# Patient Record
Sex: Female | Born: 1950 | Race: White | Hispanic: No
Health system: Midwestern US, Community
[De-identification: ages and names within clinical notes are randomized; demographics above are authoritative.]

## PROBLEM LIST (undated history)

## (undated) DIAGNOSIS — E785 Hyperlipidemia, unspecified: Secondary | ICD-10-CM

## (undated) DIAGNOSIS — B019 Varicella without complication: Secondary | ICD-10-CM

## (undated) DIAGNOSIS — T4145XA Adverse effect of unspecified anesthetic, initial encounter: Secondary | ICD-10-CM

## (undated) DIAGNOSIS — M1990 Unspecified osteoarthritis, unspecified site: Secondary | ICD-10-CM

## (undated) DIAGNOSIS — Z8489 Family history of other specified conditions: Secondary | ICD-10-CM

## (undated) DIAGNOSIS — E079 Disorder of thyroid, unspecified: Secondary | ICD-10-CM

## (undated) DIAGNOSIS — R011 Cardiac murmur, unspecified: Secondary | ICD-10-CM

## (undated) DIAGNOSIS — C801 Malignant (primary) neoplasm, unspecified: Secondary | ICD-10-CM

## (undated) DIAGNOSIS — I639 Cerebral infarction, unspecified: Secondary | ICD-10-CM

## (undated) DIAGNOSIS — E039 Hypothyroidism, unspecified: Secondary | ICD-10-CM

## (undated) DIAGNOSIS — Z91018 Allergy to other foods: Secondary | ICD-10-CM

## (undated) DIAGNOSIS — A630 Anogenital (venereal) warts: Secondary | ICD-10-CM

## (undated) DIAGNOSIS — E119 Type 2 diabetes mellitus without complications: Secondary | ICD-10-CM

## (undated) DIAGNOSIS — T8859XA Other complications of anesthesia, initial encounter: Secondary | ICD-10-CM

## (undated) DIAGNOSIS — I10 Essential (primary) hypertension: Secondary | ICD-10-CM

## (undated) DIAGNOSIS — Z95818 Presence of other cardiac implants and grafts: Secondary | ICD-10-CM

## (undated) DIAGNOSIS — E041 Nontoxic single thyroid nodule: Secondary | ICD-10-CM

## (undated) DIAGNOSIS — Z9109 Other allergy status, other than to drugs and biological substances: Secondary | ICD-10-CM

## (undated) HISTORY — DX: Unspecified osteoarthritis, unspecified site: M19.90

## (undated) HISTORY — DX: Cardiac murmur, unspecified: R01.1

## (undated) HISTORY — DX: Allergy to other foods: Z91.018

## (undated) HISTORY — PX: CARPAL TUNNEL RELEASE: SHX101

## (undated) HISTORY — DX: Essential (primary) hypertension: I10

## (undated) HISTORY — DX: Nontoxic single thyroid nodule: E04.1

## (undated) HISTORY — DX: Disorder of thyroid, unspecified: E07.9

## (undated) HISTORY — DX: Hyperlipidemia, unspecified: E78.5

## (undated) HISTORY — DX: Anogenital (venereal) warts: A63.0

## (undated) HISTORY — DX: Varicella without complication: B01.9

## (undated) HISTORY — DX: Other allergy status, other than to drugs and biological substances: Z91.09

## (undated) HISTORY — PX: FRACTURE SURGERY: SHX138

## (undated) HISTORY — PX: CYSTECTOMY: SUR359

---

## 2011-12-27 LAB — HM MAMMOGRAPHY

## 2013-07-16 ENCOUNTER — Encounter: Payer: Self-pay | Admitting: Family Medicine

## 2013-07-16 ENCOUNTER — Ambulatory Visit (INDEPENDENT_AMBULATORY_CARE_PROVIDER_SITE_OTHER): Payer: 59 | Admitting: Family Medicine

## 2013-07-16 VITALS — BP 118/74 | HR 71 | Temp 98.7°F | Ht 61.25 in | Wt 181.0 lb

## 2013-07-16 DIAGNOSIS — M899 Disorder of bone, unspecified: Secondary | ICD-10-CM

## 2013-07-16 DIAGNOSIS — Z1239 Encounter for other screening for malignant neoplasm of breast: Secondary | ICD-10-CM

## 2013-07-16 DIAGNOSIS — E039 Hypothyroidism, unspecified: Secondary | ICD-10-CM

## 2013-07-16 DIAGNOSIS — Z0000 Encounter for general adult medical examination without abnormal findings: Secondary | ICD-10-CM

## 2013-07-16 DIAGNOSIS — J309 Allergic rhinitis, unspecified: Secondary | ICD-10-CM

## 2013-07-16 DIAGNOSIS — M8580 Other specified disorders of bone density and structure, unspecified site: Secondary | ICD-10-CM | POA: Insufficient documentation

## 2013-07-16 DIAGNOSIS — J302 Other seasonal allergic rhinitis: Secondary | ICD-10-CM

## 2013-07-16 DIAGNOSIS — N390 Urinary tract infection, site not specified: Secondary | ICD-10-CM

## 2013-07-16 DIAGNOSIS — Z1231 Encounter for screening mammogram for malignant neoplasm of breast: Secondary | ICD-10-CM

## 2013-07-16 DIAGNOSIS — E669 Obesity, unspecified: Secondary | ICD-10-CM

## 2013-07-16 DIAGNOSIS — Z9109 Other allergy status, other than to drugs and biological substances: Secondary | ICD-10-CM

## 2013-07-16 DIAGNOSIS — I10 Essential (primary) hypertension: Secondary | ICD-10-CM | POA: Insufficient documentation

## 2013-07-16 DIAGNOSIS — Z2911 Encounter for prophylactic immunotherapy for respiratory syncytial virus (RSV): Secondary | ICD-10-CM

## 2013-07-16 LAB — CBC WITH DIFFERENTIAL/PLATELET
Basophils Absolute: 0.1 10*3/uL (ref 0.0–0.1)
HCT: 40.5 % (ref 36.0–46.0)
Lymphs Abs: 2.5 10*3/uL (ref 0.7–4.0)
MCV: 86.3 fl (ref 78.0–100.0)
Monocytes Absolute: 0.5 10*3/uL (ref 0.1–1.0)
Neutro Abs: 4.9 10*3/uL (ref 1.4–7.7)
Platelets: 307 10*3/uL (ref 150.0–400.0)
RDW: 13.4 % (ref 11.5–14.6)

## 2013-07-16 LAB — MICROALBUMIN / CREATININE URINE RATIO
Creatinine,U: 72.2 mg/dL
Microalb Creat Ratio: 1.2 mg/g (ref 0.0–30.0)

## 2013-07-16 LAB — BASIC METABOLIC PANEL
BUN: 15 mg/dL (ref 6–23)
CO2: 29 mEq/L (ref 19–32)
Chloride: 104 mEq/L (ref 96–112)
Glucose, Bld: 86 mg/dL (ref 70–99)
Potassium: 3.9 mEq/L (ref 3.5–5.1)

## 2013-07-16 LAB — HEPATIC FUNCTION PANEL
ALT: 17 U/L (ref 0–35)
Albumin: 4.3 g/dL (ref 3.5–5.2)
Total Bilirubin: 0.5 mg/dL (ref 0.3–1.2)

## 2013-07-16 LAB — POCT URINALYSIS DIPSTICK
Bilirubin, UA: NEGATIVE
Nitrite, UA: POSITIVE
Spec Grav, UA: 1.015
pH, UA: 6

## 2013-07-16 LAB — LIPID PANEL
Cholesterol: 302 mg/dL — ABNORMAL HIGH (ref 0–200)
Total CHOL/HDL Ratio: 5
Triglycerides: 130 mg/dL (ref 0.0–149.0)

## 2013-07-16 LAB — LDL CHOLESTEROL, DIRECT: Direct LDL: 206.5 mg/dL

## 2013-07-16 LAB — TSH: TSH: 1.2 u[IU]/mL (ref 0.35–5.50)

## 2013-07-16 MED ORDER — AZELASTINE-FLUTICASONE 137-50 MCG/ACT NA SUSP: 1.0000 | Freq: Two times a day (BID) | NASAL | Status: DC

## 2013-07-16 MED ORDER — AZELASTINE HCL 0.1 % NA SOLN: 1.0000 | Freq: Two times a day (BID) | NASAL | Status: DC

## 2013-07-16 MED ORDER — LEVOTHYROXINE SODIUM 88 MCG PO TABS: 88.0000 ug | ORAL_TABLET | Freq: Every day | ORAL | Status: DC

## 2013-07-16 MED ORDER — FLUTICASONE PROPIONATE 50 MCG/ACT NA SUSP: 2.0000 | Freq: Every day | NASAL | Status: DC

## 2013-07-16 MED ORDER — VALSARTAN 320 MG PO TABS: 320.0000 mg | ORAL_TABLET | Freq: Every day | ORAL | Status: DC

## 2013-07-16 NOTE — Assessment & Plan Note (Signed)
Check labs con't synthroid 

## 2013-07-16 NOTE — Assessment & Plan Note (Signed)
Discussed with diet and exercise

## 2013-07-16 NOTE — Progress Notes (Signed)
Subjective:     [name] is a 62 y.o. female and is here for a comprehensive physical exam. The patient reports no problems.  History   Social History  . Marital Status: Married    Spouse Name: N/A    Number of Children: N/A  . Years of Education: N/A   Occupational History  . Not on file.   Social History Main Topics  . Smoking status: Never Smoker   . Smokeless tobacco: Never Used  . Alcohol Use: Yes     Comment: Occ  . Drug Use: No  . Sexually Active: Yes -- Female partner(s)   Other Topics Concern  . Not on file   Social History Narrative   Exercise-- walk-- 4-7 x a week   No health maintenance topics applied.  The following portions of the patient's history were reviewed and updated as appropriate:  She  has a past medical history of Arthritis; Chickenpox; Genital warts; Environmental allergies; Heart murmur; HTN (hypertension); Hyperlipidemia; Thyroid disease; and Thyroid nodule. She  does not have any pertinent problems on file. She  has past surgical history that includes Carpal tunnel release and Cystectomy. Her family history includes Arthritis in her mother; Diabetes in her mother; Heart disease in her father; High blood pressure in her father and mother; Hyperlipidemia in her mother; Myasthenia gravis in her mother; and Stroke in her father. She  reports that she has never smoked. She has never used smokeless tobacco. She reports that  drinks alcohol. She reports that she does not use illicit drugs. She has a current medication list which includes the following prescription(s): alendronate, azelastine, azelastine-fluticasone, clocortolone pivalate, fluticasone, levothyroxine, and valsartan. No current outpatient prescriptions on file prior to visit.   No current facility-administered medications on file prior to visit.   She is allergic to statins..  Review of Systems Review of Systems  Constitutional: Negative for activity change, appetite change and  fatigue.  HENT: Negative for hearing loss, congestion, tinnitus and ear discharge.  dentist q29m Eyes: Negative for visual disturbance (see optho q1y -- vision corrected to 20/20 with glasses).  Respiratory: Negative for cough, chest tightness and shortness of breath.   Cardiovascular: Negative for chest pain, palpitations and leg swelling.  Gastrointestinal: Negative for abdominal pain, diarrhea, constipation and abdominal distention.  Genitourinary: Negative for urgency, frequency, decreased urine volume and difficulty urinating.  Musculoskeletal: Negative for back pain, arthralgias and gait problem.  Skin: Negative for color change, pallor and rash.  Neurological: Negative for dizziness, light-headedness, numbness and headaches.  Hematological: Negative for adenopathy. Does not bruise/bleed easily.  Psychiatric/Behavioral: Negative for suicidal ideas, confusion, sleep disturbance, self-injury, dysphoric mood, decreased concentration and agitation.       Objective:    BP 118/74  Pulse 71  Temp(Src) 98.7 F (37.1 C) (Oral)  Ht 5' 1.25" (1.556 m)  Wt 181 lb (82.101 kg)  BMI 33.91 kg/m2  SpO2 98% General appearance: alert, cooperative, appears stated age and no distress Head: Normocephalic, without obvious abnormality, atraumatic Eyes: conjunctivae/corneas clear. PERRL, EOM's intact. Fundi benign. Ears: normal TM's and external ear canals both ears Nose: Nares normal. Septum midline. Mucosa normal. No drainage or sinus tenderness. Throat: lips, mucosa, and tongue normal; teeth and gums normal Neck: no adenopathy, no carotid bruit, no JVD, supple, symmetrical, trachea midline and thyroid not enlarged, symmetric, no tenderness/mass/nodules Back: symmetric, no curvature. ROM normal. No CVA tenderness. Lungs: clear to auscultation bilaterally Breasts: normal appearance, no masses or tenderness Heart: regular rate and rhythm,  S1, S2 normal, no murmur, click, rub or gallop Abdomen:  soft, non-tender; bowel sounds normal; no masses,  no organomegaly Pelvic: deferred Extremities: extremities normal, atraumatic, no cyanosis or edema Pulses: 2+ and symmetric Skin: Skin color, texture, turgor normal. No rashes or lesions Lymph nodes: Cervical, supraclavicular, and axillary nodes normal. Neurologic: Alert and oriented X 3, normal strength and tone. Normal symmetric reflexes. Normal coordination and gait Psych-- no depression, no anxiety      Assessment:    Healthy female exam.      Plan:    check labs ghm utd See After Visit Summary for Counseling Recommendations

## 2013-07-16 NOTE — Patient Instructions (Signed)
Preventive Care for Adults, Female A healthy lifestyle and preventive care can promote health and wellness. Preventive health guidelines for women include the following key practices.  A routine yearly physical is a good way to check with your caregiver about your health and preventive screening. It is a chance to share any concerns and updates on your health, and to receive a thorough exam.  Visit your dentist for a routine exam and preventive care every 6 months. Brush your teeth twice a day and floss once a day. Good oral hygiene prevents tooth decay and gum disease.  The frequency of eye exams is based on your age, health, family medical history, use of contact lenses, and other factors. Follow your caregiver's recommendations for frequency of eye exams.  Eat a healthy diet. Foods like vegetables, fruits, whole grains, low-fat dairy products, and lean protein foods contain the nutrients you need without too many calories. Decrease your intake of foods high in solid fats, added sugars, and salt. Eat the right amount of calories for you.Get information about a proper diet from your caregiver, if necessary.  Regular physical exercise is one of the most important things you can do for your health. Most adults should get at least 150 minutes of moderate-intensity exercise (any activity that increases your heart rate and causes you to sweat) each week. In addition, most adults need muscle-strengthening exercises on 2 or more days a week.  Maintain a healthy weight. The body mass index (BMI) is a screening tool to identify possible weight problems. It provides an estimate of body fat based on height and weight. Your caregiver can help determine your BMI, and can help you achieve or maintain a healthy weight.For adults 20 years and older:  A BMI below 18.5 is considered underweight.  A BMI of 18.5 to 24.9 is normal.  A BMI of 25 to 29.9 is considered overweight.  A BMI of 30 and above is  considered obese.  Maintain normal blood lipids and cholesterol levels by exercising and minimizing your intake of saturated fat. Eat a balanced diet with plenty of fruit and vegetables. Blood tests for lipids and cholesterol should begin at age 20 and be repeated every 5 years. If your lipid or cholesterol levels are high, you are over 50, or you are at high risk for heart disease, you may need your cholesterol levels checked more frequently.Ongoing high lipid and cholesterol levels should be treated with medicines if diet and exercise are not effective.  If you smoke, find out from your caregiver how to quit. If you do not use tobacco, do not start.  If you are pregnant, do not drink alcohol. If you are breastfeeding, be very cautious about drinking alcohol. If you are not pregnant and choose to drink alcohol, do not exceed 1 drink per day. One drink is considered to be 12 ounces (355 mL) of beer, 5 ounces (148 mL) of wine, or 1.5 ounces (44 mL) of liquor.  Avoid use of street drugs. Do not share needles with anyone. Ask for help if you need support or instructions about stopping the use of drugs.  High blood pressure causes heart disease and increases the risk of stroke. Your blood pressure should be checked at least every 1 to 2 years. Ongoing high blood pressure should be treated with medicines if weight loss and exercise are not effective.  If you are 55 to 61 years old, ask your caregiver if you should take aspirin to prevent strokes.  Diabetes   screening involves taking a blood sample to check your fasting blood sugar level. This should be done once every 3 years, after age 45, if you are within normal weight and without risk factors for diabetes. Testing should be considered at a younger age or be carried out more frequently if you are overweight and have at least 1 risk factor for diabetes.  Breast cancer screening is essential preventive care for women. You should practice "breast  self-awareness." This means understanding the normal appearance and feel of your breasts and may include breast self-examination. Any changes detected, no matter how small, should be reported to a caregiver. Women in their 20s and 30s should have a clinical breast exam (CBE) by a caregiver as part of a regular health exam every 1 to 3 years. After age 40, women should have a CBE every year. Starting at age 40, women should consider having a mammography (breast X-ray test) every year. Women who have a family history of breast cancer should talk to their caregiver about genetic screening. Women at a high risk of breast cancer should talk to their caregivers about having magnetic resonance imaging (MRI) and a mammography every year.  The Pap test is a screening test for cervical cancer. A Pap test can show cell changes on the cervix that might become cervical cancer if left untreated. A Pap test is a procedure in which cells are obtained and examined from the lower end of the uterus (cervix).  Women should have a Pap test starting at age 21.  Between ages 21 and 29, Pap tests should be repeated every 2 years.  Beginning at age 30, you should have a Pap test every 3 years as long as the past 3 Pap tests have been normal.  Some women have medical problems that increase the chance of getting cervical cancer. Talk to your caregiver about these problems. It is especially important to talk to your caregiver if a new problem develops soon after your last Pap test. In these cases, your caregiver may recommend more frequent screening and Pap tests.  The above recommendations are the same for women who have or have not gotten the vaccine for human papillomavirus (HPV).  If you had a hysterectomy for a problem that was not cancer or a condition that could lead to cancer, then you no longer need Pap tests. Even if you no longer need a Pap test, a regular exam is a good idea to make sure no other problems are  starting.  If you are between ages 65 and 70, and you have had normal Pap tests going back 10 years, you no longer need Pap tests. Even if you no longer need a Pap test, a regular exam is a good idea to make sure no other problems are starting.  If you have had past treatment for cervical cancer or a condition that could lead to cancer, you need Pap tests and screening for cancer for at least 20 years after your treatment.  If Pap tests have been discontinued, risk factors (such as a new sexual partner) need to be reassessed to determine if screening should be resumed.  The HPV test is an additional test that may be used for cervical cancer screening. The HPV test looks for the virus that can cause the cell changes on the cervix. The cells collected during the Pap test can be tested for HPV. The HPV test could be used to screen women aged 30 years and older, and should   be used in women of any age who have unclear Pap test results. After the age of 30, women should have HPV testing at the same frequency as a Pap test.  Colorectal cancer can be detected and often prevented. Most routine colorectal cancer screening begins at the age of 50 and continues through age 75. However, your caregiver may recommend screening at an earlier age if you have risk factors for colon cancer. On a yearly basis, your caregiver may provide home test kits to check for hidden blood in the stool. Use of a small camera at the end of a tube, to directly examine the colon (sigmoidoscopy or colonoscopy), can detect the earliest forms of colorectal cancer. Talk to your caregiver about this at age 50, when routine screening begins. Direct examination of the colon should be repeated every 5 to 10 years through age 75, unless early forms of pre-cancerous polyps or small growths are found.  Hepatitis C blood testing is recommended for all people born from 1945 through 1965 and any individual with known risks for hepatitis C.  Practice  safe sex. Use condoms and avoid high-risk sexual practices to reduce the spread of sexually transmitted infections (STIs). STIs include gonorrhea, chlamydia, syphilis, trichomonas, herpes, HPV, and human immunodeficiency virus (HIV). Herpes, HIV, and HPV are viral illnesses that have no cure. They can result in disability, cancer, and death. Sexually active women aged 25 and younger should be checked for chlamydia. Older women with new or multiple partners should also be tested for chlamydia. Testing for other STIs is recommended if you are sexually active and at increased risk.  Osteoporosis is a disease in which the bones lose minerals and strength with aging. This can result in serious bone fractures. The risk of osteoporosis can be identified using a bone density scan. Women ages 65 and over and women at risk for fractures or osteoporosis should discuss screening with their caregivers. Ask your caregiver whether you should take a calcium supplement or vitamin D to reduce the rate of osteoporosis.  Menopause can be associated with physical symptoms and risks. Hormone replacement therapy is available to decrease symptoms and risks. You should talk to your caregiver about whether hormone replacement therapy is right for you.  Use sunscreen with sun protection factor (SPF) of 30 or more. Apply sunscreen liberally and repeatedly throughout the day. You should seek shade when your shadow is shorter than you. Protect yourself by wearing long sleeves, pants, a wide-brimmed hat, and sunglasses year round, whenever you are outdoors.  Once a month, do a whole body skin exam, using a mirror to look at the skin on your back. Notify your caregiver of new moles, moles that have irregular borders, moles that are larger than a pencil eraser, or moles that have changed in shape or color.  Stay current with required immunizations.  Influenza. You need a dose every fall (or winter). The composition of the flu vaccine  changes each year, so being vaccinated once is not enough.  Pneumococcal polysaccharide. You need 1 to 2 doses if you smoke cigarettes or if you have certain chronic medical conditions. You need 1 dose at age 65 (or older) if you have never been vaccinated.  Tetanus, diphtheria, pertussis (Tdap, Td). Get 1 dose of Tdap vaccine if you are younger than age 65, are over 65 and have contact with an infant, are a healthcare worker, are pregnant, or simply want to be protected from whooping cough. After that, you need a Td   booster dose every 10 years. Consult your caregiver if you have not had at least 3 tetanus and diphtheria-containing shots sometime in your life or have a deep or dirty wound.  HPV. You need this vaccine if you are a woman age 26 or younger. The vaccine is given in 3 doses over 6 months.  Measles, mumps, rubella (MMR). You need at least 1 dose of MMR if you were born in 1957 or later. You may also need a second dose.  Meningococcal. If you are age 19 to 21 and a first-year college student living in a residence hall, or have one of several medical conditions, you need to get vaccinated against meningococcal disease. You may also need additional booster doses.  Zoster (shingles). If you are age 60 or older, you should get this vaccine.  Varicella (chickenpox). If you have never had chickenpox or you were vaccinated but received only 1 dose, talk to your caregiver to find out if you need this vaccine.  Hepatitis A. You need this vaccine if you have a specific risk factor for hepatitis A virus infection or you simply wish to be protected from this disease. The vaccine is usually given as 2 doses, 6 to 18 months apart.  Hepatitis B. You need this vaccine if you have a specific risk factor for hepatitis B virus infection or you simply wish to be protected from this disease. The vaccine is given in 3 doses, usually over 6 months. Preventive Services / Frequency Ages 19 to 39  Blood  pressure check.** / Every 1 to 2 years.  Lipid and cholesterol check.** / Every 5 years beginning at age 20.  Clinical breast exam.** / Every 3 years for women in their 20s and 30s.  Pap test.** / Every 2 years from ages 21 through 29. Every 3 years starting at age 30 through age 65 or 70 with a history of 3 consecutive normal Pap tests.  HPV screening.** / Every 3 years from ages 30 through ages 65 to 70 with a history of 3 consecutive normal Pap tests.  Hepatitis C blood test.** / For any individual with known risks for hepatitis C.  Skin self-exam. / Monthly.  Influenza immunization.** / Every year.  Pneumococcal polysaccharide immunization.** / 1 to 2 doses if you smoke cigarettes or if you have certain chronic medical conditions.  Tetanus, diphtheria, pertussis (Tdap, Td) immunization. / A one-time dose of Tdap vaccine. After that, you need a Td booster dose every 10 years.  HPV immunization. / 3 doses over 6 months, if you are 26 and younger.  Measles, mumps, rubella (MMR) immunization. / You need at least 1 dose of MMR if you were born in 1957 or later. You may also need a second dose.  Meningococcal immunization. / 1 dose if you are age 19 to 21 and a first-year college student living in a residence hall, or have one of several medical conditions, you need to get vaccinated against meningococcal disease. You may also need additional booster doses.  Varicella immunization.** / Consult your caregiver.  Hepatitis A immunization.** / Consult your caregiver. 2 doses, 6 to 18 months apart.  Hepatitis B immunization.** / Consult your caregiver. 3 doses usually over 6 months. Ages 40 to 64  Blood pressure check.** / Every 1 to 2 years.  Lipid and cholesterol check.** / Every 5 years beginning at age 20.  Clinical breast exam.** / Every year after age 40.  Mammogram.** / Every year beginning at age 40   and continuing for as long as you are in good health. Consult with your  caregiver.  Pap test.** / Every 3 years starting at age 30 through age 65 or 70 with a history of 3 consecutive normal Pap tests.  HPV screening.** / Every 3 years from ages 30 through ages 65 to 70 with a history of 3 consecutive normal Pap tests.  Fecal occult blood test (FOBT) of stool. / Every year beginning at age 50 and continuing until age 75. You may not need to do this test if you get a colonoscopy every 10 years.  Flexible sigmoidoscopy or colonoscopy.** / Every 5 years for a flexible sigmoidoscopy or every 10 years for a colonoscopy beginning at age 50 and continuing until age 75.  Hepatitis C blood test.** / For all people born from 1945 through 1965 and any individual with known risks for hepatitis C.  Skin self-exam. / Monthly.  Influenza immunization.** / Every year.  Pneumococcal polysaccharide immunization.** / 1 to 2 doses if you smoke cigarettes or if you have certain chronic medical conditions.  Tetanus, diphtheria, pertussis (Tdap, Td) immunization.** / A one-time dose of Tdap vaccine. After that, you need a Td booster dose every 10 years.  Measles, mumps, rubella (MMR) immunization. / You need at least 1 dose of MMR if you were born in 1957 or later. You may also need a second dose.  Varicella immunization.** / Consult your caregiver.  Meningococcal immunization.** / Consult your caregiver.  Hepatitis A immunization.** / Consult your caregiver. 2 doses, 6 to 18 months apart.  Hepatitis B immunization.** / Consult your caregiver. 3 doses, usually over 6 months. Ages 65 and over  Blood pressure check.** / Every 1 to 2 years.  Lipid and cholesterol check.** / Every 5 years beginning at age 20.  Clinical breast exam.** / Every year after age 40.  Mammogram.** / Every year beginning at age 40 and continuing for as long as you are in good health. Consult with your caregiver.  Pap test.** / Every 3 years starting at age 30 through age 65 or 70 with a 3  consecutive normal Pap tests. Testing can be stopped between 65 and 70 with 3 consecutive normal Pap tests and no abnormal Pap or HPV tests in the past 10 years.  HPV screening.** / Every 3 years from ages 30 through ages 65 or 70 with a history of 3 consecutive normal Pap tests. Testing can be stopped between 65 and 70 with 3 consecutive normal Pap tests and no abnormal Pap or HPV tests in the past 10 years.  Fecal occult blood test (FOBT) of stool. / Every year beginning at age 50 and continuing until age 75. You may not need to do this test if you get a colonoscopy every 10 years.  Flexible sigmoidoscopy or colonoscopy.** / Every 5 years for a flexible sigmoidoscopy or every 10 years for a colonoscopy beginning at age 50 and continuing until age 75.  Hepatitis C blood test.** / For all people born from 1945 through 1965 and any individual with known risks for hepatitis C.  Osteoporosis screening.** / A one-time screening for women ages 65 and over and women at risk for fractures or osteoporosis.  Skin self-exam. / Monthly.  Influenza immunization.** / Every year.  Pneumococcal polysaccharide immunization.** / 1 dose at age 65 (or older) if you have never been vaccinated.  Tetanus, diphtheria, pertussis (Tdap, Td) immunization. / A one-time dose of Tdap vaccine if you are over   65 and have contact with an infant, are a healthcare worker, or simply want to be protected from whooping cough. After that, you need a Td booster dose every 10 years.  Varicella immunization.** / Consult your caregiver.  Meningococcal immunization.** / Consult your caregiver.  Hepatitis A immunization.** / Consult your caregiver. 2 doses, 6 to 18 months apart.  Hepatitis B immunization.** / Check with your caregiver. 3 doses, usually over 6 months. ** Family history and personal history of risk and conditions may change your caregiver's recommendations. Document Released: 02/07/2002 Document Revised: 03/05/2012  Document Reviewed: 05/09/2011 [employer] Patient Information 2014 [employer].  

## 2013-07-16 NOTE — Assessment & Plan Note (Signed)
dymista otc antihistamine

## 2013-07-16 NOTE — Assessment & Plan Note (Signed)
Check bmd 

## 2013-07-16 NOTE — Assessment & Plan Note (Signed)
Stable con't meds 

## 2013-07-19 ENCOUNTER — Encounter: Payer: Self-pay | Admitting: Family Medicine

## 2013-07-19 LAB — URINE CULTURE

## 2013-07-22 ENCOUNTER — Telehealth: Payer: Self-pay

## 2013-07-22 MED ORDER — SULFAMETHOXAZOLE-TMP DS 800-160 MG PO TABS: ORAL_TABLET | ORAL | Status: DC

## 2013-07-22 NOTE — Telephone Encounter (Signed)
Rx sent 

## 2013-07-22 NOTE — Progress Notes (Signed)
Notified patient of Urine Culture results. Sent Rx to [facility]

## 2013-07-23 ENCOUNTER — Telehealth: Payer: Self-pay | Admitting: *Deleted

## 2013-07-23 NOTE — Telephone Encounter (Signed)
Spoke with pt and advised to take abx with probiotic or activia yogurt should GI upset occur.

## 2013-08-27 ENCOUNTER — Encounter: Payer: Self-pay | Admitting: Family Medicine

## 2013-09-06 ENCOUNTER — Ambulatory Visit
Admission: RE | Admit: 2013-09-06 | Discharge: 2013-09-06 | Disposition: A | Discharge status: Home / Self Care | Payer: 59 | Source: Ambulatory Visit | Attending: Family Medicine | Admitting: Family Medicine

## 2013-09-06 DIAGNOSIS — M8580 Other specified disorders of bone density and structure, unspecified site: Secondary | ICD-10-CM

## 2013-09-06 DIAGNOSIS — Z1231 Encounter for screening mammogram for malignant neoplasm of breast: Secondary | ICD-10-CM

## 2013-10-05 ENCOUNTER — Encounter: Payer: Self-pay | Admitting: Family Medicine

## 2013-10-14 ENCOUNTER — Telehealth: Payer: Self-pay | Admitting: *Deleted

## 2013-10-14 NOTE — Telephone Encounter (Signed)
Orders faxed      KP 

## 2013-10-14 NOTE — Telephone Encounter (Signed)
Patient called and stated that she was just newly diagnosis with osteoporosis. Pt stated that she was given two options for treatment and she decided to go with prolia. Patient would like to know what is the next step in getting that started. Please advise. SW

## 2013-10-15 DIAGNOSIS — M810 Age-related osteoporosis without current pathological fracture: Secondary | ICD-10-CM | POA: Insufficient documentation

## 2013-10-31 ENCOUNTER — Other Ambulatory Visit: Payer: Self-pay

## 2013-11-01 ENCOUNTER — Ambulatory Visit (INDEPENDENT_AMBULATORY_CARE_PROVIDER_SITE_OTHER): Payer: 59 | Admitting: Family Medicine

## 2013-11-01 ENCOUNTER — Ambulatory Visit (INDEPENDENT_AMBULATORY_CARE_PROVIDER_SITE_OTHER)
Admission: RE | Admit: 2013-11-01 | Discharge: 2013-11-01 | Disposition: A | Discharge status: Home / Self Care | Payer: 59 | Source: Ambulatory Visit | Attending: Family Medicine | Admitting: Family Medicine

## 2013-11-01 ENCOUNTER — Encounter: Payer: Self-pay | Admitting: Family Medicine

## 2013-11-01 VITALS — BP 118/80 | HR 79 | Temp 99.0°F | Wt 190.0 lb

## 2013-11-01 DIAGNOSIS — M25569 Pain in unspecified knee: Secondary | ICD-10-CM

## 2013-11-01 DIAGNOSIS — M25562 Pain in left knee: Secondary | ICD-10-CM

## 2013-11-01 MED ORDER — MELOXICAM 15 MG PO TABS: 15.0000 mg | ORAL_TABLET | Freq: Every day | ORAL | Status: DC

## 2013-11-01 MED ORDER — NONFORMULARY OR COMPOUNDED ITEM: Status: DC

## 2013-11-01 NOTE — Progress Notes (Signed)
  Subjective:    [name] is a 62 y.o. female who presents knee pain and swelling involving the left knee. Onset was sudden, not related to any specific activity. Inciting event: none known. Current symptoms include: swelling. Pain is aggravated by any weight bearing and going up and down stairs. Patient has had no prior knee problems. Evaluation to date: none. Treatment to date: none.  The following portions of the patient's history were reviewed and updated as appropriate: allergies, current medications, past family history, past medical history, past social history, past surgical history and problem list.   Review of Systems Pertinent items are noted in HPI.   Objective:    BP 118/80  Pulse 79  Temp(Src) 99 F (37.2 C) (Oral)  Wt 190 lb (86.183 kg)  SpO2 96% Right knee: normal and no effusion, full active range of motion, no joint line tenderness, ligamentous structures intact.  Left knee:  positive exam findings: effusion and no crepitus and negative exam findings: no erythema + swelling around patella and tenderness with palpation    X-ray left knee: not available    Assessment:    Left knee pain with effusion    Plan:    Natural history and expected course discussed. Questions answered. Rest, ice, compression, and elevation (RICE) therapy. Patellar compression sleeve. NSAIDs per medication orders. Plain film x-rays. Follow up in prn -.

## 2013-11-01 NOTE — Patient Instructions (Addendum)
F/u prn   Knee Effusion The medical term for having fluid in your knee is effusion. This is often due to an internal derangement of the knee. This means something is wrong inside the knee. Some of the causes of fluid in the knee may be torn cartilage, a torn ligament, or bleeding into the joint from an injury. Your knee is likely more difficult to bend and move. This is often because there is increased pain and pressure in the joint. The time it takes for recovery from a knee effusion depends on different factors, including:   Type of injury.  Your age.  Physical and medical conditions.  Rehabilitation Strategies. How long you will be away from your normal activities will depend on what kind of knee problem you have and how much damage is present. Your knee has two types of cartilage. Articular cartilage covers the bone ends and lets your knee bend and move smoothly. Two menisci, thick pads of cartilage that form a rim inside the joint, help absorb shock and stabilize your knee. Ligaments bind the bones together and support your knee joint. Muscles move the joint, help support your knee, and take stress off the joint itself. CAUSES  Often an effusion in the knee is caused by an injury to one of the menisci. This is often a tear in the cartilage. Recovery after a meniscus injury depends on how much meniscus is damaged and whether you have damaged other knee tissue. Small tears may heal on their own with conservative treatment. Conservative means rest, limited weight bearing activity and muscle strengthening exercises. Your recovery may take up to 6 weeks.  TREATMENT  Larger tears may require surgery. Meniscus injuries may be treated during arthroscopy. Arthroscopy is a procedure in which your surgeon uses a small telescope like instrument to look in your knee. Your caregiver can make a more accurate diagnosis (learning what is wrong) by performing an arthroscopic procedure. If your injury is on the  inner margin of the meniscus, your surgeon may trim the meniscus back to a smooth rim. In other cases your surgeon will try to repair a damaged meniscus with stitches (sutures). This may make rehabilitation take longer, but may provide better long term result by helping your knee keep its shock absorption capabilities. Ligaments which are completely torn usually require surgery for repair. HOME CARE INSTRUCTIONS  Use crutches as instructed.  If a brace is applied, use as directed.  Once you are home, an ice pack applied to your swollen knee may help with discomfort and help decrease swelling.  Keep your knee raised (elevated) when you are not up and around or on crutches.  Only take over-the-counter or prescription medicines for pain, discomfort, or fever as directed by your caregiver.  Your caregivers will help with instructions for rehabilitation of your knee. This often includes strengthening exercises.  You may resume a normal diet and activities as directed. SEEK MEDICAL CARE IF:   There is increased swelling in your knee.  You notice redness, swelling, or increasing pain in your knee.  An unexplained oral temperature above 102 F (38.9 C) develops. SEEK IMMEDIATE MEDICAL CARE IF:   You develop a rash.  You have difficulty breathing.  You have any allergic reactions from medications you may have been given.  There is severe pain with any motion of the knee. MAKE SURE YOU:   Understand these instructions.  Will watch your condition.  Will get help right away if you are not doing  well or get worse. Document Released: 03/03/2004 Document Revised: 03/05/2012 Document Reviewed: 05/07/2008 [facility] Patient Information 2014 [place], [place].

## 2013-11-04 ENCOUNTER — Other Ambulatory Visit: Payer: Self-pay | Admitting: Family Medicine

## 2013-11-04 DIAGNOSIS — M1710 Unilateral primary osteoarthritis, unspecified knee: Secondary | ICD-10-CM

## 2013-11-14 ENCOUNTER — Telehealth: Payer: Self-pay

## 2013-11-14 NOTE — Telephone Encounter (Signed)
[employer] benefits verified and he patient has been approved. Insurance plan requires a 18 dollar co-pay for the service. patient is aware and agreed to injection, apt scheduled for 11/19/13 at 4:30 pm.      KP

## 2013-11-19 ENCOUNTER — Ambulatory Visit (INDEPENDENT_AMBULATORY_CARE_PROVIDER_SITE_OTHER): Payer: 59 | Admitting: *Deleted

## 2013-11-19 DIAGNOSIS — M810 Age-related osteoporosis without current pathological fracture: Secondary | ICD-10-CM

## 2013-11-19 MED ORDER — DENOSUMAB 60 MG/ML ~~LOC~~ SOLN
60.0000 mg | Freq: Once | SUBCUTANEOUS | Status: AC
  Administered 2013-11-19: 60 mg via SUBCUTANEOUS

## 2014-01-01 ENCOUNTER — Encounter: Payer: Self-pay | Admitting: Internal Medicine

## 2014-01-01 ENCOUNTER — Ambulatory Visit (INDEPENDENT_AMBULATORY_CARE_PROVIDER_SITE_OTHER): Payer: 59 | Admitting: Internal Medicine

## 2014-01-01 VITALS — BP 143/82 | HR 74 | Temp 98.1°F | Wt 193.0 lb

## 2014-01-01 DIAGNOSIS — J069 Acute upper respiratory infection, unspecified: Secondary | ICD-10-CM

## 2014-01-01 DIAGNOSIS — R3915 Urgency of urination: Secondary | ICD-10-CM

## 2014-01-01 DIAGNOSIS — N390 Urinary tract infection, site not specified: Secondary | ICD-10-CM

## 2014-01-01 LAB — POCT URINALYSIS DIPSTICK
Bilirubin, UA: NEGATIVE
Glucose, UA: NEGATIVE
Ketones, UA: NEGATIVE
Nitrite, UA: NEGATIVE
Protein, UA: NEGATIVE
RBC UA: NEGATIVE
SPEC GRAV UA: 1.015
UROBILINOGEN UA: NEGATIVE
pH, UA: 5

## 2014-01-01 MED ORDER — SULFAMETHOXAZOLE-TMP DS 800-160 MG PO TABS: 1.0000 | ORAL_TABLET | Freq: Two times a day (BID) | ORAL | Status: DC

## 2014-01-01 NOTE — Progress Notes (Signed)
Pre visit review using our clinic review tool, if applicable. No additional management support is needed unless otherwise documented below in the visit note. 

## 2014-01-01 NOTE — Progress Notes (Signed)
   Subjective:    Patient ID: [name], female    DOB: 1951-04-21, 63 y.o.   MRN: [id number]  HPI Acute visit Symptoms started last week: Urinary urgency and a strong urinary odor. She denies dysuria, change in the color of the urine. No vaginal discharge or vaginal bleeding. No flank pain.   Also developed a URI 4 days ago:  sinus congestion, frontal headache, sore throat. Denies any cough or myalgias. + Postnasal dripping and hoarseness. No fever or chills.  Past Medical History  Diagnosis Date  . 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 Chickenpox   . Genital warts   . Environmental allergies   . Heart murmur   . HTN (hypertension)   . Hyperlipidemia   . Thyroid disease     Hypothyroid  . Thyroid nodule    Past Surgical History  Procedure Laterality Date  . Carpal tunnel release      Both wrist  . Cystectomy      From the cervix   History  Substance Use Topics  . Smoking status: Never Smoker   . Smokeless tobacco: Never Used  . Alcohol Use: Yes     Comment: Occ    Review of Systems See HPI    Objective:   Physical Exam BP 143/82  Pulse 74  Temp(Src) 98.1 F (36.7 C)  Wt 193 lb (87.544 kg)  SpO2 97% General -- alert, well-developed, NAD.  HEENT-- Not pale. TMs normal, throat symmetric, no redness or discharge. Face symmetric, sinuses not tender to palpation. Nose slt congested.  Lungs -- normal respiratory effort, no intercostal retractions, no accessory muscle use, and normal breath sounds.  Heart-- normal rate, regular rhythm, no murmur.  Abdomen-- Not distended, good bowel sounds,soft, non-tender. No CVA tenderness  Extremities-- no pretibial edema bilaterally  Neurologic--  alert & oriented X3. Speech normal, gait normal, strength normal in all extremities.  Psych-- Cognition and judgment appear intact. Cooperative with normal attention span and concentration. No anxious or depressed appearing.      Assessment & Plan:   UTI, Symptoms consistent with a UTI, Udip supportive. Will get a urine culture and treat with Bactrim.  URI, See instructions

## 2014-01-01 NOTE — Patient Instructions (Signed)
Drink plenty of fluids, take the antibiotic for 3 days, call if urinary symptoms continue  Rest, fluids , tylenol For cough, take Mucinex DM twice a day as needed  For congestion use OTC Nasocort: 2 nasal sprays on each side of the nose daily until you feel better  Call if no better in few days Call anytime if the symptoms are severe

## 2014-01-05 LAB — URINE CULTURE: Colony Count: >=100000

## 2014-05-28 ENCOUNTER — Telehealth: Payer: Self-pay

## 2014-05-28 NOTE — Telephone Encounter (Signed)
Benefits verified and as of 05/22/14 patient is subject to a $275 deductible ($19 met), 10% co-insurance up to a $5000 out of pocket max ($399 met-deductible included). Once coverage is met coverage increases to 100%. If an office visit is billed an $18 co-pay will also apply. No PA is required.  The patient will have to pay $18 when she comes in for her injection. No secondary Insurance. Verification forms sent to be scanned     KP

## 2014-05-30 ENCOUNTER — Ambulatory Visit (INDEPENDENT_AMBULATORY_CARE_PROVIDER_SITE_OTHER): Payer: 59 | Admitting: *Deleted

## 2014-05-30 DIAGNOSIS — M810 Age-related osteoporosis without current pathological fracture: Secondary | ICD-10-CM

## 2014-05-30 MED ORDER — DENOSUMAB 60 MG/ML ~~LOC~~ SOLN
60.0000 mg | Freq: Once | SUBCUTANEOUS | Status: AC
  Administered 2014-05-30: 60 mg via SUBCUTANEOUS

## 2014-07-08 ENCOUNTER — Ambulatory Visit: Payer: 59 | Admitting: Family Medicine

## 2014-07-10 ENCOUNTER — Ambulatory Visit: Payer: 59 | Admitting: Family Medicine

## 2014-07-31 ENCOUNTER — Other Ambulatory Visit: Payer: Self-pay | Admitting: Family Medicine

## 2014-08-04 ENCOUNTER — Ambulatory Visit (INDEPENDENT_AMBULATORY_CARE_PROVIDER_SITE_OTHER): Payer: 59 | Admitting: Family Medicine

## 2014-08-04 ENCOUNTER — Encounter: Payer: Self-pay | Admitting: Family Medicine

## 2014-08-04 ENCOUNTER — Other Ambulatory Visit (HOSPITAL_COMMUNITY)
Admission: RE | Admit: 2014-08-04 | Discharge: 2014-08-04 | Disposition: A | Discharge status: Home / Self Care | Payer: 59 | Source: Ambulatory Visit | Attending: Family Medicine | Admitting: Family Medicine

## 2014-08-04 VITALS — BP 132/76 | Temp 98.5°F | Resp 16 | Ht 61.5 in | Wt 191.8 lb

## 2014-08-04 DIAGNOSIS — E039 Hypothyroidism, unspecified: Secondary | ICD-10-CM

## 2014-08-04 DIAGNOSIS — R82998 Other abnormal findings in urine: Secondary | ICD-10-CM

## 2014-08-04 DIAGNOSIS — J302 Other seasonal allergic rhinitis: Secondary | ICD-10-CM

## 2014-08-04 DIAGNOSIS — M898X9 Other specified disorders of bone, unspecified site: Secondary | ICD-10-CM

## 2014-08-04 DIAGNOSIS — J309 Allergic rhinitis, unspecified: Secondary | ICD-10-CM

## 2014-08-04 DIAGNOSIS — M779 Enthesopathy, unspecified: Secondary | ICD-10-CM

## 2014-08-04 DIAGNOSIS — Z124 Encounter for screening for malignant neoplasm of cervix: Secondary | ICD-10-CM

## 2014-08-04 DIAGNOSIS — D229 Melanocytic nevi, unspecified: Secondary | ICD-10-CM

## 2014-08-04 DIAGNOSIS — Z8639 Personal history of other endocrine, nutritional and metabolic disease: Secondary | ICD-10-CM

## 2014-08-04 DIAGNOSIS — D485 Neoplasm of uncertain behavior of skin: Secondary | ICD-10-CM

## 2014-08-04 DIAGNOSIS — Z1151 Encounter for screening for human papillomavirus (HPV): Secondary | ICD-10-CM | POA: Insufficient documentation

## 2014-08-04 DIAGNOSIS — Z0000 Encounter for general adult medical examination without abnormal findings: Secondary | ICD-10-CM

## 2014-08-04 DIAGNOSIS — I10 Essential (primary) hypertension: Secondary | ICD-10-CM

## 2014-08-04 DIAGNOSIS — Z862 Personal history of diseases of the blood and blood-forming organs and certain disorders involving the immune mechanism: Secondary | ICD-10-CM

## 2014-08-04 LAB — POCT URINALYSIS DIPSTICK
Bilirubin, UA: NEGATIVE
Glucose, UA: NEGATIVE
KETONES UA: NEGATIVE
Nitrite, UA: NEGATIVE
PH UA: 5
PROTEIN UA: NEGATIVE
Urobilinogen, UA: 0.2

## 2014-08-04 MED ORDER — FLUTICASONE PROPIONATE 50 MCG/ACT NA SUSP: 2.0000 | Freq: Every day | NASAL | Status: DC

## 2014-08-04 MED ORDER — LEVOTHYROXINE SODIUM 88 MCG PO TABS: 88.0000 ug | ORAL_TABLET | Freq: Every day | ORAL | Status: DC

## 2014-08-04 MED ORDER — AZELASTINE HCL 0.1 % NA SOLN: 1.0000 | Freq: Two times a day (BID) | NASAL | Status: AC

## 2014-08-04 MED ORDER — VALSARTAN 320 MG PO TABS: ORAL_TABLET | ORAL | Status: DC

## 2014-08-04 NOTE — Progress Notes (Signed)
Pre visit review using our clinic review tool, if applicable. No additional management support is needed unless otherwise documented below in the visit note. 

## 2014-08-04 NOTE — Progress Notes (Signed)
Subjective:     [name] is a 63 y.o. female and is here for a comprehensive physical exam. The patient reports no problems.  History   Social History  . Marital Status: Married    Spouse Name: N/A    Number of Children: N/A  . Years of Education: N/A   Occupational History  . Not on file.   Social History Main Topics  . Smoking status: Never Smoker   . Smokeless tobacco: Never Used  . Alcohol Use: Yes     Comment: Occ  . Drug Use: No  . Sexual Activity: Yes    Partners: Male   Other Topics Concern  . Not on file   Social History Narrative   Exercise-- walk-- 4-7 x a week   Health Maintenance  Topic Date Due  . Influenza Vaccine  10/04/2014 (Originally 07/26/2014)  . Pap Smear  07/27/2015  . Mammogram  09/07/2015  . Colonoscopy  12/26/2018  . Tetanus/tdap  07/16/2020  . Zostavax  Completed    The following portions of the patient's history were reviewed and updated as appropriate:  She  has a past medical history of Arthritis; Chickenpox; Genital warts; Environmental allergies; Heart murmur; HTN (hypertension); Hyperlipidemia; Thyroid disease; and Thyroid nodule. She  does not have any pertinent problems on file. She  has past surgical history that includes Carpal tunnel release and Cystectomy. Her family history includes Arthritis in her mother; Diabetes in her mother; Heart disease in her father; High blood pressure in her father and mother; Hyperlipidemia in her mother; Myasthenia gravis in her mother; Stroke in her father. She  reports that she has never smoked. She has never used smokeless tobacco. She reports that she drinks alcohol. She reports that she does not use illicit drugs. She has a current medication list which includes the following prescription(s): azelastine, clocortolone pivalate, fluticasone, levothyroxine, NONFORMULARY OR COMPOUNDED ITEM, and valsartan. Current Outpatient Prescriptions on File Prior to Visit  Medication Sig Dispense Refill   . Clocortolone Pivalate (CLODERM) 0.1 % cream Apply 1 application topically 2 (two) times daily.      . NONFORMULARY OR COMPOUNDED ITEM Knee sleeve #1  As directed  1 each  0   No current facility-administered medications on file prior to visit.   She is allergic to statins..  Review of Systems Review of Systems  Constitutional: Negative for activity change, appetite change and fatigue.  HENT: Negative for hearing loss, congestion, tinnitus and ear discharge.  dentist q66m Eyes: Negative for visual disturbance (see optho q1y -- vision corrected to 20/20 with glasses).  Respiratory: Negative for cough, chest tightness and shortness of breath.   Cardiovascular: Negative for chest pain, palpitations and leg swelling.  Gastrointestinal: Negative for abdominal pain, diarrhea, constipation and abdominal distention.  Genitourinary: Negative for urgency, frequency, decreased urine volume and difficulty urinating.  Musculoskeletal: Negative for back pain, arthralgias and gait problem.  Skin: Negative for color change, pallor and rash.  Neurological: Negative for dizziness, light-headedness, numbness and headaches.  Hematological: Negative for adenopathy. Does not bruise/bleed easily.  Psychiatric/Behavioral: Negative for suicidal ideas, confusion, sleep disturbance, self-injury, dysphoric mood, decreased concentration and agitation.       Objective:    BP 132/76  Temp(Src) 98.5 F (36.9 C) (Oral)  Resp 16  Ht 5' 1.5" (1.562 m)  Wt 191 lb 12.8 oz (87 kg)  BMI 35.66 kg/m2 General appearance: alert, cooperative, appears stated age and no distress Head: Normocephalic, without obvious abnormality, atraumatic Eyes: conjunctivae/corneas clear. PERRL,  EOM's intact. Fundi benign. Ears: normal TM's and external ear canals both ears Nose: Nares normal. Septum midline. Mucosa normal. No drainage or sinus tenderness. Throat: lips, mucosa, and tongue normal; teeth and gums normal Neck: no  adenopathy, no carotid bruit, no JVD, supple, symmetrical, trachea midline and thyroid not enlarged, symmetric, no tenderness/mass/nodules Back: symmetric, no curvature. ROM normal. No CVA tenderness. Lungs: clear to auscultation bilaterally Breasts: normal appearance, no masses or tenderness Heart: regular rate and rhythm, S1, S2 normal, no murmur, click, rub or gallop Abdomen: soft, non-tender; bowel sounds normal; no masses,  no organomegaly Pelvic: cervix normal in appearance, external genitalia normal, no adnexal masses or tenderness, no cervical motion tenderness, rectovaginal septum normal, uterus normal size, shape, and consistency, vagina normal without discharge and pap done Extremities: bone spur per podiatry L foot big toe Pulses: 2+ and symmetric Skin: Skin color, texture, turgor normal. No rashes or lesions---  Mole under L breast about the size of a dime--  ? SK Lymph nodes: Cervical, supraclavicular, and axillary nodes normal. Neurologic: Alert and oriented X 3, normal strength and tone. Normal symmetric reflexes. Normal coordination and gait Psych-- no depression, no anxiety      Assessment:    Healthy female exam.       Plan:    ghm utd Check labs See After Visit Summary for Counseling Recommendations   1. Routine general medical examination at a health care facility  - POCT Urinalysis Dipstick  2. Leukocytes in urine  - Urine Culture  3. H/O thyroid nodule  - US Soft Tissue Head/Neck; Future  4. Unspecified hypothyroidism  - levothyroxine (SYNTHROID) 88 MCG tablet; Take 1 tablet (88 mcg total) by mouth daily.  Dispense: 90 tablet; Refill: 3 - TSH  5. Seasonal allergies  - fluticasone (FLONASE) 50 MCG/ACT nasal spray; Place 2 sprays into both nostrils daily.  Dispense: 48 g; Refill: 3 - azelastine (ASTELIN) 0.1 % nasal spray; Place 1 spray into both nostrils 2 (two) times daily. Use in each nostril as directed  Dispense: 90 mL; Refill: 3  6. Essential  hypertension Stable, con' tmeds - valsartan (DIOVAN) 320 MG tablet; TAKE 1 TABLET DAILY  Dispense: 90 tablet; Refill: 3 - Basic metabolic panel - CBC with Differential - Hepatic function panel - Lipid panel - POCT urinalysis dipstick  7. Preventative health care   - Basic metabolic panel - CBC with Differential - Hepatic function panel - Lipid panel - POCT urinalysis dipstick - TSH - Cytology - PAP  8. Bone spur   - Ambulatory referral to Podiatry  9. Suspicious nevus   - Ambulatory referral to Dermatology  10. Screening for malignant neoplasm of the cervix   - Cytology - PAP

## 2014-08-04 NOTE — Patient Instructions (Signed)
Preventive Care for Adults A healthy lifestyle and preventive care can promote health and wellness. Preventive health guidelines for women include the following key practices.  A routine yearly physical is a good way to check with your health care provider about your health and preventive screening. It is a chance to share any concerns and updates on your health and to receive a thorough exam.  Visit your dentist for a routine exam and preventive care every 6 months. Brush your teeth twice a day and floss once a day. Good oral hygiene prevents tooth decay and gum disease.  The frequency of eye exams is based on your age, health, family medical history, use of contact lenses, and other factors. Follow your health care provider's recommendations for frequency of eye exams.  Eat a healthy diet. Foods like vegetables, fruits, whole grains, low-fat dairy products, and lean protein foods contain the nutrients you need without too many calories. Decrease your intake of foods high in solid fats, added sugars, and salt. Eat the right amount of calories for you.Get information about a proper diet from your health care provider, if necessary.  Regular physical exercise is one of the most important things you can do for your health. Most adults should get at least 150 minutes of moderate-intensity exercise (any activity that increases your heart rate and causes you to sweat) each week. In addition, most adults need muscle-strengthening exercises on 2 or more days a week.  Maintain a healthy weight. The body mass index (BMI) is a screening tool to identify possible weight problems. It provides an estimate of body fat based on height and weight. Your health care provider can find your BMI and can help you achieve or maintain a healthy weight.For adults 20 years and older:  A BMI below 18.5 is considered underweight.  A BMI of 18.5 to 24.9 is normal.  A BMI of 25 to 29.9 is considered overweight.  A BMI of  30 and above is considered obese.  Maintain normal blood lipids and cholesterol levels by exercising and minimizing your intake of saturated fat. Eat a balanced diet with plenty of fruit and vegetables. Blood tests for lipids and cholesterol should begin at age 76 and be repeated every 5 years. If your lipid or cholesterol levels are high, you are over 50, or you are at high risk for heart disease, you may need your cholesterol levels checked more frequently.Ongoing high lipid and cholesterol levels should be treated with medicines if diet and exercise are not working.  If you smoke, find out from your health care provider how to quit. If you do not use tobacco, do not start.  Lung cancer screening is recommended for adults aged 22-80 years who are at high risk for developing lung cancer because of a history of smoking. A yearly low-dose CT scan of the lungs is recommended for people who have at least a 30-pack-year history of smoking and are a current smoker or have quit within the past 15 years. A pack year of smoking is smoking an average of 1 pack of cigarettes a day for 1 year (for example: 1 pack a day for 30 years or 2 packs a day for 15 years). Yearly screening should continue until the smoker has stopped smoking for at least 15 years. Yearly screening should be stopped for people who develop a health problem that would prevent them from having lung cancer treatment.  If you are pregnant, do not drink alcohol. If you are breastfeeding,  be very cautious about drinking alcohol. If you are not pregnant and choose to drink alcohol, do not have more than 1 drink per day. One drink is considered to be 12 ounces (355 mL) of beer, 5 ounces (148 mL) of wine, or 1.5 ounces (44 mL) of liquor.  Avoid use of street drugs. Do not share needles with anyone. Ask for help if you need support or instructions about stopping the use of drugs.  High blood pressure causes heart disease and increases the risk of  stroke. Your blood pressure should be checked at least every 1 to 2 years. Ongoing high blood pressure should be treated with medicines if weight loss and exercise do not work.  If you are 3-86 years old, ask your health care provider if you should take aspirin to prevent strokes.  Diabetes screening involves taking a blood sample to check your fasting blood sugar level. This should be done once every 3 years, after age 67, if you are within normal weight and without risk factors for diabetes. Testing should be considered at a younger age or be carried out more frequently if you are overweight and have at least 1 risk factor for diabetes.  Breast cancer screening is essential preventive care for women. You should practice "breast self-awareness." This means understanding the normal appearance and feel of your breasts and may include breast self-examination. Any changes detected, no matter how small, should be reported to a health care provider. Women in their 8s and 30s should have a clinical breast exam (CBE) by a health care provider as part of a regular health exam every 1 to 3 years. After age 70, women should have a CBE every year. Starting at age 25, women should consider having a mammogram (breast X-ray test) every year. Women who have a family history of breast cancer should talk to their health care provider about genetic screening. Women at a high risk of breast cancer should talk to their health care providers about having an MRI and a mammogram every year.  Breast cancer gene (BRCA)-related cancer risk assessment is recommended for women who have family members with BRCA-related cancers. BRCA-related cancers include breast, ovarian, tubal, and peritoneal cancers. Having family members with these cancers may be associated with an increased risk for harmful changes (mutations) in the breast cancer genes BRCA1 and BRCA2. Results of the assessment will determine the need for genetic counseling and  BRCA1 and BRCA2 testing.  Routine pelvic exams to screen for cancer are no longer recommended for nonpregnant women who are considered low risk for cancer of the pelvic organs (ovaries, uterus, and vagina) and who do not have symptoms. Ask your health care provider if a screening pelvic exam is right for you.  If you have had past treatment for cervical cancer or a condition that could lead to cancer, you need Pap tests and screening for cancer for at least 20 years after your treatment. If Pap tests have been discontinued, your risk factors (such as having a new sexual partner) need to be reassessed to determine if screening should be resumed. Some women have medical problems that increase the chance of getting cervical cancer. In these cases, your health care provider may recommend more frequent screening and Pap tests.  The HPV test is an additional test that may be used for cervical cancer screening. The HPV test looks for the virus that can cause the cell changes on the cervix. The cells collected during the Pap test can be  tested for HPV. The HPV test could be used to screen women aged 30 years and older, and should be used in women of any age who have unclear Pap test results. After the age of 30, women should have HPV testing at the same frequency as a Pap test.  Colorectal cancer can be detected and often prevented. Most routine colorectal cancer screening begins at the age of 50 years and continues through age 75 years. However, your health care provider may recommend screening at an earlier age if you have risk factors for colon cancer. On a yearly basis, your health care provider may provide home test kits to check for hidden blood in the stool. Use of a small camera at the end of a tube, to directly examine the colon (sigmoidoscopy or colonoscopy), can detect the earliest forms of colorectal cancer. Talk to your health care provider about this at age 50, when routine screening begins. Direct  exam of the colon should be repeated every 5-10 years through age 75 years, unless early forms of pre-cancerous polyps or small growths are found.  People who are at an increased risk for hepatitis B should be screened for this virus. You are considered at high risk for hepatitis B if:  You were born in a country where hepatitis B occurs often. Talk with your health care provider about which countries are considered high risk.  Your parents were born in a high-risk country and you have not received a shot to protect against hepatitis B (hepatitis B vaccine).  You have HIV or AIDS.  You use needles to inject street drugs.  You live with, or have sex with, someone who has hepatitis B.  You get hemodialysis treatment.  You take certain medicines for conditions like cancer, organ transplantation, and autoimmune conditions.  Hepatitis C blood testing is recommended for all people born from 1945 through 1965 and any individual with known risks for hepatitis C.  Practice safe sex. Use condoms and avoid high-risk sexual practices to reduce the spread of sexually transmitted infections (STIs). STIs include gonorrhea, chlamydia, syphilis, trichomonas, herpes, HPV, and human immunodeficiency virus (HIV). Herpes, HIV, and HPV are viral illnesses that have no cure. They can result in disability, cancer, and death.  You should be screened for sexually transmitted illnesses (STIs) including gonorrhea and chlamydia if:  You are sexually active and are younger than 24 years.  You are older than 24 years and your health care provider tells you that you are at risk for this type of infection.  Your sexual activity has changed since you were last screened and you are at an increased risk for chlamydia or gonorrhea. Ask your health care provider if you are at risk.  If you are at risk of being infected with HIV, it is recommended that you take a prescription medicine daily to prevent HIV infection. This is  called preexposure prophylaxis (PrEP). You are considered at risk if:  You are a heterosexual woman, are sexually active, and are at increased risk for HIV infection.  You take drugs by injection.  You are sexually active with a partner who has HIV.  Talk with your health care provider about whether you are at high risk of being infected with HIV. If you choose to begin PrEP, you should first be tested for HIV. You should then be tested every 3 months for as long as you are taking PrEP.  Osteoporosis is a disease in which the bones lose minerals and strength   with aging. This can result in serious bone fractures or breaks. The risk of osteoporosis can be identified using a bone density scan. Women ages 65 years and over and women at risk for fractures or osteoporosis should discuss screening with their health care providers. Ask your health care provider whether you should take a calcium supplement or vitamin D to reduce the rate of osteoporosis.  Menopause can be associated with physical symptoms and risks. Hormone replacement therapy is available to decrease symptoms and risks. You should talk to your health care provider about whether hormone replacement therapy is right for you.  Use sunscreen. Apply sunscreen liberally and repeatedly throughout the day. You should seek shade when your shadow is shorter than you. Protect yourself by wearing long sleeves, pants, a wide-brimmed hat, and sunglasses year round, whenever you are outdoors.  Once a month, do a whole body skin exam, using a mirror to look at the skin on your back. Tell your health care provider of new moles, moles that have irregular borders, moles that are larger than a pencil eraser, or moles that have changed in shape or color.  Stay current with required vaccines (immunizations).  Influenza vaccine. All adults should be immunized every year.  Tetanus, diphtheria, and acellular pertussis (Td, Tdap) vaccine. Pregnant women should  receive 1 dose of Tdap vaccine during each pregnancy. The dose should be obtained regardless of the length of time since the last dose. Immunization is preferred during the 27th-36th week of gestation. An adult who has not previously received Tdap or who does not know her vaccine status should receive 1 dose of Tdap. This initial dose should be followed by tetanus and diphtheria toxoids (Td) booster doses every 10 years. Adults with an unknown or incomplete history of completing a 3-dose immunization series with Td-containing vaccines should begin or complete a primary immunization series including a Tdap dose. Adults should receive a Td booster every 10 years.  Varicella vaccine. An adult without evidence of immunity to varicella should receive 2 doses or a second dose if she has previously received 1 dose. Pregnant females who do not have evidence of immunity should receive the first dose after pregnancy. This first dose should be obtained before leaving the health care facility. The second dose should be obtained 4-8 weeks after the first dose.  Human papillomavirus (HPV) vaccine. Females aged 13-26 years who have not received the vaccine previously should obtain the 3-dose series. The vaccine is not recommended for use in pregnant females. However, pregnancy testing is not needed before receiving a dose. If a female is found to be pregnant after receiving a dose, no treatment is needed. In that case, the remaining doses should be delayed until after the pregnancy. Immunization is recommended for any person with an immunocompromised condition through the age of 26 years if she did not get any or all doses earlier. During the 3-dose series, the second dose should be obtained 4-8 weeks after the first dose. The third dose should be obtained 24 weeks after the first dose and 16 weeks after the second dose.  Zoster vaccine. One dose is recommended for adults aged 60 years or older unless certain conditions are  present.  Measles, mumps, and rubella (MMR) vaccine. Adults born before 1957 generally are considered immune to measles and mumps. Adults born in 1957 or later should have 1 or more doses of MMR vaccine unless there is a contraindication to the vaccine or there is laboratory evidence of immunity to   each of the three diseases. A routine second dose of MMR vaccine should be obtained at least 28 days after the first dose for students attending postsecondary schools, health care workers, or international travelers. People who received inactivated measles vaccine or an unknown type of measles vaccine during 1963-1967 should receive 2 doses of MMR vaccine. People who received inactivated mumps vaccine or an unknown type of mumps vaccine before 1979 and are at high risk for mumps infection should consider immunization with 2 doses of MMR vaccine. For females of childbearing age, rubella immunity should be determined. If there is no evidence of immunity, females who are not pregnant should be vaccinated. If there is no evidence of immunity, females who are pregnant should delay immunization until after pregnancy. Unvaccinated health care workers born before 1957 who lack laboratory evidence of measles, mumps, or rubella immunity or laboratory confirmation of disease should consider measles and mumps immunization with 2 doses of MMR vaccine or rubella immunization with 1 dose of MMR vaccine.  Pneumococcal 13-valent conjugate (PCV13) vaccine. When indicated, a person who is uncertain of her immunization history and has no record of immunization should receive the PCV13 vaccine. An adult aged 19 years or older who has certain medical conditions and has not been previously immunized should receive 1 dose of PCV13 vaccine. This PCV13 should be followed with a dose of pneumococcal polysaccharide (PPSV23) vaccine. The PPSV23 vaccine dose should be obtained at least 8 weeks after the dose of PCV13 vaccine. An adult aged 19  years or older who has certain medical conditions and previously received 1 or more doses of PPSV23 vaccine should receive 1 dose of PCV13. The PCV13 vaccine dose should be obtained 1 or more years after the last PPSV23 vaccine dose.  Pneumococcal polysaccharide (PPSV23) vaccine. When PCV13 is also indicated, PCV13 should be obtained first. All adults aged 65 years and older should be immunized. An adult younger than age 65 years who has certain medical conditions should be immunized. Any person who resides in a nursing home or long-term care facility should be immunized. An adult smoker should be immunized. People with an immunocompromised condition and certain other conditions should receive both PCV13 and PPSV23 vaccines. People with human immunodeficiency virus (HIV) infection should be immunized as soon as possible after diagnosis. Immunization during chemotherapy or radiation therapy should be avoided. Routine use of PPSV23 vaccine is not recommended for American Indians, [place] Natives, or people younger than 65 years unless there are medical conditions that require PPSV23 vaccine. When indicated, people who have unknown immunization and have no record of immunization should receive PPSV23 vaccine. One-time revaccination 5 years after the first dose of PPSV23 is recommended for people aged 19-64 years who have chronic kidney failure, nephrotic syndrome, asplenia, or immunocompromised conditions. People who received 1-2 doses of PPSV23 before age 65 years should receive another dose of PPSV23 vaccine at age 65 years or later if at least 5 years have passed since the previous dose. Doses of PPSV23 are not needed for people immunized with PPSV23 at or after age 65 years.  Meningococcal vaccine. Adults with asplenia or persistent complement component deficiencies should receive 2 doses of quadrivalent meningococcal conjugate (MenACWY-D) vaccine. The doses should be obtained at least 2 months apart.  Microbiologists working with certain meningococcal bacteria, military recruits, people at risk during an outbreak, and people who travel to or live in countries with a high rate of meningitis should be immunized. A first-year college student up through age   21 years who is living in a residence hall should receive a dose if she did not receive a dose on or after her 16th birthday. Adults who have certain high-risk conditions should receive one or more doses of vaccine.  Hepatitis A vaccine. Adults who wish to be protected from this disease, have certain high-risk conditions, work with hepatitis A-infected animals, work in hepatitis A research labs, or travel to or work in countries with a high rate of hepatitis A should be immunized. Adults who were previously unvaccinated and who anticipate close contact with an international adoptee during the first 60 days after arrival in the [place] from a country with a high rate of hepatitis A should be immunized.  Hepatitis B vaccine. Adults who wish to be protected from this disease, have certain high-risk conditions, may be exposed to blood or other infectious body fluids, are household contacts or sex partners of hepatitis B positive people, are clients or workers in certain care facilities, or travel to or work in countries with a high rate of hepatitis B should be immunized.  Haemophilus influenzae type b (Hib) vaccine. A previously unvaccinated person with asplenia or sickle cell disease or having a scheduled splenectomy should receive 1 dose of Hib vaccine. Regardless of previous immunization, a recipient of a hematopoietic stem cell transplant should receive a 3-dose series 6-12 months after her successful transplant. Hib vaccine is not recommended for adults with HIV infection. Preventive Services / Frequency Ages 64 to 68 years  Blood pressure check.** / Every 1 to 2 years.  Lipid and cholesterol check.** / Every 5 years beginning at age  22.  Clinical breast exam.** / Every 3 years for women in their 88s and 53s.  BRCA-related cancer risk assessment.** / For women who have family members with a BRCA-related cancer (breast, ovarian, tubal, or peritoneal cancers).  Pap test.** / Every 2 years from ages 90 through 51. Every 3 years starting at age 21 through age 56 or 3 with a history of 3 consecutive normal Pap tests.  HPV screening.** / Every 3 years from ages 24 through ages 1 to 46 with a history of 3 consecutive normal Pap tests.  Hepatitis C blood test.** / For any individual with known risks for hepatitis C.  Skin self-exam. / Monthly.  Influenza vaccine. / Every year.  Tetanus, diphtheria, and acellular pertussis (Tdap, Td) vaccine.** / Consult your health care provider. Pregnant women should receive 1 dose of Tdap vaccine during each pregnancy. 1 dose of Td every 10 years.  Varicella vaccine.** / Consult your health care provider. Pregnant females who do not have evidence of immunity should receive the first dose after pregnancy.  HPV vaccine. / 3 doses over 6 months, if 72 and younger. The vaccine is not recommended for use in pregnant females. However, pregnancy testing is not needed before receiving a dose.  Measles, mumps, rubella (MMR) vaccine.** / You need at least 1 dose of MMR if you were born in 1957 or later. You may also need a 2nd dose. For females of childbearing age, rubella immunity should be determined. If there is no evidence of immunity, females who are not pregnant should be vaccinated. If there is no evidence of immunity, females who are pregnant should delay immunization until after pregnancy.  Pneumococcal 13-valent conjugate (PCV13) vaccine.** / Consult your health care provider.  Pneumococcal polysaccharide (PPSV23) vaccine.** / 1 to 2 doses if you smoke cigarettes or if you have certain conditions.  Meningococcal vaccine.** /  1 dose if you are age 19 to 21 years and a first-year college  student living in a residence hall, or have one of several medical conditions, you need to get vaccinated against meningococcal disease. You may also need additional booster doses.  Hepatitis A vaccine.** / Consult your health care provider.  Hepatitis B vaccine.** / Consult your health care provider.  Haemophilus influenzae type b (Hib) vaccine.** / Consult your health care provider. Ages 40 to 64 years  Blood pressure check.** / Every 1 to 2 years.  Lipid and cholesterol check.** / Every 5 years beginning at age 20 years.  Lung cancer screening. / Every year if you are aged 55-80 years and have a 30-pack-year history of smoking and currently smoke or have quit within the past 15 years. Yearly screening is stopped once you have quit smoking for at least 15 years or develop a health problem that would prevent you from having lung cancer treatment.  Clinical breast exam.** / Every year after age 40 years.  BRCA-related cancer risk assessment.** / For women who have family members with a BRCA-related cancer (breast, ovarian, tubal, or peritoneal cancers).  Mammogram.** / Every year beginning at age 40 years and continuing for as long as you are in good health. Consult with your health care provider.  Pap test.** / Every 3 years starting at age 30 years through age 65 or 70 years with a history of 3 consecutive normal Pap tests.  HPV screening.** / Every 3 years from ages 30 years through ages 65 to 70 years with a history of 3 consecutive normal Pap tests.  Fecal occult blood test (FOBT) of stool. / Every year beginning at age 50 years and continuing until age 75 years. You may not need to do this test if you get a colonoscopy every 10 years.  Flexible sigmoidoscopy or colonoscopy.** / Every 5 years for a flexible sigmoidoscopy or every 10 years for a colonoscopy beginning at age 50 years and continuing until age 75 years.  Hepatitis C blood test.** / For all people born from 1945 through  1965 and any individual with known risks for hepatitis C.  Skin self-exam. / Monthly.  Influenza vaccine. / Every year.  Tetanus, diphtheria, and acellular pertussis (Tdap/Td) vaccine.** / Consult your health care provider. Pregnant women should receive 1 dose of Tdap vaccine during each pregnancy. 1 dose of Td every 10 years.  Varicella vaccine.** / Consult your health care provider. Pregnant females who do not have evidence of immunity should receive the first dose after pregnancy.  Zoster vaccine.** / 1 dose for adults aged 60 years or older.  Measles, mumps, rubella (MMR) vaccine.** / You need at least 1 dose of MMR if you were born in 1957 or later. You may also need a 2nd dose. For females of childbearing age, rubella immunity should be determined. If there is no evidence of immunity, females who are not pregnant should be vaccinated. If there is no evidence of immunity, females who are pregnant should delay immunization until after pregnancy.  Pneumococcal 13-valent conjugate (PCV13) vaccine.** / Consult your health care provider.  Pneumococcal polysaccharide (PPSV23) vaccine.** / 1 to 2 doses if you smoke cigarettes or if you have certain conditions.  Meningococcal vaccine.** / Consult your health care provider.  Hepatitis A vaccine.** / Consult your health care provider.  Hepatitis B vaccine.** / Consult your health care provider.  Haemophilus influenzae type b (Hib) vaccine.** / Consult your health care provider. Ages 65   years and over  Blood pressure check.** / Every 1 to 2 years.  Lipid and cholesterol check.** / Every 5 years beginning at age 22 years.  Lung cancer screening. / Every year if you are aged 73-80 years and have a 30-pack-year history of smoking and currently smoke or have quit within the past 15 years. Yearly screening is stopped once you have quit smoking for at least 15 years or develop a health problem that would prevent you from having lung cancer  treatment.  Clinical breast exam.** / Every year after age 4 years.  BRCA-related cancer risk assessment.** / For women who have family members with a BRCA-related cancer (breast, ovarian, tubal, or peritoneal cancers).  Mammogram.** / Every year beginning at age 40 years and continuing for as long as you are in good health. Consult with your health care provider.  Pap test.** / Every 3 years starting at age 9 years through age 34 or 91 years with 3 consecutive normal Pap tests. Testing can be stopped between 65 and 70 years with 3 consecutive normal Pap tests and no abnormal Pap or HPV tests in the past 10 years.  HPV screening.** / Every 3 years from ages 57 years through ages 64 or 45 years with a history of 3 consecutive normal Pap tests. Testing can be stopped between 65 and 70 years with 3 consecutive normal Pap tests and no abnormal Pap or HPV tests in the past 10 years.  Fecal occult blood test (FOBT) of stool. / Every year beginning at age 15 years and continuing until age 17 years. You may not need to do this test if you get a colonoscopy every 10 years.  Flexible sigmoidoscopy or colonoscopy.** / Every 5 years for a flexible sigmoidoscopy or every 10 years for a colonoscopy beginning at age 86 years and continuing until age 71 years.  Hepatitis C blood test.** / For all people born from [birth year] through 1964 and any individual with known risks for hepatitis C.  Osteoporosis screening.** / A one-time screening for women ages 83 years and over and women at risk for fractures or osteoporosis.  Skin self-exam. / Monthly.  Influenza vaccine. / Every year.  Tetanus, diphtheria, and acellular pertussis (Tdap/Td) vaccine.** / 1 dose of Td every 10 years.  Varicella vaccine.** / Consult your health care provider.  Zoster vaccine.** / 1 dose for adults aged 61 years or older.  Pneumococcal 13-valent conjugate (PCV13) vaccine.** / Consult your health care provider.  Pneumococcal  polysaccharide (PPSV23) vaccine.** / 1 dose for all adults aged 28 years and older.  Meningococcal vaccine.** / Consult your health care provider.  Hepatitis A vaccine.** / Consult your health care provider.  Hepatitis B vaccine.** / Consult your health care provider.  Haemophilus influenzae type b (Hib) vaccine.** / Consult your health care provider. ** Family history and personal history of risk and conditions may change your health care provider's recommendations. Document Released: 02/07/2002 Document Revised: 04/28/2014 Document Reviewed: 05/09/2011 [facility] Patient Information 2015 [place], [place]. This information is not intended to replace advice given to you by your health care provider. Make sure you discuss any questions you have with your health care provider.

## 2014-08-05 ENCOUNTER — Telehealth: Payer: Self-pay | Admitting: Family Medicine

## 2014-08-05 LAB — CBC WITH DIFFERENTIAL/PLATELET
Basophils Absolute: 0 10*3/uL (ref 0.0–0.1)
Basophils Relative: 0.7 % (ref 0.0–3.0)
EOS PCT: 3.4 % (ref 0.0–5.0)
Eosinophils Absolute: 0.3 10*3/uL (ref 0.0–0.7)
HCT: 39.5 % (ref 36.0–46.0)
HEMOGLOBIN: 13.1 g/dL (ref 12.0–15.0)
LYMPHS ABS: 2.3 10*3/uL (ref 0.7–4.0)
Lymphocytes Relative: 31.2 % (ref 12.0–46.0)
MCHC: 33.2 g/dL (ref 30.0–36.0)
MCV: 86 fl (ref 78.0–100.0)
Monocytes Absolute: 0.4 10*3/uL (ref 0.1–1.0)
Monocytes Relative: 5.1 % (ref 3.0–12.0)
Neutro Abs: 4.4 10*3/uL (ref 1.4–7.7)
Neutrophils Relative %: 59.6 % (ref 43.0–77.0)
PLATELETS: 265 10*3/uL (ref 150.0–400.0)
RBC: 4.6 Mil/uL (ref 3.87–5.11)
RDW: 13.4 % (ref 11.5–15.5)
WBC: 7.4 10*3/uL (ref 4.0–10.5)

## 2014-08-05 LAB — HEPATIC FUNCTION PANEL
ALT: 24 U/L (ref 0–35)
AST: 18 U/L (ref 0–37)
Albumin: 4.3 g/dL (ref 3.5–5.2)
Alkaline Phosphatase: 35 U/L — ABNORMAL LOW (ref 39–117)
BILIRUBIN TOTAL: 0.7 mg/dL (ref 0.2–1.2)
Bilirubin, Direct: 0.1 mg/dL (ref 0.0–0.3)
Total Protein: 7.6 g/dL (ref 6.0–8.3)

## 2014-08-05 LAB — LIPID PANEL
CHOL/HDL RATIO: 5
CHOLESTEROL: 284 mg/dL — AB (ref 0–200)
HDL: 56.6 mg/dL (ref >39.00)
LDL Cholesterol: 201 mg/dL — ABNORMAL HIGH (ref 0–99)
NonHDL: 227.4
Triglycerides: 131 mg/dL (ref 0.0–149.0)
VLDL: 26.2 mg/dL (ref 0.0–40.0)

## 2014-08-05 LAB — TSH: TSH: 0.92 u[IU]/mL (ref 0.35–4.50)

## 2014-08-05 LAB — BASIC METABOLIC PANEL
BUN: 14 mg/dL (ref 6–23)
CO2: 25 mEq/L (ref 19–32)
Calcium: 9.5 mg/dL (ref 8.4–10.5)
Chloride: 104 mEq/L (ref 96–112)
Creatinine, Ser: 0.7 mg/dL (ref 0.4–1.2)
GFR: 93.27 mL/min (ref >60.00)
Glucose, Bld: 90 mg/dL (ref 70–99)
POTASSIUM: 4 meq/L (ref 3.5–5.1)
SODIUM: 139 meq/L (ref 135–145)

## 2014-08-05 NOTE — Telephone Encounter (Signed)
Relevant patient education assigned to patient using Emmi. ° °

## 2014-08-06 ENCOUNTER — Other Ambulatory Visit (HOSPITAL_BASED_OUTPATIENT_CLINIC_OR_DEPARTMENT_OTHER): Payer: 59

## 2014-08-06 LAB — URINE CULTURE: Colony Count: >=100000

## 2014-08-06 LAB — CYTOLOGY - PAP

## 2014-08-15 ENCOUNTER — Ambulatory Visit (HOSPITAL_BASED_OUTPATIENT_CLINIC_OR_DEPARTMENT_OTHER)
Admission: RE | Admit: 2014-08-15 | Discharge: 2014-08-15 | Disposition: A | Discharge status: Home / Self Care | Payer: 59 | Source: Ambulatory Visit | Attending: Family Medicine | Admitting: Family Medicine

## 2014-08-15 DIAGNOSIS — E041 Nontoxic single thyroid nodule: Secondary | ICD-10-CM | POA: Insufficient documentation

## 2014-08-15 DIAGNOSIS — Z8639 Personal history of other endocrine, nutritional and metabolic disease: Secondary | ICD-10-CM

## 2014-08-15 DIAGNOSIS — Z862 Personal history of diseases of the blood and blood-forming organs and certain disorders involving the immune mechanism: Secondary | ICD-10-CM | POA: Diagnosis present

## 2014-08-29 ENCOUNTER — Other Ambulatory Visit: Payer: Self-pay

## 2014-08-29 DIAGNOSIS — Z1231 Encounter for screening mammogram for malignant neoplasm of breast: Secondary | ICD-10-CM

## 2014-09-11 ENCOUNTER — Ambulatory Visit
Admission: RE | Admit: 2014-09-11 | Discharge: 2014-09-11 | Disposition: A | Discharge status: Home / Self Care | Payer: 59 | Source: Ambulatory Visit

## 2014-09-11 DIAGNOSIS — Z1231 Encounter for screening mammogram for malignant neoplasm of breast: Secondary | ICD-10-CM

## 2014-12-03 ENCOUNTER — Telehealth: Payer: Self-pay

## 2014-12-03 NOTE — Telephone Encounter (Signed)
Called patient to scheduled her Prolia and she made me aware that she found a new PCP who recommends that she does not take the prolia injection for a year. He will restart her on it next year. But she wanted [place] to know she is not a patient here anymore.     KP

## 2014-12-15 ENCOUNTER — Encounter (HOSPITAL_COMMUNITY)
Admission: EM | Disposition: A | Discharge status: Nursing Home (Includes ICF) | Payer: Self-pay | Source: Home / Self Care | Attending: Internal Medicine

## 2014-12-15 ENCOUNTER — Inpatient Hospital Stay (HOSPITAL_COMMUNITY): Payer: 59 | Admitting: Anesthesiology

## 2014-12-15 ENCOUNTER — Inpatient Hospital Stay (HOSPITAL_COMMUNITY): Payer: 59

## 2014-12-15 ENCOUNTER — Inpatient Hospital Stay (HOSPITAL_COMMUNITY)
Admission: EM | Admit: 2014-12-15 | Discharge: 2014-12-18 | DRG: 481 | Disposition: A | Discharge status: Nursing Home (Includes ICF) | Payer: 59 | Attending: Internal Medicine | Admitting: Internal Medicine

## 2014-12-15 ENCOUNTER — Emergency Department (HOSPITAL_COMMUNITY): Payer: 59

## 2014-12-15 ENCOUNTER — Encounter (HOSPITAL_COMMUNITY): Payer: Self-pay | Admitting: *Deleted

## 2014-12-15 DIAGNOSIS — S72009A Fracture of unspecified part of neck of unspecified femur, initial encounter for closed fracture: Secondary | ICD-10-CM | POA: Diagnosis present

## 2014-12-15 DIAGNOSIS — S72002A Fracture of unspecified part of neck of left femur, initial encounter for closed fracture: Secondary | ICD-10-CM

## 2014-12-15 DIAGNOSIS — M899 Disorder of bone, unspecified: Secondary | ICD-10-CM | POA: Diagnosis present

## 2014-12-15 DIAGNOSIS — W19XXXA Unspecified fall, initial encounter: Secondary | ICD-10-CM | POA: Diagnosis present

## 2014-12-15 DIAGNOSIS — M170 Bilateral primary osteoarthritis of knee: Secondary | ICD-10-CM | POA: Diagnosis present

## 2014-12-15 DIAGNOSIS — S72102A Unspecified trochanteric fracture of left femur, initial encounter for closed fracture: Principal | ICD-10-CM | POA: Diagnosis present

## 2014-12-15 DIAGNOSIS — N390 Urinary tract infection, site not specified: Secondary | ICD-10-CM | POA: Diagnosis present

## 2014-12-15 DIAGNOSIS — D72829 Elevated white blood cell count, unspecified: Secondary | ICD-10-CM | POA: Diagnosis present

## 2014-12-15 DIAGNOSIS — M879 Osteonecrosis, unspecified: Secondary | ICD-10-CM

## 2014-12-15 DIAGNOSIS — I10 Essential (primary) hypertension: Secondary | ICD-10-CM | POA: Diagnosis present

## 2014-12-15 DIAGNOSIS — E039 Hypothyroidism, unspecified: Secondary | ICD-10-CM | POA: Diagnosis present

## 2014-12-15 DIAGNOSIS — S7223XA Displaced subtrochanteric fracture of unspecified femur, initial encounter for closed fracture: Secondary | ICD-10-CM | POA: Diagnosis present

## 2014-12-15 DIAGNOSIS — E785 Hyperlipidemia, unspecified: Secondary | ICD-10-CM | POA: Diagnosis present

## 2014-12-15 DIAGNOSIS — S7222XA Displaced subtrochanteric fracture of left femur, initial encounter for closed fracture: Secondary | ICD-10-CM | POA: Diagnosis present

## 2014-12-15 HISTORY — PX: INTRAMEDULLARY (IM) NAIL INTERTROCHANTERIC: SHX5875

## 2014-12-15 LAB — COMPREHENSIVE METABOLIC PANEL
ALBUMIN: 4.2 g/dL (ref 3.5–5.2)
ALK PHOS: 54 U/L (ref 39–117)
ALT: 35 U/L (ref 0–35)
AST: 31 U/L (ref 0–37)
Anion gap: 18 — ABNORMAL HIGH (ref 5–15)
BUN: 16 mg/dL (ref 6–23)
CO2: 21 mEq/L (ref 19–32)
Calcium: 10.1 mg/dL (ref 8.4–10.5)
Chloride: 97 mEq/L (ref 96–112)
Creatinine, Ser: 0.88 mg/dL (ref 0.50–1.10)
GFR calc Af Amer: 80 mL/min — ABNORMAL LOW (ref >90)
GFR calc non Af Amer: 69 mL/min — ABNORMAL LOW (ref >90)
GLUCOSE: 168 mg/dL — AB (ref 70–99)
POTASSIUM: 4 meq/L (ref 3.7–5.3)
SODIUM: 136 meq/L — AB (ref 137–147)
Total Bilirubin: 0.4 mg/dL (ref 0.3–1.2)
Total Protein: 7.6 g/dL (ref 6.0–8.3)

## 2014-12-15 LAB — URINALYSIS, ROUTINE W REFLEX MICROSCOPIC
Bilirubin Urine: NEGATIVE
Glucose, UA: NEGATIVE mg/dL
HGB URINE DIPSTICK: NEGATIVE
Ketones, ur: NEGATIVE mg/dL
Nitrite: POSITIVE — AB
PH: 5 (ref 5.0–8.0)
PROTEIN: NEGATIVE mg/dL
SPECIFIC GRAVITY, URINE: 1.014 (ref 1.005–1.030)
Urobilinogen, UA: 0.2 mg/dL (ref 0.0–1.0)

## 2014-12-15 LAB — PROTIME-INR
INR: 0.94 (ref 0.00–1.49)
Prothrombin Time: 12.7 seconds (ref 11.6–15.2)

## 2014-12-15 LAB — TYPE AND SCREEN
ABO/RH(D): A POS
Antibody Screen: NEGATIVE

## 2014-12-15 LAB — CBC WITH DIFFERENTIAL/PLATELET
Basophils Absolute: 0.1 10*3/uL (ref 0.0–0.1)
Basophils Relative: 1 % (ref 0–1)
EOS ABS: 0.4 10*3/uL (ref 0.0–0.7)
EOS PCT: 3 % (ref 0–5)
HEMATOCRIT: 40.5 % (ref 36.0–46.0)
HEMOGLOBIN: 13.4 g/dL (ref 12.0–15.0)
Lymphocytes Relative: 22 % (ref 12–46)
Lymphs Abs: 3 10*3/uL (ref 0.7–4.0)
MCH: 29 pg (ref 26.0–34.0)
MCHC: 33.1 g/dL (ref 30.0–36.0)
MCV: 87.7 fL (ref 78.0–100.0)
MONO ABS: 0.7 10*3/uL (ref 0.1–1.0)
MONOS PCT: 5 % (ref 3–12)
NEUTROS ABS: 9.7 10*3/uL — AB (ref 1.7–7.7)
Neutrophils Relative %: 69 % (ref 43–77)
Platelets: 313 10*3/uL (ref 150–400)
RBC: 4.62 MIL/uL (ref 3.87–5.11)
RDW: 13.4 % (ref 11.5–15.5)
WBC: 13.9 10*3/uL — ABNORMAL HIGH (ref 4.0–10.5)

## 2014-12-15 LAB — ABO/RH: ABO/RH(D): A POS

## 2014-12-15 LAB — URINE MICROSCOPIC-ADD ON

## 2014-12-15 SURGERY — FIXATION, FRACTURE, INTERTROCHANTERIC, WITH INTRAMEDULLARY ROD
Anesthesia: General | Site: Hip | Laterality: Left

## 2014-12-15 MED ORDER — PROMETHAZINE HCL 25 MG/ML IJ SOLN: 6.2500 mg | INTRAMUSCULAR | Status: DC | PRN

## 2014-12-15 MED ORDER — HYDROMORPHONE HCL 1 MG/ML IJ SOLN
0.2500 mg | INTRAMUSCULAR | Status: DC | PRN
  Administered 2014-12-16 (×4): 0.5 mg via INTRAVENOUS

## 2014-12-15 MED ORDER — 0.9 % SODIUM CHLORIDE (POUR BTL) OPTIME
TOPICAL | Status: DC | PRN
Start: 2014-12-15 — End: 2014-12-16
  Administered 2014-12-15: 1000 mL

## 2014-12-15 MED ORDER — IRBESARTAN 300 MG PO TABS
300.0000 mg | ORAL_TABLET | Freq: Every day | ORAL | Status: DC
  Filled 2014-12-15: qty 1

## 2014-12-15 MED ORDER — FLUTICASONE PROPIONATE 50 MCG/ACT NA SUSP
2.0000 | Freq: Every day | NASAL | Status: DC
  Administered 2014-12-16 – 2014-12-18 (×3): 2 via NASAL
  Filled 2014-12-15: qty 16

## 2014-12-15 MED ORDER — LEVOTHYROXINE SODIUM 88 MCG PO TABS
88.0000 ug | ORAL_TABLET | Freq: Every day | ORAL | Status: DC
Start: 2014-12-16 — End: 2014-12-18
  Administered 2014-12-16 – 2014-12-18 (×3): 88 ug via ORAL
  Filled 2014-12-15 (×4): qty 1

## 2014-12-15 MED ORDER — PHENYLEPHRINE HCL 10 MG/ML IJ SOLN
INTRAMUSCULAR | Status: DC | PRN
  Administered 2014-12-15: 40 ug via INTRAVENOUS

## 2014-12-15 MED ORDER — ONDANSETRON HCL 4 MG/2ML IJ SOLN
4.0000 mg | Freq: Once | INTRAMUSCULAR | Status: AC
  Administered 2014-12-15: 4 mg via INTRAVENOUS
  Filled 2014-12-15: qty 2

## 2014-12-15 MED ORDER — MORPHINE SULFATE 4 MG/ML IJ SOLN
4.0000 mg | INTRAMUSCULAR | Status: DC | PRN
  Administered 2014-12-15: 4 mg via INTRAVENOUS
  Filled 2014-12-15: qty 1

## 2014-12-15 MED ORDER — ONDANSETRON HCL 4 MG/2ML IJ SOLN
INTRAMUSCULAR | Status: AC
  Filled 2014-12-15: qty 2

## 2014-12-15 MED ORDER — CEFAZOLIN SODIUM-DEXTROSE 2-3 GM-% IV SOLR
INTRAVENOUS | Status: AC
  Filled 2014-12-15: qty 50

## 2014-12-15 MED ORDER — PHENYLEPHRINE 40 MCG/ML (10ML) SYRINGE FOR IV PUSH (FOR BLOOD PRESSURE SUPPORT)
PREFILLED_SYRINGE | INTRAVENOUS | Status: AC
  Filled 2014-12-15: qty 10

## 2014-12-15 MED ORDER — PROPOFOL 10 MG/ML IV BOLUS
INTRAVENOUS | Status: AC
  Filled 2014-12-15: qty 20

## 2014-12-15 MED ORDER — METHOCARBAMOL 1000 MG/10ML IJ SOLN
500.0000 mg | Freq: Four times a day (QID) | INTRAMUSCULAR | Status: DC | PRN
  Filled 2014-12-15: qty 5

## 2014-12-15 MED ORDER — PROPOFOL 10 MG/ML IV BOLUS
INTRAVENOUS | Status: DC | PRN
  Administered 2014-12-15: 100 mg via INTRAVENOUS

## 2014-12-15 MED ORDER — SODIUM CHLORIDE 0.9 % IV SOLN
1000.0000 mL | INTRAVENOUS | Status: DC
  Administered 2014-12-15 – 2014-12-16 (×2): 1000 mL via INTRAVENOUS

## 2014-12-15 MED ORDER — MORPHINE SULFATE 4 MG/ML IJ SOLN
4.0000 mg | Freq: Once | INTRAMUSCULAR | Status: AC
  Administered 2014-12-15: 4 mg via INTRAVENOUS
  Filled 2014-12-15: qty 1

## 2014-12-15 MED ORDER — IRBESARTAN 300 MG PO TABS
300.0000 mg | ORAL_TABLET | Freq: Every day | ORAL | Status: DC
  Administered 2014-12-16 – 2014-12-18 (×2): 300 mg via ORAL
  Filled 2014-12-15 (×3): qty 1

## 2014-12-15 MED ORDER — EPHEDRINE SULFATE 50 MG/ML IJ SOLN
INTRAMUSCULAR | Status: DC | PRN
  Administered 2014-12-15: 10 mg via INTRAVENOUS

## 2014-12-15 MED ORDER — SUCCINYLCHOLINE CHLORIDE 20 MG/ML IJ SOLN
INTRAMUSCULAR | Status: DC | PRN
  Administered 2014-12-15: 100 mg via INTRAVENOUS

## 2014-12-15 MED ORDER — FENTANYL CITRATE 0.05 MG/ML IJ SOLN
INTRAMUSCULAR | Status: DC | PRN
  Administered 2014-12-15: 100 ug via INTRAVENOUS
  Administered 2014-12-15 – 2014-12-16 (×3): 50 ug via INTRAVENOUS

## 2014-12-15 MED ORDER — NEOSTIGMINE METHYLSULFATE 10 MG/10ML IV SOLN
INTRAVENOUS | Status: AC
  Filled 2014-12-15: qty 1

## 2014-12-15 MED ORDER — MIDAZOLAM HCL 2 MG/2ML IJ SOLN
INTRAMUSCULAR | Status: AC
  Filled 2014-12-15: qty 2

## 2014-12-15 MED ORDER — ROCURONIUM BROMIDE 100 MG/10ML IV SOLN
INTRAVENOUS | Status: DC | PRN
  Administered 2014-12-15: 20 mg via INTRAVENOUS

## 2014-12-15 MED ORDER — CEFAZOLIN SODIUM-DEXTROSE 2-3 GM-% IV SOLR
INTRAVENOUS | Status: DC | PRN
  Administered 2014-12-15: 2 g via INTRAVENOUS

## 2014-12-15 MED ORDER — FENTANYL CITRATE 0.05 MG/ML IJ SOLN
INTRAMUSCULAR | Status: AC
  Filled 2014-12-15: qty 5

## 2014-12-15 MED ORDER — MIDAZOLAM HCL 5 MG/5ML IJ SOLN
INTRAMUSCULAR | Status: DC | PRN
  Administered 2014-12-15 (×2): 1 mg via INTRAVENOUS

## 2014-12-15 MED ORDER — METHOCARBAMOL 500 MG PO TABS: 500.0000 mg | ORAL_TABLET | Freq: Four times a day (QID) | ORAL | Status: DC | PRN

## 2014-12-15 MED ORDER — GLYCOPYRROLATE 0.2 MG/ML IJ SOLN
INTRAMUSCULAR | Status: AC
  Filled 2014-12-15: qty 2

## 2014-12-15 MED ORDER — FLUTICASONE PROPIONATE 50 MCG/ACT NA SUSP
2.0000 | Freq: Every day | NASAL | Status: DC
  Filled 2014-12-15: qty 16

## 2014-12-15 MED ORDER — LACTATED RINGERS IV SOLN
INTRAVENOUS | Status: DC | PRN
  Administered 2014-12-15: 22:00:00 via INTRAVENOUS

## 2014-12-15 SURGICAL SUPPLY — 45 items
BAG ZIPLOCK 12X15 (MISCELLANEOUS) ×3 IMPLANT
BIT DRILL 4.3MMS DISTAL GRDTED (BIT) ×1 IMPLANT
BNDG COHESIVE 6X5 TAN STRL LF (GAUZE/BANDAGES/DRESSINGS) ×3 IMPLANT
DRAPE C-ARM 42X120 X-RAY (DRAPES) ×3 IMPLANT
DRAPE INCISE IOBAN 66X45 STRL (DRAPES) ×3 IMPLANT
DRAPE ORTHO SPLIT 77X108 STRL (DRAPES) ×2
DRAPE SHEET LG 3/4 BI-LAMINATE (DRAPES) ×3 IMPLANT
DRAPE STERI IOBAN 125X83 (DRAPES) ×3 IMPLANT
DRAPE SURG ORHT 6 SPLT 77X108 (DRAPES) ×1 IMPLANT
DRAPE U-SHAPE 47X51 STRL (DRAPES) ×3 IMPLANT
DRILL 4.3MMS DISTAL GRADUATED (BIT) ×3
DRSG MEPILEX BORDER 4X12 (GAUZE/BANDAGES/DRESSINGS) ×3 IMPLANT
DRSG MEPILEX BORDER 4X4 (GAUZE/BANDAGES/DRESSINGS) ×3 IMPLANT
DRSG PAD ABDOMINAL 8X10 ST (GAUZE/BANDAGES/DRESSINGS) ×3 IMPLANT
DURAPREP 26ML APPLICATOR (WOUND CARE) ×3 IMPLANT
ELECT REM PT RETURN 9FT ADLT (ELECTROSURGICAL) ×3
ELECTRODE REM PT RTRN 9FT ADLT (ELECTROSURGICAL) ×1 IMPLANT
FACESHIELD WRAPAROUND (MASK) ×6 IMPLANT
GAUZE SPONGE 4X4 12PLY STRL (GAUZE/BANDAGES/DRESSINGS) ×3 IMPLANT
GLOVE ORTHO TXT STRL SZ7.5 (GLOVE) ×3 IMPLANT
GLOVE SURG ORTHO 8.5 STRL (GLOVE) ×3 IMPLANT
GOWN STRL REUS W/TWL LRG LVL3 (GOWN DISPOSABLE) ×6 IMPLANT
GUIDEPIN 3.2X17.5 THRD DISP (PIN) ×6 IMPLANT
GUIDEWIRE BALL NOSE 100CM (WIRE) ×3 IMPLANT
HFN LH 130 DEG 11MM X 340MM (Nail) ×3 IMPLANT
HIP FRAC NAIL LAG SCR 10.5X100 (Orthopedic Implant) ×2 IMPLANT
KIT BASIN OR (CUSTOM PROCEDURE TRAY) ×3 IMPLANT
MANIFOLD NEPTUNE II (INSTRUMENTS) ×3 IMPLANT
PACK GENERAL/GYN (CUSTOM PROCEDURE TRAY) ×3 IMPLANT
PACK ORTHO EXTREMITY (CUSTOM PROCEDURE TRAY) ×3 IMPLANT
PAD CAST 4YDX4 CTTN HI CHSV (CAST SUPPLIES) ×1 IMPLANT
PADDING CAST COTTON 4X4 STRL (CAST SUPPLIES) ×2
POSITIONER SURGICAL ARM (MISCELLANEOUS) ×3 IMPLANT
SCREW ANTI ROTATION 85MM (Screw) ×3 IMPLANT
SCREW BONE CORTICAL 5.0X36 (Screw) ×3 IMPLANT
SCREW BONE CORTICAL 5.0X40 (Screw) ×3 IMPLANT
SCREW CANN THRD AFF 10.5X100 (Orthopedic Implant) ×1 IMPLANT
SCREW DRILL BIT ANIT ROTATION (BIT) ×3 IMPLANT
STAPLER VISISTAT (STAPLE) ×3 IMPLANT
STAPLER VISISTAT 35W (STAPLE) ×3 IMPLANT
STOCKINETTE 8 INCH (MISCELLANEOUS) ×3 IMPLANT
SUT VIC AB 0 CT1 36 (SUTURE) ×6 IMPLANT
SUT VIC AB 2-0 CT1 27 (SUTURE) ×4
SUT VIC AB 2-0 CT1 TAPERPNT 27 (SUTURE) ×2 IMPLANT
TOWEL OR 17X26 10 PK STRL BLUE (TOWEL DISPOSABLE) ×6 IMPLANT

## 2014-12-15 NOTE — ED Notes (Signed)
Pt was in parking lot, was trying to go up [place] area and fell backwards onto hips. Hit head, but no LOC. Left hip pain 4/10.

## 2014-12-15 NOTE — Progress Notes (Signed)
CSW met with patient at bedside. There was no family present. Patient confirms that she fell today.Patient states she feels much better than when she first came in. Patient informed CSW that prior to her fall she has been able to complete ADL's independently. Patient says she stays at home with her husband, who is also at he hospital tonight because of his own health concerns.   Patient informed CSW that she has had a lot of pain killers and that she has been falling in and out of sleep.  Willette Brace [phone] ED CSW 12/15/2014 9:42 PM

## 2014-12-15 NOTE — Consult Note (Signed)
Reason for Consult:broken left hip Referring Physician: Alcario Drought MD  [name] is an 63 y.o. female.  HPI: 63 yo female who fell in the parking lot coming to visit her husband here in the hospital.  Patient complained of immediate left hip pain and was unable to bear weight or stand after the fall. Patient denies other complaints or LOC.  Past Medical History  Diagnosis Date  . 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 Chickenpox   . Genital warts   . Environmental allergies   . Heart murmur   . HTN (hypertension)   . Hyperlipidemia   . Thyroid disease     Hypothyroid  . Thyroid nodule     Past Surgical History  Procedure Laterality Date  . Carpal tunnel release      Both wrist  . Cystectomy      From the cervix    Family History  Problem Relation Age of Onset  . Arthritis Mother   . Hyperlipidemia Mother   . Heart disease Father   . Stroke Father     x's 2  . High blood pressure Father   . High blood pressure Mother   . Diabetes Mother   . Myasthenia gravis Mother     Social History:  reports that she has never smoked. She has never used smokeless tobacco. She reports that she drinks alcohol. She reports that she does not use illicit drugs.  Allergies:  Allergies  Allergen Reactions  . Statins Diarrhea    Medications: I have reviewed the patient's current medications.  Results for orders placed or performed during the hospital encounter of 12/15/14 (from the past 48 hour(s))  Comprehensive metabolic panel     Status: Abnormal   Collection Time: 12/15/14  6:18 PM  Result Value Ref Range   Sodium 136 (L) 137 - 147 mEq/L   Potassium 4.0 3.7 - 5.3 mEq/L   Chloride 97 96 - 112 mEq/L   CO2 21 19 - 32 mEq/L   Glucose, Bld 168 (H) 70 - 99 mg/dL   BUN 16 6 - 23 mg/dL   Creatinine, Ser 0.88 0.50 - 1.10 mg/dL   Calcium 10.1 8.4 - 10.5 mg/dL   Total Protein 7.6 6.0 - 8.3 g/dL   Albumin 4.2 3.5 - 5.2 g/dL   AST 31 0 - 37 U/L   ALT 35 0 - 35 U/L    Alkaline Phosphatase 54 39 - 117 U/L   Total Bilirubin 0.4 0.3 - 1.2 mg/dL   GFR calc non Af Amer 69 (L) >90 mL/min   GFR calc Af Amer 80 (L) >90 mL/min    Comment: (NOTE) The eGFR has been calculated using the CKD EPI equation. This calculation has not been validated in all clinical situations. eGFR's persistently <90 mL/min signify possible Chronic Kidney Disease.    Anion gap 18 (H) 5 - 15  CBC with Differential     Status: Abnormal   Collection Time: 12/15/14  6:18 PM  Result Value Ref Range   WBC 13.9 (H) 4.0 - 10.5 K/uL   RBC 4.62 3.87 - 5.11 MIL/uL   Hemoglobin 13.4 12.0 - 15.0 g/dL   HCT 40.5 36.0 - 46.0 %   MCV 87.7 78.0 - 100.0 fL   MCH 29.0 26.0 - 34.0 pg   MCHC 33.1 30.0 - 36.0 g/dL   RDW 13.4 11.5 - 15.5 %   Platelets 313 150 - 400 K/uL   Neutrophils Relative %  69 43 - 77 %   Neutro Abs 9.7 (H) 1.7 - 7.7 K/uL   Lymphocytes Relative 22 12 - 46 %   Lymphs Abs 3.0 0.7 - 4.0 K/uL   Monocytes Relative 5 3 - 12 %   Monocytes Absolute 0.7 0.1 - 1.0 K/uL   Eosinophils Relative 3 0 - 5 %   Eosinophils Absolute 0.4 0.0 - 0.7 K/uL   Basophils Relative 1 0 - 1 %   Basophils Absolute 0.1 0.0 - 0.1 K/uL  Protime-INR     Status: None   Collection Time: 12/15/14  6:18 PM  Result Value Ref Range   Prothrombin Time 12.7 11.6 - 15.2 seconds   INR 0.94 0.00 - 1.49    Dg Chest 1 View  Stone   CLINICAL DATA:  Preoperative hip surgery.  Hypertension  EXAM: CHEST - 1 VIEW  COMPARISON:  None.  FINDINGS: Lungs are clear. Heart size and pulmonary vascularity are normal. No adenopathy.  There is a sclerotic lesion in the proximal right humerus, incompletely visualized.  IMPRESSION: No edema or consolidation. Sclerotic lesion proximal right humerus, incompletely visualized. This lesion most likely represents either a bone infarct or enchondroma. Right humerus radiographs to better characterize this lesion advised.   Electronically Signed   By: Lowella Grip M.D.   On: Stone  20:01   Dg Hip Complete Left  Stone   CLINICAL DATA:  Fall in parking lot. Left hip pain. Initial encounter.  EXAM: LEFT HIP - COMPLETE 2+ VIEW  COMPARISON:  None.  FINDINGS: The bones appear adequately mineralized. There is an acute comminuted and angulated intertrochanteric left femur fracture. The femoral head appears intact. There is no dislocation or evidence of pelvic fracture. There are mild underlying degenerative changes of both hips and sacroiliac joints.  IMPRESSION: Acute, comminuted and angulated intertrochanteric left femur fracture.   Electronically Signed   By: Camie Patience M.D.   On: Stone 19:19    ROS Blood pressure 158/91, pulse 118, temperature 98.1 F (36.7 C), temperature source Oral, resp. rate 22, SpO2 96 %. Physical Exam  Neck nontender with full AROM, bilateral UEs with no tenderness, no swelling , and normal AROM and strength. Sensation intact Abdomen soft, chest nontender with normal excursion, right LE with no pain with AROM, NVI, left LE shortened and externally rotated, NVI unable to range due to pain  Assessment/Plan: Left displaced hip fracture. Patient is well managed medically for thyroid disease and hypertension. Will check some formal right shoulder XRAYs as her CXR shows an abnormality in the proximal humerus consistent with bone infarct or cartilage tumor. Plan IM nail tonight. Npo now.  [name],[name], 8:08 PM

## 2014-12-15 NOTE — ED Provider Notes (Signed)
CSN: [id number]     Arrival date & time 12/15/14  1725 History   First MD Initiated Contact with Patient 12/15/14 1743     Chief Complaint  Patient presents with  . Fall  . Hip Pain  . Back Pain     (Consider location/radiation/quality/duration/timing/severity/associated sxs/prior Treatment) HPI The patient was leaving the hospital after visiting with her husband. She reports that she misstepped and slipped. She fell to the ground and had severe pain in her left hip. She was unable to get back up due to pain limitation. She reports that she did hit her head however she had no loss of consciousness. She reports she does not think it was serious. She denies any associated headache. She is not on any blood thinner medications. She has no associated neurologic symptoms. The patient has no chest pain or shortness of breath. She has no associated intra-abdominal pain. She reports no numbness or weakness to the lower extremities however she is severely limited by pain as to any movements that she can make with her left lower extremity. Past Medical History  Diagnosis Date  . 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 Chickenpox   . Genital warts   . Environmental allergies   . Heart murmur   . HTN (hypertension)   . Hyperlipidemia   . Thyroid disease     Hypothyroid  . Thyroid nodule    Past Surgical History  Procedure Laterality Date  . Carpal tunnel release      Both wrist  . Cystectomy      From the cervix   Family History  Problem Relation Age of Onset  . Arthritis Mother   . Hyperlipidemia Mother   . Heart disease Father   . Stroke Father     x's 2  . High blood pressure Father   . High blood pressure Mother   . Diabetes Mother   . Myasthenia gravis Mother    History  Substance Use Topics  . Smoking status: Never Smoker   . Smokeless tobacco: Never Used  . Alcohol Use: Yes     Comment: Occ   OB History    No data available     Review of Systems  10  Systems reviewed and are negative for acute change except as noted in the HPI.   Allergies  Statins  Home Medications   Prior to Admission medications   Medication Sig Start Date End Date Taking? Authorizing Provider  azelastine (ASTELIN) 0.1 % nasal spray Place 1 spray into both nostrils 2 (two) times daily. Use in each nostril as directed 08/04/14   Rosalita Chessman, DO  Clocortolone Pivalate (CLODERM) 0.1 % cream Apply 1 application topically 2 (two) times daily.    Historical Provider, MD  fluticasone (FLONASE) 50 MCG/ACT nasal spray Place 2 sprays into both nostrils daily. 08/04/14   Rosalita Chessman, DO  levothyroxine (SYNTHROID) 88 MCG tablet Take 1 tablet (88 mcg total) by mouth daily. 08/04/14   Rosalita Chessman, DO  NONFORMULARY OR COMPOUNDED ITEM Knee sleeve #1  As directed 11/01/13   Rosalita Chessman, DO  valsartan (DIOVAN) 320 MG tablet TAKE 1 TABLET DAILY 08/04/14   Alferd Apa Lowne, DO   BP 158/91 mmHg  Pulse 118  Temp(Src) 98.1 F (36.7 C) (Oral)  Resp 22  SpO2 96% Physical Exam  Constitutional: She is oriented to person, place, and time. She appears well-developed and well-nourished.  HENT:  Head:  Normocephalic and atraumatic.  Nose: Nose normal.  Mouth/Throat: Oropharynx is clear and moist.  Eyes: EOM are normal. Pupils are equal, round, and reactive to light.  Neck: Neck supple.  No cervical spine tenderness.  Cardiovascular: Normal rate, regular rhythm, normal heart sounds and intact distal pulses.   Pulmonary/Chest: Effort normal and breath sounds normal. She exhibits no tenderness.  Abdominal: Soft. Bowel sounds are normal. She exhibits no distension. There is no tenderness.  Musculoskeletal: She exhibits no edema.  The patient has an externally rotated and shortened left lower extremity. The extremity is neurovascularly intact with the patient having a warm dry foot and able to move the toes and foot.  Neurological: She is alert and oriented to person, place, and time.  She has normal strength. Coordination normal. GCS eye subscore is 4. GCS verbal subscore is 5. GCS motor subscore is 6.  Skin: Skin is warm, dry and intact.  Psychiatric: She has a normal mood and affect.    ED Course  Procedures (including critical care time) Labs Review Labs Reviewed  CBC WITH DIFFERENTIAL - Abnormal; Notable for the following:    WBC 13.9 (*)    Neutro Abs 9.7 (*)    All other components within normal limits  COMPREHENSIVE METABOLIC PANEL  PROTIME-INR  URINALYSIS, ROUTINE W REFLEX MICROSCOPIC  TYPE AND SCREEN    Imaging Review No results found.   EKG Interpretation None     Consult(19:12): Dr. Veverly Fells of [facility] orthopedics. Admit to medicine maintained nothing by mouth. MDM   Final diagnoses:  Hip fracture requiring operative repair, left, closed, initial encounter   The patient has had a mechanical fall. At this time she does not appear to have other associated injuries. Her mental status is excellent. She has limited associated medical problems of hypertension and hypothyroidism.    Charlesetta Shanks, MD 12/15/14 [phone]

## 2014-12-15 NOTE — ED Notes (Signed)
Attempted to call report call, Rn available

## 2014-12-15 NOTE — H&P (Signed)
[facility] History and Physical  [name] JJO:[id number] DOB: 1951-02-28 DOA: 12/15/2014  Referring physician: EDP PCP: Emily Koyanagi, DO   Chief Complaint: Fall, hip pain   HPI: [name] is a 63 y.o. female who was leaving the hospital after bringing her husband in (husband is currently admitted for ITP).  Suffered mechanical fall in the parking lot, had immediate LEFT hip pain, deformity, and inability to bear weight or stand after fall.  Brought in to ED.  Pain worse with movement, better at rest.  Pain is severe.  Found to have intertrochanteric LEFT hip fracture.  Review of Systems: Systems reviewed.  As above, otherwise negative  Past Medical History  Diagnosis Date  . 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 Chickenpox   . Genital warts   . Environmental allergies   . Heart murmur   . HTN (hypertension)   . Hyperlipidemia   . Thyroid disease     Hypothyroid  . Thyroid nodule    Past Surgical History  Procedure Laterality Date  . Carpal tunnel release      Both wrist  . Cystectomy      From the cervix   Social History:  reports that she has never smoked. She has never used smokeless tobacco. She reports that she drinks alcohol. She reports that she does not use illicit drugs.  Allergies  Allergen Reactions  . Statins Diarrhea    Family History  Problem Relation Age of Onset  . Arthritis Mother   . Hyperlipidemia Mother   . Heart disease Father   . Stroke Father     x's 2  . High blood pressure Father   . High blood pressure Mother   . Diabetes Mother   . Myasthenia gravis Mother      Prior to Admission medications   Medication Sig Start Date End Date Taking? Authorizing Provider  azelastine (ASTELIN) 0.1 % nasal spray Place 1 spray into both nostrils 2 (two) times daily. Use in each nostril as directed 08/04/14   Rosalita Chessman, DO  Clocortolone Pivalate (CLODERM) 0.1 % cream Apply 1 application topically 2 (two) times  daily.    Historical Provider, MD  fluticasone (FLONASE) 50 MCG/ACT nasal spray Place 2 sprays into both nostrils daily. 08/04/14   Rosalita Chessman, DO  levothyroxine (SYNTHROID) 88 MCG tablet Take 1 tablet (88 mcg total) by mouth daily. 08/04/14   Rosalita Chessman, DO  NONFORMULARY OR COMPOUNDED ITEM Knee sleeve #1  As directed 11/01/13   Rosalita Chessman, DO  valsartan (DIOVAN) 320 MG tablet TAKE 1 TABLET DAILY 08/04/14   Rosalita Chessman, DO   Physical Exam: Filed Vitals:   12/15/14 1729  BP: 158/91  Pulse: 118  Temp: 98.1 F (36.7 C)  Resp: 22    BP 158/91 mmHg  Pulse 118  Temp(Src) 98.1 F (36.7 C) (Oral)  Resp 22  SpO2 96%  General Appearance:    Alert, oriented, no distress, appears stated age  Head:    Normocephalic, atraumatic  Eyes:    PERRL, EOMI, sclera non-icteric        Nose:   Nares without drainage or epistaxis. Mucosa, turbinates normal  Throat:   Moist mucous membranes. Oropharynx without erythema or exudate.  Neck:   Supple. No carotid bruits.  No thyromegaly.  No lymphadenopathy.   Back:     No CVA tenderness, no spinal tenderness  Lungs:     Clear to  auscultation bilaterally, without wheezes, rhonchi or rales  Chest wall:    No tenderness to palpitation  Heart:    Regular rate and rhythm without murmurs, gallops, rubs  Abdomen:     Soft, non-tender, nondistended, normal bowel sounds, no organomegaly  Genitalia:    deferred  Rectal:    deferred  Extremities:   No clubbing, cyanosis or edema.  Pulses:   2+ and symmetric all extremities  Skin:   Skin color, texture, turgor normal, no rashes or lesions  Lymph nodes:   Cervical, supraclavicular, and axillary nodes normal  Neurologic:   CNII-XII intact. Normal strength, sensation and reflexes      throughout    Labs on Admission:  Basic Metabolic Panel:  Recent Labs Lab 12/15/14 1818  NA 136*  K 4.0  CL 97  CO2 21  GLUCOSE 168*  BUN 16  CREATININE 0.88  CALCIUM 10.1   Liver Function Tests:  Recent  Labs Lab 12/15/14 1818  AST 31  ALT 35  ALKPHOS 54  BILITOT 0.4  PROT 7.6  ALBUMIN 4.2   No results for input(s): LIPASE, AMYLASE in the last 168 hours. No results for input(s): AMMONIA in the last 168 hours. CBC:  Recent Labs Lab 12/15/14 1818  WBC 13.9*  NEUTROABS 9.7*  HGB 13.4  HCT 40.5  MCV 87.7  PLT 313   Cardiac Enzymes: No results for input(s): CKTOTAL, CKMB, CKMBINDEX, TROPONINI in the last 168 hours.  BNP (last 3 results) No results for input(s): PROBNP in the last 8760 hours. CBG: No results for input(s): GLUCAP in the last 168 hours.  Radiological Exams on Admission: Dg Chest 1 View  12/15/2014   CLINICAL DATA:  Preoperative hip surgery.  Hypertension  EXAM: CHEST - 1 VIEW  COMPARISON:  None.  FINDINGS: Lungs are clear. Heart size and pulmonary vascularity are normal. No adenopathy.  There is a sclerotic lesion in the proximal right humerus, incompletely visualized.  IMPRESSION: No edema or consolidation. Sclerotic lesion proximal right humerus, incompletely visualized. This lesion most likely represents either a bone infarct or enchondroma. Right humerus radiographs to better characterize this lesion advised.   Electronically Signed   By: Lowella Grip M.D.   On: 12/15/2014 20:01   Dg Hip Complete Left  12/15/2014   CLINICAL DATA:  Fall in parking lot. Left hip pain. Initial encounter.  EXAM: LEFT HIP - COMPLETE 2+ VIEW  COMPARISON:  None.  FINDINGS: The bones appear adequately mineralized. There is an acute comminuted and angulated intertrochanteric left femur fracture. The femoral head appears intact. There is no dislocation or evidence of pelvic fracture. There are mild underlying degenerative changes of both hips and sacroiliac joints.  IMPRESSION: Acute, comminuted and angulated intertrochanteric left femur fracture.   Electronically Signed   By: Camie Patience M.D.   On: 12/15/2014 19:19    EKG: Independently reviewed.  Assessment/Plan Principal  Problem:   Closed left hip fracture Active Problems:   HTN (hypertension)   Hypothyroidism   Lesion of right humerus   1. Closed left hip fracture - headed to OR for ORIF now 1. Lovenox ordered for 1800 tomorrow 2. NPO 3. No cardiac history other than HTN, no pulmonary history.  No cardiopulmonary symptoms. 2. Lesion of right humerus - sclerotic lesion of R shoulder, incidentally found on CXR, getting R humerus X rays. 3. HTN - continue diovan 4. Hypothyroidism - continue synthroid    Code Status: Full  Family Communication: Friends are at bedside, her husband  is presently in hospital room 1340 with ITP, spoke with surgeon on phone. Disposition Plan: Admit to inpatient   Time spent: 70 min  GARDNER, JARED M. [facility] Pager [phone]  If 7AM-7PM, please contact the day team taking care of the patient [email or web address] Password [facility] 12/15/2014, 8:24 PM

## 2014-12-15 NOTE — Anesthesia Preprocedure Evaluation (Addendum)
Anesthesia Evaluation  Patient identified by MRN, date of birth, ID band Patient awake    Reviewed: Allergy & Precautions, H&P , NPO status , Patient's Chart, lab work & pertinent test results  Airway Mallampati: II  TM Distance: >3 FB Neck ROM: Full    Dental no notable dental hx.    Pulmonary neg pulmonary ROS,  breath sounds clear to auscultation  Pulmonary exam normal       Cardiovascular hypertension, Pt. on medications Rhythm:Regular Rate:Normal     Neuro/Psych negative neurological ROS  negative psych ROS   GI/Hepatic negative GI ROS, Neg liver ROS,   Endo/Other  negative endocrine ROSHypothyroidism   Renal/GU negative Renal ROS  negative genitourinary   Musculoskeletal negative musculoskeletal ROS (+) Arthritis -,   Abdominal   Peds negative pediatric ROS (+)  Hematology negative hematology ROS (+)   Anesthesia Other Findings   Reproductive/Obstetrics negative OB ROS                            Anesthesia Physical Anesthesia Plan  ASA: II and emergent  Anesthesia Plan: General   Post-op Pain Management:    Induction: Intravenous  Airway Management Planned: Oral ETT  Additional Equipment:   Intra-op Plan:   Post-operative Plan: Extubation in OR  Informed Consent: I have reviewed the patients History and Physical, chart, labs and discussed the procedure including the risks, benefits and alternatives for the proposed anesthesia with the patient or authorized representative who has indicated his/her understanding and acceptance.   Dental advisory given  Plan Discussed with: CRNA  Anesthesia Plan Comments: (Discussed spinal and general. She consents to general.)       Anesthesia Quick Evaluation

## 2014-12-16 DIAGNOSIS — S7223XA Displaced subtrochanteric fracture of unspecified femur, initial encounter for closed fracture: Secondary | ICD-10-CM | POA: Diagnosis present

## 2014-12-16 DIAGNOSIS — N3000 Acute cystitis without hematuria: Secondary | ICD-10-CM

## 2014-12-16 DIAGNOSIS — D72829 Elevated white blood cell count, unspecified: Secondary | ICD-10-CM

## 2014-12-16 DIAGNOSIS — S72009A Fracture of unspecified part of neck of unspecified femur, initial encounter for closed fracture: Secondary | ICD-10-CM | POA: Diagnosis present

## 2014-12-16 DIAGNOSIS — N390 Urinary tract infection, site not specified: Secondary | ICD-10-CM | POA: Diagnosis present

## 2014-12-16 LAB — BASIC METABOLIC PANEL
Anion gap: 8 (ref 5–15)
BUN: 19 mg/dL (ref 6–23)
CALCIUM: 8.6 mg/dL (ref 8.4–10.5)
CO2: 24 mmol/L (ref 19–32)
Chloride: 104 mEq/L (ref 96–112)
Creatinine, Ser: 0.8 mg/dL (ref 0.50–1.10)
GFR calc Af Amer: 90 mL/min — ABNORMAL LOW (ref >90)
GFR, EST NON AFRICAN AMERICAN: 77 mL/min — AB (ref >90)
Glucose, Bld: 181 mg/dL — ABNORMAL HIGH (ref 70–99)
POTASSIUM: 4.5 mmol/L (ref 3.5–5.1)
SODIUM: 136 mmol/L (ref 135–145)

## 2014-12-16 LAB — CBC
HEMATOCRIT: 35 % — AB (ref 36.0–46.0)
Hemoglobin: 11.1 g/dL — ABNORMAL LOW (ref 12.0–15.0)
MCH: 28.3 pg (ref 26.0–34.0)
MCHC: 31.7 g/dL (ref 30.0–36.0)
MCV: 89.3 fL (ref 78.0–100.0)
Platelets: 302 10*3/uL (ref 150–400)
RBC: 3.92 MIL/uL (ref 3.87–5.11)
RDW: 13.5 % (ref 11.5–15.5)
WBC: 16.5 10*3/uL — ABNORMAL HIGH (ref 4.0–10.5)

## 2014-12-16 MED ORDER — ACETAMINOPHEN 325 MG PO TABS: 650.0000 mg | ORAL_TABLET | Freq: Four times a day (QID) | ORAL | Status: DC | PRN

## 2014-12-16 MED ORDER — NEOSTIGMINE METHYLSULFATE 10 MG/10ML IV SOLN
INTRAVENOUS | Status: DC | PRN
  Administered 2014-12-16: 3 mg via INTRAVENOUS

## 2014-12-16 MED ORDER — ENOXAPARIN SODIUM 40 MG/0.4ML ~~LOC~~ SOLN: 40.0000 mg | SUBCUTANEOUS | Status: DC

## 2014-12-16 MED ORDER — METHOCARBAMOL 500 MG PO TABS
500.0000 mg | ORAL_TABLET | Freq: Four times a day (QID) | ORAL | Status: DC | PRN
  Administered 2014-12-16 – 2014-12-17 (×4): 500 mg via ORAL
  Filled 2014-12-16 (×4): qty 1

## 2014-12-16 MED ORDER — IBUPROFEN 200 MG PO TABS
200.0000 mg | ORAL_TABLET | Freq: Four times a day (QID) | ORAL | Status: DC | PRN
  Administered 2014-12-16: 600 mg via ORAL
  Administered 2014-12-17: 800 mg via ORAL
  Filled 2014-12-16: qty 4
  Filled 2014-12-16: qty 3
  Filled 2014-12-16: qty 4

## 2014-12-16 MED ORDER — METOCLOPRAMIDE HCL 5 MG/ML IJ SOLN: 5.0000 mg | Freq: Three times a day (TID) | INTRAMUSCULAR | Status: DC | PRN

## 2014-12-16 MED ORDER — VITAMIN D3 25 MCG (1000 UNIT) PO TABS
1000.0000 [IU] | ORAL_TABLET | ORAL | Status: DC
  Administered 2014-12-17: 1000 [IU] via ORAL
  Filled 2014-12-16 (×2): qty 1

## 2014-12-16 MED ORDER — CEFTRIAXONE SODIUM IN DEXTROSE 20 MG/ML IV SOLN
1.0000 g | INTRAVENOUS | Status: DC
  Administered 2014-12-16 – 2014-12-17 (×2): 1 g via INTRAVENOUS
  Filled 2014-12-16 (×2): qty 50

## 2014-12-16 MED ORDER — HYDROMORPHONE HCL 1 MG/ML IJ SOLN
INTRAMUSCULAR | Status: AC
  Filled 2014-12-16: qty 1

## 2014-12-16 MED ORDER — VITAMIN D (ERGOCALCIFEROL) 1.25 MG (50000 UNIT) PO CAPS
50000.0000 [IU] | ORAL_CAPSULE | ORAL | Status: DC
  Administered 2014-12-17: 50000 [IU] via ORAL
  Filled 2014-12-16 (×2): qty 1

## 2014-12-16 MED ORDER — POLYETHYLENE GLYCOL 3350 17 G PO PACK
17.0000 g | PACK | Freq: Every day | ORAL | Status: DC | PRN
  Administered 2014-12-17: 17 g via ORAL
  Filled 2014-12-16: qty 1

## 2014-12-16 MED ORDER — ONDANSETRON HCL 4 MG/2ML IJ SOLN
4.0000 mg | Freq: Four times a day (QID) | INTRAMUSCULAR | Status: DC | PRN
  Administered 2014-12-17 – 2014-12-18 (×2): 4 mg via INTRAVENOUS
  Filled 2014-12-16 (×2): qty 2

## 2014-12-16 MED ORDER — METOCLOPRAMIDE HCL 10 MG PO TABS
5.0000 mg | ORAL_TABLET | Freq: Three times a day (TID) | ORAL | Status: DC | PRN
  Administered 2014-12-18: 10 mg via ORAL
  Filled 2014-12-16: qty 1

## 2014-12-16 MED ORDER — ACETAMINOPHEN 650 MG RE SUPP: 650.0000 mg | Freq: Four times a day (QID) | RECTAL | Status: DC | PRN

## 2014-12-16 MED ORDER — METHOCARBAMOL 1000 MG/10ML IJ SOLN
500.0000 mg | Freq: Four times a day (QID) | INTRAVENOUS | Status: DC | PRN
  Administered 2014-12-16: 500 mg via INTRAVENOUS
  Filled 2014-12-16 (×2): qty 5

## 2014-12-16 MED ORDER — PHENOL 1.4 % MT LIQD: 1.0000 | OROMUCOSAL | Status: DC | PRN

## 2014-12-16 MED ORDER — CEFAZOLIN SODIUM-DEXTROSE 2-3 GM-% IV SOLR
2.0000 g | Freq: Four times a day (QID) | INTRAVENOUS | Status: AC
  Administered 2014-12-16 (×2): 2 g via INTRAVENOUS
  Filled 2014-12-16 (×2): qty 50

## 2014-12-16 MED ORDER — BOOST / RESOURCE BREEZE PO LIQD
1.0000 | Freq: Two times a day (BID) | ORAL | Status: DC
Start: 2014-12-16 — End: 2014-12-18
  Administered 2014-12-16 – 2014-12-18 (×3): 1 via ORAL

## 2014-12-16 MED ORDER — HYDROCODONE-ACETAMINOPHEN 5-325 MG PO TABS
1.0000 | ORAL_TABLET | Freq: Four times a day (QID) | ORAL | Status: DC | PRN
  Administered 2014-12-16: 1 via ORAL
  Administered 2014-12-16 – 2014-12-18 (×9): 2 via ORAL
  Filled 2014-12-16 (×5): qty 2
  Filled 2014-12-16: qty 1
  Filled 2014-12-16 (×5): qty 2

## 2014-12-16 MED ORDER — GLYCOPYRROLATE 0.2 MG/ML IJ SOLN
INTRAMUSCULAR | Status: DC | PRN
  Administered 2014-12-16: .4 mg via INTRAVENOUS

## 2014-12-16 MED ORDER — MENTHOL 3 MG MT LOZG: 1.0000 | LOZENGE | OROMUCOSAL | Status: DC | PRN

## 2014-12-16 MED ORDER — ACETAMINOPHEN 325 MG PO TABS: 650.0000 mg | ORAL_TABLET | Freq: Four times a day (QID) | ORAL | Status: AC | PRN

## 2014-12-16 MED ORDER — ONDANSETRON HCL 4 MG PO TABS: 4.0000 mg | ORAL_TABLET | Freq: Four times a day (QID) | ORAL | Status: DC | PRN

## 2014-12-16 MED ORDER — ONDANSETRON HCL 4 MG/2ML IJ SOLN
INTRAMUSCULAR | Status: DC | PRN
  Administered 2014-12-16: 4 mg via INTRAVENOUS

## 2014-12-16 MED ORDER — MORPHINE SULFATE 2 MG/ML IJ SOLN: 0.5000 mg | INTRAMUSCULAR | Status: DC | PRN

## 2014-12-16 MED ORDER — SODIUM CHLORIDE 0.9 % IV SOLN
1000.0000 mL | INTRAVENOUS | Status: DC
  Administered 2014-12-16 – 2014-12-17 (×2): 1000 mL via INTRAVENOUS

## 2014-12-16 MED ORDER — ENOXAPARIN SODIUM 40 MG/0.4ML ~~LOC~~ SOLN
40.0000 mg | SUBCUTANEOUS | Status: DC
  Administered 2014-12-16 – 2014-12-17 (×2): 40 mg via SUBCUTANEOUS
  Filled 2014-12-16 (×3): qty 0.4

## 2014-12-16 NOTE — Op Note (Signed)
NAMESHERICA, PATERNOSTRO NO.:  [id number]  MEDICAL RECORD NO.:  [id number]  LOCATION:  56                         FACILITY:  [facility] [place]  PHYSICIAN:  Doran Heater. Veverly Fells, M.D. DATE OF BIRTH:  10/08/1951  DATE OF PROCEDURE:  12/15/2014 DATE OF DISCHARGE:                              OPERATIVE REPORT   PREOPERATIVE DIAGNOSIS:  Left comminuted displaced peritrochanteric femur fracture.  POSTOPERATIVE DIAGNOSIS:  Left comminuted displaced peritrochanteric femur fracture.  PROCEDURE PERFORMED:  Open reduction and internal fixation with Biomet intramedullary nail.  ATTENDING SURGEON:  Doran Heater. Veverly Fells, MD.  ASSISTANT:  Abbott Pao. Dixon, PAC, who scrubbed the entire procedure and necessary for satisfactory completion of surgery.  ANESTHESIA:  General anesthesia was used.  ESTIMATED BLOOD LOSS:  300 mL.  FLUID REPLACEMENT:  1500 mL crystalloids.  INSTRUMENT COUNTS:  Correct.  COMPLICATIONS:  There were no complications.  ANTIBIOTICS:  Perioperative antibiotics were given.  INDICATIONS:  The patient is a 63 year old female, who had a ground- level fall while coming to the hospital to visit her husband, who is an inpatient at [facility].  The patient fell in the parking lot, complained of immediate severe pain in the left hip, unable to ambulate or stand, transported to the Emergency Department, where x-rays demonstrated what appeared to be initially intertrochanteric femur fracture.  The patient was counseled regarding the need for operative fixation of her fracture and was cleared medically for surgery. Informed consent was obtained.  DESCRIPTION OF PROCEDURE:  After an adequate level of anesthesia was achieved, the patient was positioned on the fracture table.  Perineal post utilized. She was secured to the table.  All neurovascular structures were padded appropriately.  Right leg placed in modified lithotomy position and the peroneal nerve and lateral  knee structures padded appropriately.  Pulses were checked once she was positioned. Distal traction boot placed in the left leg.  Left leg placed in traction with internal rotation.  X-ray was brought in demonstrating a very comminuted peritrochanteric femur fracture.  Based on this, we elected to proceed with the long intramedullary nail.  There was also significant displacement of the basilar neck component of this fracture, so essentially there were at least 4 main pieces; the shaft, the lesser trochanter, which was greatly displaced, the greater trochanter which was in multiple pieces, and then the head and neck and the neck fracture pattern was basically a basicervical.  So once we had the fracture line the best that we get it, we called our time-out.  We sterilely prepped and draped and shower curtain applied.  C-arm brought in the field.  We then went ahead and made a longitudinal incision proximal to greater trochanter.  Dissection down through subcutaneous tissues using Bovie, split the fascia, identified the greater trochanter, placed a guide pin through a very comminuted section of greater trochanter into the femur using one of the alignment tools with a Biomet set. We were able to place a guide pin into the distal fragment to measure our length to 34 cm length.  We next went ahead and drilled with our entry reamer and then reamed up for an 11 nail and we reamed to 12.5.  We then placed our 34 cm x 11 mm diameter Biomet long trochanteric nail across the fracture site.  This seemed to improve the reduction of the basicervical component, but it looked to me like on multiple images that the neck and head were kicked over medially.  I made a counter incision at the level of the femoral neck, and over the anterior aspect of the femur, I was able to take a Cobb elevator and slide over the top of that fracture site and actually with the Cobb elevator was able to lever the femoral neck  the basicervical component back into proper position.  With that in position, with the troch nail at the appropriate height, I was able to drill our guide pin into the femoral head, centered it low on the neck for the super lag screw.  Once that guide pin was in position, we checked it on multiplanar imaging, and we were pleased with that.  Then still holding the reduction of the fracture site, we went ahead and placed a second drill bit for the antirotation screw across the fracture site.  Once we did that, we then drilled with the step-cut drill and placed our lag screw which was a 100 mm length lag screw.  The basicervical fracture component did not move, did not rotate. Fortunately, we then replaced the drill bit with the antirotation screw, 15 mm shorter.  We were pleased with the reduction, pleased with screw placements. We then took off our jig and abducted the leg.  With freehand technique and C-arm draped into the field, we placed our 4.5 interlocking screws distally x2 of the appropriate length.  Once those were verified in proper position, we irrigated all wounds thoroughly and closed in layers with 0 Vicryl for the deep layer, 2-0 Vicryl subcutaneous closure, and staples for skin.  Sterile bandage was applied.  The patient was transported safely to recovery room in stable condition having tolerated the surgery well.     Doran Heater. Veverly Fells, M.D.     SRN/MEDQ  D:  12/16/2014  T:  12/16/2014  Job:  [id number]

## 2014-12-16 NOTE — Discharge Instructions (Signed)
Non weight bearing left LE Wheel chair transfers only for now due to concerns over possible WB  Ice to the left hip  Will need SNF rehab

## 2014-12-16 NOTE — Evaluation (Signed)
Occupational Therapy Evaluation Patient Details Name: [name] MRN: [id number] DOB: December 06, 1951 Today's Date: 12/16/2014    History of Present Illness Pt fell in the parking lot of the hospital after visiting her husband resulting in L displaced intertrochanteric fx. S/P IM nail with strict non weightbearing precautions on the LLE.   Clinical Impression   Pt was independent prior to admission. Pt currently requires +2 total assist for bed mobility.  She was able to sit EOB x 8 minutes statically with supervision.  She is dependent in bathing, dressing and toileting.  Pt will need SNF level care upon discharge.  Will follow acutely.    Follow Up Recommendations  SNF;Supervision/Assistance - 24 hour    Equipment Recommendations       Recommendations for Other Services       Precautions / Restrictions Precautions Precautions: Fall Restrictions Weight Bearing Restrictions: Yes LUE Weight Bearing: Non weight bearing      Mobility Bed Mobility Overal bed mobility: +2 for physical assistance;Needs Assistance Bed Mobility: Supine to Sit;Sit to Supine     Supine to sit: +2 for physical assistance;Total assist Sit to supine: +2 for physical assistance;Total assist   General bed mobility comments: assist to advance BLEs and to raise trunk, pt 25%, limited by pain  Transfers                 General transfer comment: NT-due to pain    Balance Overall balance assessment: Needs assistance Sitting-balance support: Feet supported Sitting balance-Leahy Scale: Poor Sitting balance - Comments: initally max assist 2* posterior lean, then progressed to supervision (only R foot supported, pt unable to tolerate lowering LLE to floor). Pt sat on EOB x 8 min.  Postural control: Posterior lean Standing balance support: No upper extremity supported                                ADL Overall ADL's : Needs assistance/impaired Eating/Feeding: Set up;Bed level    Grooming: Set up;Bed level   Upper Body Bathing: Moderate assistance;Bed level   Lower Body Bathing: +2 for physical assistance;Total assistance;Bed level   Upper Body Dressing : Minimal assistance;Bed level   Lower Body Dressing: +2 for physical assistance;Total assistance;Bed level                 General ADL Comments: Pt only able to sit statically, unable to simultaneously use her UEs for ADL.     Vision                 Additional Comments: wears glasses   Perception     Praxis      Pertinent Vitals/Pain Pain Assessment: Faces Faces Pain Scale: Hurts whole lot Pain Location: L hip Pain Descriptors / Indicators: Operative site guarding;Aching Pain Intervention(s): Monitored during session;Limited activity within patient's tolerance;Premedicated before session;Repositioned;Ice applied     Hand Dominance Right   Extremity/Trunk Assessment Upper Extremity Assessment Upper Extremity Assessment: Overall WFL for tasks assessed   Lower Extremity Assessment Lower Extremity Assessment: LLE deficits/detail LLE Deficits / Details: able to actively PF/DF ankle, did not tolerate PROM of hip or knee 2* pain LLE: Unable to fully assess due to pain   Cervical / Trunk Assessment Cervical / Trunk Assessment: Normal   Communication Communication Communication: No difficulties   Cognition Arousal/Alertness: Awake/alert Behavior During Therapy: WFL for tasks assessed/performed Overall Cognitive Status: Within Functional Limits for tasks assessed  General Comments       Exercises       Shoulder Instructions      Home Living Family/patient expects to be discharged to:: Skilled nursing facility Living Arrangements: Spouse/significant other                                      Prior Functioning/Environment Level of Independence: Independent        Comments: has a cane, but was not using prior    OT Diagnosis:  Generalized weakness;Acute pain   OT Problem List: Decreased strength;Decreased activity tolerance;Impaired balance (sitting and/or standing);Decreased knowledge of use of DME or AE;Decreased safety awareness;Obesity;Pain   OT Treatment/Interventions: Self-care/ADL training;DME and/or AE instruction;Balance training;Patient/family education;Therapeutic activities    OT Goals(Current goals can be found in the care plan section) Acute Rehab OT Goals Patient Stated Goal: return to work OT Goal Formulation: With patient Time For Goal Achievement: 12/30/14 Potential to Achieve Goals: Good ADL Goals Pt Will Perform Grooming: with supervision;sitting Pt Will Perform Upper Body Bathing: with supervision;sitting Pt Will Perform Upper Body Dressing: with supervision;sitting Pt Will Transfer to Toilet: with +2 assist;with min assist;stand pivot transfer;bedside commode Additional ADL Goal #1: Pt will perform bed mobility with moderate assist in preparation for ADL at EOB.  OT Frequency: Min 2X/week   Barriers to D/C: Decreased caregiver support          Co-evaluation PT/OT/SLP Co-Evaluation/Treatment: Yes Reason for Co-Treatment: For patient/therapist safety   OT goals addressed during session: ADL's and self-care      End of Session Nurse Communication: Mobility status  Activity Tolerance: Patient limited by pain Patient left: in bed;with call bell/phone within reach   Time: 1140-1206 OT Time Calculation (min): 26 min Charges:  OT General Charges $OT Visit: 1 Procedure OT Evaluation $Initial OT Evaluation Tier I: 1 Procedure OT Treatments $Self Care/Home Management : 8-22 mins G-Codes:    Malka So 12/16/2014, 12:21 PM  [phone]

## 2014-12-16 NOTE — Progress Notes (Signed)
[facility] PROGRESS NOTE  Feiga Nadel JOA:[id number] DOB: 02/01/1951 DOA: 12/15/2014 PCP: Garnet Koyanagi, DO  Assessment/Plan: #1 left comminuted displaced peritrochanteric femur fracture Secondary to mechanical fall. Status post ORIF with IM nail per orthopedics Dr. Veverly Fells to 12/16/2014. PT/OT. Pain management. Per orthopedics.  #2 leukocytosis Likely secondary to UTI. Check urine cultures. Place on IV Rocephin.  #3 urinary tract infection Check a urine cultures. Place on IV Rocephin.  #4 hypertension Continue Avapro.  #5 hypothyroidism Continue Synthroid.  #6 right humeral lesion X-ray of the right shoulder with probable enchondroma or bone island likely benign. Outpatient follow-up.  #7 prophylaxis Lovenox for DVT prophylaxis.  Code Status: Full Family Communication: Updated patient no family at bedside. Disposition Plan: Likely need skilled nursing facility on discharge.   Consultants:  Orthopedics: Dr. Veverly Fells 12/15/2014  Procedures:  ORIF with Biomet intramedullary nail left hip 12/16/2014 per Dr. Veverly Fells  Chest x-ray 12/15/2014  X-ray of the left hip 12/15/2014  X-ray of the right shoulder 12/15/2014  Antibiotics:  IV Rocephin 12/16/2014  HPI/Subjective: Patient complaining of left hip pain. Patient denies any chest pain. No shortness of breath.  Objective: Filed Vitals:   12/16/14 0917  BP: 155/72  Pulse: 88  Temp: 97.3 F (36.3 C)  Resp: 14    Intake/Output Summary (Last 24 hours) at 12/16/14 1012 Last data filed at 12/16/14 0700  Gross per 24 hour  Intake 2996.25 ml  Output    535 ml  Net 2461.25 ml   Filed Weights   12/16/14 0206  Weight: 87.544 kg (193 lb)    Exam:   General:  NAD  Cardiovascular: RRR  Respiratory: CTA B anterior lung fields.  Abdomen: Soft, nontender, nondistended, positive bowel sounds.  Musculoskeletal: No clubbing cyanosis or edema  Data Reviewed: Basic Metabolic Panel:  Recent Labs Lab  12/15/14 1818 12/16/14 0504  NA 136* 136  K 4.0 4.5  CL 97 104  CO2 21 24  GLUCOSE 168* 181*  BUN 16 19  CREATININE 0.88 0.80  CALCIUM 10.1 8.6   Liver Function Tests:  Recent Labs Lab 12/15/14 1818  AST 31  ALT 35  ALKPHOS 54  BILITOT 0.4  PROT 7.6  ALBUMIN 4.2   No results for input(s): LIPASE, AMYLASE in the last 168 hours. No results for input(s): AMMONIA in the last 168 hours. CBC:  Recent Labs Lab 12/15/14 1818 12/16/14 0504  WBC 13.9* 16.5*  NEUTROABS 9.7*  --   HGB 13.4 11.1*  HCT 40.5 35.0*  MCV 87.7 89.3  PLT 313 302   Cardiac Enzymes: No results for input(s): CKTOTAL, CKMB, CKMBINDEX, TROPONINI in the last 168 hours. BNP (last 3 results) No results for input(s): PROBNP in the last 8760 hours. CBG: No results for input(s): GLUCAP in the last 168 hours.  No results found for this or any previous visit (from the past 240 hour(s)).   Studies: Dg Chest 1 View  12/15/2014   CLINICAL DATA:  Preoperative hip surgery.  Hypertension  EXAM: CHEST - 1 VIEW  COMPARISON:  None.  FINDINGS: Lungs are clear. Heart size and pulmonary vascularity are normal. No adenopathy.  There is a sclerotic lesion in the proximal right humerus, incompletely visualized.  IMPRESSION: No edema or consolidation. Sclerotic lesion proximal right humerus, incompletely visualized. This lesion most likely represents either a bone infarct or enchondroma. Right humerus radiographs to better characterize this lesion advised.   Electronically Signed   By: Lowella Grip M.D.   On: 12/15/2014 20:01  Dg Hip Complete Left  12/15/2014   CLINICAL DATA:  Fall in parking lot. Left hip pain. Initial encounter.  EXAM: LEFT HIP - COMPLETE 2+ VIEW  COMPARISON:  None.  FINDINGS: The bones appear adequately mineralized. There is an acute comminuted and angulated intertrochanteric left femur fracture. The femoral head appears intact. There is no dislocation or evidence of pelvic fracture. There are mild  underlying degenerative changes of both hips and sacroiliac joints.  IMPRESSION: Acute, comminuted and angulated intertrochanteric left femur fracture.   Electronically Signed   By: Camie Patience M.D.   On: 12/15/2014 19:19   Dg Hip Operative Left  12/16/2014   CLINICAL DATA:  Internal fixation of left femoral intertrochanteric fracture. Intraoperative radiographs. Subsequent encounter.  EXAM: OPERATIVE LEFT HIP  COMPARISON:  Left hip radiographs performed earlier today at 6:46 p.m.  FINDINGS: Five fluoroscopic C-arm images demonstrate successful placement of an intramedullary rod and screws across the patient's comminuted intertrochanteric fracture of the proximal left femur. A displaced lesser trochanteric fragment is again seen. The fracture is noted in near anatomic alignment. No new fractures are identified. The left femoral head remains seated at the acetabulum.  IMPRESSION: Successful internal fixation of left femoral comminuted intertrochanteric fracture in near anatomic alignment.   Electronically Signed   By: Garald Balding M.D.   On: 12/16/2014 00:50   Dg Shoulder Right Port  12/15/2014   CLINICAL DATA:  Right humeral lesion on prior exam, no trauma or shoulder symptoms  EXAM: PORTABLE RIGHT SHOULDER - 2+ VIEW  COMPARISON:  Chest radiograph 12/15/2014 is reviewed. There is no other prior imaging at this institution for comparison.  FINDINGS: Sclerotic lesion within the proximal right humerus is reidentified. No fracture or dislocation. Right lung apex is clear.  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 No acute abnormality. Unless the patient has right upper extremity symptoms, no further specific imaging followup is likely required for this probably benign finding.   Electronically Signed   By: Conchita Paris M.D.   On: 12/15/2014 21:08    Scheduled Meds: .  ceFAZolin (ANCEF) IV  2 g Intravenous Q6H  . cefTRIAXone (ROCEPHIN)  IV  1 g Intravenous Q24H  .  [START ON 12/17/2014] cholecalciferol  1,000 Units Oral Once per day on Mon Wed Fri  . enoxaparin (LOVENOX) injection  40 mg Subcutaneous Q24H  . fluticasone  2 spray Each Nare Daily  . HYDROmorphone      . HYDROmorphone      . irbesartan  300 mg Oral Daily  . levothyroxine  88 mcg Oral QAC breakfast  . [START ON 12/17/2014] Vitamin D (Ergocalciferol)  50,000 Units Oral Once per day on Mon Wed Fri   Continuous Infusions: . sodium chloride      Principal Problem:   Closed left hip fracture Active Problems:   HTN (hypertension)   Hypothyroidism   Lesion of right humerus   Subtrochanteric fracture of femur   UTI (urinary tract infection)   Leukocytosis    Time spent: 63 minutes    Masyn Fullam M.D. [facility] Pager [phone]. If 7PM-7AM, please contact night-coverage at [email or web address], password [facility] 12/16/2014, 10:12 AM  LOS: 1 day

## 2014-12-16 NOTE — Progress Notes (Signed)
INITIAL NUTRITION ASSESSMENT  DOCUMENTATION CODES Per approved criteria  -Obesity Unspecified   INTERVENTION: -Provide Resource Breeze po BID, each supplement provides 250 kcal and 9 grams of protein -Placed dinner and breakfast orders: SOY, WHEAT, EGGS, PEANUT ALLERGY -Encouraged PO intake -RD to continue to monitor  NUTRITION DIAGNOSIS: Increased nutrient needs related to Hip Fracture as evidenced by estimated nutrient needs.   Goal: Pt to meet >/= 90% of their estimated nutrition needs   Monitor:  PO and supplemental intake, weight, labs, I/O's  Reason for Assessment: Consulted regarding Hip Fracture  Admitting Dx: Closed left hip fracture  ASSESSMENT: 63 y.o. Female suffered mechanical fall in the parking lot, had immediate LEFT hip pain, and found to have intertrochanteric LEFT hip fracture.   S/p Procedure(s) (LRB): INTRAMEDULLARY (IM) NAIL INTERTROCHANTRIC   Pt reports no weight loss, states she has had some weight gain, UBW of 170 lb. Pt states that she has been "overeating".  Pt reports multiple food allergies, which include: Soy, soy lecithin, eggs, wheat, and peanuts. Pt states that her husband follows a gluten free diet at home.   Pt is willing to try Resource Breeze supplement BID, supplement is soy, wheat free. RD to order.  RD placed order for dinner and tomorrow's breakfast for patient.  Nutrition focused physical exam shows no sign of depletion of muscle mass or body fat.  Labs reviewed: Glucose 181  Height: Ht Readings from Last 1 Encounters:  12/16/14 5\' 5"  (1.651 m)    Weight: Wt Readings from Last 1 Encounters:  12/16/14 193 lb (87.544 kg)    Ideal Body Weight: 125 lb  % Ideal Body Weight: 154%  Wt Readings from Last 10 Encounters:  12/16/14 193 lb (87.544 kg)  08/04/14 191 lb 12.8 oz (87 kg)  01/01/14 193 lb (87.544 kg)  11/01/13 190 lb (86.183 kg)  07/16/13 181 lb (82.101 kg)    Usual Body Weight: 170 lb  % Usual Body Weight:  114%  BMI:  Body mass index is 32.12 kg/(m^2).  Estimated Nutritional Needs: Kcal: 1900-2100 Protein: 85-95g Fluid: 1.9L/day  Skin: hip incision  Diet Order: Diet regular  EDUCATION NEEDS: -Education needs addressed   Intake/Output Summary (Last 24 hours) at 12/16/14 1409 Last data filed at 12/16/14 0700  Gross per 24 hour  Intake 2996.25 ml  Output    535 ml  Net 2461.25 ml    Last BM: 12/21  Labs:   Recent Labs Lab 12/15/14 1818 12/16/14 0504  NA 136* 136  K 4.0 4.5  CL 97 104  CO2 21 24  BUN 16 19  CREATININE 0.88 0.80  CALCIUM 10.1 8.6  GLUCOSE 168* 181*    CBG (last 3)  No results for input(s): GLUCAP in the last 72 hours.  Scheduled Meds: . cefTRIAXone (ROCEPHIN)  IV  1 g Intravenous Q24H  . [START ON 12/17/2014] cholecalciferol  1,000 Units Oral Once per day on Mon Wed Fri  . enoxaparin (LOVENOX) injection  40 mg Subcutaneous Q24H  . fluticasone  2 spray Each Nare Daily  . irbesartan  300 mg Oral Daily  . levothyroxine  88 mcg Oral QAC breakfast  . [START ON 12/17/2014] Vitamin D (Ergocalciferol)  50,000 Units Oral Once per day on Mon Wed Fri    Continuous Infusions: . sodium chloride 1,000 mL (12/16/14 1105)    Past Medical History  Diagnosis Date  . 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 Chickenpox   . Genital  warts   . Environmental allergies   . Heart murmur   . HTN (hypertension)   . Hyperlipidemia   . Thyroid disease     Hypothyroid  . Thyroid nodule     Past Surgical History  Procedure Laterality Date  . Carpal tunnel release      Both wrist  . Cystectomy      From the cervix    Clayton Bibles, MS, RD, LDN Pager: [phone] After Hours Pager: [phone]

## 2014-12-16 NOTE — Anesthesia Postprocedure Evaluation (Signed)
  Anesthesia Post-op Note  Patient: [name]  Procedure(s) Performed: Procedure(s) (LRB): INTRAMEDULLARY (IM) NAIL INTERTROCHANTRIC (Left)  Patient Location: PACU  Anesthesia Type: General  Level of Consciousness: awake and alert   Airway and Oxygen Therapy: Patient Spontanous Breathing  Post-op Pain: mild  Post-op Assessment: Post-op Vital signs reviewed, Patient's Cardiovascular Status Stable, Respiratory Function Stable, Patent Airway and No signs of Nausea or vomiting  Last Vitals:  Filed Vitals:   12/16/14 0600  BP: 117/68  Pulse: 88  Temp: 36.8 C  Resp: 16    Post-op Vital Signs: stable   Complications: No apparent anesthesia complications

## 2014-12-16 NOTE — Progress Notes (Addendum)
   Subjective: 1 Day Post-Op Procedure(s) (LRB): INTRAMEDULLARY (IM) NAIL INTERTROCHANTRIC (Left)  Pt c/o mild to moderate pain especially with movement Discussed that she has a baseline problem with balance Will work with therapy and strict non weight bearing left lower extremity Patient reports pain as moderate.  Objective:   VITALS:   Filed Vitals:   12/16/14 0917  BP: 155/72  Pulse: 88  Temp: 97.3 F (36.3 C)  Resp: 14    Left hip incision healing well nv intact distally No rashes  Mild edema distally  LABS  Recent Labs  12/15/14 1818 12/16/14 0504  HGB 13.4 11.1*  HCT 40.5 35.0*  WBC 13.9* 16.5*  PLT 313 302     Recent Labs  12/15/14 1818 12/16/14 0504  NA 136* 136  K 4.0 4.5  BUN 16 19  CREATININE 0.88 0.80  GLUCOSE 168* 181*     Assessment/Plan: 1 Day Post-Op Procedure(s) (LRB): INTRAMEDULLARY (IM) NAIL INTERTROCHANTRIC (Left) PT/OT - non weight bearing left lower extremity Pain control Pulmonary toilet D/c planning     Merla Riches, MPAS, PA-C  12/16/2014, 10:16 AM  Patient looks great this afternoon.  This is a long process and the patient understands and will be patient. Transfers only for now, D/C planning in progress. Agree with above assessment and plan.  Netta Cedars MD

## 2014-12-16 NOTE — Evaluation (Addendum)
Physical Therapy Evaluation Patient Details Name: [name] MRN: [id number] DOB: 08/18/1951 Today's Date: 12/16/2014   History of Present Illness  Pt fell in the parking lot of the hospital after visiting her husband resulting in L displaced intertrochanteric fx. S/P IM nail with strict non weightbearing precautions on the LLE.  Clinical Impression  *Pt admitted with above diagnosis. Pt currently with functional limitations due to the deficits listed below (see PT Problem List). * Pt will benefit from skilled PT to increase their independence and safety with mobility to allow discharge to the venue listed below.    +2 total assist for supine to sit. Pt sat on EOB for 8 min with max assist progressing to supervision. L hip pain significantly limits activity tolerance.  **    Follow Up Recommendations SNF    Equipment Recommendations  Wheelchair (measurements PT);Rolling walker with 5" wheels    Recommendations for Other Services OT consult     Precautions / Restrictions Precautions Precautions: Fall Restrictions Weight Bearing Restrictions: Yes LUE Weight Bearing: Non weight bearing      Mobility  Bed Mobility Overal bed mobility: +2 for physical assistance;Needs Assistance Bed Mobility: Supine to Sit;Sit to Supine     Supine to sit: +2 for physical assistance;Total assist Sit to supine: +2 for physical assistance;Total assist   General bed mobility comments: assist to advance BLEs and to raise trunk, pt 25%, limited by pain  Transfers                 General transfer comment: NT-due to pain  Ambulation/Gait                Stairs            Wheelchair Mobility    Modified Rankin (Stroke Patients Only)       Balance Overall balance assessment: Needs assistance Sitting-balance support: Feet supported Sitting balance-Leahy Scale: Poor Sitting balance - Comments: initally max assist 2* posterior lean, then progressed to supervision  (only R foot supported, pt unable to tolerate lowering LLE to floor). Pt sat on EOB x 8 min.  Postural control: Posterior lean Standing balance support: No upper extremity supported                                 Pertinent Vitals/Pain Pain Assessment: Faces Faces Pain Scale: Hurts whole lot Pain Location: L hip Pain Descriptors / Indicators: Operative site guarding;Aching Pain Intervention(s): Monitored during session;Limited activity within patient's tolerance;Premedicated before session;Repositioned;Ice applied    Home Living Family/patient expects to be discharged to:: Skilled nursing facility Living Arrangements: Spouse/significant other                    Prior Function Level of Independence: Independent         Comments: has a cane, but was not using prior     Hand Dominance   Dominant Hand: Right    Extremity/Trunk Assessment   Upper Extremity Assessment: Overall WFL for tasks assessed           Lower Extremity Assessment: LLE deficits/detail   LLE Deficits / Details: able to actively PF/DF ankle, did not tolerate PROM of hip or knee 2* pain  Cervical / Trunk Assessment: Normal  Communication   Communication: No difficulties  Cognition Arousal/Alertness: Awake/alert Behavior During Therapy: WFL for tasks assessed/performed Overall Cognitive Status: Within Functional Limits for tasks assessed  General Comments      Exercises Total Joint Exercises Ankle Circles/Pumps: AROM;Both;10 reps;Supine      Assessment/Plan    PT Assessment Patient needs continued PT services  PT Diagnosis Acute pain;Difficulty walking;Generalized weakness   PT Problem List Decreased strength;Decreased activity tolerance;Decreased balance;Pain;Decreased mobility;Obesity  PT Treatment Interventions DME instruction;Functional mobility training;Therapeutic activities;Patient/family education;Therapeutic exercise   PT Goals  (Current goals can be found in the Care Plan section) Acute Rehab PT Goals Patient Stated Goal: return to work PT Goal Formulation: With patient Time For Goal Achievement: 12/30/14 Potential to Achieve Goals: Good    Frequency Min 4X/week   Barriers to discharge        Co-evaluation   Reason for Co-Treatment: For patient/therapist safety   OT goals addressed during session: ADL's and self-care       End of Session   Activity Tolerance: Patient limited by pain Patient left: in bed;with call bell/phone within reach Nurse Communication: Mobility status         Time: 9244-6286 PT Time Calculation (min) (ACUTE ONLY): 20 min   Charges:   PT Evaluation $Initial PT Evaluation Tier I: 1 Procedure PT Treatments $Therapeutic Activity: 8-22 mins   PT G Codes:          Philomena Doheny 12/16/2014, 12:23 PM  [phone]

## 2014-12-16 NOTE — Brief Op Note (Signed)
12/15/2014 - 12/16/2014  12:37 AM  PATIENT:  [name]  63 y.o. female  PRE-OPERATIVE DIAGNOSIS:  left comminuted displaced peritrochanteric hip fracture  POST-OPERATIVE DIAGNOSIS:  left comminuted displaced peritrochanteric femur fracture   PROCEDURE:  Procedure(s): INTRAMEDULLARY (IM) NAIL INTERTROCHANTRIC (Left) Open Reduction  SURGEON:  Surgeon(s) and Role:    * Augustin Schooling, MD - Primary  PHYSICIAN ASSISTANT:   ASSISTANTS: Ventura Bruns, PA-C   ANESTHESIA:   general  EBL:  Total I/O In: 1000 [I.V.:1000] Out: 400 [Urine:100; Blood:300]  BLOOD ADMINISTERED:none  DRAINS: none   LOCAL MEDICATIONS USED:  NONE  SPECIMEN:  No Specimen  DISPOSITION OF SPECIMEN:  N/A  COUNTS:  YES  TOURNIQUET:  * No tourniquets in log *  DICTATION: .Other Dictation: Dictation Number [id number]  PLAN OF CARE: Admit to inpatient   PATIENT DISPOSITION:  PACU - hemodynamically stable.   Delay start of Pharmacological VTE agent (>24hrs) due to surgical blood loss or risk of bleeding: no

## 2014-12-16 NOTE — Clinical Social Work Note (Signed)
CSW met with patient who is agreeable to SNF search- CSW discussed possible options pending [employer] authorization- will proceed with SNF search and auth and advise- CSW also spoke with Dr. Veverly Fells about plans- full assessment to follow.  Eduard Clos, MSW, [place]

## 2014-12-16 NOTE — Transfer of Care (Signed)
Immediate Anesthesia Transfer of Care Note  Patient: [name]  Procedure(s) Performed: Procedure(s): INTRAMEDULLARY (IM) NAIL INTERTROCHANTRIC (Left)  Patient Location: PACU  Anesthesia Type:General  Level of Consciousness: awake, alert , oriented and patient cooperative  Airway & Oxygen Therapy: Patient Spontanous Breathing and Patient connected to face mask oxygen  Post-op Assessment: Report given to PACU RN, Post -op Vital signs reviewed and stable and Patient moving all extremities  Post vital signs: Reviewed and stable  Complications: No apparent anesthesia complications

## 2014-12-17 ENCOUNTER — Encounter (HOSPITAL_COMMUNITY): Payer: Self-pay | Admitting: Orthopedic Surgery

## 2014-12-17 DIAGNOSIS — W19XXXA Unspecified fall, initial encounter: Secondary | ICD-10-CM | POA: Diagnosis present

## 2014-12-17 DIAGNOSIS — W19XXXD Unspecified fall, subsequent encounter: Secondary | ICD-10-CM

## 2014-12-17 LAB — CBC
HCT: 28.3 % — ABNORMAL LOW (ref 36.0–46.0)
Hemoglobin: 9 g/dL — ABNORMAL LOW (ref 12.0–15.0)
MCH: 28.5 pg (ref 26.0–34.0)
MCHC: 31.8 g/dL (ref 30.0–36.0)
MCV: 89.6 fL (ref 78.0–100.0)
PLATELETS: 211 10*3/uL (ref 150–400)
RBC: 3.16 MIL/uL — AB (ref 3.87–5.11)
RDW: 13.6 % (ref 11.5–15.5)
WBC: 10.2 10*3/uL (ref 4.0–10.5)

## 2014-12-17 LAB — BASIC METABOLIC PANEL
ANION GAP: 3 — AB (ref 5–15)
BUN: 16 mg/dL (ref 6–23)
CO2: 26 mmol/L (ref 19–32)
CREATININE: 0.74 mg/dL (ref 0.50–1.10)
Calcium: 8.1 mg/dL — ABNORMAL LOW (ref 8.4–10.5)
Chloride: 104 mEq/L (ref 96–112)
GFR calc Af Amer: >90 mL/min (ref >90)
GFR, EST NON AFRICAN AMERICAN: 89 mL/min — AB (ref >90)
Glucose, Bld: 157 mg/dL — ABNORMAL HIGH (ref 70–99)
Potassium: 4 mmol/L (ref 3.5–5.1)
SODIUM: 133 mmol/L — AB (ref 135–145)

## 2014-12-17 MED ORDER — SODIUM CHLORIDE 0.9 % IV SOLN
500.0000 mg | Freq: Three times a day (TID) | INTRAVENOUS | Status: DC
  Administered 2014-12-17 – 2014-12-18 (×3): 500 mg via INTRAVENOUS
  Filled 2014-12-17 (×4): qty 500

## 2014-12-17 MED ORDER — LACTATED RINGERS IV SOLN: INTRAVENOUS | Status: DC

## 2014-12-17 NOTE — Clinical Social Work Note (Signed)
CSW rec'd word from [employer] that patient's insurance ([employer]) does not have SNF benefits. Patient and husband aware and hopeful for CIR- I have also spoken with CIR rep to discuss and their consult is pending. Patient and husband are exploring options for SNF coverage if unable to go to CIR including a LTC policy and/or private pay.  CSW covering tomorrow to follow up for further assistance with dc disposition and advise.  Eduard Clos, MSW, [place]

## 2014-12-17 NOTE — Progress Notes (Signed)
[facility] PROGRESS NOTE  [name] YSA:[id number] DOB: [birth year] DOA: 12/15/2014 PCP: Garnet Koyanagi, DO   HPI/Subjective: Feels better, denies any complaints.  Assessment/Plan: Left comminuted displaced peritrochanteric femur fracture Secondary to mechanical fall. Status post ORIF with IM nail per orthopedics Dr. Veverly Fells to 12/16/2014. PT/OT.  Pain is controlled, Lovenox for DVT prophylaxis. PT/OT recommended short-term acute rehabilitation, either SNF or CIR.  Leukocytosis Likely secondary to UTI. Check urine cultures. Place on IV Rocephin.  Urinary tract infection Check a urine cultures. Place on IV Rocephin.  Hypertension Continue Avapro.  Hypothyroidism Continue Synthroid.  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 Outpatient follow-up.  Prophylaxis Lovenox for DVT prophylaxis.  Code Status: Full Family Communication: Updated patient no family at bedside. Disposition Plan: Likely need skilled nursing facility on discharge.   Consultants:  Orthopedics: Dr. Veverly Fells 12/15/2014  Procedures:  ORIF with Biomet intramedullary nail left hip 12/16/2014 per Dr. Veverly Fells  Chest x-ray 12/15/2014  X-ray of the left hip 12/15/2014  X-ray of the right shoulder 12/15/2014  Antibiotics:  IV Rocephin 12/16/2014    Objective: Filed Vitals:   12/17/14 0841  BP: 105/52  Pulse: 97  Temp:   Resp:     Intake/Output Summary (Last 24 hours) at 12/17/14 1506 Last data filed at 12/17/14 1410  Gross per 24 hour  Intake 1973.75 ml  Output    600 ml  Net 1373.75 ml   Filed Weights   12/16/14 0206  Weight: 87.544 kg (193 lb)    Exam:   General:  NAD  Cardiovascular: RRR  Respiratory: CTA B anterior lung fields.  Abdomen: Soft, nontender, nondistended, positive bowel sounds.  Musculoskeletal: No clubbing cyanosis or edema  Data Reviewed: Basic Metabolic Panel:  Recent Labs Lab  12/15/14 1818 12/16/14 0504 12/17/14 0500  NA 136* 136 133*  K 4.0 4.5 4.0  CL 97 104 104  CO2 21 24 26   GLUCOSE 168* 181* 157*  BUN 16 19 16   CREATININE 0.88 0.80 0.74  CALCIUM 10.1 8.6 8.1*   Liver Function Tests:  Recent Labs Lab 12/15/14 1818  AST 31  ALT 35  ALKPHOS 54  BILITOT 0.4  PROT 7.6  ALBUMIN 4.2   No results for input(s): LIPASE, AMYLASE in the last 168 hours. No results for input(s): AMMONIA in the last 168 hours. CBC:  Recent Labs Lab 12/15/14 1818 12/16/14 0504 12/17/14 0500  WBC 13.9* 16.5* 10.2  NEUTROABS 9.7*  --   --   HGB 13.4 11.1* 9.0*  HCT 40.5 35.0* 28.3*  MCV 87.7 89.3 89.6  PLT 313 302 211   Cardiac Enzymes: No results for input(s): CKTOTAL, CKMB, CKMBINDEX, TROPONINI in the last 168 hours. BNP (last 3 results) No results for input(s): PROBNP in the last 8760 hours. CBG: No results for input(s): GLUCAP in the last 168 hours.  No results found for this or any previous visit (from the past 240 hour(s)).   Studies: Dg Chest 1 View  12/15/2014   CLINICAL DATA:  Preoperative hip surgery.  Hypertension  EXAM: CHEST - 1 VIEW  COMPARISON:  None.  FINDINGS: Lungs are clear. Heart size and pulmonary vascularity are normal. No adenopathy.  There is a sclerotic lesion in the proximal right humerus, incompletely visualized.  IMPRESSION: No edema or consolidation. Sclerotic lesion proximal right humerus, incompletely visualized. This lesion most likely represents either a bone infarct or enchondroma. Right humerus radiographs to better characterize this lesion advised.  Electronically Signed   By: Lowella Grip M.D.   On: 12/15/2014 20:01   Dg Hip Complete Left  12/15/2014   CLINICAL DATA:  Fall in parking lot. Left hip pain. Initial encounter.  EXAM: LEFT HIP - COMPLETE 2+ VIEW  COMPARISON:  None.  FINDINGS: The bones appear adequately mineralized. There is an acute comminuted and angulated intertrochanteric left femur fracture. The femoral  head appears intact. There is no dislocation or evidence of pelvic fracture. There are mild underlying degenerative changes of both hips and sacroiliac joints.  IMPRESSION: Acute, comminuted and angulated intertrochanteric left femur fracture.   Electronically Signed   By: Camie Patience M.D.   On: 12/15/2014 19:19   Dg Hip Operative Left  12/16/2014   CLINICAL DATA:  Internal fixation of left femoral intertrochanteric fracture. Intraoperative radiographs. Subsequent encounter.  EXAM: OPERATIVE LEFT HIP  COMPARISON:  Left hip radiographs performed earlier today at 6:46 p.m.  FINDINGS: Five fluoroscopic C-arm images demonstrate successful placement of an intramedullary rod and screws across the patient's comminuted intertrochanteric fracture of the proximal left femur. A displaced lesser trochanteric fragment is again seen. The fracture is noted in near anatomic alignment. No new fractures are identified. The left femoral head remains seated at the acetabulum.  IMPRESSION: Successful internal fixation of left femoral comminuted intertrochanteric fracture in near anatomic alignment.   Electronically Signed   By: Garald Balding M.D.   On: 12/16/2014 00:50   Dg Shoulder Right Port  12/15/2014   CLINICAL DATA:  Right humeral lesion on prior exam, no trauma or shoulder symptoms  EXAM: PORTABLE RIGHT SHOULDER - 2+ VIEW  COMPARISON:  Chest radiograph 12/15/2014 is reviewed. There is no other prior imaging at this institution for comparison.  FINDINGS: Sclerotic lesion within the proximal right humerus is reidentified. No fracture or dislocation. Right lung apex is clear.  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 No acute abnormality. Unless the patient has right upper extremity symptoms, no further specific imaging followup is likely required for this probably benign finding.   Electronically Signed   By: Conchita Paris M.D.   On: 12/15/2014 21:08    Scheduled Meds: . cefTRIAXone  (ROCEPHIN)  IV  1 g Intravenous Q24H  . cholecalciferol  1,000 Units Oral Once per day on Mon Wed Fri  . enoxaparin (LOVENOX) injection  40 mg Subcutaneous Q24H  . feeding supplement (RESOURCE BREEZE)  1 Container Oral BID BM  . fluticasone  2 spray Each Nare Daily  . irbesartan  300 mg Oral Daily  . levothyroxine  88 mcg Oral QAC breakfast  . Vitamin D (Ergocalciferol)  50,000 Units Oral Once per day on Mon Wed Fri   Continuous Infusions: . sodium chloride 1,000 mL (12/17/14 0409)    Principal Problem:   Closed left hip fracture Active Problems:   HTN (hypertension)   Hypothyroidism   Lesion of right humerus   Subtrochanteric fracture of femur   UTI (urinary tract infection)   Leukocytosis   Hip fracture requiring operative repair    Time spent: 35 minutes    Toluwanimi Radebaugh A M.D. [facility] Pager [phone]. If 7PM-7AM, please contact night-coverage at [email or web address], password [facility] 12/17/2014, 3:06 PM  LOS: 2 days

## 2014-12-17 NOTE — Progress Notes (Signed)
Rehab Admissions Coordinator Note:  Patient was screened by [name] for appropriateness for an Inpatient Acute Rehab Consult per PT and OT recommendation.  At this time, we are recommending Inpatient Rehab consult. The limiting factor will be bed availability over the next 24 hrs and [employer] approval. Please place order for rehab consult if you would like to pursue.   [name] 12/17/2014, 1:35 PM  I can be reached at [phone].

## 2014-12-17 NOTE — Clinical Social Work Psychosocial (Addendum)
Clinical Social Work Department BRIEF PSYCHOSOCIAL ASSESSMENT 12/17/2014  Patient:  [name], [name]     Account Number:  [id number]     Admit date:  12/15/2014  Clinical Social Worker:  Daiva Huge  Date/Time:  12/16/2014 02:00 PM  Referred by:  Physician  Date Referred:  12/16/2014 Referred for  SNF Placement   Other Referral:   Interview type:  Patient Other interview type:    PSYCHOSOCIAL DATA Living Status:  FAMILY Admitted from facility:   Level of care:   Primary support name:  husband Primary support relationship to patient:  FAMILY Degree of support available:   good    CURRENT CONCERNS Current Concerns  Post-Acute Placement   Other Concerns:    SOCIAL WORK ASSESSMENT / PLAN Patient reports she was leaving the hospital to go home after visiting her husband who was hospitalized with ITP. She slipped on te wet ground in the hospital parking lot and sustained a leg fx. CSW discussed possible need for SNF at dc-she is agreeable to this option if needed and feels it will be benefical. We have discussed her insurance which will require auth and contracted SNF-   Assessment/plan status:  Other - See comment Other assessment/ plan:   FL2 and PASARR for SNF search   Information/referral to community resources:   SNF list    PATIENT'S/FAMILY'S RESPONSE TO PLAN OF CARE: Patient agreeable to CSW pursuing SNF options in anticipation of the need for this prior to returning home.       Eduard Clos, MSW, [place]

## 2014-12-17 NOTE — Progress Notes (Signed)
PHYSICAL THERAPY  Pt interested in pursuing CIR vs SNF.  After reviewing chart and discussing case with D/C planner.  Pt would be a good canidate for CIR.  Will see pt today and update LPT.  Rica Koyanagi  PTA [facility]  Acute  Rehab Pager      [phone]

## 2014-12-17 NOTE — Progress Notes (Signed)
Physical Therapy Treatment Patient Details Name: [name] MRN: [id number] DOB: Jan 01, 1951 Today's Date: 12/17/2014    History of Present Illness Pt fell in the parking lot of the hospital after visiting her husband resulting in L displaced intertrochanteric fx. S/P IM nail with strict non weightbearing precautions on the LLE.    PT Comments    POD # 2 IM Nail s/p mechanical fall.  Performed L LE TE's while supine in bed then assisted from bed to recliner via lateral scooting as pt was unable to attempt sit to stand with NWB.  Pt would be an good canidate for In Pt Rehab and has a strong D/C plan and family support.  Follow Up Recommendations  CIR     Equipment Recommendations       Recommendations for Other Services       Precautions / Restrictions Precautions Precautions: Fall Restrictions Weight Bearing Restrictions: Yes LUE Weight Bearing: Non weight bearing    Mobility  Bed Mobility Overal bed mobility: +2 for physical assistance;Needs Assistance Bed Mobility: Supine to Sit     Supine to sit: +2 for physical assistance;Max assist     General bed mobility comments: assist to advance BLEs and to raise trunk, pt 25%, limited by pain.  Much use of bed pad to scoot hips to get pt to EOB.  Transfers Overall transfer level: Needs assistance Equipment used: None Transfers: Lateral/Scoot Transfers          Lateral/Scoot Transfers: Max assist;+2 physical assistance;From elevated surface General transfer comment: increased time and much effort to perform lateral scoot from elevated bed to drop arm recliner.  Ambulation/Gait                 Stairs            Wheelchair Mobility    Modified Rankin (Stroke Patients Only)       Balance                                    Cognition                            Exercises   L Hip  TE's 10 reps ankle pumps 10 reps knee presses 10 reps heel slides 10 reps SAQ's 10  reps ABD Followed by ICE     General Comments        Pertinent Vitals/Pain Pain Assessment: 0-10 Pain Score: 6  Pain Location: L hip Pain Descriptors / Indicators: Constant;Sore Pain Intervention(s): Monitored during session;Premedicated before session;Repositioned;Ice applied    Home Living                      Prior Function            PT Goals (current goals can now be found in the care plan section) Progress towards PT goals: Progressing toward goals    Frequency  Min 4X/week    PT Plan      Co-evaluation             End of Session Equipment Utilized During Treatment: Gait belt Activity Tolerance: Patient limited by pain;Patient limited by fatigue Patient left: in chair;with call bell/phone within reach     Time: 1022-1050 PT Time Calculation (min) (ACUTE ONLY): 28 min  Charges:  $Therapeutic Exercise: 8-22 mins $Therapeutic Activity: 8-22 mins  G Codes:      Rica Koyanagi  PTA [facility]  Acute  Rehab Pager      [phone]

## 2014-12-17 NOTE — Progress Notes (Signed)
   Subjective: 2 Days Post-Op Procedure(s) (LRB): INTRAMEDULLARY (IM) NAIL INTERTROCHANTRIC (Left)  Pt resting comfortably in no acute distress Mild to moderate pain with movement otherwise doing okay Patient reports pain as moderate.  Objective:   VITALS:   Filed Vitals:   12/17/14 0602  BP: 101/47  Pulse: 106  Temp: 99.2 F (37.3 C)  Resp: 14    Left hip incision healing well No drainage or erythema nv intact distally Minimal edema distally bilaterally  LABS  Recent Labs  12/15/14 1818 12/16/14 0504 12/17/14 0500  HGB 13.4 11.1* 9.0*  HCT 40.5 35.0* 28.3*  WBC 13.9* 16.5* 10.2  PLT 313 302 211     Recent Labs  12/15/14 1818 12/16/14 0504 12/17/14 0500  NA 136* 136 133*  K 4.0 4.5 4.0  BUN 16 19 16   CREATININE 0.88 0.80 0.74  GLUCOSE 168* 181* 157*     Assessment/Plan: 2 Days Post-Op Procedure(s) (LRB): INTRAMEDULLARY (IM) NAIL INTERTROCHANTRIC (Left) Acute blood loss anemia - currently stable and asymptomatic but will monitor PT/OT as able Pt will need rehab placement at time of d/c  Will be ready for d/c as early as tomorrow if bed available    Merla Riches, MPAS, PA-C  12/17/2014, 7:25 AM

## 2014-12-17 NOTE — Progress Notes (Addendum)
Occupational Therapy Treatment Patient Details Name: [name] MRN: [id number] DOB: [birth year] Today's Date: 12/17/2014    History of present illness Pt fell in the parking lot of the hospital after visiting her husband resulting in L displaced intertrochanteric fx. S/P IM nail with strict non weightbearing precautions on the LLE.   OT comments  Pt progressing with ability to participate in ADLs.  Pt is still very limited with functional mobility and her ability to move sit to stand. Pt instructed in use of AE, and began UE strengthening this date.  Pt is very motivated and spouse is supportive.  Feel she would benefit from CIR to allow her to return home with spouse.  She was independent and working PTA.   Follow Up Recommendations  CIR    Equipment Recommendations  3 in 1 bedside comode;Tub/shower seat    Recommendations for Other Services Rehab consult    Precautions / Restrictions Precautions Precautions: Fall Restrictions Weight Bearing Restrictions: Yes LUE Weight Bearing: Non weight bearing       Mobility Bed Mobility Overal bed mobility: +2 for physical assistance;Needs Assistance Bed Mobility: Supine to Sit     Supine to sit: +2 for physical assistance;Max assist     General bed mobility comments: assist to advance BLEs and to raise trunk, pt 25%, limited by pain.  Much use of bed pad to scoot hips to get pt to EOB.  Transfers Overall transfer level: Needs assistance Equipment used: None Transfers: Lateral/Scoot Transfers          Lateral/Scoot Transfers: Max assist;+2 physical assistance;From elevated surface General transfer comment: increased time and much effort to perform lateral scoot from elevated bed to drop arm recliner.    Balance                                   ADL Overall ADL's : Needs assistance/impaired Eating/Feeding: Set up;Sitting   Grooming: Wash/dry hands;Wash/dry face;Oral care;Brushing hair;Set up;Sitting    Upper Body Bathing: Minimal assitance;Sitting   Lower Body Bathing: Total assistance;Sitting/lateral leans   Upper Body Dressing : Minimal assistance;Sitting   Lower Body Dressing: Total assistance;Sitting/lateral leans Lower Body Dressing Details (indicate cue type and reason): Pt instructed in use of AE for LB ADLs.  She is able do don/doff socks with min A.  She is unable to safely stand with +1 assist at this time.  Toilet Transfer: Total assistance Toilet Transfer Details (indicate cue type and reason): unable to safely perform with +1 assist Toileting- Clothing Manipulation and Hygiene: Total assistance;Sitting/lateral lean   Tub/ Shower Transfer: Total assistance Tub/Shower Transfer Details (indicate cue type and reason): unable Functional mobility during ADLs: Maximal assistance;+2 for physical assistance (for scoot transfers) General ADL Comments: Pt fatigues quickly       Vision                     Perception     Praxis      Cognition   Behavior During Therapy: [facility] for tasks assessed/performed Overall Cognitive Status: Within Functional Limits for tasks assessed                       Extremity/Trunk Assessment  Upper Extremity Assessment Upper Extremity Assessment: Generalized weakness   Lower Extremity Assessment Lower Extremity Assessment: Defer to PT evaluation   Cervical / Trunk Assessment Cervical / Trunk Assessment: Normal    Exercises Other  Exercises Other Exercises: Pt performed 15 reps chair pushups with one rest break and assist to hold Lt. UE in NWB   Shoulder Instructions       General Comments      Pertinent Vitals/ Pain       Pain Assessment: 0-10 Pain Score: 6  Pain Location: L hip Pain Descriptors / Indicators: Constant;Sore Pain Intervention(s): Monitored during session;Premedicated before session;Repositioned;Ice applied  Home Living Family/patient expects to be discharged to:: Inpatient rehab Living  Arrangements: Spouse/significant other                               Additional Comments: Pt is hoping for admission to CIR      Prior Functioning/Environment Level of Independence: Independent        Comments: has a cane, but was not using prior   Frequency Min 2X/week     Progress Toward Goals  OT Goals(current goals can now be found in the care plan section)     Acute Rehab OT Goals OT Goal Formulation: With patient Time For Goal Achievement: 12/30/14 Potential to Achieve Goals: Good ADL Goals Pt Will Perform Grooming: with supervision;sitting Pt Will Perform Upper Body Bathing: with supervision;sitting Pt Will Perform Upper Body Dressing: with supervision;sitting Pt Will Transfer to Toilet: with +2 assist;with min assist;stand pivot transfer;bedside commode Additional ADL Goal #1: Pt will perform bed mobility with moderate assist in preparation for ADL at EOB.  Plan Discharge plan needs to be updated    Co-evaluation                 End of Session     Activity Tolerance Patient limited by fatigue   Patient Left in chair;with call bell/phone within reach   Nurse Communication Mobility status        Time:  -     Charges: OT General Charges $OT Visit: 1 Procedure OT Treatments $Self Care/Home Management : 8-22 mins $Therapeutic Exercise: 8-22 mins  Cade Olberding M 12/17/2014, 1:11 PM

## 2014-12-17 NOTE — Clinical Social Work Note (Signed)
CSW working on SNF option for possible placement options. As well, a CIR consult has been made per husband's request and MD order. CSW continues to work closely with patient and husband to cooridinate an appropriate disposition for her- Her [employer] will play into the options and require auth for SNF/CIR transfer. CSW will update as their is progress on options.    Eduard Clos, MSW, [place]

## 2014-12-17 NOTE — Care Management Note (Signed)
    Page 1 of 1   12/17/2014     11:28:15 AM CARE MANAGEMENT NOTE 12/17/2014  Patient:  [name], [name]   Account Number:  [id number]  Date Initiated:  12/17/2014  Documentation initiated by:  [facility]  Subjective/Objective Assessment:   adm: INTRAMEDULLARY (IM) NAIL INTERTROCHANTRIC (Left)     Action/Plan:   SNF   Anticipated DC Date:  12/18/2014   Anticipated DC Plan:           Choice offered to / List presented to:             Status of service:  Completed, signed off Medicare Important Message given?   (If response is "NO", the following Medicare IM given date fields will be blank) Date Medicare IM given:   Medicare IM given by:   Date Additional Medicare IM given:   Additional Medicare IM given by:    Discharge Disposition:  [place]  Per UR Regulation:    If discussed at Long Length of Stay Meetings, dates discussed:    Comments:  12/17/14 08:00 Cm notes pt to go to SNF for [facility]; CSW arranging.  No other Cm needs were communicated.  Mariane Masters, BSN, CM [phone].

## 2014-12-17 NOTE — Consult Note (Signed)
Physical Medicine and Rehabilitation Consult Reason for Consult: Left comminuted displaced peritrochanteric femur fracture Referring Physician: [facility]   HPI: [name] is a 63 y.o. right handed female admitted 12/15/2014 after a mechanical fall in the parking lot without loss of consciousness. She lives with her spouse and was independent prior to admission. Patient with immediate left hip pain. X-rays and imaging revealed a left comminuted displaced peritrochanteric femur fracture. Underwent ORIF with [employer] intramedullary nail 12/16/2014 per Dr. Veverly Fells. Nonweightbearing left lower extremity. Hospital course pain management. Subcutaneous Lovenox for DVT prophylaxis. Urine study ESBL UTI placed on Primaxin. Acute blood loss anemia 9.0. Physical therapy evaluation completed 12/17/2014 with recommendations of physical medicine rehabilitation consult.   Review of Systems  Gastrointestinal: Positive for constipation.  Musculoskeletal: Positive for myalgias.  All other systems reviewed and are negative.  Past Medical History  Diagnosis Date  . 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 Chickenpox   . Genital warts   . Environmental allergies   . Heart murmur   . HTN (hypertension)   . Hyperlipidemia   . Thyroid disease     Hypothyroid  . Thyroid nodule    Past Surgical History  Procedure Laterality Date  . Carpal tunnel release      Both wrist  . Cystectomy      From the cervix  . Intramedullary (im) nail intertrochanteric Left 12/15/2014    Procedure: INTRAMEDULLARY (IM) NAIL INTERTROCHANTRIC;  Surgeon: Augustin Schooling, MD;  Location: [facility] ORS;  Service: Orthopedics;  Laterality: Left;   Family History  Problem Relation Age of Onset  . Arthritis Mother   . Hyperlipidemia Mother   . Heart disease Father   . Stroke Father     x's 2  . High blood pressure Father   . High blood pressure Mother   . Diabetes Mother   . Myasthenia gravis Mother    Social  History:  reports that she has never smoked. She has never used smokeless tobacco. She reports that she drinks alcohol. She reports that she does not use illicit drugs. Allergies:  Allergies  Allergen Reactions  . Statins Diarrhea  . Eggs Or Egg-Derived Products Diarrhea and Rash   Medications Prior to Admission  Medication Sig Dispense Refill  . azelastine (ASTELIN) 0.1 % nasal spray Place 1 spray into both nostrils 2 (two) times daily. Use in each nostril as directed 90 mL 3  . fluticasone (FLONASE) 50 MCG/ACT nasal spray Place 2 sprays into both nostrils daily. 48 g 3  . ibuprofen (ADVIL,MOTRIN) 200 MG tablet Take 200-800 mg by mouth every 6 (six) hours as needed for moderate pain.    Marland Kitchen levothyroxine (SYNTHROID) 88 MCG tablet Take 1 tablet (88 mcg total) by mouth daily. 90 tablet 3  . valsartan (DIOVAN) 320 MG tablet TAKE 1 TABLET DAILY 90 tablet 3  . Vitamin D, Ergocalciferol, (DRISDOL) 50000 UNITS CAPS capsule Take 50,000 Units by mouth 3 (three) times a week.    . Cholecalciferol (VITAMIN D3 PO) Take 1 capsule by mouth 3 (three) times a week.      Home: Home Living Family/patient expects to be discharged to:: Inpatient rehab Living Arrangements: Spouse/significant other Additional Comments: Pt is hoping for admission to CIR  Functional History: Prior Function Level of Independence: Independent Comments: has a cane, but was not using prior Functional Status:  Mobility: Bed Mobility Overal bed mobility: +2 for physical assistance, Needs Assistance Bed Mobility: Supine to  Sit Supine to sit: +2 for physical assistance, Max assist Sit to supine: +2 for physical assistance, Total assist General bed mobility comments: assist to advance BLEs and to raise trunk, pt 25%, limited by pain.  Much use of bed pad to scoot hips to get pt to EOB. Transfers Overall transfer level: Needs assistance Equipment used: None Transfers: Lateral/Scoot Transfers  Lateral/Scoot Transfers: Max assist,  +2 physical assistance, From elevated surface General transfer comment: increased time and much effort to perform lateral scoot from elevated bed to drop arm recliner.      ADL: ADL Overall ADL's : Needs assistance/impaired Eating/Feeding: Set up, Sitting Grooming: Wash/dry hands, Wash/dry face, Oral care, Brushing hair, Set up, Sitting Upper Body Bathing: Minimal assitance, Sitting Lower Body Bathing: Total assistance, Sitting/lateral leans Upper Body Dressing : Minimal assistance, Sitting Lower Body Dressing: Total assistance, Sitting/lateral leans Lower Body Dressing Details (indicate cue type and reason): Pt instructed in use of AE for LB ADLs.  She is able do don/doff socks with min A.  She is unable to safely stand with +1 assist at this time.  Toilet Transfer: Total assistance Toilet Transfer Details (indicate cue type and reason): unable to safely perform with +1 assist Toileting- Clothing Manipulation and Hygiene: Total assistance, Sitting/lateral lean Tub/ Shower Transfer: Total assistance Tub/Shower Transfer Details (indicate cue type and reason): unable Functional mobility during ADLs: Maximal assistance, +2 for physical assistance (for scoot transfers) General ADL Comments: Pt fatigues quickly   Cognition: Cognition Overall Cognitive Status: Within Functional Limits for tasks assessed Orientation Level: Oriented X4 Cognition Arousal/Alertness: Awake/alert Behavior During Therapy: WFL for tasks assessed/performed Overall Cognitive Status: Within Functional Limits for tasks assessed  Blood pressure 101/47, pulse 106, temperature 99.2 F (37.3 C), temperature source Oral, resp. rate 14, height 5\' 5"  (1.651 m), weight 87.544 kg (193 lb), SpO2 92 %. Physical Exam  Constitutional: She is oriented to person, place, and time. She appears well-developed.  HENT:  Head: Normocephalic.  Eyes: EOM are normal.  Neck: Normal range of motion. Neck supple. No thyromegaly present.    Cardiovascular: Normal rate and regular rhythm.   Respiratory: Effort normal and breath sounds normal. No respiratory distress.  GI: Soft. Bowel sounds are normal. She exhibits no distension.  Neurological: She is alert and oriented to person, place, and time.  Skin:  Left hip incision is dressed appropriately tender    Results for orders placed or performed during the hospital encounter of 12/15/14 (from the past 24 hour(s))  CBC     Status: Abnormal   Collection Time: 12/17/14  5:00 AM  Result Value Ref Range   WBC 10.2 4.0 - 10.5 K/uL   RBC 3.16 (L) 3.87 - 5.11 MIL/uL   Hemoglobin 9.0 (L) 12.0 - 15.0 g/dL   HCT 28.3 (L) 36.0 - 46.0 %   MCV 89.6 78.0 - 100.0 fL   MCH 28.5 26.0 - 34.0 pg   MCHC 31.8 30.0 - 36.0 g/dL   RDW 13.6 11.5 - 15.5 %   Platelets 211 150 - 400 K/uL  Basic metabolic panel     Status: Abnormal   Collection Time: 12/17/14  5:00 AM  Result Value Ref Range   Sodium 133 (L) 135 - 145 mmol/L   Potassium 4.0 3.5 - 5.1 mmol/L   Chloride 104 96 - 112 mEq/L   CO2 26 19 - 32 mmol/L   Glucose, Bld 157 (H) 70 - 99 mg/dL   BUN 16 6 - 23 mg/dL   Creatinine,  Ser 0.74 0.50 - 1.10 mg/dL   Calcium 8.1 (L) 8.4 - 10.5 mg/dL   GFR calc non Af Amer 89 (L) >90 mL/min   GFR calc Af Amer >90 >90 mL/min   Anion gap 3 (L) 5 - 15   Dg Chest 1 View  12/15/2014   CLINICAL DATA:  Preoperative hip surgery.  Hypertension  EXAM: CHEST - 1 VIEW  COMPARISON:  None.  FINDINGS: Lungs are clear. Heart size and pulmonary vascularity are normal. No adenopathy.  There is a sclerotic lesion in the proximal right humerus, incompletely visualized.  IMPRESSION: No edema or consolidation. Sclerotic lesion proximal right humerus, incompletely visualized. This lesion most likely represents either a bone infarct or enchondroma. Right humerus radiographs to better characterize this lesion advised.   Electronically Signed   By: Lowella Grip M.D.   On: 12/15/2014 20:01   Dg Hip Complete  Left  12/15/2014   CLINICAL DATA:  Fall in parking lot. Left hip pain. Initial encounter.  EXAM: LEFT HIP - COMPLETE 2+ VIEW  COMPARISON:  None.  FINDINGS: The bones appear adequately mineralized. There is an acute comminuted and angulated intertrochanteric left femur fracture. The femoral head appears intact. There is no dislocation or evidence of pelvic fracture. There are mild underlying degenerative changes of both hips and sacroiliac joints.  IMPRESSION: Acute, comminuted and angulated intertrochanteric left femur fracture.   Electronically Signed   By: Camie Patience M.D.   On: 12/15/2014 19:19   Dg Hip Operative Left  12/16/2014   CLINICAL DATA:  Internal fixation of left femoral intertrochanteric fracture. Intraoperative radiographs. Subsequent encounter.  EXAM: OPERATIVE LEFT HIP  COMPARISON:  Left hip radiographs performed earlier today at 6:46 p.m.  FINDINGS: Five fluoroscopic C-arm images demonstrate successful placement of an intramedullary rod and screws across the patient's comminuted intertrochanteric fracture of the proximal left femur. A displaced lesser trochanteric fragment is again seen. The fracture is noted in near anatomic alignment. No new fractures are identified. The left femoral head remains seated at the acetabulum.  IMPRESSION: Successful internal fixation of left femoral comminuted intertrochanteric fracture in near anatomic alignment.   Electronically Signed   By: Garald Balding M.D.   On: 12/16/2014 00:50   Dg Shoulder Right Port  12/15/2014   CLINICAL DATA:  Right humeral lesion on prior exam, no trauma or shoulder symptoms  EXAM: PORTABLE RIGHT SHOULDER - 2+ VIEW  COMPARISON:  Chest radiograph 12/15/2014 is reviewed. There is no other prior imaging at this institution for comparison.  FINDINGS: Sclerotic lesion within the proximal right humerus is reidentified. No fracture or dislocation. Right lung apex is clear.  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 No acute abnormality. Unless the patient has right upper extremity symptoms, no further specific imaging followup is likely required for this probably benign finding.   Electronically Signed   By: Conchita Paris M.D.   On: 12/15/2014 21:08    Assessment/Plan: Diagnosis: left comminuted displaced peritrochanteric femur fracture. 1. Does the need for close, 24 hr/day medical supervision in concert with the patient's rehab needs make it unreasonable for this patient to be served in a less intensive setting? Yes 2. Co-Morbidities requiring supervision/potential complications: HTN, S/P IM Nail POD#3 3. Due to bladder management, bowel management, safety, skin/wound care, disease management, medication administration, pain management and patient education, does the patient require 24 hr/day rehab nursing? Yes 4. Does the patient require coordinated care of a physician, rehab nurse, PT (1-2 hrs/day, 5  days/week) and OT (1-2 hrs/day, 5 days/week) to address physical and functional deficits in the context of the above medical diagnosis(es)? Yes Addressing deficits in the following areas: balance, endurance, locomotion, strength, transferring, bowel/bladder control, bathing, dressing, feeding, grooming and toileting 5. Can the patient actively participate in an intensive therapy program of at least 3 hrs of therapy per day at least 5 days per week? Yes 6. The potential for patient to make measurable gains while on inpatient rehab is good 7. Anticipated functional outcomes upon discharge from inpatient rehab are supervision  with PT, supervision with OT, n/a with SLP. 8. Estimated rehab length of stay to reach the above functional goals is: 7-10 days 9. Does the patient have adequate social supports and living environment to accommodate these discharge functional goals? Yes 10. Anticipated D/C setting: Home 11. Anticipated post D/C treatments: [place] therapy 12. Overall Rehab/Functional  Prognosis: excellent  RECOMMENDATIONS: This patient's condition is appropriate for continued rehabilitative care in the following setting: CIR Patient has agreed to participate in recommended program. Potentially Note that insurance prior authorization may be required for reimbursement for recommended care.  Comment:     12/17/2014

## 2014-12-17 NOTE — Progress Notes (Signed)
Physical Therapy Treatment Patient Details Name: [name] MRN: [id number] DOB: [birth year] Today's Date: 12/17/2014    History of Present Illness Pt fell in the parking lot of the hospital after visiting her husband resulting in L displaced intertrochanteric fx. S/P IM nail with strict non weightbearing precautions on the LLE.    PT Comments    Pt tolerated OOB in recliner for several hours.  Assisted from recliner to [facility] to void.  Assisted with hygiene then back to bed + 2 assist. Positioned to comfort and applied ICE.  Follow Up Recommendations  CIR     Equipment Recommendations       Recommendations for Other Services       Precautions / Restrictions Precautions Precautions: Fall Restrictions Weight Bearing Restrictions: Yes LUE Weight Bearing: Non weight bearing    Mobility  Bed Mobility Overal bed mobility: Needs Assistance Bed Mobility: Supine to Sit     Supine to sit: +2 for physical assistance;Max assist Sit to supine: +2 for physical assistance;Total assist   General bed mobility comments: asssited back to bed + 2 total assist pt 15%  Transfers Overall transfer level: Needs assistance Equipment used: Rolling walker (2 wheeled) Transfers: Sit to/from Stand Sit to Stand: +2 physical assistance;Max assist;Total assist        Lateral/Scoot Transfers: Max assist;+2 physical assistance;From elevated surface General transfer comment: 75% VC's on proper tech and hand placement.  Assisted from recliner to [facility] then back to bed. 1/4 pivoted turns towards pt R.   Ambulation/Gait             General Gait Details: transfers only so far due to difficulty maintaining NWB L LE.   Stairs            Wheelchair Mobility    Modified Rankin (Stroke Patients Only)       Balance                                    Cognition Arousal/Alertness: Awake/alert Behavior During Therapy: WFL for tasks assessed/performed Overall Cognitive  Status: Within Functional Limits for tasks assessed                      Exercises Other Exercises Other Exercises: Pt performed 15 reps chair pushups with one rest break and assist to hold Lt. UE in NWB    General Comments        Pertinent Vitals/Pain Pain Assessment: 0-10 Pain Score: 8  Pain Location: L hip Pain Descriptors / Indicators: Constant;Sore Pain Intervention(s): Monitored during session;RN gave pain meds during session;Ice applied;Repositioned    Home Living Family/patient expects to be discharged to:: Inpatient rehab Living Arrangements: Spouse/significant other             Additional Comments: Pt is hoping for admission to CIR    Prior Function Level of Independence: Independent      Comments: has a cane, but was not using prior   PT Goals (current goals can now be found in the care plan section) Progress towards PT goals: Progressing toward goals    Frequency  Min 4X/week    PT Plan      Co-evaluation             End of Session Equipment Utilized During Treatment: Gait belt Activity Tolerance: Patient limited by pain;Patient limited by fatigue Patient left: in bed;with call bell/phone within reach;with nursing/sitter in room  Time: 9470-9628 PT Time Calculation (min) (ACUTE ONLY): 17 min  Charges:  $Therapeutic Exercise: 8-22 mins $Therapeutic Activity: 8-22 mins                    G Codes:      Rica Koyanagi  PTA [facility]  Acute  Rehab Pager      [phone]

## 2014-12-17 NOTE — Clinical Social Work Placement (Signed)
Clinical Social Work Department CLINICAL SOCIAL WORK PLACEMENT NOTE 12/17/2014  Patient:  [name], [name]  Account Number:  [id number] Admit date:  12/15/2014  Clinical Social Worker:  Daiva Huge  Date/time:  12/16/2014 05:00 PM  Clinical Social Work is seeking post-discharge placement for this patient at the following level of care:   [place]   (*CSW will update this form in Epic as items are completed)   12/16/2014  Patient/family provided with [place] Department of Clinical Social Work's list of facilities offering this level of care within the geographic area requested by the patient (or if unable, by the patient's family).  12/16/2014  Patient/family informed of their freedom to choose among providers that offer the needed level of care, that participate in Medicare, Medicaid or managed care program needed by the patient, have an available bed and are willing to accept the patient.  12/16/2014  Patient/family informed of [facility]' ownership interest in [facility], as well as of the fact that they are under no obligation to receive care at this facility.  PASARR submitted to EDS on 12/16/2014 PASARR number received on 12/16/2014  FL2 transmitted to all facilities in geographic area requested by pt/family on  12/16/2014 FL2 transmitted to all facilities within larger geographic area on   Patient informed that his/her managed care company has contracts with or will negotiate with  certain facilities, including the following:     Patient/family informed of bed offers received:   Patient chooses bed at  Physician recommends and patient chooses bed at    Patient to be transferred to  on   Patient to be transferred to facility by  Patient and family notified of transfer on  Name of family member notified:    The following physician request were entered in Epic:   Additional Comments:   Eduard Clos, MSW, [place]

## 2014-12-18 ENCOUNTER — Inpatient Hospital Stay (HOSPITAL_COMMUNITY)
Admission: RE | Admit: 2014-12-18 | Discharge: 2014-12-31 | DRG: 560 | Disposition: A | Discharge status: Home Health | Payer: 59 | Source: Intra-hospital | Attending: Physical Medicine & Rehabilitation | Admitting: Physical Medicine & Rehabilitation

## 2014-12-18 DIAGNOSIS — D62 Acute posthemorrhagic anemia: Secondary | ICD-10-CM | POA: Diagnosis present

## 2014-12-18 DIAGNOSIS — S72102D Unspecified trochanteric fracture of left femur, subsequent encounter for closed fracture with routine healing: Principal | ICD-10-CM

## 2014-12-18 DIAGNOSIS — W19XXXS Unspecified fall, sequela: Secondary | ICD-10-CM

## 2014-12-18 DIAGNOSIS — I10 Essential (primary) hypertension: Secondary | ICD-10-CM | POA: Diagnosis present

## 2014-12-18 DIAGNOSIS — E039 Hypothyroidism, unspecified: Secondary | ICD-10-CM | POA: Diagnosis present

## 2014-12-18 DIAGNOSIS — S72142A Displaced intertrochanteric fracture of left femur, initial encounter for closed fracture: Secondary | ICD-10-CM | POA: Diagnosis present

## 2014-12-18 DIAGNOSIS — N390 Urinary tract infection, site not specified: Secondary | ICD-10-CM | POA: Diagnosis present

## 2014-12-18 LAB — CREATININE, SERUM
Creatinine, Ser: 0.78 mg/dL (ref 0.50–1.10)
GFR calc Af Amer: >90 mL/min (ref >90)
GFR calc non Af Amer: 88 mL/min — ABNORMAL LOW (ref >90)

## 2014-12-18 LAB — CBC
HCT: 27.9 % — ABNORMAL LOW (ref 36.0–46.0)
HEMATOCRIT: 29 % — AB (ref 36.0–46.0)
HEMOGLOBIN: 9 g/dL — AB (ref 12.0–15.0)
Hemoglobin: 9.4 g/dL — ABNORMAL LOW (ref 12.0–15.0)
MCH: 28.7 pg (ref 26.0–34.0)
MCH: 28 pg (ref 26.0–34.0)
MCHC: 32.4 g/dL (ref 30.0–36.0)
MCHC: 32.3 g/dL (ref 30.0–36.0)
MCV: 88.4 fL (ref 78.0–100.0)
MCV: 86.9 fL (ref 78.0–100.0)
Platelets: 238 10*3/uL (ref 150–400)
Platelets: 282 10*3/uL (ref 150–400)
RBC: 3.28 MIL/uL — ABNORMAL LOW (ref 3.87–5.11)
RBC: 3.21 MIL/uL — ABNORMAL LOW (ref 3.87–5.11)
RDW: 13.3 % (ref 11.5–15.5)
RDW: 13.5 % (ref 11.5–15.5)
WBC: 12.9 10*3/uL — AB (ref 4.0–10.5)
WBC: 13.6 10*3/uL — ABNORMAL HIGH (ref 4.0–10.5)

## 2014-12-18 LAB — BASIC METABOLIC PANEL
Anion gap: 4 — ABNORMAL LOW (ref 5–15)
BUN: 11 mg/dL (ref 6–23)
CO2: 26 mmol/L (ref 19–32)
Calcium: 8.4 mg/dL (ref 8.4–10.5)
Chloride: 106 mEq/L (ref 96–112)
Creatinine, Ser: 0.67 mg/dL (ref 0.50–1.10)
GFR calc Af Amer: >90 mL/min (ref >90)
GFR calc non Af Amer: >90 mL/min (ref >90)
GLUCOSE: 139 mg/dL — AB (ref 70–99)
Potassium: 4 mmol/L (ref 3.5–5.1)
SODIUM: 136 mmol/L (ref 135–145)

## 2014-12-18 LAB — URINE CULTURE

## 2014-12-18 MED ORDER — VITAMIN D3 25 MCG (1000 UNIT) PO TABS
1000.0000 [IU] | ORAL_TABLET | ORAL | Status: DC
  Administered 2014-12-19 – 2014-12-31 (×6): 1000 [IU] via ORAL
  Filled 2014-12-18 (×6): qty 1

## 2014-12-18 MED ORDER — VITAMIN D (ERGOCALCIFEROL) 1.25 MG (50000 UNIT) PO CAPS
50000.0000 [IU] | ORAL_CAPSULE | ORAL | Status: DC
  Administered 2014-12-19 – 2014-12-31 (×6): 50000 [IU] via ORAL
  Filled 2014-12-18 (×6): qty 1

## 2014-12-18 MED ORDER — HYDROCODONE-ACETAMINOPHEN 5-325 MG PO TABS
1.0000 | ORAL_TABLET | Freq: Four times a day (QID) | ORAL | Status: DC | PRN
  Administered 2014-12-18 – 2014-12-21 (×11): 2 via ORAL
  Administered 2014-12-22: 1 via ORAL
  Administered 2014-12-22 – 2014-12-24 (×5): 2 via ORAL
  Administered 2014-12-24 (×2): 1 via ORAL
  Administered 2014-12-25 – 2014-12-26 (×3): 2 via ORAL
  Administered 2014-12-27: 1 via ORAL
  Administered 2014-12-27 – 2014-12-28 (×4): 2 via ORAL
  Administered 2014-12-28: 1 via ORAL
  Administered 2014-12-28: 2 via ORAL
  Administered 2014-12-29: 1 via ORAL
  Administered 2014-12-29 – 2014-12-31 (×5): 2 via ORAL
  Filled 2014-12-18 (×5): qty 2
  Filled 2014-12-18: qty 1
  Filled 2014-12-18 (×4): qty 2
  Filled 2014-12-18: qty 1
  Filled 2014-12-18: qty 2
  Filled 2014-12-18: qty 1
  Filled 2014-12-18 (×12): qty 2
  Filled 2014-12-18: qty 1
  Filled 2014-12-18 (×9): qty 2

## 2014-12-18 MED ORDER — FLUTICASONE PROPIONATE 50 MCG/ACT NA SUSP
2.0000 | Freq: Every day | NASAL | Status: DC
Start: 2014-12-19 — End: 2014-12-31
  Administered 2014-12-19 – 2014-12-31 (×13): 2 via NASAL
  Filled 2014-12-18: qty 16

## 2014-12-18 MED ORDER — ACETAMINOPHEN 650 MG RE SUPP: 650.0000 mg | Freq: Four times a day (QID) | RECTAL | Status: DC | PRN

## 2014-12-18 MED ORDER — POLYETHYLENE GLYCOL 3350 17 G PO PACK
17.0000 g | PACK | Freq: Every day | ORAL | Status: DC | PRN
  Administered 2014-12-19 – 2014-12-20 (×2): 17 g via ORAL
  Filled 2014-12-18 (×3): qty 1

## 2014-12-18 MED ORDER — ONDANSETRON HCL 4 MG/2ML IJ SOLN: 4.0000 mg | Freq: Four times a day (QID) | INTRAMUSCULAR | Status: DC | PRN

## 2014-12-18 MED ORDER — ENOXAPARIN SODIUM 40 MG/0.4ML ~~LOC~~ SOLN: 40.0000 mg | SUBCUTANEOUS | Status: DC

## 2014-12-18 MED ORDER — BOOST / RESOURCE BREEZE PO LIQD
1.0000 | Freq: Two times a day (BID) | ORAL | Status: DC
  Administered 2014-12-18 – 2014-12-27 (×7): 1 via ORAL

## 2014-12-18 MED ORDER — LEVOTHYROXINE SODIUM 88 MCG PO TABS
88.0000 ug | ORAL_TABLET | Freq: Every day | ORAL | Status: DC
  Administered 2014-12-19 – 2014-12-31 (×13): 88 ug via ORAL
  Filled 2014-12-18 (×14): qty 1

## 2014-12-18 MED ORDER — METHOCARBAMOL 500 MG PO TABS
500.0000 mg | ORAL_TABLET | Freq: Four times a day (QID) | ORAL | Status: DC | PRN
  Administered 2014-12-21 – 2014-12-22 (×4): 500 mg via ORAL
  Filled 2014-12-18 (×5): qty 1

## 2014-12-18 MED ORDER — ACETAMINOPHEN 325 MG PO TABS
325.0000 mg | ORAL_TABLET | ORAL | Status: DC | PRN
  Administered 2014-12-20 – 2014-12-22 (×3): 650 mg via ORAL
  Filled 2014-12-18 (×3): qty 2

## 2014-12-18 MED ORDER — SULFAMETHOXAZOLE-TRIMETHOPRIM 800-160 MG PO TABS: 1.0000 | ORAL_TABLET | Freq: Two times a day (BID) | ORAL | Status: DC

## 2014-12-18 MED ORDER — FOSFOMYCIN TROMETHAMINE 3 G PO PACK
3.0000 g | PACK | Freq: Once | ORAL | Status: AC
  Administered 2014-12-18: 3 g via ORAL
  Filled 2014-12-18: qty 3

## 2014-12-18 MED ORDER — ENOXAPARIN SODIUM 40 MG/0.4ML ~~LOC~~ SOLN
40.0000 mg | SUBCUTANEOUS | Status: DC
  Administered 2014-12-19 – 2014-12-30 (×11): 40 mg via SUBCUTANEOUS
  Filled 2014-12-18 (×14): qty 0.4

## 2014-12-18 MED ORDER — HYDROCODONE-ACETAMINOPHEN 5-325 MG PO TABS: 1.0000 | ORAL_TABLET | Freq: Four times a day (QID) | ORAL | Status: DC | PRN

## 2014-12-18 MED ORDER — IRBESARTAN 300 MG PO TABS
300.0000 mg | ORAL_TABLET | Freq: Every day | ORAL | Status: DC
  Administered 2014-12-19 – 2014-12-31 (×13): 300 mg via ORAL
  Filled 2014-12-18 (×14): qty 1

## 2014-12-18 MED ORDER — ONDANSETRON HCL 4 MG PO TABS
4.0000 mg | ORAL_TABLET | Freq: Four times a day (QID) | ORAL | Status: DC | PRN
  Administered 2014-12-20: 4 mg via ORAL
  Filled 2014-12-18: qty 1

## 2014-12-18 MED ORDER — ACETAMINOPHEN 325 MG PO TABS: 650.0000 mg | ORAL_TABLET | Freq: Four times a day (QID) | ORAL | Status: DC | PRN

## 2014-12-18 NOTE — PMR Pre-admission (Signed)
PMR Admission Coordinator Pre-Admission Assessment  Patient: [name] is an 63 y.o., female MRN: [id number] DOB: Mar 03, 1951 Height: 5\' 5"  (165.1 cm) Weight: 87.544 kg (193 lb)              Insurance Information HMO:     PPO: yes     PCP:      IPA:      80/20:      OTHER:  PRIMARY: [employer]      Policy#: [id number]      Subscriber: pt CM Name: Lannette Donath      Phone#: [phone] Ext [phone]     Fax#: verbals Pre-Cert#: [id number] [place] for 5 days with update due 12/28 to [facility] phone [phone] ext [phone] fax [phone]      Employer:  Benefits:  Phone #: [phone]     Name: 12/18/14 Eff. Date: 1/13     Deduct: none      Out of Pocket Max: $5000      Life Max: none CIR: 90%      SNF: not covered without CM negotiation Outpatient: $275 deductible then 90%     Co-Pay: 60 visits combined Home Health: $275 deductible then 90%      Co-Pay: 25 visits combined DME: $275 deductible then covered 90%     Co-Pay: 10% Providers: in network  SECONDARY: none       Medicaid Application Date:       Case Manager:  Disability Application Date:       Case Worker:   Emergency Facilities manager Information    Name Relation Home Work Mobile   [place] Spouse [phone]       Current Medical History  Patient Admitting Diagnosis: left comminuted displaced peritrochanteric femur fracture  History of Present Illness: [name] is a 63 y.o. right handed female admitted 12/15/2014 after a mechanical fall in the parking lot without loss of consciousness. She lives with her spouse and was independent prior to admission. Patient with immediate left hip pain. X-rays and imaging revealed a left comminuted displaced peritrochanteric femur fracture. Underwent ORIF with Biomet intramedullary nail 12/16/2014 per Dr. Veverly Fells. Nonweightbearing left lower extremity. Hospital course pain management. Subcutaneous Lovenox for DVT prophylaxis. Urine study ESBL UTI placed on Primaxin. Acute blood loss  anemia 9.0.   Past Medical History  Past Medical History  Diagnosis Date  . 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 Chickenpox   . Genital warts   . Environmental allergies   . Heart murmur   . HTN (hypertension)   . Hyperlipidemia   . Thyroid disease     Hypothyroid  . Thyroid nodule     Family History  family history includes Arthritis in her mother; Diabetes in her mother; Heart disease in her father; High blood pressure in her father and mother; Hyperlipidemia in her mother; Myasthenia gravis in her mother; Stroke in her father.  Prior Rehab/Hospitalizations: none. Has had  Issues with osteoarthritis l knee and had cortisone injections in the past  Current Medications  Current facility-administered medications: acetaminophen (TYLENOL) tablet 650 mg, 650 mg, Oral, Q6H PRN **OR** acetaminophen (TYLENOL) suppository 650 mg, 650 mg, Rectal, Q6H PRN, Augustin Schooling, MD;  cholecalciferol (VITAMIN D) tablet 1,000 Units, 1,000 Units, Oral, Once per day on Mon Wed Fri, Steven R Norris, MD, 1,000 Units at 12/17/14 0842 enoxaparin (LOVENOX) injection 40 mg, 40 mg, Subcutaneous, Q24H, Jared M Gardner, DO, 40 mg at 12/17/14 1739;  feeding supplement (RESOURCE  BREEZE) (RESOURCE BREEZE) liquid 1 Container, 1 Container, Oral, BID BM, Clayton Bibles, RD, 1 Container at 12/18/14 0900;  fluticasone (FLONASE) 50 MCG/ACT nasal spray 2 spray, 2 spray, Each Nare, Daily, Etta Quill, DO, 2 spray at 12/18/14 0745 HYDROcodone-acetaminophen (NORCO/VICODIN) 5-325 MG per tablet 1-2 tablet, 1-2 tablet, Oral, Q6H PRN, Etta Quill, DO, 2 tablet at 12/18/14 1024;  ibuprofen (ADVIL,MOTRIN) tablet 200-800 mg, 200-800 mg, Oral, Q6H PRN, Augustin Schooling, MD, 800 mg at 12/17/14 2006;  irbesartan (AVAPRO) tablet 300 mg, 300 mg, Oral, Daily, Jared M Gardner, DO, 300 mg at 12/18/14 0745 levothyroxine (SYNTHROID, LEVOTHROID) tablet 88 mcg, 88 mcg, Oral, QAC breakfast, Etta Quill, DO, 88 mcg at 12/18/14  0745;  menthol-cetylpyridinium (CEPACOL) lozenge 3 mg, 1 lozenge, Oral, PRN **OR** phenol (CHLORASEPTIC) mouth spray 1 spray, 1 spray, Mouth/Throat, PRN, Augustin Schooling, MD methocarbamol (ROBAXIN) tablet 500 mg, 500 mg, Oral, Q6H PRN, 500 mg at 12/17/14 2133 **OR** methocarbamol (ROBAXIN) 500 mg in dextrose 5 % 50 mL IVPB, 500 mg, Intravenous, Q6H PRN, Augustin Schooling, MD, 500 mg at 12/16/14 0121;  metoCLOPramide (REGLAN) tablet 5-10 mg, 5-10 mg, Oral, Q8H PRN **OR** metoCLOPramide (REGLAN) injection 5-10 mg, 5-10 mg, Intravenous, Q8H PRN, Augustin Schooling, MD morphine 2 MG/ML injection 0.5 mg, 0.5 mg, Intravenous, Q2H PRN, Etta Quill, DO;  morphine 4 MG/ML injection 4 mg, 4 mg, Intravenous, Q30 min PRN, Charlesetta Shanks, MD, 4 mg at 12/15/14 1931;  ondansetron (ZOFRAN) tablet 4 mg, 4 mg, Oral, Q6H PRN **OR** ondansetron (ZOFRAN) injection 4 mg, 4 mg, Intravenous, Q6H PRN, Augustin Schooling, MD, 4 mg at 12/18/14 0744 polyethylene glycol (MIRALAX / GLYCOLAX) packet 17 g, 17 g, Oral, Daily PRN, Augustin Schooling, MD, 17 g at 12/17/14 2011;  Vitamin D (Ergocalciferol) (DRISDOL) capsule 50,000 Units, 50,000 Units, Oral, Once per day on Mon Wed Fri, Steven R Norris, MD, 50,000 Units at 12/17/14 5681  Patients Current Diet: Diet regular Diet - low sodium heart healthy  Precautions / Restrictions Precautions Precautions: Fall Restrictions Weight Bearing Restrictions: Yes LUE Weight Bearing: Non weight bearing   Prior Activity Level Community (5-7x/wk): 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  Spouse unemployed and can provide 24/7 supervision. He was recently hospitalized for ITP. Pt fell in [place] parking lot when visiting husband.  Home Assistive Devices / Equipment Home Assistive Devices/Equipment: Eyeglasses  Prior Functional Level Prior Function Level of Independence: Independent Comments: has a cane, but was not using prior  Current Functional Level Cognition  Overall Cognitive  Status: Within Functional Limits for tasks assessed Orientation Level: Oriented X4    Extremity Assessment (includes Sensation/Coordination)          ADLs  Overall ADL's : Needs assistance/impaired Eating/Feeding: Set up, Sitting Grooming: Wash/dry hands, Wash/dry face, Oral care, Brushing hair, Set up, Sitting Upper Body Bathing: Minimal assitance, Sitting Lower Body Bathing: Total assistance, Sitting/lateral leans Upper Body Dressing : Minimal assistance, Sitting Lower Body Dressing: Total assistance, Sitting/lateral leans Lower Body Dressing Details (indicate cue type and reason): Pt instructed in use of AE for LB ADLs.  She is able do don/doff socks with min A.  She is unable to safely stand with +1 assist at this time.  Toilet Transfer: Total assistance Toilet Transfer Details (indicate cue type and reason): unable to safely perform with +1 assist Toileting- Clothing Manipulation and Hygiene: Total assistance, Sitting/lateral lean Tub/ Shower Transfer: Total assistance Tub/Shower Transfer Details (indicate cue type and reason): unable Functional  mobility during ADLs: Maximal assistance, +2 for physical assistance (for scoot transfers) General ADL Comments: Pt fatigues quickly     Mobility  Overal bed mobility: Needs Assistance Bed Mobility: Supine to Sit Supine to sit: Max assist Sit to supine: +2 for physical assistance, Total assist General bed mobility comments: assisted supine to sit MAX assist with increased time and support L LE.  Difficulty scooting due to pain level.    Transfers  Overall transfer level: Needs assistance Equipment used: Rolling walker (2 wheeled) Transfers: Sit to/from Stand Sit to Stand: Max assist  Lateral/Scoot Transfers: Max assist, +2 physical assistance, From elevated surface General transfer comment: 25% VC's on proper hand placement and increased time.  Pt was more able to self rise  Assisted from bed to [facility] then to recliner.    Ambulation  / Gait / Stairs / Wheelchair Mobility  Ambulation/Gait General Gait Details: transfers only so far due to difficulty maintaining NWB L LE.    Posture / Balance Dynamic Sitting Balance Sitting balance - Comments: initally max assist 2* posterior lean, then progressed to supervision (only R foot supported, pt unable to tolerate lowering LLE to floor). Pt sat on EOB x 8 min.     Special needs/care consideration Bowel mgmt: no BM since OR Bladder mgmt: continent Pain limiting progression of mobility and difficulty with NWB status  CM from [employer] to assist with planning is Janifer Adie. Phone [phone] ext [phone]. I called and left her a message 12/24 to discuss SNF benefits if needed.   Previous Home Environment Living Arrangements: Spouse/significant other  Lives With: Spouse Available Help at Discharge: Family Type of Home: House Home Layout: One level Home Access: Stairs to enter Entrance Stairs-Rails: Left Entrance Stairs-Number of Steps: 3 steps Bathroom Shower/Tub: Chiropodist: Standard Bathroom Accessibility: Yes How Accessible: Accessible via walker Simms: No Additional Comments: Pt is hoping for admission to CIR  Discharge Living Setting Plans for Discharge Living Setting: Patient's home, Lives with (comment), Other (Comment) (spouse) Type of Home at Discharge: House Discharge Home Layout: One level Discharge Home Access: Stairs to enter Entrance Stairs-Rails: Left Entrance Stairs-Number of Steps: 3 steps Discharge Bathroom Shower/Tub: Tub/shower unit Discharge Bathroom Toilet: Standard Discharge Bathroom Accessibility: Yes How Accessible: Accessible via walker Does the patient have any problems obtaining your medications?: No  Social/Family/Support Systems Patient Roles: Spouse, Other (Comment) (employee) Contact Information: Alma Friendly, spouse Anticipated Caregiver: spouse Anticipated Caregiver's Contact Information: see  above Ability/Limitations of Caregiver: none. unemployed. recently hospitalized for ITP per wife Caregiver Availability: 24/7 Discharge Plan Discussed with Primary Caregiver: Yes Is Caregiver In Agreement with Plan?: Yes Does Caregiver/Family have Issues with Lodging/Transportation while Pt is in Rehab?: No  Goals/Additional Needs Patient/Family Goal for Rehab: Mod I with PT and OT Expected length of stay: ELOS 7 to 10 days Special Service Needs: Pt fell in parking lot when visiting her husband who was hospitalized at the time. Walking up enbankment with pine needles   Decrease burden of Care through IP rehab admission: n/a  Possible need for SNF placement upon discharge:not anticipated. [employer] quotes no SNF benefits without CM involvement for negotiation.  Patient Condition: This patient's condition remains as documented in the consult dated 12/18/2014 , in which the Rehabilitation Physician determined and documented that the patient's condition is appropriate for intensive rehabilitative care in an inpatient rehabilitation facility. Will admit to inpatient rehab today.  Preadmission Screen Completed By:  [name], 12/18/2014 2:25 PM ______________________________________________________________________  Discussed status with Dr.  Letta Pate on 12/18/2014 at  1425  and received telephone approval for admission today.  Admission Coordinator:  [name], time 1031 Date 12/18/2014.

## 2014-12-18 NOTE — Progress Notes (Signed)
I have begun precertification with [employer] for a possible admission to inpt rehab today pending their approval. I contacted P.T. For therapy updates this morning and home layout assessment to assist with this approval. I have discussed with SW.  [phone]

## 2014-12-18 NOTE — Discharge Summary (Signed)
Physician Discharge Summary  [name] [id number] DOB: July 17, 1951 DOA: 12/15/2014  PCP: Jerlyn Ly, MD  Admit date: 12/15/2014 Discharge date: 12/18/2014  Time spent: 40 minutes  Recommendations for Outpatient Follow-up:  1. Follow-up with primary care physician 1-2 weeks after discharge. 2. Call Dr. Veverly Fells office in 2 weeks to schedule follow-up. 3. Lovenox 40 mg subcutaneously for post hip surgery DVT prophylaxis for 3-4 weeks. 4. Bactrim DS twice a day for 3 more days.  Discharge Diagnoses:  Principal Problem:   Closed left hip fracture Active Problems:   HTN (hypertension)   Hypothyroidism   Lesion of right humerus   Subtrochanteric fracture of femur   UTI (urinary tract infection)   Leukocytosis   Hip fracture requiring operative repair   Fall   Discharge Condition: Stable  Diet recommendation: October the  Filed Weights   12/16/14 0206  Weight: 87.544 kg (193 lb)    History of present illness:  [name] is a 63 y.o. female who was leaving the hospital after bringing her husband in (husband is currently admitted for ITP). Suffered mechanical fall in the parking lot, had immediate LEFT hip pain, deformity, and inability to bear weight or stand after fall. Brought in to ED. Pain worse with movement, better at rest. Pain is severe. Found to have intertrochanteric LEFT hip fracture.  Hospital Course:   Left comminuted displaced peritrochanteric femur fracture Secondary to mechanical fall. Status post ORIF with IM nail per orthopedics Dr. Veverly Fells to 12/16/2014.  Pain is controlled, Lovenox for DVT prophylaxis. PT/OT recommended short-term acute rehabilitation, either SNF or CIR. Patient for CIR today.  Leukocytosis Likely secondary to UTI.   Urinary tract infection UA consistent with UTI, patient initially started on IV Rocephin and switched to Primaxin because of history of ESBL UTI. Urine culture showed ESBL Escherichia coli, 70,000  CFUs. Urine culture comes back with 70,000 CFUs, had one day of Primaxin, given 1 dose of fosfomycin and 3 more days of Bactrim on discharge.  Hypertension Continue Avapro.  Hypothyroidism Continue Synthroid.  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 Outpatient follow-up.   Procedures:  ORIF with Biomet intramedullary nail left hip 12/16/2014 per Dr. Veverly Fells  Consultations:  Dr. Veverly Fells of orthopedics.  Discharge Exam: Filed Vitals:   12/18/14 0511  BP: 136/53  Pulse: 100  Temp: 99.1 F (37.3 C)  Resp: 16   General: Alert and awake, oriented x3, not in any acute distress. HEENT: anicteric sclera, pupils reactive to light and accommodation, EOMI CVS: S1-S2 clear, no murmur rubs or gallops Chest: clear to auscultation bilaterally, no wheezing, rales or rhonchi Abdomen: soft nontender, nondistended, normal bowel sounds, no organomegaly Extremities: no cyanosis, clubbing or edema noted bilaterally Neuro: Cranial nerves II-XII intact, no focal neurological deficits  Discharge Instructions   Discharge Instructions    Diet - low sodium heart healthy    Complete by:  As directed      Increase activity slowly    Complete by:  As directed      Non weight bearing    Complete by:  As directed   Laterality:  left  Extremity:  Lower          Current Discharge Medication List    START taking these medications   Details  acetaminophen (TYLENOL) 325 MG tablet Take 2 tablets (650 mg total) by mouth every 6 (six) hours as needed. Qty: 60 tablet, Refills: 1    enoxaparin (LOVENOX) 40  MG/0.4ML injection Inject 0.4 mLs (40 mg total) into the skin daily. 30 days post op Qty: 30 mL, Refills: 0    HYDROcodone-acetaminophen (NORCO/VICODIN) 5-325 MG per tablet Take 1-2 tablets by mouth every 6 (six) hours as needed for moderate pain.    sulfamethoxazole-trimethoprim (BACTRIM DS) 800-160 MG per tablet Take 1 tablet by  mouth 2 (two) times daily. Qty: 6 tablet, Refills: 0      CONTINUE these medications which have NOT CHANGED   Details  azelastine (ASTELIN) 0.1 % nasal spray Place 1 spray into both nostrils 2 (two) times daily. Use in each nostril as directed Qty: 90 mL, Refills: 3   Associated Diagnoses: Seasonal allergies    fluticasone (FLONASE) 50 MCG/ACT nasal spray Place 2 sprays into both nostrils daily. Qty: 48 g, Refills: 3   Associated Diagnoses: Seasonal allergies    ibuprofen (ADVIL,MOTRIN) 200 MG tablet Take 200-800 mg by mouth every 6 (six) hours as needed for moderate pain.    levothyroxine (SYNTHROID) 88 MCG tablet Take 1 tablet (88 mcg total) by mouth daily. Qty: 90 tablet, Refills: 3   Associated Diagnoses: Unspecified hypothyroidism    valsartan (DIOVAN) 320 MG tablet TAKE 1 TABLET DAILY Qty: 90 tablet, Refills: 3   Associated Diagnoses: Essential hypertension    Vitamin D, Ergocalciferol, (DRISDOL) 50000 UNITS CAPS capsule Take 50,000 Units by mouth 3 (three) times a week.    Cholecalciferol (VITAMIN D3 PO) Take 1 capsule by mouth 3 (three) times a week.       Allergies  Allergen Reactions  . Statins Diarrhea  . Eggs Or Egg-Derived Products Diarrhea and Rash   Follow-up Information    Follow up with NORRIS,STEVEN R, MD. Call in 2 weeks.   Specialty:  Orthopedic Surgery   Why:  [phone]   Contact information:   [address] [place] [address] [phone]        The results of significant diagnostics from this hospitalization (including imaging, microbiology, ancillary and laboratory) are listed below for reference.    Significant Diagnostic Studies: Dg Chest 1 View  12/15/2014   CLINICAL DATA:  Preoperative hip surgery.  Hypertension  EXAM: CHEST - 1 VIEW  COMPARISON:  None.  FINDINGS: Lungs are clear. Heart size and pulmonary vascularity are normal. No adenopathy.  There is a sclerotic lesion in the proximal right humerus, incompletely  visualized.  IMPRESSION: No edema or consolidation. Sclerotic lesion proximal right humerus, incompletely visualized. This lesion most likely represents either a bone infarct or enchondroma. Right humerus radiographs to better characterize this lesion advised.   Electronically Signed   By: Lowella Grip M.D.   On: 12/15/2014 20:01   Dg Hip Complete Left  12/15/2014   CLINICAL DATA:  Fall in parking lot. Left hip pain. Initial encounter.  EXAM: LEFT HIP - COMPLETE 2+ VIEW  COMPARISON:  None.  FINDINGS: The bones appear adequately mineralized. There is an acute comminuted and angulated intertrochanteric left femur fracture. The femoral head appears intact. There is no dislocation or evidence of pelvic fracture. There are mild underlying degenerative changes of both hips and sacroiliac joints.  IMPRESSION: Acute, comminuted and angulated intertrochanteric left femur fracture.   Electronically Signed   By: Camie Patience M.D.   On: 12/15/2014 19:19   Dg Hip Operative Left  12/16/2014   CLINICAL DATA:  Internal fixation of left femoral intertrochanteric fracture. Intraoperative radiographs. Subsequent encounter.  EXAM: OPERATIVE LEFT HIP  COMPARISON:  Left hip radiographs performed earlier today at  6:46 p.m.  FINDINGS: Five fluoroscopic C-arm images demonstrate successful placement of an intramedullary rod and screws across the patient's comminuted intertrochanteric fracture of the proximal left femur. A displaced lesser trochanteric fragment is again seen. The fracture is noted in near anatomic alignment. No new fractures are identified. The left femoral head remains seated at the acetabulum.  IMPRESSION: Successful internal fixation of left femoral comminuted intertrochanteric fracture in near anatomic alignment.   Electronically Signed   By: Garald Balding M.D.   On: 12/16/2014 00:50   Dg Shoulder Right Port  12/15/2014   CLINICAL DATA:  Right humeral lesion on prior exam, no trauma or shoulder symptoms   EXAM: PORTABLE RIGHT SHOULDER - 2+ VIEW  COMPARISON:  Chest radiograph 12/15/2014 is reviewed. There is no other prior imaging at this institution for comparison.  FINDINGS: Sclerotic lesion within the proximal right humerus is reidentified. No fracture or dislocation. Right lung apex is clear.  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 No acute abnormality. Unless the patient has right upper extremity symptoms, no further specific imaging followup is likely required for this probably benign finding.   Electronically Signed   By: Conchita Paris M.D.   On: 12/15/2014 21:08    Microbiology: Recent Results (from the past 240 hour(s))  Culture, Urine     Status: None   Collection Time: 12/15/14  8:12 PM  Result Value Ref Range Status   Specimen Description URINE, CLEAN CATCH  Final   Special Requests NONE  Final   Culture  Setup Time   Final    12/16/2014 19:05 Performed at [facility]   Final    70,000 COLONIES/ML Performed at [facility]    Culture   Final    ESCHERICHIA COLI Note: Confirmed Extended Spectrum Beta-Lactamase Producer (ESBL) CRITICAL RESULT CALLED TO, READ BACK BY AND VERIFIED WITH: KATIE SILK 12/24 AT 1020 BY DUNNJ Performed at [facility]    Report Status 12/18/2014 FINAL  Final   Organism ID, Bacteria ESCHERICHIA COLI  Final      Susceptibility   Escherichia coli - MIC*    AMPICILLIN >=32 RESISTANT Resistant     CEFAZOLIN >=64 RESISTANT Resistant     CEFTRIAXONE >=64 RESISTANT Resistant     CIPROFLOXACIN >=4 RESISTANT Resistant     GENTAMICIN <=1 SENSITIVE Sensitive     LEVOFLOXACIN >=8 RESISTANT Resistant     NITROFURANTOIN 32 SENSITIVE Sensitive     TOBRAMYCIN <=1 SENSITIVE Sensitive     TRIMETH/SULFA <=20 SENSITIVE Sensitive     IMIPENEM <=0.25 SENSITIVE Sensitive     PIP/TAZO <=4 SENSITIVE Sensitive     * ESCHERICHIA COLI     Labs: Basic Metabolic Panel:  Recent Labs Lab  12/15/14 1818 12/16/14 0504 12/17/14 0500 12/18/14 0443  NA 136* 136 133* 136  K 4.0 4.5 4.0 4.0  CL 97 104 104 106  CO2 21 24 26 26   GLUCOSE 168* 181* 157* 139*  BUN 16 19 16 11   CREATININE 0.88 0.80 0.74 0.67  CALCIUM 10.1 8.6 8.1* 8.4   Liver Function Tests:  Recent Labs Lab 12/15/14 1818  AST 31  ALT 35  ALKPHOS 54  BILITOT 0.4  PROT 7.6  ALBUMIN 4.2   No results for input(s): LIPASE, AMYLASE in the last 168 hours. No results for input(s): AMMONIA in the last 168 hours. CBC:  Recent Labs Lab 12/15/14 1818 12/16/14 0504 12/17/14 0500 12/18/14 0443  WBC 13.9*  16.5* 10.2 12.9*  NEUTROABS 9.7*  --   --   --   HGB 13.4 11.1* 9.0* 9.4*  HCT 40.5 35.0* 28.3* 29.0*  MCV 87.7 89.3 89.6 88.4  PLT 313 302 211 238   Cardiac Enzymes: No results for input(s): CKTOTAL, CKMB, CKMBINDEX, TROPONINI in the last 168 hours. BNP: BNP (last 3 results) No results for input(s): PROBNP in the last 8760 hours. CBG: No results for input(s): GLUCAP in the last 168 hours.     Signed:  Khiana Camino A  [facility] 12/18/2014, 11:06 AM

## 2014-12-18 NOTE — Progress Notes (Signed)
Lab informed RN that patient's urine culture showed ESBL. Patient placed on contact precautions and educated.

## 2014-12-18 NOTE — Progress Notes (Signed)
Subjective: 3 Days Post-Op Procedure(s) (LRB): INTRAMEDULLARY (IM) NAIL INTERTROCHANTRIC (Left) Patient reports pain as 3 on 0-10 scale.    Objective: Vital signs in last 24 hours: Temp:  [98.6 F (37 C)-99.1 F (37.3 C)] 99.1 F (37.3 C) (12/24 0511) Pulse Rate:  [100-111] 100 (12/24 0511) Resp:  [16] 16 (12/24 0511) BP: (116-138)/(49-64) 136/53 mmHg (12/24 0511) SpO2:  [91 %-98 %] 92 % (12/24 0511)  Intake/Output from previous day: 12/23 0701 - 12/24 0700 In: 1873.3 [P.O.:237; I.V.:1436.3; IV Piggyback:200] Out: 1550 [Urine:1550] Intake/Output this shift: Total I/O In: 340 [P.O.:240; IV Piggyback:100] Out: 400 [Urine:400]   Recent Labs  12/15/14 1818 12/16/14 0504 12/17/14 0500 12/18/14 0443  HGB 13.4 11.1* 9.0* 9.4*    Recent Labs  12/17/14 0500 12/18/14 0443  WBC 10.2 12.9*  RBC 3.16* 3.28*  HCT 28.3* 29.0*  PLT 211 238    Recent Labs  12/17/14 0500 12/18/14 0443  NA 133* 136  K 4.0 4.0  CL 104 106  CO2 26 26  BUN 16 11  CREATININE 0.74 0.67  GLUCOSE 157* 139*  CALCIUM 8.1* 8.4    Recent Labs  12/15/14 1818  INR 0.94    Neurologically intact Neurovascular intact Dorsiflexion/Plantar flexion intact Incision: dressing C/D/I and no drainage No sign of infection No DVT Assessment/Plan: 3 Days Post-Op Procedure(s) (LRB): INTRAMEDULLARY (IM) NAIL INTERTROCHANTRIC (Left) Advance diet Discharge to SNF  NWB ISB q1hour w/a discussed with patient  Rosslyn Pasion C 12/18/2014, 12:16 PM

## 2014-12-18 NOTE — Progress Notes (Signed)
Charlett Blake, MD Physician Signed Physical Medicine and Rehabilitation Consult Note 12/17/2014 2:10 PM  Related encounter: ED to Hosp-Admission (Current) from 12/15/2014 in [facility] All Collapse All        Physical Medicine and Rehabilitation Consult Reason for Consult: Left comminuted displaced peritrochanteric femur fracture Referring Physician: [facility]   HPI: [name] is a 63 y.o. right handed female admitted 12/15/2014 after a mechanical fall in the parking lot without loss of consciousness. She lives with her spouse and was independent prior to admission. Patient with immediate left hip pain. X-rays and imaging revealed a left comminuted displaced peritrochanteric femur fracture. Underwent ORIF with [employer] intramedullary nail 12/16/2014 per Dr. Veverly Fells. Nonweightbearing left lower extremity. Hospital course pain management. Subcutaneous Lovenox for DVT prophylaxis. Urine study ESBL UTI placed on Primaxin. Acute blood loss anemia 9.0. Physical therapy evaluation completed 12/17/2014 with recommendations of physical medicine rehabilitation consult.   Review of Systems  Gastrointestinal: Positive for constipation.  Musculoskeletal: Positive for myalgias.  All other systems reviewed and are negative.  Past Medical History  Diagnosis Date  . 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 Chickenpox   . Genital warts   . Environmental allergies   . Heart murmur   . HTN (hypertension)   . Hyperlipidemia   . Thyroid disease     Hypothyroid  . Thyroid nodule    Past Surgical History  Procedure Laterality Date  . Carpal tunnel release      Both wrist  . Cystectomy      From the cervix  . Intramedullary (im) nail intertrochanteric Left 12/15/2014    Procedure: INTRAMEDULLARY (IM) NAIL INTERTROCHANTRIC; Surgeon: Augustin Schooling, MD; Location: [facility];  Service: Orthopedics; Laterality: Left;   Family History  Problem Relation Age of Onset  . Arthritis Mother   . Hyperlipidemia Mother   . Heart disease Father   . Stroke Father     x's 2  . High blood pressure Father   . High blood pressure Mother   . Diabetes Mother   . Myasthenia gravis Mother    Social History:  reports that she has never smoked. She has never used smokeless tobacco. She reports that she drinks alcohol. She reports that she does not use illicit drugs. Allergies:  Allergies  Allergen Reactions  . Statins Diarrhea  . Eggs Or Egg-Derived Products Diarrhea and Rash   Medications Prior to Admission  Medication Sig Dispense Refill  . azelastine (ASTELIN) 0.1 % nasal spray Place 1 spray into both nostrils 2 (two) times daily. Use in each nostril as directed 90 mL 3  . fluticasone (FLONASE) 50 MCG/ACT nasal spray Place 2 sprays into both nostrils daily. 48 g 3  . ibuprofen (ADVIL,MOTRIN) 200 MG tablet Take 200-800 mg by mouth every 6 (six) hours as needed for moderate pain.    Marland Kitchen levothyroxine (SYNTHROID) 88 MCG tablet Take 1 tablet (88 mcg total) by mouth daily. 90 tablet 3  . valsartan (DIOVAN) 320 MG tablet TAKE 1 TABLET DAILY 90 tablet 3  . Vitamin D, Ergocalciferol, (DRISDOL) 50000 UNITS CAPS capsule Take 50,000 Units by mouth 3 (three) times a week.    . Cholecalciferol (VITAMIN D3 PO) Take 1 capsule by mouth 3 (three) times a week.      Home: Home Living Family/patient expects to be discharged to:: Inpatient rehab Living Arrangements: Spouse/significant other Additional Comments: Pt is hoping for admission to CIR  Functional History: Prior Function Level of Independence: Independent Comments: has a cane, but was not using prior Functional Status:  Mobility: Bed Mobility Overal bed mobility: +2 for physical assistance, Needs Assistance Bed Mobility: Supine to  Sit Supine to sit: +2 for physical assistance, Max assist Sit to supine: +2 for physical assistance, Total assist General bed mobility comments: assist to advance BLEs and to raise trunk, pt 25%, limited by pain. Much use of bed pad to scoot hips to get pt to EOB. Transfers Overall transfer level: Needs assistance Equipment used: None Transfers: Lateral/Scoot Transfers Lateral/Scoot Transfers: Max assist, +2 physical assistance, From elevated surface General transfer comment: increased time and much effort to perform lateral scoot from elevated bed to drop arm recliner.      ADL: ADL Overall ADL's : Needs assistance/impaired Eating/Feeding: Set up, Sitting Grooming: Wash/dry hands, Wash/dry face, Oral care, Brushing hair, Set up, Sitting Upper Body Bathing: Minimal assitance, Sitting Lower Body Bathing: Total assistance, Sitting/lateral leans Upper Body Dressing : Minimal assistance, Sitting Lower Body Dressing: Total assistance, Sitting/lateral leans Lower Body Dressing Details (indicate cue type and reason): Pt instructed in use of AE for LB ADLs. She is able do don/doff socks with min A. She is unable to safely stand with +1 assist at this time.  Toilet Transfer: Total assistance Toilet Transfer Details (indicate cue type and reason): unable to safely perform with +1 assist Toileting- Clothing Manipulation and Hygiene: Total assistance, Sitting/lateral lean Tub/ Shower Transfer: Total assistance Tub/Shower Transfer Details (indicate cue type and reason): unable Functional mobility during ADLs: Maximal assistance, +2 for physical assistance (for scoot transfers) General ADL Comments: Pt fatigues quickly   Cognition: Cognition Overall Cognitive Status: Within Functional Limits for tasks assessed Orientation Level: Oriented X4 Cognition Arousal/Alertness: Awake/alert Behavior During Therapy: WFL for tasks assessed/performed Overall Cognitive Status: Within Functional Limits  for tasks assessed  Blood pressure 101/47, pulse 106, temperature 99.2 F (37.3 C), temperature source Oral, resp. rate 14, height 5\' 5"  (1.651 m), weight 87.544 kg (193 lb), SpO2 92 %. Physical Exam  Constitutional: She is oriented to person, place, and time. She appears well-developed.  HENT:  Head: Normocephalic.  Eyes: EOM are normal.  Neck: Normal range of motion. Neck supple. No thyromegaly present.  Cardiovascular: Normal rate and regular rhythm.  Respiratory: Effort normal and breath sounds normal. No respiratory distress.  GI: Soft. Bowel sounds are normal. She exhibits no distension.  Neurological: She is alert and oriented to person, place, and time.  Skin:  Left hip incision is dressed appropriately tender     Lab Results Last 24 Hours    Results for orders placed or performed during the hospital encounter of 12/15/14 (from the past 24 hour(s))  CBC Status: Abnormal   Collection Time: 12/17/14 5:00 AM  Result Value Ref Range   WBC 10.2 4.0 - 10.5 K/uL   RBC 3.16 (L) 3.87 - 5.11 MIL/uL   Hemoglobin 9.0 (L) 12.0 - 15.0 g/dL   HCT 28.3 (L) 36.0 - 46.0 %   MCV 89.6 78.0 - 100.0 fL   MCH 28.5 26.0 - 34.0 pg   MCHC 31.8 30.0 - 36.0 g/dL   RDW 13.6 11.5 - 15.5 %   Platelets 211 150 - 400 K/uL  Basic metabolic panel Status: Abnormal   Collection Time: 12/17/14 5:00 AM  Result Value Ref Range   Sodium 133 (L) 135 - 145 mmol/L   Potassium 4.0 3.5 - 5.1 mmol/L   Chloride 104 96 - 112 mEq/L  CO2 26 19 - 32 mmol/L   Glucose, Bld 157 (H) 70 - 99 mg/dL   BUN 16 6 - 23 mg/dL   Creatinine, Ser 0.74 0.50 - 1.10 mg/dL   Calcium 8.1 (L) 8.4 - 10.5 mg/dL   GFR calc non Af Amer 89 (L) >90 mL/min   GFR calc Af Amer >90 >90 mL/min   Anion gap 3 (L) 5 - 15      Imaging Results (Last 48 hours)    Dg Chest 1 View  12/15/2014 CLINICAL DATA: Preoperative hip surgery.  Hypertension EXAM: CHEST - 1 VIEW COMPARISON: None. FINDINGS: Lungs are clear. Heart size and pulmonary vascularity are normal. No adenopathy. There is a sclerotic lesion in the proximal right humerus, incompletely visualized. IMPRESSION: No edema or consolidation. Sclerotic lesion proximal right humerus, incompletely visualized. This lesion most likely represents either a bone infarct or enchondroma. Right humerus radiographs to better characterize this lesion advised. Electronically Signed By: Lowella Grip M.D. On: 12/15/2014 20:01   Dg Hip Complete Left  12/15/2014 CLINICAL DATA: Fall in parking lot. Left hip pain. Initial encounter. EXAM: LEFT HIP - COMPLETE 2+ VIEW COMPARISON: None. FINDINGS: The bones appear adequately mineralized. There is an acute comminuted and angulated intertrochanteric left femur fracture. The femoral head appears intact. There is no dislocation or evidence of pelvic fracture. There are mild underlying degenerative changes of both hips and sacroiliac joints. IMPRESSION: Acute, comminuted and angulated intertrochanteric left femur fracture. Electronically Signed By: Camie Patience M.D. On: 12/15/2014 19:19   Dg Hip Operative Left  12/16/2014 CLINICAL DATA: Internal fixation of left femoral intertrochanteric fracture. Intraoperative radiographs. Subsequent encounter. EXAM: OPERATIVE LEFT HIP COMPARISON: Left hip radiographs performed earlier today at 6:46 p.m. FINDINGS: Five fluoroscopic C-arm images demonstrate successful placement of an intramedullary rod and screws across the patient's comminuted intertrochanteric fracture of the proximal left femur. A displaced lesser trochanteric fragment is again seen. The fracture is noted in near anatomic alignment. No new fractures are identified. The left femoral head remains seated at the acetabulum. IMPRESSION: Successful internal fixation of left femoral comminuted intertrochanteric fracture in near  anatomic alignment. Electronically Signed By: Garald Balding M.D. On: 12/16/2014 00:50   Dg Shoulder Right Port  12/15/2014 CLINICAL DATA: Right humeral lesion on prior exam, no trauma or shoulder symptoms EXAM: PORTABLE RIGHT SHOULDER - 2+ VIEW COMPARISON: Chest radiograph 12/15/2014 is reviewed. There is no other prior imaging at this institution for comparison. FINDINGS: Sclerotic lesion within the proximal right humerus is reidentified. No fracture or dislocation. Right lung apex is clear. 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 No acute abnormality. Unless the patient has right upper extremity symptoms, no further specific imaging followup is likely required for this probably benign finding. Electronically Signed By: Conchita Paris M.D. On: 12/15/2014 21:08     Assessment/Plan: Diagnosis: left comminuted displaced peritrochanteric femur fracture. 1. Does the need for close, 24 hr/day medical supervision in concert with the patient's rehab needs make it unreasonable for this patient to be served in a less intensive setting? Yes 2. Co-Morbidities requiring supervision/potential complications: HTN, S/P IM Nail POD#3 3. Due to bladder management, bowel management, safety, skin/wound care, disease management, medication administration, pain management and patient education, does the patient require 24 hr/day rehab nursing? Yes 4. Does the patient require coordinated care of a physician, rehab nurse, PT (1-2 hrs/day, 5 days/week) and OT (1-2 hrs/day, 5 days/week) to address physical and functional deficits in the context of the above medical diagnosis(es)? Yes Addressing  deficits in the following areas: balance, endurance, locomotion, strength, transferring, bowel/bladder control, bathing, dressing, feeding, grooming and toileting 5. Can the patient actively participate in an intensive therapy program of at least 3 hrs of therapy per day at least 5  days per week? Yes 6. The potential for patient to make measurable gains while on inpatient rehab is good 7. Anticipated functional outcomes upon discharge from inpatient rehab are supervision with PT, supervision with OT, n/a with SLP. 8. Estimated rehab length of stay to reach the above functional goals is: 7-10 days 9. Does the patient have adequate social supports and living environment to accommodate these discharge functional goals? Yes 10. Anticipated D/C setting: Home 11. Anticipated post D/C treatments: [facility] therapy 12. Overall Rehab/Functional Prognosis: excellent  RECOMMENDATIONS: This patient's condition is appropriate for continued rehabilitative care in the following setting: CIR Patient has agreed to participate in recommended program. Potentially Note that insurance prior authorization may be required for reimbursement for recommended care.  Comment:     12/17/2014       Revision History     Date/Time User Provider Type Action   12/18/2014 9:12 AM Charlett Blake, MD Physician Sign   12/18/2014 6:36 AM Cathlyn Parsons, PA-C Physician Assistant Pend   View Details Report       Routing History     Date/Time From To Method   12/18/2014 9:12 AM Charlett Blake, MD Charlett Blake, MD In Basket   12/18/2014 9:12 AM Charlett Blake, MD Rosalita Chessman, DO In Basket

## 2014-12-18 NOTE — Progress Notes (Signed)
I have  insurance approval and can admit pt to intp rehab today. I spoke with pt by phone and she is in agreement. I will make the arrangements. [phone]

## 2014-12-18 NOTE — Progress Notes (Signed)
Physical Therapy Treatment Patient Details Name: [name] MRN: [id number] DOB: 05-30-1951 Today's Date: 12/18/2014    History of Present Illness Pt fell in the parking lot of the hospital after visiting her husband resulting in L displaced intertrochanteric fx. S/P IM nail with strict non weightbearing precautions on the LLE.    PT Comments    POD # 3 pt progressing.  Performed L LE TE's while supine in bed.  Assisted OOB to [facility] to void.  Assisted with hygiene.  Assisted to recliner.  1/4 turn pivot with RW and NWB LE.  Pt demonstarting increased ability to self rise and pivot.    Follow Up Recommendations  CIR     Equipment Recommendations       Recommendations for Other Services       Precautions / Restrictions Precautions Precautions: Fall Restrictions Weight Bearing Restrictions: Yes LUE Weight Bearing: Non weight bearing    Mobility  Bed Mobility Overal bed mobility: Needs Assistance Bed Mobility: Supine to Sit     Supine to sit: Max assist     General bed mobility comments: assisted supine to sit MAX assist with increased time and support L LE.  Difficulty scooting due to pain level.  Transfers Overall transfer level: Needs assistance Equipment used: Rolling walker (2 wheeled) Transfers: Sit to/from Stand Sit to Stand: Max assist         General transfer comment: 25% VC's on proper hand placement and increased time.  Pt was more able to self rise  Assisted from bed to [facility] then to recliner.  Ambulation/Gait             General Gait Details: transfers only so far due to difficulty maintaining NWB L LE.   Stairs            Wheelchair Mobility    Modified Rankin (Stroke Patients Only)       Balance                                    Cognition                            Exercises   L LE TE's 10 reps ankle pumps 10 reps knee presses 10 reps heel slides AAROM 10 reps SAQ's  10 reps ABD  AAROM Followed by ICE     General Comments        Pertinent Vitals/Pain Pain Assessment: 0-10 Pain Score: 6  Pain Location: L hip/thigh Pain Descriptors / Indicators: Constant;Sore Pain Intervention(s): Monitored during session;Premedicated before session;Repositioned;Ice applied    Home Living  prior home with spouse completely Indep/driving/working from home  Lives in a one level home with a 3 step step entry rail on L                     Prior Function            PT Goals (current goals can now be found in the care plan section) Progress towards PT goals: Progressing toward goals    Frequency       PT Plan      Co-evaluation             End of Session Equipment Utilized During Treatment: Gait belt Activity Tolerance: Patient tolerated treatment well Patient left: in chair;with call bell/phone within reach;with family/visitor present  Time: 9147-8295 PT Time Calculation (min) (ACUTE ONLY): 43 min  Charges:  $Gait Training: 8-22 mins $Therapeutic Exercise: 8-22 mins $Therapeutic Activity: 8-22 mins                    G Codes:      Rica Koyanagi  PTA [facility]  Acute  Rehab Pager      [phone]

## 2014-12-18 NOTE — Progress Notes (Signed)
[name], RN Rehab Admission Coordinator Signed Physical Medicine and Rehabilitation PMR Pre-admission 12/18/2014 2:14 PM  Related encounter: ED to Hosp-Admission (Current) from 12/15/2014 in [facility] Collapse All   PMR Admission Coordinator Pre-Admission Assessment  Patient: [name] is an 63 y.o., female MRN: [id number] DOB: 1951-03-21 Height: 5\' 5"  (165.1 cm) Weight: 87.544 kg (193 lb)  Insurance Information HMO: PPO: yes PCP: IPA: 80/20: OTHER:  PRIMARY: [employer] Policy#: [id number] Subscriber: pt CM Name: Lannette Donath Phone#: [phone] Ext [phone] Fax#: verbals Pre-Cert#: [id number] [place] for 5 days with update due 12/28 to [place] phone [phone] fax [phone] Employer:  Benefits: Phone #: [phone] Name: 12/18/14 Eff. Date: 1/13 Deduct: none Out of Pocket Max: $5000 Life Max: none CIR: 90% SNF: not covered without CM negotiation Outpatient: $275 deductible then 90% Co-Pay: 60 visits combined Home Health: $275 deductible then 90% Co-Pay: 25 visits combined DME: $275 deductible then covered 90% Co-Pay: 10% Providers: in network  SECONDARY: none  Medicaid Application Date: Case Manager:  Disability Application Date: Case Worker:   Emergency Facilities manager Information    Name Relation Home Work Mobile   [place] Spouse [phone]       Current Medical History  Patient Admitting Diagnosis: left comminuted displaced peritrochanteric femur fracture  History of Present Illness: [name] is a 63 y.o. right handed female admitted 12/15/2014 after a mechanical fall in the parking lot without loss of consciousness.  She lives with her spouse and was independent prior to admission. Patient with immediate left hip pain. X-rays and imaging revealed a left comminuted displaced peritrochanteric femur fracture. Underwent ORIF with Biomet intramedullary nail 12/16/2014 per Dr. Veverly Fells. Nonweightbearing left lower extremity. Hospital course pain management. Subcutaneous Lovenox for DVT prophylaxis. Urine study ESBL UTI placed on Primaxin. Acute blood loss anemia 9.0.   Past Medical History  Past Medical History  Diagnosis Date  . 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 Chickenpox   . Genital warts   . Environmental allergies   . Heart murmur   . HTN (hypertension)   . Hyperlipidemia   . Thyroid disease     Hypothyroid  . Thyroid nodule     Family History  family history includes Arthritis in her mother; Diabetes in her mother; Heart disease in her father; High blood pressure in her father and mother; Hyperlipidemia in her mother; Myasthenia gravis in her mother; Stroke in her father.  Prior Rehab/Hospitalizations: none. Has had Issues with osteoarthritis l knee and had cortisone injections in the past  Current Medications  Current facility-administered medications: acetaminophen (TYLENOL) tablet 650 mg, 650 mg, Oral, Q6H PRN **OR** acetaminophen (TYLENOL) suppository 650 mg, 650 mg, Rectal, Q6H PRN, Augustin Schooling, MD; cholecalciferol (VITAMIN D) tablet 1,000 Units, 1,000 Units, Oral, Once per day on Mon Wed Fri, Steven R Norris, MD, 1,000 Units at 12/17/14 5631 enoxaparin (LOVENOX) injection 40 mg, 40 mg, Subcutaneous, Q24H, Jared M Gardner, DO, 40 mg at 12/17/14 1739; feeding supplement (RESOURCE BREEZE) (RESOURCE BREEZE) liquid 1 Container, 1 Container, Oral, BID BM, Clayton Bibles, RD, 1 Container at 12/18/14 0900; fluticasone (FLONASE) 50 MCG/ACT nasal spray 2 spray, 2 spray, Each Nare, Daily, Etta Quill, DO, 2 spray at 12/18/14  0745 HYDROcodone-acetaminophen (NORCO/VICODIN) 5-325 MG per tablet 1-2 tablet, 1-2 tablet, Oral, Q6H PRN, Etta Quill, DO, 2 tablet at 12/18/14 1024; ibuprofen (ADVIL,MOTRIN) tablet 200-800 mg, 200-800 mg, Oral, Q6H PRN,  Augustin Schooling, MD, 800 mg at 12/17/14 2006; irbesartan (AVAPRO) tablet 300 mg, 300 mg, Oral, Daily, Etta Quill, DO, 300 mg at 12/18/14 0745 levothyroxine (SYNTHROID, LEVOTHROID) tablet 88 mcg, 88 mcg, Oral, QAC breakfast, Etta Quill, DO, 88 mcg at 12/18/14 0745; menthol-cetylpyridinium (CEPACOL) lozenge 3 mg, 1 lozenge, Oral, PRN **OR** phenol (CHLORASEPTIC) mouth spray 1 spray, 1 spray, Mouth/Throat, PRN, Augustin Schooling, MD methocarbamol (ROBAXIN) tablet 500 mg, 500 mg, Oral, Q6H PRN, 500 mg at 12/17/14 2133 **OR** methocarbamol (ROBAXIN) 500 mg in dextrose 5 % 50 mL IVPB, 500 mg, Intravenous, Q6H PRN, Augustin Schooling, MD, 500 mg at 12/16/14 0121; metoCLOPramide (REGLAN) tablet 5-10 mg, 5-10 mg, Oral, Q8H PRN **OR** metoCLOPramide (REGLAN) injection 5-10 mg, 5-10 mg, Intravenous, Q8H PRN, Augustin Schooling, MD morphine 2 MG/ML injection 0.5 mg, 0.5 mg, Intravenous, Q2H PRN, Etta Quill, DO; morphine 4 MG/ML injection 4 mg, 4 mg, Intravenous, Q30 min PRN, Charlesetta Shanks, MD, 4 mg at 12/15/14 1931; ondansetron (ZOFRAN) tablet 4 mg, 4 mg, Oral, Q6H PRN **OR** ondansetron (ZOFRAN) injection 4 mg, 4 mg, Intravenous, Q6H PRN, Augustin Schooling, MD, 4 mg at 12/18/14 0744 polyethylene glycol (MIRALAX / GLYCOLAX) packet 17 g, 17 g, Oral, Daily PRN, Augustin Schooling, MD, 17 g at 12/17/14 2011; Vitamin D (Ergocalciferol) (DRISDOL) capsule 50,000 Units, 50,000 Units, Oral, Once per day on Mon Wed Fri, Steven R Norris, MD, 50,000 Units at 12/17/14 3614  Patients Current Diet: Diet regular Diet - low sodium heart healthy  Precautions / Restrictions Precautions Precautions: Fall Restrictions Weight Bearing Restrictions: Yes LUE Weight Bearing: Non weight bearing   Prior  Activity Level Community (5-7x/wk): 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  Spouse unemployed and can provide 24/7 supervision. He was recently hospitalized for ITP. Pt fell in [facility] parking lot when visiting husband.  Home Assistive Devices / Equipment Home Assistive Devices/Equipment: Eyeglasses  Prior Functional Level Prior Function Level of Independence: Independent Comments: has a cane, but was not using prior  Current Functional Level Cognition  Overall Cognitive Status: Within Functional Limits for tasks assessed Orientation Level: Oriented X4   Extremity Assessment (includes Sensation/Coordination)          ADLs  Overall ADL's : Needs assistance/impaired Eating/Feeding: Set up, Sitting Grooming: Wash/dry hands, Wash/dry face, Oral care, Brushing hair, Set up, Sitting Upper Body Bathing: Minimal assitance, Sitting Lower Body Bathing: Total assistance, Sitting/lateral leans Upper Body Dressing : Minimal assistance, Sitting Lower Body Dressing: Total assistance, Sitting/lateral leans Lower Body Dressing Details (indicate cue type and reason): Pt instructed in use of AE for LB ADLs. She is able do don/doff socks with min A. She is unable to safely stand with +1 assist at this time.  Toilet Transfer: Total assistance Toilet Transfer Details (indicate cue type and reason): unable to safely perform with +1 assist Toileting- Clothing Manipulation and Hygiene: Total assistance, Sitting/lateral lean Tub/ Shower Transfer: Total assistance Tub/Shower Transfer Details (indicate cue type and reason): unable Functional mobility during ADLs: Maximal assistance, +2 for physical assistance (for scoot transfers) General ADL Comments: Pt fatigues quickly     Mobility  Overal bed mobility: Needs Assistance Bed Mobility: Supine to Sit Supine to sit: Max assist Sit to supine: +2 for physical assistance, Total assist General bed mobility comments: assisted supine to  sit MAX assist with increased time and support L LE. Difficulty scooting due to pain level.    Transfers  Overall transfer level: Needs assistance Equipment used: Rolling  walker (2 wheeled) Transfers: Sit to/from Stand Sit to Stand: Max assist Lateral/Scoot Transfers: Max assist, +2 physical assistance, From elevated surface General transfer comment: 25% VC's on proper hand placement and increased time. Pt was more able to self rise Assisted from bed to [facility] then to recliner.    Ambulation / Gait / Stairs / Wheelchair Mobility  Ambulation/Gait General Gait Details: transfers only so far due to difficulty maintaining NWB L LE.    Posture / Balance Dynamic Sitting Balance Sitting balance - Comments: initally max assist 2* posterior lean, then progressed to supervision (only R foot supported, pt unable to tolerate lowering LLE to floor). Pt sat on EOB x 8 min.     Special needs/care consideration Bowel mgmt: no BM since OR Bladder mgmt: continent Pain limiting progression of mobility and difficulty with NWB status  CM from [employer] to assist with planning is Janifer Adie. Phone [phone] ext [phone]. I called and left her a message 12/24 to discuss SNF benefits if needed.   Previous Home Environment Living Arrangements: Spouse/significant other Lives With: Spouse Available Help at Discharge: Family Type of Home: House Home Layout: One level Home Access: Stairs to enter Entrance Stairs-Rails: Left Entrance Stairs-Number of Steps: 3 steps Bathroom Shower/Tub: Chiropodist: Standard Bathroom Accessibility: Yes How Accessible: Accessible via walker [place]: No Additional Comments: Pt is hoping for admission to CIR  Discharge Living Setting Plans for Discharge Living Setting: Patient's home, Lives with (comment), Other (Comment) (spouse) Type of Home at Discharge: House Discharge Home Layout: One level Discharge Home Access: Stairs  to enter Entrance Stairs-Rails: Left Entrance Stairs-Number of Steps: 3 steps Discharge Bathroom Shower/Tub: Tub/shower unit Discharge Bathroom Toilet: Standard Discharge Bathroom Accessibility: Yes How Accessible: Accessible via walker Does the patient have any problems obtaining your medications?: No  Social/Family/Support Systems Patient Roles: Spouse, Other (Comment) (employee) Contact Information: Alma Friendly, spouse Anticipated Caregiver: spouse Anticipated Caregiver's Contact Information: see above Ability/Limitations of Caregiver: none. unemployed. recently hospitalized for ITP per wife Caregiver Availability: 24/7 Discharge Plan Discussed with Primary Caregiver: Yes Is Caregiver In Agreement with Plan?: Yes Does Caregiver/Family have Issues with Lodging/Transportation while Pt is in Rehab?: No  Goals/Additional Needs Patient/Family Goal for Rehab: Mod I with PT and OT Expected length of stay: ELOS 7 to 10 days Special Service Needs: Pt fell in parking lot when visiting her husband who was hospitalized at the time. Walking up enbankment with pine needles   Decrease burden of Care through IP rehab admission: n/a  Possible need for SNF placement upon discharge:not anticipated. [employer] quotes no SNF benefits without CM involvement for negotiation.  Patient Condition: This patient's condition remains as documented in the consult dated 12/18/2014 , in which the Rehabilitation Physician determined and documented that the patient's condition is appropriate for intensive rehabilitative care in an inpatient rehabilitation facility. Will admit to inpatient rehab today.  Preadmission Screen Completed By: [name], 12/18/2014 2:25 PM ______________________________________________________________________  Discussed status with Dr. Letta Pate on 12/18/2014 at 1425 and received telephone approval for admission today.  Admission Coordinator: [name],  time 6720 Date 12/18/2014.          Cosigned by: Charlett Blake, MD at 12/18/2014 2:28 PM  Revision History     Date/Time User Provider Type Action   12/18/2014 2:28 PM Charlett Blake, MD Physician Cosign   12/18/2014 2:26 PM [name], RN Rehab Admission Coordinator Sign

## 2014-12-19 DIAGNOSIS — M7989 Other specified soft tissue disorders: Secondary | ICD-10-CM

## 2014-12-19 DIAGNOSIS — S7222XD Displaced subtrochanteric fracture of left femur, subsequent encounter for closed fracture with routine healing: Secondary | ICD-10-CM

## 2014-12-19 NOTE — H&P (Signed)
Physical Medicine and Rehabilitation Admission H&P   Chief Complaint  Patient presents with  . Fall  . Hip Pain  . Back Pain  : HPI: [name] is a 63 y.o. right handed female admitted 12/15/2014 after a mechanical fall in the parking lot without loss of consciousness. She lives with her spouse and was independent prior to admission. Patient with immediate left hip pain. X-rays and imaging revealed a left comminuted displaced peritrochanteric femur fracture. Underwent ORIF with Biomet intramedullary nail 12/16/2014 per Dr. Veverly Fells. Nonweightbearing left lower extremity. Hospital course pain management. Subcutaneous Lovenox for DVT prophylaxis. Urine study ESBL UTI placed on Primaxin and completed/contact precautions. Acute blood loss anemia 9.0. Physical therapy evaluation completed 12/17/2014 with recommendations of physical medicine rehabilitation consult. Patient was admitted for comprehensive rehabilitation program  Pain meds helpful , feels like she needs another now  ROS Review of Systems  Gastrointestinal: Positive for constipation.  Musculoskeletal: Positive for myalgias.  All other systems reviewed and are negative    Past Medical History  Diagnosis Date  . 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 Chickenpox   . Genital warts   . Environmental allergies   . Heart murmur   . HTN (hypertension)   . Hyperlipidemia   . Thyroid disease     Hypothyroid  . Thyroid nodule    Past Surgical History  Procedure Laterality Date  . Carpal tunnel release      Both wrist  . Cystectomy      From the cervix  . Intramedullary (im) nail intertrochanteric Left 12/15/2014    Procedure: INTRAMEDULLARY (IM) NAIL INTERTROCHANTRIC; Surgeon: Augustin Schooling, MD; Location: [facility]; Service: Orthopedics; Laterality: Left;   Family History  Problem Relation Age of Onset  . Arthritis Mother    . Hyperlipidemia Mother   . Heart disease Father   . Stroke Father     x's 2  . High blood pressure Father   . High blood pressure Mother   . Diabetes Mother   . Myasthenia gravis Mother    Social History:  reports that she has never smoked. She has never used smokeless tobacco. She reports that she drinks alcohol. She reports that she does not use illicit drugs. Allergies:  Allergies  Allergen Reactions  . Statins Diarrhea  . Eggs Or Egg-Derived Products Diarrhea and Rash   Medications Prior to Admission  Medication Sig Dispense Refill  . azelastine (ASTELIN) 0.1 % nasal spray Place 1 spray into both nostrils 2 (two) times daily. Use in each nostril as directed 90 mL 3  . fluticasone (FLONASE) 50 MCG/ACT nasal spray Place 2 sprays into both nostrils daily. 48 g 3  . ibuprofen (ADVIL,MOTRIN) 200 MG tablet Take 200-800 mg by mouth every 6 (six) hours as needed for moderate pain.    Marland Kitchen levothyroxine (SYNTHROID) 88 MCG tablet Take 1 tablet (88 mcg total) by mouth daily. 90 tablet 3  . valsartan (DIOVAN) 320 MG tablet TAKE 1 TABLET DAILY 90 tablet 3  . Vitamin D, Ergocalciferol, (DRISDOL) 50000 UNITS CAPS capsule Take 50,000 Units by mouth 3 (three) times a week.    . Cholecalciferol (VITAMIN D3 PO) Take 1 capsule by mouth 3 (three) times a week.      Home: Home Living Family/patient expects to be discharged to:: Inpatient rehab Living Arrangements: Spouse/significant other Additional Comments: Pt is hoping for admission to CIR  Functional History: Prior Function Level of Independence: Independent Comments: has a cane, but  was not using prior  Functional Status:  Mobility: Bed Mobility Overal bed mobility: Needs Assistance Bed Mobility: Supine to Sit Supine to sit: +2 for physical assistance, Max assist Sit to supine: +2 for physical assistance, Total assist General bed mobility comments:  asssited back to bed + 2 total assist pt 15% Transfers Overall transfer level: Needs assistance Equipment used: Rolling walker (2 wheeled) Transfers: Sit to/from Stand Sit to Stand: +2 physical assistance, Max assist, Total assist Lateral/Scoot Transfers: Max assist, +2 physical assistance, From elevated surface General transfer comment: 75% VC's on proper tech and hand placement. Assisted from recliner to [facility] then back to bed. 1/4 pivoted turns towards pt R.  Ambulation/Gait General Gait Details: transfers only so far due to difficulty maintaining NWB L LE.    ADL: ADL Overall ADL's : Needs assistance/impaired Eating/Feeding: Set up, Sitting Grooming: Wash/dry hands, Wash/dry face, Oral care, Brushing hair, Set up, Sitting Upper Body Bathing: Minimal assitance, Sitting Lower Body Bathing: Total assistance, Sitting/lateral leans Upper Body Dressing : Minimal assistance, Sitting Lower Body Dressing: Total assistance, Sitting/lateral leans Lower Body Dressing Details (indicate cue type and reason): Pt instructed in use of AE for LB ADLs. She is able do don/doff socks with min A. She is unable to safely stand with +1 assist at this time.  Toilet Transfer: Total assistance Toilet Transfer Details (indicate cue type and reason): unable to safely perform with +1 assist Toileting- Clothing Manipulation and Hygiene: Total assistance, Sitting/lateral lean Tub/ Shower Transfer: Total assistance Tub/Shower Transfer Details (indicate cue type and reason): unable Functional mobility during ADLs: Maximal assistance, +2 for physical assistance (for scoot transfers) General ADL Comments: Pt fatigues quickly   Cognition: Cognition Overall Cognitive Status: Within Functional Limits for tasks assessed Orientation Level: Oriented X4 Cognition Arousal/Alertness: Awake/alert Behavior During Therapy: WFL for tasks assessed/performed Overall Cognitive Status: Within Functional Limits for tasks  assessed  Physical Exam: Blood pressure 136/53, pulse 100, temperature 99.1 F (37.3 C), temperature source Oral, resp. rate 16, height _0  (1.651 m), weight 87.544 kg (193 lb), SpO2 92 %. Physical Exam Constitutional: She is oriented to person, place, and time. She appears well-developed.  HENT:  Head: Normocephalic.  Eyes: EOM are normal.  Neck: Normal range of motion. Neck supple. No thyromegaly present.  Cardiovascular: Normal rate and regular rhythm.  Respiratory: Effort normal and breath sounds normal. No respiratory distress.  GI: Soft. Bowel sounds are normal. She exhibits no distension.  Neurological: She is alert and oriented to person, place, and time.  Skin:  Left hip incision is dressed appropriately tender , No erythema but serous drainage upper hip incision Motor strength is 5/5 bilateral deltoids, biceps, triceps, grip, 4 minus right hip flexor and knee extensor and ankle dorsal flexor, 2 minus left hip flexor 3 minus knee extensor 4 minus ankle dorsal flexor on the left    Lab Results Last 48 Hours    Results for orders placed or performed during the hospital encounter of 12/15/14 (from the past 48 hour(s))  CBC Status: Abnormal   Collection Time: 12/17/14 5:00 AM  Result Value Ref Range   WBC 10.2 4.0 - 10.5 K/uL   RBC 3.16 (L) 3.87 - 5.11 MIL/uL   Hemoglobin 9.0 (L) 12.0 - 15.0 g/dL    Comment: DELTA CHECK NOTED REPEATED TO VERIFY    HCT 28.3 (L) 36.0 - 46.0 %   MCV 89.6 78.0 - 100.0 fL   MCH 28.5 26.0 - 34.0 pg   MCHC 31.8 30.0 -  36.0 g/dL   RDW 13.6 11.5 - 15.5 %   Platelets 211 150 - 400 K/uL    Comment: DELTA CHECK NOTED REPEATED TO VERIFY   Basic metabolic panel Status: Abnormal   Collection Time: 12/17/14 5:00 AM  Result Value Ref Range   Sodium 133 (L) 135 - 145 mmol/L    Comment: Please note change in reference range.   Potassium 4.0 3.5 - 5.1 mmol/L     Comment: Please note change in reference range.   Chloride 104 96 - 112 mEq/L   CO2 26 19 - 32 mmol/L   Glucose, Bld 157 (H) 70 - 99 mg/dL   BUN 16 6 - 23 mg/dL   Creatinine, Ser 0.74 0.50 - 1.10 mg/dL   Calcium 8.1 (L) 8.4 - 10.5 mg/dL   GFR calc non Af Amer 89 (L) >90 mL/min   GFR calc Af Amer >90 >90 mL/min    Comment: (NOTE) The eGFR has been calculated using the CKD EPI equation. This calculation has not been validated in all clinical situations. eGFR's persistently <90 mL/min signify possible Chronic Kidney Disease.    Anion gap 3 (L) 5 - 15  Basic metabolic panel Status: Abnormal   Collection Time: 12/18/14 4:43 AM  Result Value Ref Range   Sodium 136 135 - 145 mmol/L    Comment: Please note change in reference range.   Potassium 4.0 3.5 - 5.1 mmol/L    Comment: Please note change in reference range.   Chloride 106 96 - 112 mEq/L   CO2 26 19 - 32 mmol/L   Glucose, Bld 139 (H) 70 - 99 mg/dL   BUN 11 6 - 23 mg/dL   Creatinine, Ser 0.67 0.50 - 1.10 mg/dL   Calcium 8.4 8.4 - 10.5 mg/dL   GFR calc non Af Amer >90 >90 mL/min   GFR calc Af Amer >90 >90 mL/min    Comment: (NOTE) The eGFR has been calculated using the CKD EPI equation. This calculation has not been validated in all clinical situations. eGFR's persistently <90 mL/min signify possible Chronic Kidney Disease.    Anion gap 4 (L) 5 - 15  CBC Status: Abnormal   Collection Time: 12/18/14 4:43 AM  Result Value Ref Range   WBC 12.9 (H) 4.0 - 10.5 K/uL   RBC 3.28 (L) 3.87 - 5.11 MIL/uL   Hemoglobin 9.4 (L) 12.0 - 15.0 g/dL   HCT 29.0 (L) 36.0 - 46.0 %   MCV 88.4 78.0 - 100.0 fL   MCH 28.7 26.0 - 34.0 pg   MCHC 32.4 30.0 - 36.0 g/dL   RDW 13.3 11.5 - 15.5 %   Platelets 238 150 - 400 K/uL      Imaging Results (Last 48 hours)    No results found.        Medical Problem List and Plan: 1. Functional deficits secondary to left comminuted displaced peritrochanteric femur fracture. Status post ORIF. Nonweightbearing left lower extremity 2. DVT Prophylaxis/Anticoagulation: SQ Lovenox. Monitor for any bleeding episodes. Check vascular study 3. Pain Management: Hydrocodone and Robaxin as needed. Monitor with increased mobility on 4. Acute blood loss anemia. Follow-up CBC 5. Neuropsych: This patient is capable of making decisions on her own behalf. 6. Skin/Wound Care: Routine skin checks of surgical site 7. Fluids/Electrolytes/Nutrition: Strict I and O follow-up chemistries 8.ESBL?UTI.Primaxin completed/contact precaution 9. Hypertension. Avapro 300 mg daily. Monitor closely with increased mobility 10. Hypothyroidism. Synthroid    Post Admission Physician Evaluation: 1. Functional deficits secondary to  left comminuted displaced peritrochanteric femur fracture. 2. Patient is admitted to receive collaborative, interdisciplinary care between the physiatrist, rehab nursing staff, and therapy team. 3. Patient's level of medical complexity and substantial therapy needs in context of that medical necessity cannot be provided at a lesser intensity of care such as a SNF. 4. Patient has experienced substantial functional loss from his/her baseline which was documented above under the "Functional History" and "Functional Status" headings. Judging by the patient's diagnosis, physical exam, and functional history, the patient has potential for functional progress which will result in measurable gains while on inpatient rehab. These gains will be of substantial and practical use upon discharge in facilitating mobility and self-care at the household level. 5. Physiatrist will provide 24 hour management of medical needs as well as oversight of the therapy plan/treatment and provide guidance as appropriate regarding the interaction of the two. 6. 24  hour rehab nursing will assist with bladder management, bowel management, safety, skin/wound care, disease management, medication administration, pain management and patient education and help integrate therapy concepts, techniques,education, etc. 7. PT will assess and treat for/with: pre gait, gait training, endurance , safety, equipment, neuromuscular re education. Goals are: Supervision. 8. OT will assess and treat for/with: ADLs, Cognitive perceptual skills, Neuromuscular re education, safety, endurance, equipment. Goals are: Supervision. Therapy may proceed with showering this patient. 9. SLP will assess and treat for/with: NA. Goals are: NA. 10. Case Management and Social Worker will assess and treat for psychological issues and discharge planning. 11. Team conference will be held weekly to assess progress toward goals and to determine barriers to discharge. 12. Patient will receive at least 3 hours of therapy per day at least 5 days per week. 13. ELOS: 7-10days  14. Prognosis: excellent  Charlett Blake M.D. [facility] FAAPM&R (Sports Med, Neuromuscular Med) Diplomate Am Board of Electrodiagnostic Med 12/19/2014

## 2014-12-19 NOTE — Progress Notes (Signed)
VASCULAR LAB PRELIMINARY  PRELIMINARY  PRELIMINARY  PRELIMINARY  Bilateral lower extremity venous duplex  completed.    Preliminary report:  Bilateral:  No evidence of DVT, superficial thrombosis, or Baker's Cyst.    Jimmi Sidener, RVT 12/19/2014, 10:56 AM

## 2014-12-19 NOTE — IPOC Note (Addendum)
Overall Plan of Care [facility]) Patient Details Name: [name] MRN: [id number] DOB: Sep 20, 1951  Admitting Diagnosis: femur fx  Hospital Problems: Active Problems:   Hip fracture     Functional Problem List: Nursing Edema, Endurance, Medication Management, Pain, Safety, Skin Integrity  PT Balance, Skin Integrity, Edema, Endurance, Motor, Pain, Safety, Sensory  OT Balance, Endurance, Pain, Safety  SLP    TR         Basic ADL's: OT Grooming, Dressing, Toileting, Bathing     Advanced  ADL's: OT Simple Meal Preparation     Transfers: PT Bed Mobility, Bed to Chair, Car, Manufacturing systems engineer, Metallurgist: PT Ambulation, Emergency planning/management officer     Additional Impairments: OT    SLP        TR      Anticipated Outcomes Item Anticipated Outcome  Self Feeding independent  Swallowing      Basic self-care  supervision  Toileting  supervision   Bathroom Transfers supervision-min  Bowel/Bladder  patient will be continent of bowel and bladder with min assist  Transfers  Mod I  Locomotion  Mod I w/c; Supervision walker  Communication     Cognition     Pain  pain less than or equal to 3 on a scale of 0-10  Safety/Judgment  patient will be free from falls/injury with min assist   Therapy Plan: PT Intensity: Minimum of 1-2 x/day ,45 to 90 minutes PT Frequency: 5 out of 7 days PT Duration Estimated Length of Stay: 14 days OT Intensity: Minimum of 1-2 x/day, 45 to 90 minutes OT Frequency: 5 out of 7 days OT Duration/Estimated Length of Stay: 12-14 days         Team Interventions: Nursing Interventions Pain Management, Medication Management, Skin Care/Wound Management, Discharge Planning, Patient/Family Education  PT interventions Ambulation/gait training, Discharge planning, Functional mobility training, Psychosocial support, Therapeutic Activities, Wheelchair propulsion/positioning, Therapeutic Exercise, Skin care/wound management, Neuromuscular  re-education, Disease management/prevention, Training and development officer, DME/adaptive equipment instruction, Pain management, UE/LE Strength taining/ROM, UE/LE Coordination activities, Patient/family education, Community reintegration  OT Interventions Training and development officer, Discharge planning, DME/adaptive equipment instruction, Self Care/advanced ADL retraining, Therapeutic Exercise, UE/LE Strength taining/ROM, Pain management, Patient/family education, UE/LE Coordination activities, Therapeutic Activities, Functional mobility training, Community reintegration  SLP Interventions    TR Interventions    SW/CM Interventions Discharge Planning, Barrister's clerk, Patient/Family Education    Team Discharge Planning: Destination: PT-Home ,OT- Home , SLP-  Projected Follow-up: PT-Home health PT, Other (comment) (intermittent supervision), OT-  Home health OT, SLP-  Projected Equipment Needs: PT-To be determined, Wheelchair (measurements), Wheelchair cushion (measurements), Rolling walker with 5" wheels, OT- 3 in 1 bedside comode, Tub/shower bench, Rolling walker with 5" wheels, Wheelchair (measurements), Wheelchair cushion (measurements), SLP-  Equipment Details: PT- , OT-  Patient/family involved in discharge planning: PT- Patient, Family Midwife,  OT-Patient, Family member/caregiver, SLP-   MD ELOS: 7-10d Medical Rehab Prognosis:  Excellent Assessment: 63 y.o. right handed female admitted 12/15/2014 after a mechanical fall in the parking lot without loss of consciousness. She lives with her spouse and was independent prior to admission. Patient with immediate left hip pain. X-rays and imaging revealed a left comminuted displaced peritrochanteric femur fracture. Underwent ORIF with Biomet intramedullary nail 12/16/2014 per Dr. Veverly Fells. Nonweightbearing left lower extremity. Hospital course pain management. Subcutaneous Lovenox for DVT prophylaxis. Urine study ESBL UTI placed on Primaxin  and completed/contact precautions  Now requiring 24/7 Rehab RN,MD, as well as CIR level PT, OT and SLP.  Treatment team will focus on ADLs and mobility with goals set at Sup/Mod I   See Team Conference Notes for weekly updates to the plan of care

## 2014-12-20 ENCOUNTER — Inpatient Hospital Stay (HOSPITAL_COMMUNITY): Payer: 59 | Admitting: Occupational Therapy

## 2014-12-20 ENCOUNTER — Inpatient Hospital Stay (HOSPITAL_COMMUNITY): Payer: 59 | Admitting: Physical Therapy

## 2014-12-20 DIAGNOSIS — S72001E Fracture of unspecified part of neck of right femur, subsequent encounter for open fracture type I or II with routine healing: Secondary | ICD-10-CM

## 2014-12-20 MED ORDER — GUAIFENESIN ER 600 MG PO TB12
600.0000 mg | ORAL_TABLET | Freq: Two times a day (BID) | ORAL | Status: DC | PRN
  Administered 2014-12-20: 600 mg via ORAL
  Filled 2014-12-20 (×4): qty 1

## 2014-12-20 MED ORDER — SORBITOL 70 % SOLN
30.0000 mL | Freq: Every day | Status: DC | PRN
  Administered 2014-12-20: 30 mL via ORAL
  Filled 2014-12-20: qty 30

## 2014-12-20 NOTE — Progress Notes (Signed)
Occupational Therapy Session Note  Patient Details  Name: Aadhya Bustamante MRN: [id number] Date of Birth: [birth year]  Today's Date: 12/20/2014 OT Individual Time: 1000-1102 OT Individual Time Calculation (min): 62 min    Short Term Goals: Week 1:  OT Short Term Goal 1 (Week 1): Pt will perform LB bathing with AE PRN and min assist. OT Short Term Goal 2 (Week 1): Pt will perform LB dressing with AE and min assist sit to stand. OT Short Term Goal 3 (Week 1): Pt will perform toilet transfers 3/4 attempts with min assist. OT Short Term Goal 4 (Week 1): Pt will perform shower/tub transfers simulated in dry environement with min assist.  Skilled Therapeutic Interventions/Progress Updates:   Pt was taken down to the therapy gym to begin session.  She was able to transfer stand pivot from the wheelchair with mod assist and mod demonstrational cueing to the therapy mat.  Still noted some slight weightbearing through the LLE with stand to sit transitions.  Educated pt on use of reacher and sockaide and let her return demonstrate with mod instructional cueing.  Transferred back to the wheelchair from the [facility] with mod demonstrational cueing for technique and hand position as pt wants to pull up on the walker with one hand.  Educated pt on transfers from wheelchair to 3:1 and let her practice in the apartment.  Mod assist needed for transfer from wheelchair with min assist for transfer back, from the elevated BSC.  Pt was shown tub bench during session but did not practice secondary to fatigue and time.   Therapy Documentation Precautions:  Precautions Precautions: Fall Restrictions Weight Bearing Restrictions: Yes LUE Weight Bearing: Weight bearing as tolerated LLE Weight Bearing: Non weight bearing  Pain: Pain Assessment Pain Assessment: No/denies pain Pain Score: 3  Faces Pain Scale: Hurts a little bit Pain Type: Surgical pain Pain Location: Hip Pain Orientation: Left Pain Descriptors /  Indicators: Aching Pain Onset: On-going Pain Intervention(s): Repositioned Multiple Pain Sites: No ADL: See FIM for current functional status  Therapy/Group: Individual Therapy  Javier Gell OTR/L 12/20/2014, 11:50 AM

## 2014-12-20 NOTE — Evaluation (Signed)
Occupational Therapy Assessment and Plan  Patient Details  Name: [name] MRN: [id number] Date of Birth: August 16, 1951  OT Diagnosis: abnormal posture, acute pain, muscle weakness (generalized) and pain in joint Rehab Potential: Rehab Potential (ACUTE ONLY): Good ELOS: 12-14 days   Today's Date: 12/20/2014 OT Individual Time: 0800-0900 OT Individual Time Calculation (min): 60 min     Problem List:  Patient Active Problem List   Diagnosis Date Noted  . Hip fracture 12/18/2014  . Fall   . Subtrochanteric fracture of femur 12/16/2014  . UTI (urinary tract infection) 12/16/2014  . Leukocytosis 12/16/2014  . Hip fracture requiring operative repair   . Closed left hip fracture 12/15/2014  . Lesion of right humerus 12/15/2014  . Osteoporosis, unspecified 10/15/2013  . HTN (hypertension) 07/16/2013  . Hypothyroidism 07/16/2013  . Osteopenia 07/16/2013  . Environmental allergies 07/16/2013  . Obesity (BMI 30-39.9) 07/16/2013    Past Medical History:  Past Medical History  Diagnosis Date  . 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 Chickenpox   . Genital warts   . Environmental allergies   . Heart murmur   . HTN (hypertension)   . Hyperlipidemia   . Thyroid disease     Hypothyroid  . Thyroid nodule    Past Surgical History:  Past Surgical History  Procedure Laterality Date  . Carpal tunnel release      Both wrist  . Cystectomy      From the cervix  . Intramedullary (im) nail intertrochanteric Left 12/15/2014    Procedure: INTRAMEDULLARY (IM) NAIL INTERTROCHANTRIC;  Surgeon: Augustin Schooling, MD;  Location: [facility];  Service: Orthopedics;  Laterality: Left;    Assessment & Plan Clinical Impression: Patient is a 63 y.o. year old female with recent admission to the hospital on 12/15/2014 after a mechanical fall in the parking lot without loss of consciousness. X-rays and imaging revealed a left comminuted displaced peritrochanteric femur fracture. Underwent  ORIF with Biomet intramedullary nail 12/16/2014 per Dr. Veverly Fells. Nonweightbearing left lower extremity.  Patient transferred to CIR on 12/18/2014 .    Patient currently requires max with basic self-care skills secondary to muscle weakness and muscle joint tightness and decreased standing balance, decreased postural control, decreased balance strategies and difficulty maintaining precautions.  Prior to hospitalization, patient could complete ADLs with modified independent .  Patient will benefit from skilled intervention to decrease level of assist with basic self-care skills, increase independence with basic self-care skills and increase level of independence with iADL prior to discharge home with care partner.  Anticipate patient will require 24 hour supervision and follow up home health.  OT - End of Session Activity Tolerance: Tolerates 30+ min activity with multiple rests Endurance Deficit: Yes Endurance Deficit Description: Pt fatigues quickly with sit to stand or transfers requiring rest breaks. OT Assessment Rehab Potential (ACUTE ONLY): Good OT Patient demonstrates impairments in the following area(s): Balance;Endurance;Pain;Safety OT Basic ADL's Functional Problem(s): Grooming;Dressing;Toileting;Bathing OT Advanced ADL's Functional Problem(s): Simple Meal Preparation OT Transfers Functional Problem(s): Toilet;Tub/Shower OT Plan OT Intensity: Minimum of 1-2 x/day, 45 to 90 minutes OT Frequency: 5 out of 7 days OT Duration/Estimated Length of Stay: 12-14 days OT Treatment/Interventions: Balance/vestibular training;Discharge planning;DME/adaptive equipment instruction;Self Care/advanced ADL retraining;Therapeutic Exercise;UE/LE Strength taining/ROM;Pain management;Patient/family education;UE/LE Coordination activities;Therapeutic Activities;Functional mobility training;Community reintegration OT Self Feeding Anticipated Outcome(s): independent OT Basic Self-Care Anticipated Outcome(s):  supervision OT Toileting Anticipated Outcome(s): supervision OT Bathroom Transfers Anticipated Outcome(s): supervision-min OT Recommendation Patient destination: Home Follow Up Recommendations: Home  health OT Equipment Recommended: 3 in 1 bedside comode;Tub/shower bench;Rolling walker with 5" wheels;Wheelchair (measurements);Wheelchair cushion (measurements)  OT Evaluation Precautions/Restrictions  Precautions Precautions: Fall Restrictions Weight Bearing Restrictions: Yes LUE Weight Bearing: Weight bearing as tolerated LLE Weight Bearing: Non weight bearing  Pain Pain Assessment Pain Assessment: No/denies pain Pain Score: 3  Pain Type: Surgical pain Pain Location: Hip Pain Orientation: Left Pain Descriptors / Indicators: Aching Pain Onset: On-going Pain Intervention(s): Rest Multiple Pain Sites: No Home Living/Prior Functioning Home Living Available Help at Discharge: Family Type of Home: House Home Access: Stairs to enter Technical brewer of Steps: 2 from back entrance; 3 from garage Entrance Stairs-Rails: Left Home Layout: One level Additional Comments: Husband plans on measuring bathroom entrance and favorite chair seat to floor height  Lives With: Spouse IADL History Current License: Yes Occupation: Full time employment Prior Function Level of Independence: Independent with basic ADLs  Able to Take Stairs?: No Driving: Yes Vocation: Full time employment 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 Comments: intermittent cane use when 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 acting up ADL  See FIM scale for details  Vision/Perception  Vision- History Baseline Vision/History: Wears glasses Wears Glasses: At all times Patient Visual Report: No change from baseline Vision- Assessment Vision Assessment?: No apparent visual deficits Perception Comments: wfl  Cognition Overall Cognitive Status: Within Functional Limits for tasks assessed Orientation Level: Oriented  X4 Attention: Focused;Sustained;Alternating Focused Attention: Appears intact Sustained Attention: Appears intact Alternating Attention: Appears intact Memory: Appears intact Awareness: Appears intact Problem Solving: Appears intact Safety/Judgment: Appears intact Comments: Pt with slight anxiety with functional mobility, LB selfcare sit to stand, and functional transfers. Sensation Sensation Light Touch: Appears Intact Stereognosis: Appears Intact Hot/Cold: Appears Intact Proprioception: Appears Intact Coordination Gross Motor Movements are Fluid and Coordinated: Yes (UEs are WFLs) Fine Motor Movements are Fluid and Coordinated: Yes Coordination and Movement Description: antalgic and compensated movements for L LE NWB precaution Motor  Motor Motor: Abnormal postural alignment and control Motor - Skilled Clinical Observations: forward head - pt reports intermittent numbness in 3rd digit premorbidly Mobility  Bed Mobility Bed Mobility: Not assessed Transfers Transfers: Sit to Stand Sit to Stand: 3: Mod assist;With upper extremity assist;From bed;From toilet Sit to Stand Details: Verbal cues for technique;Verbal cues for precautions/safety;Manual facilitation for placement;Manual facilitation for weight shifting Stand to Sit: 4: Min assist;With upper extremity assist;To chair/3-in-1 Stand to Sit Details (indicate cue type and reason): Verbal cues for technique;Verbal cues for precautions/safety;Manual facilitation for weight shifting  Trunk/Postural Assessment  Cervical Assessment Cervical Assessment: Within Functional Limits Thoracic Assessment Thoracic Assessment: Within Functional Limits Lumbar Assessment Lumbar Assessment: Within Functional Limits Postural Control Postural Control: Deficits on evaluation Postural Limitations: Pt maintains trunk flexion in standing.  Balance Balance Balance Assessed: Yes Static Standing Balance Static Standing - Balance Support: During  functional activity Static Standing - Level of Assistance: 3: Mod assist Dynamic Standing Balance Dynamic Standing - Balance Support: Bilateral upper extremity supported Dynamic Standing - Level of Assistance: 3: Mod assist Extremity/Trunk Assessment RUE Assessment RUE Assessment: Within Functional Limits (strength grossly 4/5 throughout) LUE Assessment LUE Assessment:  (strength grossly 4/5 throughout)  FIM:  FIM - Grooming Grooming Steps: Wash, rinse, dry face;Wash, rinse, dry hands;Oral care, brush teeth, clean dentures;Brush, comb hair Grooming: 5: Supervision: safety issues or verbal cues FIM - Bathing Bathing Steps Patient Completed: Chest;Right Arm;Left Arm;Abdomen;Right upper leg;Left upper leg Bathing: 3: Mod-Patient completes 5-7 30f 10 parts or 50-74% FIM - Upper Body Dressing/Undressing Upper body dressing/undressing  steps patient completed: Thread/unthread right sleeve of pullover shirt/dresss;Thread/unthread left sleeve of pullover shirt/dress;Put head through opening of pull over shirt/dress;Pull shirt over trunk Upper body dressing/undressing: 5: Supervision: Safety issues/verbal cues FIM - Lower Body Dressing/Undressing Lower body dressing/undressing steps patient completed: Thread/unthread left underwear leg;Thread/unthread right pants leg;Thread/unthread left pants leg Lower body dressing/undressing: 2: Max-Patient completed 25-49% of tasks FIM - Toileting Toileting: 2: Max-Patient completed 1 of 3 steps FIM - Bed/Chair Transfer Bed/Chair Transfer: 3: Supine > Sit: Mod A (lifting assist/Pt. 50-74%/lift 2 legs;3: Bed > Chair or W/C: Mod A (lift or lower assist) FIM - Radio producer Devices: Bedside commode;Walker Toilet Transfers: 3-To toilet/BSC: Mod A (lift or lower assist);3-From toilet/BSC: Mod A (lift or lower assist) FIM - Tub/Shower Transfers Tub/shower Transfers: 0-Activity did not occur or was simulated   Refer to Care Plan for  Long Term Goals  Recommendations for other services: None  Discharge Criteria: Patient will be discharged from OT if patient refuses treatment 3 consecutive times without medical reason, if treatment goals not met, if there is a change in medical status, if patient makes no progress towards goals or if patient is discharged from hospital.  The above assessment, treatment plan, treatment alternatives and goals were discussed and mutually agreed upon: by patient and by family   Began education on safety and techniques for safe functional transfers from bed to wheelchair, including maintaining Perry status.  Pt with slight increased anxiety with standing and decreased ability to maintain NWBing over the LLE with sit to stand transitions.  Discussed home recommendations with husband as related to DME for toilet and shower and possible need for wheelchair ramp, depending on progress.  Pt with decreased ability to reach the LLE for selfcare tasks at this time.  Will benefit from AE to assist with this.   Esaiah Wanless OTR/L 12/20/2014, 11:34 AM

## 2014-12-20 NOTE — Evaluation (Signed)
Physical Therapy Assessment and Plan  Patient Details  Name: [name] MRN: [id number] Date of Birth: 08/01/1951  PT Diagnosis: Abnormal posture, Difficulty walking, Dizziness and giddiness, Edema, Impaired sensation, Muscle weakness and Pain in L leg Rehab Potential:   ELOS:     Today's Date: 12/20/2014 PT Individual Time: 0900-1000 PT Individual Time Calculation (min): 60 min    Problem List:  Patient Active Problem List   Diagnosis Date Noted  . Hip fracture 12/18/2014  . Fall   . Subtrochanteric fracture of femur 12/16/2014  . UTI (urinary tract infection) 12/16/2014  . Leukocytosis 12/16/2014  . Hip fracture requiring operative repair   . Closed left hip fracture 12/15/2014  . Lesion of right humerus 12/15/2014  . Osteoporosis, unspecified 10/15/2013  . HTN (hypertension) 07/16/2013  . Hypothyroidism 07/16/2013  . Osteopenia 07/16/2013  . Environmental allergies 07/16/2013  . Obesity (BMI 30-39.9) 07/16/2013    Past Medical History:  Past Medical History  Diagnosis Date  . 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 Chickenpox   . Genital warts   . Environmental allergies   . Heart murmur   . HTN (hypertension)   . Hyperlipidemia   . Thyroid disease     Hypothyroid  . Thyroid nodule    Past Surgical History:  Past Surgical History  Procedure Laterality Date  . Carpal tunnel release      Both wrist  . Cystectomy      From the cervix  . Intramedullary (im) nail intertrochanteric Left 12/15/2014    Procedure: INTRAMEDULLARY (IM) NAIL INTERTROCHANTRIC;  Surgeon: Augustin Schooling, MD;  Location: [facility];  Service: Orthopedics;  Laterality: Left;    Assessment & Plan Clinical Impression: [name] is a 63 y.o. right handed female admitted 12/15/2014 after a mechanical fall in the parking lot without loss of consciousness. She lives with her spouse and was independent prior to admission. Patient with immediate left hip pain. X-rays and imaging  revealed a left comminuted displaced peritrochanteric femur fracture. Underwent ORIF with Biomet intramedullary nail 12/16/2014 per Dr. Veverly Fells. Nonweightbearing left lower extremity. Hospital course pain management. Subcutaneous Lovenox for DVT prophylaxis. Urine study ESBL UTI placed on Primaxin and completed/contact precautions. Acute blood loss anemia 9.0. Physical therapy evaluation completed 12/17/2014 with recommendations of physical medicine rehabilitation consult.  Patient transferred to CIR on 12/18/2014 .   Patient currently requires min with mobility secondary to muscle weakness, decreased cardiorespiratoy endurance and decreased sitting balance, decreased standing balance and difficulty maintaining precautions.  Prior to hospitalization, patient was modified independent  with mobility and lived with Spouse in a House home.  Home access is 2 from back entrance; 3 from garageStairs to enter.  Patient will benefit from skilled PT intervention to maximize safe functional mobility, minimize fall risk and decrease caregiver burden for planned discharge home with intermittent assist.  Anticipate patient will benefit from follow up [facility] at discharge.  PT - End of Session Endurance Deficit: Yes Endurance Deficit Description: Pt fatigues quickly with sit to stand or transfers requiring rest breaks.  Skilled Therapeutic Intervention PT Evaluation: PT Evaluation initiated and PT notes that pt has high levels of pain in L hip and premorbid deconditioning, limiting current function. 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 Pt has a supportive husband who already plans on getting a temporary ramp installed to ease pt's  entrance/exit from home. Currently, L hip flexion very limited by pain, but knee and ankle ROM good. B UEs are strong, but R LE is limited, grossly 4/5. Pt c/o "wooziness" with w/c propulsion and  functional mobility, but vitals are all stable - likely induced by anxiety re: this injury/rehab.   Gait Training: PT instructs pt in hopping with RW x 6' req min A for balance and verbal cues to maintain L LE NWB precaution.   Therapeutic Exercise:  PT instructs pt in L LE A/AROM exercises: seated march, LAQ/seated knee flexion, ankle pumps x 10 reps each.   Pt will benefit from IPR PT to maximize safe functional mobility and prepare for d/c home with husband assist.    PT Evaluation Precautions/Restrictions Precautions Precautions: Fall Restrictions Weight Bearing Restrictions: Yes LUE Weight Bearing: Weight bearing as tolerated LLE Weight Bearing: Non weight bearing General Chart Reviewed: Yes Family/Caregiver Present: Yes (husband [name]) Vital SignsTherapy Vitals Pulse Rate: 83 BP: (!) 141/59 mmHg Patient Position (if appropriate): Sitting Oxygen Therapy SpO2: 97 % O2 Device: Not Delivered Pain Pain Assessment Pain Assessment: No/denies pain Pain Score: 3  Faces Pain Scale: Hurts a little bit Pain Type: Surgical pain Pain Location: Hip Pain Orientation: Left Pain Descriptors / Indicators: Aching Pain Onset: On-going Pain Intervention(s): Repositioned Multiple Pain Sites: No Home Living/Prior Functioning Home Living Available Help at Discharge: Family Type of Home: House Home Access: Stairs to enter Technical brewer of Steps: 2 from back entrance; 3 from garage Entrance Stairs-Rails: Left Home Layout: One level Additional Comments: Husband plans on measuring bathroom entrance and favorite chair seat to floor height  Lives With: Spouse Prior Function Level of Independence: Independent with basic ADLs  Able to Take Stairs?: No Driving: Yes Vocation: Full time employment 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 Sensation Sensation Light Touch: Appears Intact Stereognosis: Appears Intact Hot/Cold: Appears Intact Proprioception: Appears Intact Coordination Gross Motor Movements are Fluid and Coordinated: Yes (UEs are WFLs) Fine Motor Movements are Fluid and Coordinated: Yes Coordination and Movement Description: antalgic and compensated movements for L LE NWB precaution Motor  Motor Motor: Abnormal postural alignment and control Motor - Skilled Clinical Observations: forward head - pt reports intermittent numbness in 3rd digit premorbidly  Mobility Bed Mobility Bed Mobility: Not assessed Transfers Transfers: Yes Sit to Stand: 3: Mod assist;With upper extremity assist;From bed;From toilet Sit to Stand Details: Verbal cues for technique;Verbal cues for precautions/safety;Manual facilitation for placement;Manual facilitation for weight shifting Stand to Sit: 4: Min assist;With upper extremity assist;To chair/3-in-1 Stand to Sit Details (indicate cue type and reason): Verbal cues for technique;Verbal cues for precautions/safety;Manual facilitation for weight shifting Stand Pivot Transfers: 4: Min assist Stand Pivot Transfer Details: Manual facilitation for placement;Verbal cues for technique Stand Pivot Transfer Details (indicate cue type and reason): hand placement Locomotion  Ambulation Ambulation: Yes Ambulation/Gait Assistance: 4: Min assist Ambulation Distance (Feet): 6 Feet Assistive device: Rolling walker Ambulation/Gait Assistance  Details: Manual facilitation for placement;Verbal cues for precautions/safety Ambulation/Gait Assistance Details: hopping with RW Gait Gait: Yes Gait Pattern: Impaired Gait  Pattern:  (roll hope with RW) Gait velocity: slowed Stairs / Additional Locomotion Stairs: No Architect: Yes Wheelchair Assistance: 5: Investment banker, operational Details: Verbal cues for Marketing executive: Both upper extremities Wheelchair Parts Management: Needs assistance Distance: 65  Trunk/Postural Assessment  Cervical Assessment Cervical Assessment: Within Functional Limits Thoracic Assessment Thoracic Assessment: Within Functional Limits Lumbar Assessment Lumbar Assessment: Within Functional Limits Postural Control Postural Control: Deficits on evaluation Postural Limitations: Pt maintains trunk flexion in standing.  Balance Balance Balance Assessed: Yes Dynamic Sitting Balance Sitting balance - Comments: SBA Static Standing Balance Static Standing - Balance Support: Bilateral upper extremity supported;During functional activity Static Standing - Level of Assistance: 4: Min assist Dynamic Standing Balance Dynamic Standing - Balance Support: Bilateral upper extremity supported;During functional activity Dynamic Standing - Level of Assistance: 4: Min assist Extremity Assessment  RUE Assessment RUE Assessment: Within Functional Limits LUE Assessment LUE Assessment: Within Functional Limits RLE Assessment RLE Assessment: Exceptions to [facility] RLE Strength RLE Overall Strength: Deficits;Due to premorbid status RLE Overall Strength Comments: grossly 4/5 LLE Assessment LLE Assessment: Exceptions to WFL LLE AROM (degrees) Overall AROM Left Lower Extremity: Deficits;Due to pain LLE Overall AROM Comments: knee and ankle WFL; hip flexion minimal LLE PROM (degrees) Overall PROM Left Lower Extremity: Deficits;Due to pain LLE Overall PROM Comments: hip  flexio to 70 degrees passively LLE Strength LLE Overall Strength: Deficits;Due to pain LLE Overall Strength Comments: hip flexion 1/5, knee grossly >= 3/5, ankle >= 3/5  FIM:  FIM - Bed/Chair Transfer Bed/Chair Transfer: 3: Supine > Sit: Mod A (lifting assist/Pt. 50-74%/lift 2 legs;3: Bed > Chair or W/C: Mod A (lift or lower assist) FIM - Locomotion: Wheelchair Distance: 65 FIM - Locomotion: Ambulation Ambulation/Gait Assistance: 4: Min assist   Refer to Care Plan for Long Term Goals  Recommendations for other services: None  Discharge Criteria: Patient will be discharged from PT if patient refuses treatment 3 consecutive times without medical reason, if treatment goals not met, if there is a change in medical status, if patient makes no progress towards goals or if patient is discharged from hospital.  The above assessment, treatment plan, treatment alternatives and goals were discussed and mutually agreed upon: by patient and by family  [facility] M 12/20/2014, 1:00 PM

## 2014-12-20 NOTE — Progress Notes (Signed)
Patient ID: [name], female   DOB: 19-Jul-1951, 63 y.o.   MRN: [id number]   12/20/14.  HPI: [name] is a 63 y.o. right handed female admitted 12/15/2014 after a mechanical fall in the parking lot without loss of consciousness.  X-rays and imaging revealed a left comminuted displaced peritrochanteric femur fracture. Underwent ORIF with Biomet intramedullary nail 12/16/2014 per Dr. Veverly Fells. Nonweightbearing left lower extremity. Hospital course pain management. Subcutaneous Lovenox for DVT prophylaxis. Urine study ESBL UTI placed on Primaxin and completed/contact precautions. Acute blood loss anemia 9.0. Physical therapy evaluation completed 12/17/2014 with recommendations of physical medicine rehabilitation consult. Patient was admitted for comprehensive rehabilitation program on 12/19/14.  Comfortable night.  Pain reasonably well controlled  ROS Review of Systems  Gastrointestinal: Positive for constipation.  Musculoskeletal: Positive for myalgias.  All other systems reviewed and are negative    Past Medical History  Diagnosis Date  . 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 Chickenpox   . Genital warts   . Environmental allergies   . Heart murmur   . HTN (hypertension)   . Hyperlipidemia   . Thyroid disease     Hypothyroid  . Thyroid nodule    Past Surgical History  Procedure Laterality Date  . Carpal tunnel release      Both wrist  . Cystectomy      From the cervix  . Intramedullary (im) nail intertrochanteric Left 12/15/2014    Procedure: INTRAMEDULLARY (IM) NAIL INTERTROCHANTRIC; Surgeon: Augustin Schooling, MD; Location: [facility]; Service: Orthopedics; Laterality: Left;   Family History  Problem Relation Age of Onset  . Arthritis Mother   . Hyperlipidemia Mother   . Heart disease Father    . Stroke Father     x's 2  . High blood pressure Father   . High blood pressure Mother   . Diabetes Mother   . Myasthenia gravis Mother    Social History: reports that she has never smoked. She has never used smokeless tobacco. She reports that she drinks alcohol. She reports that she does not use illicit drugs. Allergies:  Allergies  Allergen Reactions  . Statins Diarrhea  . Eggs Or Egg-Derived Products Diarrhea and Rash   Medications Prior to Admission  Medication Sig Dispense Refill  . azelastine (ASTELIN) 0.1 % nasal spray Place 1 spray into both nostrils 2 (two) times daily. Use in each nostril as directed 90 mL 3  . fluticasone (FLONASE) 50 MCG/ACT nasal spray Place 2 sprays into both nostrils daily. 48 g 3  . ibuprofen (ADVIL,MOTRIN) 200 MG tablet Take 200-800 mg by mouth every 6 (six) hours as needed for moderate pain.    Marland Kitchen levothyroxine (SYNTHROID) 88 MCG tablet Take 1 tablet (88 mcg total) by mouth daily. 90 tablet 3  . valsartan (DIOVAN) 320 MG tablet TAKE 1 TABLET DAILY 90 tablet 3  . Vitamin D, Ergocalciferol, (DRISDOL) 50000 UNITS CAPS capsule Take 50,000 Units by mouth 3 (three) times a week.    . Cholecalciferol (VITAMIN D3 PO) Take 1 capsule by mouth 3 (three) times a week.      Patient Vitals for the past 24 hrs:  BP Temp Temp src Pulse Resp SpO2  12/20/14 0652 (!) 144/69 mmHg 98.1 F (36.7 C) Oral 81 18 95 %     Intake/Output Summary (Last 24 hours) at 12/20/14 0839 Last data filed at 12/19/14 1800  Gross per 24 hour  Intake    300 ml  Output  300 ml  Net      0 ml     Physical Exam: Blood pressure 136/53, pulse 100, temperature 99.1 F (37.3 C), temperature source Oral, resp. rate 16, height $RemoveBe'5\' 5"'uoguQfyxy$  (1.651 m), weight 87.544 kg (193 lb), SpO2 92 %. Physical Exam Constitutional: She is oriented to person,  place, and time. She appears well-developed.  HENT:  Head: Normocephalic.  Eyes: EOM are normal.  Neck: Normal range of motion. Neck supple. No thyromegaly present.  Cardiovascular: Normal rate and regular rhythm.  Respiratory: Effort normal and breath sounds normal. No respiratory distress.  GI: Soft. Bowel sounds are normal. She exhibits no distension.  Neurological: She is alert and oriented to person, place, and time.  Skin:  Left hip incision is dressed appropriately tender , No erythema but serous drainage upper hip incision continues-requires frequent dressing changes Motor strength is 5/5 bilateral deltoids, biceps, triceps, grip, 4 minus right hip flexor and knee extensor and ankle dorsal flexor, 2 minus left hip flexor 3 minus knee extensor 4 minus ankle dorsal flexor on the left    Lab Results Last 48 Hours    Results for orders placed or performed during the hospital encounter of 12/15/14 (from the past 48 hour(s))  CBC Status: Abnormal   Collection Time: 12/17/14 5:00 AM  Result Value Ref Range   WBC 10.2 4.0 - 10.5 K/uL   RBC 3.16 (L) 3.87 - 5.11 MIL/uL   Hemoglobin 9.0 (L) 12.0 - 15.0 g/dL    Comment: DELTA CHECK NOTED REPEATED TO VERIFY    HCT 28.3 (L) 36.0 - 46.0 %   MCV 89.6 78.0 - 100.0 fL   MCH 28.5 26.0 - 34.0 pg   MCHC 31.8 30.0 - 36.0 g/dL   RDW 13.6 11.5 - 15.5 %   Platelets 211 150 - 400 K/uL    Comment: DELTA CHECK NOTED REPEATED TO VERIFY   Basic metabolic panel Status: Abnormal   Collection Time: 12/17/14 5:00 AM  Result Value Ref Range   Sodium 133 (L) 135 - 145 mmol/L    Comment: Please note change in reference range.   Potassium 4.0 3.5 - 5.1 mmol/L    Comment: Please note change in reference range.   Chloride 104 96 - 112 mEq/L   CO2 26 19 - 32 mmol/L    Glucose, Bld 157 (H) 70 - 99 mg/dL   BUN 16 6 - 23 mg/dL   Creatinine, Ser 0.74 0.50 - 1.10 mg/dL   Calcium 8.1 (L) 8.4 - 10.5 mg/dL   GFR calc non Af Amer 89 (L) >90 mL/min   GFR calc Af Amer >90 >90 mL/min    Comment: (NOTE) The eGFR has been calculated using the CKD EPI equation. This calculation has not been validated in all clinical situations. eGFR's persistently <90 mL/min signify possible Chronic Kidney Disease.    Anion gap 3 (L) 5 - 15  Basic metabolic panel Status: Abnormal   Collection Time: 12/18/14 4:43 AM  Result Value Ref Range   Sodium 136 135 - 145 mmol/L    Comment: Please note change in reference range.   Potassium 4.0 3.5 - 5.1 mmol/L    Comment: Please note change in reference range.   Chloride 106 96 - 112 mEq/L   CO2 26 19 - 32 mmol/L   Glucose, Bld 139 (H) 70 - 99 mg/dL   BUN 11 6 - 23 mg/dL   Creatinine, Ser 0.67 0.50 - 1.10 mg/dL   Calcium 8.4 8.4 -  10.5 mg/dL   GFR calc non Af Amer >90 >90 mL/min   GFR calc Af Amer >90 >90 mL/min    Comment: (NOTE) The eGFR has been calculated using the CKD EPI equation. This calculation has not been validated in all clinical situations. eGFR's persistently <90 mL/min signify possible Chronic Kidney Disease.    Anion gap 4 (L) 5 - 15  CBC Status: Abnormal   Collection Time: 12/18/14 4:43 AM  Result Value Ref Range   WBC 12.9 (H) 4.0 - 10.5 K/uL   RBC 3.28 (L) 3.87 - 5.11 MIL/uL   Hemoglobin 9.4 (L) 12.0 - 15.0 g/dL   HCT 29.0 (L) 36.0 - 46.0 %   MCV 88.4 78.0 - 100.0 fL   MCH 28.7 26.0 - 34.0 pg   MCHC 32.4 30.0 - 36.0 g/dL   RDW 13.3 11.5 - 15.5 %   Platelets 238 150 - 400 K/uL      Imaging Results (Last 48 hours)    No  results found.       Medical Problem List and Plan: 1. Functional deficits secondary to left comminuted displaced peritrochanteric femur fracture. Status post ORIF. Nonweightbearing left lower extremity 2. DVT Prophylaxis/Anticoagulation: SQ Lovenox. Monitor for any bleeding episodes. Check vascular study 3. Pain Management: Hydrocodone and Robaxin as needed. Monitor with increased mobility on 4. Acute blood loss anemia. Follow-up CBC 5. Neuropsych: This patient is capable of making decisions on her own behalf. 6. Skin/Wound Care: Routine skin checks of surgical site 7. Fluids/Electrolytes/Nutrition: Strict I and O follow-up chemistries 8.ESBL?UTI.Primaxin completed/contact precaution 9. Hypertension. Avapro 300 mg daily. Monitor closely with increased mobility 10. Hypothyroidism. Synthroid

## 2014-12-21 ENCOUNTER — Encounter (HOSPITAL_COMMUNITY): Payer: 59

## 2014-12-21 ENCOUNTER — Inpatient Hospital Stay (HOSPITAL_COMMUNITY): Payer: 59 | Admitting: Occupational Therapy

## 2014-12-21 ENCOUNTER — Inpatient Hospital Stay (HOSPITAL_COMMUNITY): Payer: 59 | Admitting: Physical Therapy

## 2014-12-21 DIAGNOSIS — S72001D Fracture of unspecified part of neck of right femur, subsequent encounter for closed fracture with routine healing: Secondary | ICD-10-CM

## 2014-12-21 NOTE — Progress Notes (Signed)
Physical Therapy Session Note  Patient Details  Name: [name] MRN: [id number] Date of Birth: [birth year]  Today's Date: 12/21/2014 PT Individual Time: 1435-1535 PT Individual Time Calculation (min): 60 min   Short Term Goals: Week 1:  PT Short Term Goal 1 (Week 1): Pt will initiate bed mobility with PT.  PT Short Term Goal 2 (Week 1): Pt will demonstrate sit to stand with CGA.  PT Short Term Goal 3 (Week 1): Pt will transfer w/c to/from bed with CGA and RW.  PT Short Term Goal 4 (Week 1): Pt will ambulate x 15' with RW req min A.  PT Short Term Goal 5 (Week 1): Pt will self propel manual w/c x 100' req SBA and verbal cues.   Therapy Documentation   Pain: 6/10 initially left hip, 3/10 left hip after therapy. Locomotion : Office manager: 150 feet (Increased time to accomplish)    Precautions:  NWB LLE   Seated there ex: Left and right ankle pumps 3x10 Left and right hip flexion 3x10 active assist ROM for hip flexion Left and right long arc quads 3x10 Bilateral elbow flexion with 5 pound wand 30x Bilateral chest press with 5 pound wand 30x Bilateral shoulder flexion with 5 pound wand 30x  Sit to and from stand with rolling walker min assist NWB LLE.  Patient stood approximately 7 minutes NWB LLE with RW verbal cues to maintain NWB LLE and contact guard assist while engaging in checkers and reaching out of base of support and maintaining NWB LLE with verbal cues.  Patient ambulated 10 feetx2 with RW min assist with a hop to pattern. Patient educated on proper sequence and technique. NWB LLE verbal cues to maintain NWB LLE.  Patient propelled wheelchair 150 feet with bilateral upper extremities and close supervision. Verbal cues for environmental awareness and obstacle negotiation as well as turns. Increased time required to accomplish task.    NWB LLE maintained throughout session. Vitals monitored and remained stable throughout session responding  appropriately to activity. Patient tolerated session with rest breaks throughout.  Spouse present during session for observation and education provided on rehab, goals, plan of care, treatment, and durable medical equipment. Patient left in room with spouse present and all needs met.     Therapy/Group: Individual Therapy  Retta Diones 12/21/2014, 3:41 PM

## 2014-12-21 NOTE — Progress Notes (Signed)
Occupational Therapy Session Note  Patient Details  Name: [name] MRN: [id number] Date of Birth: [birth year]  Today's Date: 12/21/2014 OT Individual Time: 1300-1330 OT Individual Time Calculation (min): 30 min   Skilled Therapeutic Interventions/Progress Updates: Attempted shower transfer via walker with patient but though she had just taken pain meds approximately 30 min prior to session, she stated her pain level increased from 8/10 to 9/10; so, her therapies were limited to sit to stand and practicing making left and right turns in her w/c.  She was able to tolerate sit to stand x 3 and was educated on kicking out her left leg to assist with NWB as otherwise, she appeared to bear weight through the L LE. Nursing was contacted regarding patient c/o that pain level had increased - even after given meds.       Therapy Documentation Precautions:  Precautions Precautions: Fall Restrictions Weight Bearing Restrictions: Yes LUE Weight Bearing: Non weight bearing LLE Weight Bearing: Non weight bearing Pain:9/10 left hip. RN had given meds 30 minutes prior.  RN notified that pain reported pain had increased to 9/10 during the session following medication administration.  See FIM for current functional status  Therapy/Group: Individual Therapy  Alfredia Ferguson [facility] 12/21/2014, 4:43 PM

## 2014-12-21 NOTE — Progress Notes (Signed)
Patient ID: [name], female   DOB: 04-03-1951, 63 y.o.   MRN: [id number]  Patient ID: [name], female   DOB: 12-Nov-1951, 63 y.o.   MRN: [id number]   12/21/14.  HPI: [name] is a 63 y.o. right handed female admitted 12/15/2014 after a mechanical fall in the parking lot without loss of consciousness.  X-rays and imaging revealed a left comminuted displaced peritrochanteric femur fracture. Underwent ORIF with [employer] intramedullary nail 12/16/2014 per Dr. Veverly Fells. Nonweightbearing left lower extremity. Hospital course pain management. Subcutaneous Lovenox for DVT prophylaxis. Urine study ESBL UTI placed on Primaxin and completed/contact precautions. Acute blood loss anemia 9.0. Physical therapy evaluation completed 12/17/2014 with recommendations of physical medicine rehabilitation consult. Patient was admitted for comprehensive rehabilitation program on 12/19/14.  Comfortable night.  Pain reasonably well controlled.  Less left hip pain and drainage from surgical incision.  Bowel movement yesterday.  On complaint is some mild allergy related symptoms  ROS Review of Systems  Gastrointestinal: Positive for constipation.  Musculoskeletal: Positive for myalgias.  All other systems reviewed and are negative    Past Medical History  Diagnosis Date  . 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 Chickenpox   . Genital warts   . Environmental allergies   . Heart murmur   . HTN (hypertension)   . Hyperlipidemia   . Thyroid disease     Hypothyroid  . Thyroid nodule    Past Surgical History  Procedure Laterality Date  . Carpal tunnel release      Both wrist  . Cystectomy      From the cervix  . Intramedullary (im) nail intertrochanteric Left 12/15/2014    Procedure: INTRAMEDULLARY (IM) NAIL INTERTROCHANTRIC; Surgeon: Augustin Schooling, MD;  Location: [facility]; Service: Orthopedics; Laterality: Left;   Family History  Problem Relation Age of Onset  . Arthritis Mother   . Hyperlipidemia Mother   . Heart disease Father   . Stroke Father     x's 2  . High blood pressure Father   . High blood pressure Mother   . Diabetes Mother   . Myasthenia gravis Mother    Social History: reports that she has never smoked. She has never used smokeless tobacco. She reports that she drinks alcohol. She reports that she does not use illicit drugs. Allergies:  Allergies  Allergen Reactions  . Statins Diarrhea  . Eggs Or Egg-Derived Products Diarrhea and Rash   Medications Prior to Admission  Medication Sig Dispense Refill  . azelastine (ASTELIN) 0.1 % nasal spray Place 1 spray into both nostrils 2 (two) times daily. Use in each nostril as directed 90 mL 3  . fluticasone (FLONASE) 50 MCG/ACT nasal spray Place 2 sprays into both nostrils daily. 48 g 3  . ibuprofen (ADVIL,MOTRIN) 200 MG tablet Take 200-800 mg by mouth every 6 (six) hours as needed for moderate pain.    Marland Kitchen levothyroxine (SYNTHROID) 88 MCG tablet Take 1 tablet (88 mcg total) by mouth daily. 90 tablet 3  . valsartan (DIOVAN) 320 MG tablet TAKE 1 TABLET DAILY 90 tablet 3  . Vitamin D, Ergocalciferol, (DRISDOL) 50000 UNITS CAPS capsule Take 50,000 Units by mouth 3 (three) times a week.    . Cholecalciferol (VITAMIN D3 PO) Take 1 capsule by mouth 3 (three) times a week.      Patient Vitals for the past 24 hrs:  BP Temp Temp src Pulse Resp SpO2  12/21/14 0531 (!) 114/54 mmHg 97.7 F (36.5 C)  Oral 89 18 94 %  12/20/14 1330 (!) 145/62 mmHg 98.6 F (37 C) Oral 92 18 100 %  12/20/14 0939 (!) 141/59 mmHg - - 83 - 97 %     Intake/Output Summary (Last 24 hours) at 12/21/14 0818 Last data filed at  12/20/14 2200  Gross per 24 hour  Intake    600 ml  Output      0 ml  Net    600 ml     Physical Exam: Blood pressure 136/53, pulse 100, temperature 99.1 F (37.3 C), temperature source Oral, resp. rate 16, height $RemoveBe'5\' 5"'rcIXtpDaI$  (1.651 m), weight 87.544 kg (193 lb), SpO2 92 %. Physical Exam Constitutional: She is oriented to person, place, and time. She appears well-developed.  HENT:  Head: Normocephalic.  Eyes: EOM are normal.  Neck: Normal range of motion. Neck supple. No thyromegaly present.  Cardiovascular: Normal rate and regular rhythm.  Respiratory: Effort normal and breath sounds normal. No respiratory distress.  GI: Soft. Bowel sounds are normal. She exhibits no distension.  Neurological: She is alert and oriented to person, place, and time.  Skin:  Left hip incision is dressed appropriately tender , No erythema but serous drainage upper hip incision continues-requires frequent dressing changes Motor strength is 5/5 bilateral deltoids, biceps, triceps, grip, 4 minus right hip flexor and knee extensor and ankle dorsal flexor, 2 minus left hip flexor 3 minus knee extensor 4 minus ankle dorsal flexor on the left    Lab Results Last 48 Hours    Results for orders placed or performed during the hospital encounter of 12/15/14 (from the past 48 hour(s))  CBC Status: Abnormal   Collection Time: 12/17/14 5:00 AM  Result Value Ref Range   WBC 10.2 4.0 - 10.5 K/uL   RBC 3.16 (L) 3.87 - 5.11 MIL/uL   Hemoglobin 9.0 (L) 12.0 - 15.0 g/dL    Comment: DELTA CHECK NOTED REPEATED TO VERIFY    HCT 28.3 (L) 36.0 - 46.0 %   MCV 89.6 78.0 - 100.0 fL   MCH 28.5 26.0 - 34.0 pg   MCHC 31.8 30.0 - 36.0 g/dL   RDW 13.6 11.5 - 15.5 %   Platelets 211 150 - 400 K/uL    Comment: DELTA CHECK NOTED REPEATED TO VERIFY   Basic metabolic panel Status: Abnormal   Collection Time:  12/17/14 5:00 AM  Result Value Ref Range   Sodium 133 (L) 135 - 145 mmol/L    Comment: Please note change in reference range.   Potassium 4.0 3.5 - 5.1 mmol/L    Comment: Please note change in reference range.   Chloride 104 96 - 112 mEq/L   CO2 26 19 - 32 mmol/L   Glucose, Bld 157 (H) 70 - 99 mg/dL   BUN 16 6 - 23 mg/dL   Creatinine, Ser 0.74 0.50 - 1.10 mg/dL   Calcium 8.1 (L) 8.4 - 10.5 mg/dL   GFR calc non Af Amer 89 (L) >90 mL/min   GFR calc Af Amer >90 >90 mL/min    Comment: (NOTE) The eGFR has been calculated using the CKD EPI equation. This calculation has not been validated in all clinical situations. eGFR's persistently <90 mL/min signify possible Chronic Kidney Disease.    Anion gap 3 (L) 5 - 15  Basic metabolic panel Status: Abnormal   Collection Time: 12/18/14 4:43 AM  Result Value Ref Range   Sodium 136 135 - 145 mmol/L    Comment: Please note change in reference range.  Potassium 4.0 3.5 - 5.1 mmol/L    Comment: Please note change in reference range.   Chloride 106 96 - 112 mEq/L   CO2 26 19 - 32 mmol/L   Glucose, Bld 139 (H) 70 - 99 mg/dL   BUN 11 6 - 23 mg/dL   Creatinine, Ser 0.67 0.50 - 1.10 mg/dL   Calcium 8.4 8.4 - 10.5 mg/dL   GFR calc non Af Amer >90 >90 mL/min   GFR calc Af Amer >90 >90 mL/min    Comment: (NOTE) The eGFR has been calculated using the CKD EPI equation. This calculation has not been validated in all clinical situations. eGFR's persistently <90 mL/min signify possible Chronic Kidney Disease.    Anion gap 4 (L) 5 - 15  CBC Status: Abnormal   Collection Time: 12/18/14 4:43 AM  Result Value Ref Range   WBC 12.9 (H) 4.0 - 10.5 K/uL   RBC 3.28 (L) 3.87 - 5.11  MIL/uL   Hemoglobin 9.4 (L) 12.0 - 15.0 g/dL   HCT 29.0 (L) 36.0 - 46.0 %   MCV 88.4 78.0 - 100.0 fL   MCH 28.7 26.0 - 34.0 pg   MCHC 32.4 30.0 - 36.0 g/dL   RDW 13.3 11.5 - 15.5 %   Platelets 238 150 - 400 K/uL      Imaging Results (Last 48 hours)    No results found.       Medical Problem List and Plan: 1. Functional deficits secondary to left comminuted displaced peritrochanteric femur fracture. Status post ORIF. Nonweightbearing left lower extremity 2. DVT Prophylaxis/Anticoagulation: SQ Lovenox. Monitor for any bleeding episodes. Check vascular study 3. Pain Management: Hydrocodone and Robaxin as needed. Monitor with increased mobility on 4. Acute blood loss anemia. Follow-up CBC 5. Neuropsych: This patient is capable of making decisions on her own behalf. 6. Skin/Wound Care: Routine skin checks of surgical site 7. Fluids/Electrolytes/Nutrition: Strict I and O follow-up chemistries 8.ESBL?UTI.Primaxin completed/contact precaution 9. Hypertension. Avapro 300 mg daily. Monitor closely with increased mobility 10. Hypothyroidism. Synthroid

## 2014-12-21 NOTE — Progress Notes (Signed)
Physical Therapy Session Note  Patient Details  Name: [name] MRN: [id number] Date of Birth: [birth year]  Today's Date: 12/21/2014 PT Individual Time: 9476-5465 PT Individual Time Calculation (min): 30 min   Short Term Goals: Week 1:  PT Short Term Goal 1 (Week 1): Pt will initiate bed mobility with PT.  PT Short Term Goal 2 (Week 1): Pt will demonstrate sit to stand with CGA.  PT Short Term Goal 3 (Week 1): Pt will transfer w/c to/from bed with CGA and RW.  PT Short Term Goal 4 (Week 1): Pt will ambulate x 15' with RW req min A.  PT Short Term Goal 5 (Week 1): Pt will self propel manual w/c x 100' req SBA and verbal cues.   Skilled Therapeutic Interventions/Progress Updates:      Therapy Documentation Precautions:  NWB LLE   Locomotion : Wheelchair Mobility Distance: 150 feet   Seated there ex: Left and right ankle pumps 3x10 Left and right hip flexion 3x10 Left and right long arc quads 3x10  Sit to and from stand with rolling walker min assist NWB LLE.  Patient stood approximately 2 minutes NWB LLE with RW verbal cues to maintain NWB LLE and contatc guard assist.  Patient ambulated 10 feet with RW min assist with a hop to pattern. Patient educated on proper sequence and technique.  NWB LLE verbal cues to maintain NWB LLE.  Solid seat placed under wheelchair cushion to improve pelvic positioning and alignment and improve overall sitting tolerance.   Patient propelled wheelchair 150 feet with bilateral upper extremities and close supervision. Verbal cues for environmental awareness and obstacle negotiation as well as turns.   Patient tolerated treatment well. NWB LLE maintained throughout session. Vitals monitored and remained stable throughout session responding appropriately to activity. Patient was without pain during session. Patient tolerated session with rest breaks throughout. Call bell within reach and patient educated not to be up without assistance. Patient  verbalized understanding.    Therapy/Group: Individual Therapy  Retta Diones 12/21/2014, 1:39 PM

## 2014-12-21 NOTE — Progress Notes (Signed)
Occupational Therapy Session Note  Patient Details  Name: [name] MRN: [id number] Date of Birth: 1951-05-31  Today's Date: 12/21/2014 OT Individual Time: 0700-0800 OT Individual Time Calculation (min): 60 min    Short Term Goals: Week 1:  OT Short Term Goal 1 (Week 1): Pt will perform LB bathing with AE PRN and min assist. OT Short Term Goal 2 (Week 1): Pt will perform LB dressing with AE and min assist sit to stand. OT Short Term Goal 3 (Week 1): Pt will perform toilet transfers 3/4 attempts with min assist. OT Short Term Goal 4 (Week 1): Pt will perform shower/tub transfers simulated in dry environement with min assist.  Skilled Therapeutic Interventions/Progress Updates:    Pt engaged in bathing and dressing tasks w/c level at sink.  Pt requires mod A for sit<>stand with mod verbal cues to maintain NWB on LLE.  Pt completed bathing and dressing tasks with sit<>stand from w/c at sink.  Reacher introduced to assist with donning pants.  Pt requires extra time to complete tasks with multiple rest breaks.  Focus on activity tolerance, sit<>stand, functional transfers, standing balance, and safety awareness.  Therapy Documentation Precautions:  Precautions Precautions: Fall Restrictions Weight Bearing Restrictions: Yes LLE Weight Bearing: Non weight bearing Pain: Pain Assessment Pain Assessment: 0-10 Pain Score: 7  Pain Type: Acute pain;Surgical pain Pain Location: Hip Pain Orientation: Left Pain Descriptors / Indicators: Aching Pain Onset: With Activity Pain Intervention(s): Repositioned;RN made aware  See FIM for current functional status  Therapy/Group: Individual Therapy  Leroy Libman 12/21/2014, 8:06 AM

## 2014-12-22 ENCOUNTER — Encounter (HOSPITAL_COMMUNITY): Payer: 59

## 2014-12-22 ENCOUNTER — Inpatient Hospital Stay (HOSPITAL_COMMUNITY): Payer: 59

## 2014-12-22 ENCOUNTER — Inpatient Hospital Stay (HOSPITAL_COMMUNITY): Payer: 59 | Admitting: *Deleted

## 2014-12-22 DIAGNOSIS — S72002S Fracture of unspecified part of neck of left femur, sequela: Secondary | ICD-10-CM

## 2014-12-22 LAB — COMPREHENSIVE METABOLIC PANEL
ALT: 38 U/L — AB (ref 0–35)
ANION GAP: 8 (ref 5–15)
AST: 33 U/L (ref 0–37)
Albumin: 2.9 g/dL — ABNORMAL LOW (ref 3.5–5.2)
Alkaline Phosphatase: 52 U/L (ref 39–117)
BUN: 13 mg/dL (ref 6–23)
CALCIUM: 9 mg/dL (ref 8.4–10.5)
CO2: 28 mmol/L (ref 19–32)
Chloride: 102 mEq/L (ref 96–112)
Creatinine, Ser: 0.7 mg/dL (ref 0.50–1.10)
GFR calc Af Amer: >90 mL/min (ref >90)
GFR calc non Af Amer: >90 mL/min (ref >90)
GLUCOSE: 142 mg/dL — AB (ref 70–99)
Potassium: 5 mmol/L (ref 3.5–5.1)
SODIUM: 138 mmol/L (ref 135–145)
Total Bilirubin: 0.9 mg/dL (ref 0.3–1.2)
Total Protein: 5.9 g/dL — ABNORMAL LOW (ref 6.0–8.3)

## 2014-12-22 LAB — CBC WITH DIFFERENTIAL/PLATELET
Basophils Absolute: 0.1 10*3/uL (ref 0.0–0.1)
Basophils Relative: 1 % (ref 0–1)
EOS PCT: 4 % (ref 0–5)
Eosinophils Absolute: 0.6 10*3/uL (ref 0.0–0.7)
HCT: 27.6 % — ABNORMAL LOW (ref 36.0–46.0)
HEMOGLOBIN: 8.8 g/dL — AB (ref 12.0–15.0)
LYMPHS ABS: 2.5 10*3/uL (ref 0.7–4.0)
Lymphocytes Relative: 18 % (ref 12–46)
MCH: 28.1 pg (ref 26.0–34.0)
MCHC: 31.9 g/dL (ref 30.0–36.0)
MCV: 88.2 fL (ref 78.0–100.0)
MONOS PCT: 8 % (ref 3–12)
Monocytes Absolute: 1.1 10*3/uL — ABNORMAL HIGH (ref 0.1–1.0)
Neutro Abs: 9.7 10*3/uL — ABNORMAL HIGH (ref 1.7–7.7)
Neutrophils Relative %: 69 % (ref 43–77)
Platelets: 434 10*3/uL — ABNORMAL HIGH (ref 150–400)
RBC: 3.13 MIL/uL — AB (ref 3.87–5.11)
RDW: 14.4 % (ref 11.5–15.5)
WBC: 14 10*3/uL — ABNORMAL HIGH (ref 4.0–10.5)

## 2014-12-22 MED ORDER — METHOCARBAMOL 750 MG PO TABS
750.0000 mg | ORAL_TABLET | Freq: Four times a day (QID) | ORAL | Status: DC
  Administered 2014-12-22 – 2014-12-31 (×36): 750 mg via ORAL
  Filled 2014-12-22 (×42): qty 1

## 2014-12-22 MED ORDER — HYDROCODONE-ACETAMINOPHEN 5-325 MG PO TABS
1.0000 | ORAL_TABLET | Freq: Two times a day (BID) | ORAL | Status: DC
  Administered 2014-12-22 – 2014-12-31 (×16): 1 via ORAL
  Filled 2014-12-22 (×16): qty 1

## 2014-12-22 NOTE — Progress Notes (Signed)
Physical Therapy Session Note  Patient Details  Name: [name] MRN: [id number] Date of Birth: 1951/06/09  Today's Date: 12/22/2014 PT Individual Time: 1000-1100 PT Individual Time Calculation (min): 60 min   Short Term Goals: Week 1:  PT Short Term Goal 1 (Week 1): Pt will initiate bed mobility with PT.  PT Short Term Goal 2 (Week 1): Pt will demonstrate sit to stand with CGA.  PT Short Term Goal 3 (Week 1): Pt will transfer w/c to/from bed with CGA and RW.  PT Short Term Goal 4 (Week 1): Pt will ambulate x 15' with RW req min A.  PT Short Term Goal 5 (Week 1): Pt will self propel manual w/c x 100' req SBA and verbal cues.   Skilled Therapeutic Interventions/Progress Updates:    Patient received sitting in wheelchair. Session focused on wheelchair mobility and sit<>stand transfers. Patient's husband with questions/concerns about patient's CLOF with mobility and goals upon discharge. Patient's husband doubting patient's ability to reach mod I level and stating "she can't even go to the bathroom on her own yet." Educated/discussed with patient and husband that it would not be expected she would be able to do anything on her own at this point and we will continue to assess patient's mobility level and adjust goals and discharge date accordingly. Patient and husband verbalized understanding and in agreement at this time.  Wheelchair mobility 62' x1 with minA for turning/steering and verbal cues for proper sequencing. Sit<>stand transfers (with static standing in between for 10-30") from wheelchair with RW (R UE on RW and L UE pushes up from wheelchair) with minA, 1x5 reps then 1x 3 reps. Patient requires significant rest breaks between sit<>stand transfers and coaxing/encouragement due to anxiety. Patient reports "woosy feeling", more nauseous in nature than dizziness. Vitals stable. Patient returned to room and left sitting in wheelchair with all needs within reach.  Therapy  Documentation Precautions:  Precautions Precautions: Fall Restrictions Weight Bearing Restrictions: Yes LUE Weight Bearing: Non weight bearing LLE Weight Bearing: Non weight bearing Pain: Pain Assessment Pain Assessment: 0-10 Pain Score: 4  Pain Type: Surgical pain Pain Location: Back Pain Orientation: Lower Pain Descriptors / Indicators: Aching Pain Onset: On-going Pain Intervention(s): Repositioned;RN made aware;Ambulation/increased activity Multiple Pain Sites: No Locomotion : Ambulation Ambulation/Gait Assistance: Not tested (comment) Wheelchair Mobility Distance: 55   See FIM for current functional status  Therapy/Group: Individual Therapy  Lillia Abed. Joe Gee, PT, DPT 12/22/2014, 12:08 PM

## 2014-12-22 NOTE — Progress Notes (Signed)
Occupational Therapy Session Note  Patient Details  Name: Barbera Perritt MRN: [id number] Date of Birth: 12-02-1951  Today's Date: 12/22/2014 OT Individual Time: 1300-1400 OT Individual Time Calculation (min): 60 min    Short Term Goals: Week 1:  OT Short Term Goal 1 (Week 1): Pt will perform LB bathing with AE PRN and min assist. OT Short Term Goal 2 (Week 1): Pt will perform LB dressing with AE and min assist sit to stand. OT Short Term Goal 3 (Week 1): Pt will perform toilet transfers 3/4 attempts with min assist. OT Short Term Goal 4 (Week 1): Pt will perform shower/tub transfers simulated in dry environement with min assist.  Skilled Therapeutic Interventions/Progress Updates:    Pt engaged in BADL retraining including bathing and dressing with sit<>stand from w/c at sink.  Pt utilized AE (reacher and sock aide) to assist with LB dressing tasks.  Pt required min A for sit<>stand and standing balance.  Pt requires extra time to complete tasks with multiple rest breaks.  Pt is able to maintain NWB through RLE when standing.  Focus on activity tolerance, sit<>stand, standing balance, and safety awareness. Therapy Documentation Precautions:  Precautions Precautions: Fall Restrictions Weight Bearing Restrictions: Yes LUE Weight Bearing: Non weight bearing LLE Weight Bearing: Non weight bearing General:   Vital Signs:  Pain: Pain Assessment Pain Assessment: 0-10 Pain Score: 8  Pain Type: Acute pain;Surgical pain Pain Location: Hip Pain Orientation: Left Pain Descriptors / Indicators: Aching;Constant Pain Frequency: Intermittent Pain Onset: On-going Patients Stated Pain Goal: 2 Pain Intervention(s): RN made aware;Repositioned Multiple Pain Sites: No  See FIM for current functional status  Therapy/Group: Individual Therapy  Leroy Libman 12/22/2014, 2:31 PM

## 2014-12-22 NOTE — Progress Notes (Signed)
Physical Therapy Session Note  Patient Details  Name: [name] MRN: [id number] Date of Birth: December 10, 1951  Today's Date: 12/22/2014 PT Individual Time: 0800-0900 PT Individual Time Calculation (min): 60 min   Short Term Goals: Week 1:  PT Short Term Goal 1 (Week 1): Pt will initiate bed mobility with PT.  PT Short Term Goal 2 (Week 1): Pt will demonstrate sit to stand with CGA.  PT Short Term Goal 3 (Week 1): Pt will transfer w/c to/from bed with CGA and RW.  PT Short Term Goal 4 (Week 1): Pt will ambulate x 15' with RW req min A.  PT Short Term Goal 5 (Week 1): Pt will self propel manual w/c x 100' req SBA and verbal cues.   Skilled Therapeutic Interventions/Progress Updates:    Pt received seated in w/c, agreeable to participate in therapy. Session focused on standing tolerance, gait training, w/c propulsion, education on w/c management. Pt and husband educated on donning/doffing leg rests on w/c including extended leg rest for L. Pt propelled w/c 100'x2 to rehab gym w/ instruction on turning technique. Blocked practice sit<>stand from w/c w/ overall CGA for RW management, pt able to maintain NWB on LLE without additional cueing. Pt educated on using hands for balance instead of weightbearing during static standing to reduce fatigue in UE. Gait training w/ emphasis on soft landing on R instead of hopping to reduce pain in L hip during gait. 10' x3 w/ RW and MinGuard A. Seated in w/c pt completed 2x10 LAQ and ankle pumps on LLE. Session ended in pt's room where pt was left seated in w/c w/ all needs within reach and husband present.   Therapy Documentation Precautions:  Precautions Precautions: Fall Restrictions Weight Bearing Restrictions: Yes LUE Weight Bearing: Non weight bearing LLE Weight Bearing: Non weight bearing Pain: Pain Assessment Pain Score: 4   See FIM for current functional status  Therapy/Group: Individual Therapy  Rada Hay  Rada Hay, PT,  DPT 12/22/2014, 7:39 AM

## 2014-12-22 NOTE — Progress Notes (Signed)
[facility] PHYSICAL MEDICINE & REHABILITATION     PROGRESS NOTE    Subjective/Complaints: Had a lot of hip pain and low back pain last night. Kept her up. Pain runs down to the top of thigh  Objective: Vital Signs: Blood pressure 148/74, pulse 89, temperature 98.7 F (37.1 C), temperature source Oral, resp. rate 19, SpO2 99 %. No results found.  Recent Labs  12/22/14 0610  WBC 14.0*  HGB 8.8*  HCT 27.6*  PLT 434*    Recent Labs  12/22/14 0610  NA 138  K 5.0  CL 102  GLUCOSE 142*  BUN 13  CREATININE 0.70  CALCIUM 9.0   CBG (last 3)  No results for input(s): GLUCAP in the last 72 hours.  Wt Readings from Last 3 Encounters:  12/16/14 87.544 kg (193 lb)  08/04/14 87 kg (191 lb 12.8 oz)  01/01/14 87.544 kg (193 lb)    Physical Exam:  Constitutional: She is oriented to person, place, and time. She appears well-developed.  HENT:  Head: Normocephalic.  Eyes: EOM are normal.  Neck: Normal range of motion. Neck supple. No thyromegaly present.  Cardiovascular: Normal rate and regular rhythm.  Respiratory: Effort normal and breath sounds normal. No respiratory distress.  GI: Soft. Bowel sounds are normal. She exhibits no distension.  Neurological: She is alert and oriented to person, place, and time.  Skin:  Left hip incision is dressed appropriately tender , No erythema but serous drainage upper hip incision Motor strength is 5/5 bilateral deltoids, biceps, triceps, grip, 4 minus right hip flexor and knee extensor and ankle dorsal flexor, 2 minus left hip flexor 3 minus knee extensor 4 minus ankle dorsal flexor on the left   Assessment/Plan: 1. Functional deficits secondary to left comminuted and displaced peritrochanteric femur fracture which require 3+ hours per day of interdisciplinary therapy in a comprehensive inpatient rehab setting. Physiatrist is providing close team supervision and 24 hour management of active medical problems listed  below. Physiatrist and rehab team continue to assess barriers to discharge/monitor patient progress toward functional and medical goals. FIM: FIM - Bathing Bathing Steps Patient Completed: Chest, Right Arm, Left Arm, Abdomen, Right upper leg, Left upper leg Bathing: 3: Mod-Patient completes 5-7 36f 10 parts or 50-74%  FIM - Upper Body Dressing/Undressing Upper body dressing/undressing steps patient completed: Thread/unthread right sleeve of pullover shirt/dresss, Thread/unthread left sleeve of pullover shirt/dress, Put head through opening of pull over shirt/dress, Pull shirt over trunk Upper body dressing/undressing: 5: Supervision: Safety issues/verbal cues FIM - Lower Body Dressing/Undressing Lower body dressing/undressing steps patient completed: Thread/unthread left underwear leg, Thread/unthread left pants leg, Thread/unthread right pants leg, Thread/unthread right underwear leg Lower body dressing/undressing: 2: Max-Patient completed 25-49% of tasks  FIM - Toileting Toileting: 2: Max-Patient completed 1 of 3 steps  FIM - Radio producer Devices: Engineer, civil (consulting), Insurance account manager Transfers: 3-To toilet/BSC: Mod A (lift or lower assist), 3-From toilet/BSC: Mod A (lift or lower assist)  FIM - Control and instrumentation engineer Devices: Walker, Arm rests Bed/Chair Transfer: 4: Bed > Chair or W/C: Min A (steadying Pt. > 75%), 4: Chair or W/C > Bed: Min A (steadying Pt. > 75%)  FIM - Locomotion: Wheelchair Distance: 150 feet (Increased time to accomplish) Locomotion: Wheelchair: 5: Travels 150 ft or more: maneuvers on rugs and over door sills with supervision, cueing or coaxing FIM - Locomotion: Ambulation Locomotion: Ambulation Assistive Devices: Administrator Ambulation/Gait Assistance: 4: Min assist Locomotion: Ambulation: 1: Travels less than  41 ft with minimal assistance (Pt.>75%)  Comprehension Comprehension Mode:  Auditory Comprehension: 7-Follows complex conversation/direction: With no assist  Expression Expression Mode: Verbal Expression: 6-Expresses complex ideas: With extra time/assistive device  Social Interaction Social Interaction: 7-Interacts appropriately with others - No medications needed.  Problem Solving Problem Solving: 5-Solves complex 90% of the time/cues < 10% of the time  Memory Memory: 6-More than reasonable amt of time  Medical Problem List and Plan: 1. Functional deficits secondary to left comminuted displaced peritrochanteric femur fracture. Status post ORIF. Nonweightbearing left lower extremity 2. DVT Prophylaxis/Anticoagulation: SQ Lovenox. Monitor for any bleeding episodes.   3. Pain Management: Hydrocodone and Robaxin as needed. Monitor with increased mobility on  -have scheduled robaxin and hydrocodone for now  -ice aggressively to leg 4. Acute blood loss anemia. Follow-up CBC 5. Neuropsych: This patient is capable of making decisions on her own behalf. 6. Skin/Wound Care: Routine skin checks of surgical site 7. Fluids/Electrolytes/Nutrition: Strict I and O follow-up chemistries 8.ESBL?UTI.Primaxin completed/contact precaution 9. Hypertension. Avapro 300 mg daily. Monitor closely with increased mobility 10. Hypothyroidism. Synthroid 11. Leukoctosis: has ranged from 10k to 16k---afebrile  -wound still draining however  -observe clinically   LOS (Days) 4 A FACE TO FACE EVALUATION WAS PERFORMED  Mistie Adney T 12/22/2014 8:54 AM

## 2014-12-22 NOTE — Progress Notes (Signed)
Social Work  Social Work Assessment and Plan  Patient Details  Name: [name] MRN: [id number] Date of Birth: [birth year]  Today's Date: 12/22/2014  Problem List:  Patient Active Problem List   Diagnosis Date Noted  . Hip fracture 12/18/2014  . Fall   . Subtrochanteric fracture of femur 12/16/2014  . UTI (urinary tract infection) 12/16/2014  . Leukocytosis 12/16/2014  . Hip fracture requiring operative repair   . Closed left hip fracture 12/15/2014  . Lesion of right humerus 12/15/2014  . Osteoporosis, unspecified 10/15/2013  . HTN (hypertension) 07/16/2013  . Hypothyroidism 07/16/2013  . Osteopenia 07/16/2013  . Environmental allergies 07/16/2013  . Obesity (BMI 30-39.9) 07/16/2013   Past Medical History:  Past Medical History  Diagnosis Date  . 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 Chickenpox   . Genital warts   . Environmental allergies   . Heart murmur   . HTN (hypertension)   . Hyperlipidemia   . Thyroid disease     Hypothyroid  . Thyroid nodule    Past Surgical History:  Past Surgical History  Procedure Laterality Date  . Carpal tunnel release      Both wrist  . Cystectomy      From the cervix  . Intramedullary (im) nail intertrochanteric Left 12/15/2014    Procedure: INTRAMEDULLARY (IM) NAIL INTERTROCHANTRIC;  Surgeon: Augustin Schooling, MD;  Location: [facility];  Service: Orthopedics;  Laterality: Left;   Social History:  reports that she has never smoked. She has never used smokeless tobacco. She reports that she drinks alcohol. She reports that she does not use illicit drugs.  Family / Support Systems Marital Status: Married Patient Roles: Spouse, Other (Comment) (employee) Spouse/Significant Other: spouse, Ajla Mcgeachy @ (C) [phone] Children: none Other Supports: none Anticipated Caregiver: spouse Ability/Limitations of Caregiver: none. unemployed. recently hospitalized for ITP per wife Caregiver Availability: 24/7 Family  Dynamics: Husband very involved in completion of assessment interview and appeared very eager to stress he does not want pt "rushed home before she's ready."  Husband has been staying at hospital with pt quite a bit and appears supportive.  Social History Preferred language: English Religion: Jewish Cultural Background: NA Education: college Read: Yes Write: Yes Employment Status: Employed Name of Employer: [employer] - works from her home Return to Work Plans: Pt fully intends to return to work as she is able Freight forwarder Issues: None Guardian/Conservator: None - per MD, pt is capable of making decisions on her own behalf   Abuse/Neglect Physical Abuse: Denies Verbal Abuse: Denies Sexual Abuse: Denies Exploitation of patient/patient's resources: Denies Self-Neglect: Denies  Emotional Status Pt's affect, behavior adn adjustment status: Pt very pleasant and able to complete interview without difficulty,however, husband with many questions throughout our discussion.  Pt admits she is "a little anxious" about how much gain she will make here towards being independent.  She denies any significant emotional distress.  No s/s of depression or anxiety - will monitor. Recent Psychosocial Issues: Husband with active health issues and was recently hospitalized. Pyschiatric History: none Substance Abuse History: none  Patient / Family Perceptions, Expectations & Goals Pt/Family understanding of illness & functional limitations: Pt and husband with general understanding of her injury, non-weight bearing restrictions and functional limitations overall. Premorbid pt/family roles/activities: Pt was completely independent and working full-time from home Anticipated changes in roles/activities/participation: changes expected to only be temporary;  pt is able to work from home Pt/family expectations/goals: "I just want to  be able to get around my house as independent as possible.  Community  Resources Express Scripts: None Premorbid Home Care/DME Agencies: None Transportation available at discharge: yes  Discharge Planning Living Arrangements: Spouse/significant other Support Systems: Spouse/significant other Type of Residence: Private residence Administrator, sports: Multimedia programmer (specify) Psychologist, counselling) Financial Resources: Employment Museum/gallery curator Screen Referred: No Living Expenses: Higher education careers adviser Management: Patient Does the patient have any problems obtaining your medications?: No Home Management: pt and spouse share Patient/Family Preliminary Plans: Pt to d/c home with husband as sole support. Barriers to Discharge: Steps (may need ramp?) Social Work Anticipated Follow Up Needs: HH/OP Expected length of stay: [facility] 7 to 10 days  Clinical Impression Pleasant woman here following a fall with a femur fx.  Husband at bedside and very supportive.  Anticipate fairly short LOS and hoping for mod i goals.  Denies any significant emotional distress.  Will follow for support and d/c planning needs.  Emmett Bracknell 12/22/2014, 1:54 PM

## 2014-12-22 NOTE — Progress Notes (Signed)
Patient information reviewed and entered into eRehab system by Faron Tudisco, RN, CRRN, PPS Coordinator.  Information including medical coding and functional independence measure will be reviewed and updated through discharge.    

## 2014-12-23 ENCOUNTER — Encounter (HOSPITAL_COMMUNITY): Payer: 59

## 2014-12-23 ENCOUNTER — Inpatient Hospital Stay (HOSPITAL_COMMUNITY): Payer: 59

## 2014-12-23 ENCOUNTER — Inpatient Hospital Stay (HOSPITAL_COMMUNITY): Payer: 59 | Admitting: *Deleted

## 2014-12-23 DIAGNOSIS — D62 Acute posthemorrhagic anemia: Secondary | ICD-10-CM

## 2014-12-23 NOTE — Progress Notes (Signed)
Social Work Patient ID: [name], female   DOB: May 19, 1951, 64 y.o.   MRN: [id number]    Have reviewed team conference with pt and husband.  Both aware and agreeable with targeted d/c date of 1/6, however, husband a little apprehensive that she might not be "ready".  Explained to both that goals are being set for supervision and team will continue to monitor her progress and adjust date if indicated.  Provided husband with a brochure on temporary ramp as he feels they must have this in place.  Will continue to follow for support and d/c planning needs.  Tenae Graziosi, LCSW

## 2014-12-23 NOTE — Progress Notes (Signed)
Occupational Therapy Note  Patient Details  Name: [name] MRN: [id number] Date of Birth: 03/12/1951  Today's Date: 12/23/2014 OT Individual Time: 1300-1400 OT Individual Time Calculation (min): 60 min   Pt c/o 6/10 pain in left hip with activity; RN aware and repositioned Individual Therapy   Pt practiced shower transfers, tub bench transfers, bed mobility, and w/c mobility.  Pt unable to step over ledge for walk-in shower or lift LLE into tub.  Pt requires min A for sit<>supine and rolling onto right side without bed rails.  Pt navigated and propelled w/c through cluttered environment. Pt maintain NWB status during transfers and amb for approx 5'.  Leotis Shames [facility] 12/23/2014, 2:35 PM

## 2014-12-23 NOTE — Progress Notes (Signed)
Physical Therapy Session Note  Patient Details  Name: [name] MRN: [id number] Date of Birth: May 12, 1951  Today's Date: 12/23/2014 PT Individual Time: 1000-1100 PT Individual Time Calculation (min): 60 min   Short Term Goals: Week 1:  PT Short Term Goal 1 (Week 1): Pt will initiate bed mobility with PT.  PT Short Term Goal 2 (Week 1): Pt will demonstrate sit to stand with CGA.  PT Short Term Goal 3 (Week 1): Pt will transfer w/c to/from bed with CGA and RW.  PT Short Term Goal 4 (Week 1): Pt will ambulate x 15' with RW req min A.  PT Short Term Goal 5 (Week 1): Pt will self propel manual w/c x 100' req SBA and verbal cues.   Skilled Therapeutic Interventions/Progress Updates:    Patient received sitting in wheelchair. Session focused on increasing activity tolerance with functional transfers and gait training. Wheelchair mobility to increase activity tolerancey 35' x2 with B UE and supervision cues for increased efficiency. Gait training 17'x2 with RW and min guard, verbal cues for standing rest breaks and maintaining BOS within RW. Verbal/visual cues for managing RW during turning, cues to not pivot on R LE, but to continue clearing foot just like during ambulation in order to avoid knee injury. Patient requires prolonged rest break in between bouts of gait. Sit<>stand transfers w/c<>RW x8 with min guard progressing to close supervision.  According to PT eval, patient's husband planning on either installing ramp or getting temporary ramp. Discussion about STE home (patient has 2 without rails). Demonstration of negotiating one step backwards with RW and maintaining NWB on L LE then immediately sitting in chair placed at top of second step. Patient stating she "doesn't have the strength" and "doesn't feel confident" negotiating stairs that way. Relayed this info to husband, who states he had already been planning on a temporary ramp. Patient returned to room and left sitting in wheelchair  with all needs within reach.  Therapy Documentation Precautions:  Precautions Precautions: Fall Restrictions Weight Bearing Restrictions: Yes LUE Weight Bearing:  (no weight bearing precautions for L UE) LLE Weight Bearing: Non weight bearing Pain: Pain Assessment Pain Assessment: 0-10 Pain Score: 8  Pain Type: Acute pain Pain Location: Hip Pain Orientation: Left Pain Descriptors / Indicators: Aching Pain Onset: With Activity Pain Intervention(s): Medication (See eMAR) Multiple Pain Sites: No Locomotion : Ambulation Ambulation/Gait Assistance: 4: Min guard Wheelchair Mobility Distance: 35   See FIM for current functional status  Therapy/Group: Individual Therapy  Lillia Abed. Katyana Trolinger, PT, DPT 12/23/2014, 12:04 PM

## 2014-12-23 NOTE — Progress Notes (Signed)
[place] PHYSICAL MEDICINE & REHABILITATION     PROGRESS NOTE    Subjective/Complaints: Did much better last night. Was able to sleep. Pain decreased. Denies fever---no VS recorded yesterday!!  Objective: Vital Signs: Blood pressure 148/74, pulse 89, temperature 98.7 F (37.1 C), temperature source Oral, resp. rate 19, SpO2 99 %. No results found.  Recent Labs  12/22/14 0610  WBC 14.0*  HGB 8.8*  HCT 27.6*  PLT 434*    Recent Labs  12/22/14 0610  NA 138  K 5.0  CL 102  GLUCOSE 142*  BUN 13  CREATININE 0.70  CALCIUM 9.0   CBG (last 3)  No results for input(s): GLUCAP in the last 72 hours.  Wt Readings from Last 3 Encounters:  12/16/14 87.544 kg (193 lb)  08/04/14 87 kg (191 lb 12.8 oz)  01/01/14 87.544 kg (193 lb)    Physical Exam:  Constitutional: She is oriented to person, place, and time. She appears well-developed.  HENT:  Head: Normocephalic.  Eyes: EOM are normal.  Neck: Normal range of motion. Neck supple. No thyromegaly present.  Cardiovascular: Normal rate and regular rhythm.  Respiratory: Effort normal and breath sounds normal. No respiratory distress.  GI: Soft. Bowel sounds are normal. She exhibits no distension.  Neurological: She is alert and oriented to person, place, and time.  Skin:  Left hip incision is dressed appropriately tender , No erythema but serous drainage upper hip incision Motor strength is 5/5 bilateral deltoids, biceps, triceps, grip, 4 minus right hip flexor and knee extensor and ankle dorsal flexor, 2 minus left hip flexor 3 minus knee extensor 4 minus ankle dorsal flexor on the left   Assessment/Plan: 1. Functional deficits secondary to left comminuted and displaced peritrochanteric femur fracture which require 3+ hours per day of interdisciplinary therapy in a comprehensive inpatient rehab setting. Physiatrist is providing close team supervision and 24 hour management of active medical problems listed  below. Physiatrist and rehab team continue to assess barriers to discharge/monitor patient progress toward functional and medical goals. FIM: FIM - Bathing Bathing Steps Patient Completed: Chest, Right Arm, Left Arm, Abdomen, Right upper leg, Left upper leg, Front perineal area Bathing: 3: Mod-Patient completes 5-7 5f 10 parts or 50-74%  FIM - Upper Body Dressing/Undressing Upper body dressing/undressing steps patient completed: Thread/unthread right sleeve of pullover shirt/dresss, Thread/unthread left sleeve of pullover shirt/dress, Put head through opening of pull over shirt/dress, Pull shirt over trunk Upper body dressing/undressing: 5: Supervision: Safety issues/verbal cues FIM - Lower Body Dressing/Undressing Lower body dressing/undressing steps patient completed: Thread/unthread right underwear leg, Thread/unthread left underwear leg, Pull underwear up/down, Thread/unthread right pants leg, Thread/unthread left pants leg, Pull pants up/down, Don/Doff right shoe, Don/Doff left shoe Lower body dressing/undressing: 2: Max-Patient completed 25-49% of tasks  FIM - Toileting Toileting steps completed by patient: Adjust clothing prior to toileting, Performs perineal hygiene, Adjust clothing after toileting Toileting Assistive Devices: Grab bar or rail for support Toileting: 4: Steadying assist  FIM - Radio producer Devices: Engineer, civil (consulting), Insurance account manager Transfers: 3-To toilet/BSC: Mod A (lift or lower assist), 3-From toilet/BSC: Mod A (lift or lower assist)  FIM - Control and instrumentation engineer Devices: Walker, Arm rests Bed/Chair Transfer: 4: Bed > Chair or W/C: Min A (steadying Pt. > 75%), 4: Chair or W/C > Bed: Min A (steadying Pt. > 75%)  FIM - Locomotion: Wheelchair Distance: 55 Locomotion: Wheelchair: 2: Travels 50 - 149 ft with minimal assistance (Pt.>75%) FIM - Locomotion: Ambulation Locomotion:  Ambulation Assistive Devices: Walker  - Rolling Ambulation/Gait Assistance: Not tested (comment) Locomotion: Ambulation: 0: Activity did not occur  Comprehension Comprehension Mode: Auditory Comprehension: 7-Follows complex conversation/direction: With no assist  Expression Expression Mode: Verbal Expression: 7-Expresses complex ideas: With no assist  Social Interaction Social Interaction: 7-Interacts appropriately with others - No medications needed.  Problem Solving Problem Solving: 5-Solves complex 90% of the time/cues < 10% of the time  Memory Memory: 6-More than reasonable amt of time  Medical Problem List and Plan: 1. Functional deficits secondary to left comminuted displaced peritrochanteric femur fracture. Status post ORIF. Nonweightbearing left lower extremity 2. DVT Prophylaxis/Anticoagulation: SQ Lovenox. Monitor for any bleeding episodes.   3. Pain Management: Hydrocodone and Robaxin as needed. Monitor with increased mobility on  -have scheduled robaxin and hydrocodone for now which helped  -ice aggressively to leg 4. Acute blood loss anemia. Follow-up CBC 5. Neuropsych: This patient is capable of making decisions on her own behalf. 6. Skin/Wound Care: Routine skin checks of surgical site 7. Fluids/Electrolytes/Nutrition: Strict I and O follow-up chemistries 8.ESBL?UTI.Primaxin completed/contact precaution 9. Hypertension. Avapro 300 mg daily. Monitor closely with increased mobility 10. Hypothyroidism. Synthroid 11. Leukoctosis: has ranged from 10k to 16k---afebrile  -wound with serous drainage  -observe clinically   LOS (Days) 5 A FACE TO FACE EVALUATION WAS PERFORMED  Naly Schwanz T 12/23/2014 8:30 AM

## 2014-12-23 NOTE — Progress Notes (Signed)
Occupational Therapy Session Note  Patient Details  Name: Novalyn Lajara MRN: [id number] Date of Birth: 11/12/1951  Today's Date: 12/23/2014 OT Individual Time: 0800-0900 OT Individual Time Calculation (min): 60 min    Short Term Goals: Week 1:  OT Short Term Goal 1 (Week 1): Pt will perform LB bathing with AE PRN and min assist. OT Short Term Goal 2 (Week 1): Pt will perform LB dressing with AE and min assist sit to stand. OT Short Term Goal 3 (Week 1): Pt will perform toilet transfers 3/4 attempts with min assist. OT Short Term Goal 4 (Week 1): Pt will perform shower/tub transfers simulated in dry environement with min assist.  Skilled Therapeutic Interventions/Progress Updates:    Pt resting in bed upon arrival and agreeable to participating in bathing and dressing w/c level at sink.  Pt performed SPT bed->w/c with min A.  Pt maintains NWB status throughout transfer.  Pt completed bathing and dressing tasks using AE appropriately for LB bathing and dressing tasks.  Pt requires extra time to complete tasks and multiple rest breaks throughout session.  Pt c/o increased pain with transitional movements but employs pain management strategies to alleviate.  Focus on activity tolerance, functional transfers, sit<>stand, standing balance, AE use, energy conservation, and safety awareness.  Therapy Documentation Precautions:  Precautions Precautions: Fall Restrictions Weight Bearing Restrictions: Yes LUE Weight Bearing: Non weight bearing LLE Weight Bearing: Non weight bearing  Pain: Pain Assessment Pain Assessment: 0-10 Pain Score: 6  Pain Type: Acute pain Pain Location: Hip Pain Orientation: Left Pain Descriptors / Indicators: Aching Pain Frequency: Intermittent Pain Onset: With Activity Pain Intervention(s): RN made aware;Repositioned  See FIM for current functional status  Therapy/Group: Individual Therapy  Leroy Libman 12/23/2014, 9:12 AM

## 2014-12-23 NOTE — Patient Care Conference (Signed)
Inpatient RehabilitationTeam Conference and Plan of Care Update Date: 12/23/2014   Time: 9:40 AM    Patient Name: [name]      Medical Record Number: [id number]  Date of Birth: 15-Oct-1951 Sex: Female         Room/Bed: 4W14C/4W14C-01 Payor Info: Payor: [employer] / Plan: Electrical engineer / Product Type: *No Product type* /    Admitting Diagnosis: femur fx  Admit Date/Time:  12/18/2014  5:23 PM Admission Comments: No comment available   Primary Diagnosis:  <principal problem not specified> Principal Problem: <principal problem not specified>  Patient Active Problem List   Diagnosis Date Noted  . Acute blood loss anemia 12/23/2014  . Fracture, intertrochanteric, left femur 12/18/2014  . Fall   . Subtrochanteric fracture of femur 12/16/2014  . UTI (urinary tract infection) 12/16/2014  . Leukocytosis 12/16/2014  . Hip fracture requiring operative repair   . Closed left hip fracture 12/15/2014  . Lesion of right humerus 12/15/2014  . Osteoporosis, unspecified 10/15/2013  . HTN (hypertension) 07/16/2013  . Hypothyroidism 07/16/2013  . Osteopenia 07/16/2013  . Environmental allergies 07/16/2013  . Obesity (BMI 30-39.9) 07/16/2013    Expected Discharge Date: Expected Discharge Date: 12/31/14  Team Members Present: Physician leading conference: Dr. Alger Simons Social Worker Present: Lennart Pall, LCSW Nurse Present: Heather Roberts, RN PT Present: Melene Plan, PT OT Present: Roanna Epley, Griffin Basil, OT SLP Present: Gunnar Fusi, SLP     Current Status/Progress Goal Weekly Team Focus  Medical   left trochanteric fx, wound draining, pain is controlled  improve pain mgt, ortho precautions  wound care, pain, anxiety mgt   Bowel/Bladder   continent of bowel and bladder  remain continent of bowel and bladder  Educate about s/s and cauises of constipation   Swallow/Nutrition/ Hydration             ADL's   UB bathing/dressing-supervision; LB bathing-mod A; LB  dressing-mod A; functional transfers-mod A;   supervision overall  functional transers; standing balance, LB bathing/dressing with AE   Mobility   minA overall  supervision-mod I  functional mobility, education, safety, balance, pain management   Communication             Safety/Cognition/ Behavioral Observations            Pain   Pain to left hip and thigh  pain equal to or less than 3 on scale of 0-10  Assess pain q4h and prn   Skin   4 small incisions to L hip area, staples patent  no skin injury/breakdown, no s/s of infection to incision sites  assess wounds q shift, change dressing prn    Rehab Goals Patient on target to meet rehab goals: Yes *See Care Plan and progress notes for long and short-term goals.  Barriers to Discharge: pain, anxiety    Possible Resolutions to Barriers:  pain control, pt education, transfer/mobility techniques    Discharge Planning/Teaching Needs:  home with husband who can provide 24/7 supervision      Team Discussion:  Pain, still with hip drainage.  Making good gains but slightly anxious (better when spouse not present).  Supervision goals.  Need to determine stair goal and whether ramp is needed.  Revisions to Treatment Plan:  NA   Continued Need for Acute Rehabilitation Level of Care: The patient requires daily medical management by a physician with specialized training in physical medicine and rehabilitation for the following conditions: Daily direction of a multidisciplinary physical rehabilitation program to ensure safe  treatment while eliciting the highest outcome that is of practical value to the patient.: Yes Daily medical management of patient stability for increased activity during participation in an intensive rehabilitation regime.: Yes Daily analysis of laboratory values and/or radiology reports with any subsequent need for medication adjustment of medical intervention for : Post surgical problems;Other  [name], [place] 12/23/2014,  4:07 PM

## 2014-12-24 ENCOUNTER — Inpatient Hospital Stay (HOSPITAL_COMMUNITY): Payer: 59 | Admitting: Physical Therapy

## 2014-12-24 ENCOUNTER — Inpatient Hospital Stay (HOSPITAL_COMMUNITY): Payer: 59

## 2014-12-24 ENCOUNTER — Encounter (HOSPITAL_COMMUNITY): Payer: 59

## 2014-12-24 DIAGNOSIS — D62 Acute posthemorrhagic anemia: Secondary | ICD-10-CM

## 2014-12-24 DIAGNOSIS — S72142S Displaced intertrochanteric fracture of left femur, sequela: Secondary | ICD-10-CM

## 2014-12-24 LAB — CBC
HCT: 28.3 % — ABNORMAL LOW (ref 36.0–46.0)
HEMOGLOBIN: 8.8 g/dL — AB (ref 12.0–15.0)
MCH: 27.8 pg (ref 26.0–34.0)
MCHC: 31.1 g/dL (ref 30.0–36.0)
MCV: 89.6 fL (ref 78.0–100.0)
Platelets: 512 10*3/uL — ABNORMAL HIGH (ref 150–400)
RBC: 3.16 MIL/uL — AB (ref 3.87–5.11)
RDW: 15.2 % (ref 11.5–15.5)
WBC: 14.2 10*3/uL — ABNORMAL HIGH (ref 4.0–10.5)

## 2014-12-24 NOTE — Progress Notes (Signed)
Physical Therapy Session Note  Patient Details  Name: [name] MRN: [id number] Date of Birth: Apr 26, 1951  Today's Date: 12/24/2014 PT Individual Time: 1030-1100 Treatment Session 2: 1530-1605 PT Individual Time Calculation (min): 30 min  Treatment Session 2: 35 min  Short Term Goals: Week 1:  PT Short Term Goal 1 (Week 1): Pt will initiate bed mobility with PT.  PT Short Term Goal 2 (Week 1): Pt will demonstrate sit to stand with CGA.  PT Short Term Goal 3 (Week 1): Pt will transfer w/c to/from bed with CGA and RW.  PT Short Term Goal 4 (Week 1): Pt will ambulate x 15' with RW req min A.  PT Short Term Goal 5 (Week 1): Pt will self propel manual w/c x 100' req SBA and verbal cues.   Skilled Therapeutic Interventions/Progress Updates:    W/C Management: PT instructs pt in w/c propulsion with B UEs x 45' req Supervision and encouragement for this distance. PT instructs pt in legrest management and pt demonstrates ability to get standard, R swing away legrest with increased time mod I, but req min A with elevating L legrest due to difficulty aligning 2 holes with 2 pegs.   Gait Training: PT instructs pt in ambulation with RW x 15' + 20' with seated rest break in a hopping fashion - pt demonstrates improved L foot clearance per NWB precautions compared to evaluation day and req encouragement and SBA for safety to achieve this distance.   Treatment Session 2: Gait Training: PT instructs pt in ascending/descending one 5" step with B handrails req min A; pt is unable to do a second rep due to anxiety/fear of falling.  PT instructs pt in ascending/descneding one 4" step x 3 reps with RW using backwards approach req min A.   W/C Management: PT instructs pt in w/c propulsion x 100' x 2 reps req SBA and verbal cues for technique - improved management of w/c noted.    Pt is slowly improving - continues to be distracted by small aches and pains, but participates well with rest breaks and  encouragement. Husband verbalizes concern about the plan to have a ramp built or rented, citing significant costs. PT explains that stair depth was measured to be 17" by husband and that pt req at least 18.5" for all 4 legs of RW to fit on stair for safety in ascending steps with this AD. PT explains that pt can practice going up/down stairs backwards (ascent) with RW with husband stabilizing RW for safety. Pt strongly dislikes stair training due to anxiety and fear and reports that they are going to get a ramp.  Continue per PT POC.    Therapy Documentation Precautions:  Precautions Precautions: Fall Restrictions Weight Bearing Restrictions: Yes LUE Weight Bearing: Non weight bearing LLE Weight Bearing: Non weight bearing Pain: Pain Assessment Pain Assessment: 0-10 Pain Score: 3  Pain Type: Surgical pain Pain Location: Hip Pain Orientation: Left Pain Descriptors / Indicators: Aching Pain Onset: On-going Patients Stated Pain Goal: 3 Pain Intervention(s): Rest Multiple Pain Sites: Yes 2nd Pain Site Pain Type: Acute pain Pain Location: Head Pain Descriptors / Indicators: Aching Pain Onset: Gradual Pain Intervention(s): Distraction  Treatment Session 2: Pt c/o 6/10 pain in low back - PT utilizes repositioning to decrease pain. Pt c/o 7/10 pain in L hip with stair trial - PT utilizes rest to decrease pain.    See FIM for current functional status  Therapy/Group: Individual Therapy  [name] M 12/24/2014, 10:35 AM

## 2014-12-24 NOTE — Progress Notes (Signed)
Occupational Therapy Note  Patient Details  Name: [name] MRN: [id number] Date of Birth: 04-20-1951  Today's Date: 12/24/2014 OT Individual Time: 1330-1430 OT Individual Time Calculation (min): 60 min   Pt c/o 5/10 pain in left hip with activity; RN aware and repositoined Individual Therapy  Pt's husband to discuss door measurements and bathroom arrangement.  Recommended tub transfer bench and BSC.  Pt and husband verbalized understanding and agreement.  Pt practiced tub bench transfers X 3 with min A to lift LLE into/out of tub.  Pt required extra time to complete tasks and rest breaks between segments of task.   Leotis Shames [facility] 12/24/2014, 2:57 PM

## 2014-12-24 NOTE — Progress Notes (Signed)
Occupational Therapy Session Note  Patient Details  Name: [name] MRN: [id number] Date of Birth: [birth year]  Today's Date: 12/24/2014 OT Individual Time: 0800-0900 OT Individual Time Calculation (min): 60 min    Short Term Goals: Week 1:  OT Short Term Goal 1 (Week 1): Pt will perform LB bathing with AE PRN and min assist. OT Short Term Goal 2 (Week 1): Pt will perform LB dressing with AE and min assist sit to stand. OT Short Term Goal 3 (Week 1): Pt will perform toilet transfers 3/4 attempts with min assist. OT Short Term Goal 4 (Week 1): Pt will perform shower/tub transfers simulated in dry environement with min assist.  Skilled Therapeutic Interventions/Progress Updates:    Pt seated EOB upon arrival and performed SPT to w/c for bathing and dressing with sit<>stand from w/c at sink.  Pt uses AE appropriately throughout session to assist with bathing and dressing tasks.  Pt requires extra time between task segments secondary to increased pain in left hip with activity.  Pt takes multiple rest breaks throughout session.  Functional transfers and standing balance at min A/steady A.  Pt maintains NWB status throughout session.  Therapy Documentation Precautions:  Precautions Precautions: Fall Restrictions Weight Bearing Restrictions: Yes LLE Weight Bearing: Non weight bearing   Pain: Pain Assessment Pain Assessment: 0-10 Pain Score: 7  Pain Type: Acute pain Pain Location: Hip Pain Orientation: Left Pain Descriptors / Indicators: Aching Pain Onset: With Activity Pain Intervention(s): RN made aware;Repositioned    See FIM for current functional status  Therapy/Group: Individual Therapy  Leroy Libman 12/24/2014, 9:02 AM

## 2014-12-24 NOTE — Progress Notes (Signed)
[place] PHYSICAL MEDICINE & REHABILITATION     PROGRESS NOTE    Subjective/Complaints: Had another good night. Pain getting better. Able to work through pain.  Objective: Vital Signs: Blood pressure 123/60, pulse 88, temperature 97.5 F (36.4 C), temperature source Oral, resp. rate 19, SpO2 100 %. No results found.  Recent Labs  12/22/14 0610 12/24/14 0430  WBC 14.0* 14.2*  HGB 8.8* 8.8*  HCT 27.6* 28.3*  PLT 434* 512*    Recent Labs  12/22/14 0610  NA 138  K 5.0  CL 102  GLUCOSE 142*  BUN 13  CREATININE 0.70  CALCIUM 9.0   CBG (last 3)  No results for input(s): GLUCAP in the last 72 hours.  Wt Readings from Last 3 Encounters:  12/16/14 87.544 kg (193 lb)  08/04/14 87 kg (191 lb 12.8 oz)  01/01/14 87.544 kg (193 lb)    Physical Exam:  Constitutional: She is oriented to person, place, and time. She appears well-developed.  HENT:  Head: Normocephalic.  Eyes: EOM are normal.  Neck: Normal range of motion. Neck supple. No thyromegaly present.  Cardiovascular: Normal rate and regular rhythm.  Respiratory: Effort normal and breath sounds normal. No respiratory distress.  GI: Soft. Bowel sounds are normal. She exhibits no distension.  Neurological: She is alert and oriented to person, place, and time.  Skin:  Left hip incision is dressed appropriately tender , No erythema but serous drainage upper hip incision Motor strength is 5/5 bilateral deltoids, biceps, triceps, grip, 4 minus right hip flexor and knee extensor and ankle dorsal flexor, 2 minus left hip flexor 3 minus knee extensor 4 minus ankle dorsal flexor on the left   Assessment/Plan: 1. Functional deficits secondary to left comminuted and displaced peritrochanteric femur fracture which require 3+ hours per day of interdisciplinary therapy in a comprehensive inpatient rehab setting. Physiatrist is providing close team supervision and 24 hour management of active medical problems listed  below. Physiatrist and rehab team continue to assess barriers to discharge/monitor patient progress toward functional and medical goals. FIM: FIM - Bathing Bathing Steps Patient Completed: Chest, Right Arm, Left Arm, Abdomen, Right upper leg, Left upper leg, Front perineal area, Right lower leg (including foot), Left lower leg (including foot), Buttocks Bathing: 4: Steadying assist  FIM - Upper Body Dressing/Undressing Upper body dressing/undressing steps patient completed: Thread/unthread right sleeve of pullover shirt/dresss, Thread/unthread left sleeve of pullover shirt/dress, Put head through opening of pull over shirt/dress, Pull shirt over trunk Upper body dressing/undressing: 5: Supervision: Safety issues/verbal cues FIM - Lower Body Dressing/Undressing Lower body dressing/undressing steps patient completed: Thread/unthread right underwear leg, Thread/unthread left underwear leg, Pull underwear up/down, Thread/unthread right pants leg, Thread/unthread left pants leg, Pull pants up/down, Don/Doff right shoe, Don/Doff left shoe Lower body dressing/undressing: 4: Min-Patient completed 75 plus % of tasks  FIM - Toileting Toileting steps completed by patient: Adjust clothing prior to toileting, Performs perineal hygiene, Adjust clothing after toileting Toileting Assistive Devices: Grab bar or rail for support Toileting: 4: Steadying assist  FIM - Radio producer Devices: Engineer, civil (consulting), Insurance account manager Transfers: 3-To toilet/BSC: Mod A (lift or lower assist), 3-From toilet/BSC: Mod A (lift or lower assist)  FIM - Control and instrumentation engineer Devices: Walker, Arm rests Bed/Chair Transfer: 4: Bed > Chair or W/C: Min A (steadying Pt. > 75%), 4: Chair or W/C > Bed: Min A (steadying Pt. > 75%)  FIM - Locomotion: Wheelchair Distance: 35 Locomotion: Wheelchair: 1: Travels less than 50 ft  with supervision, cueing or coaxing FIM - Locomotion:  Ambulation Locomotion: Ambulation Assistive Devices: Walker - Rolling Ambulation/Gait Assistance: 4: Min guard Locomotion: Ambulation: 1: Travels less than 50 ft with minimal assistance (Pt.>75%)  Comprehension Comprehension Mode: Auditory Comprehension: 7-Follows complex conversation/direction: With no assist  Expression Expression Mode: Verbal Expression: 7-Expresses complex ideas: With no assist  Social Interaction Social Interaction: 7-Interacts appropriately with others - No medications needed.  Problem Solving Problem Solving: 5-Solves complex 90% of the time/cues < 10% of the time  Memory Memory: 7-Complete Independence: No helper  Medical Problem List and Plan: 1. Functional deficits secondary to left comminuted displaced peritrochanteric femur fracture. Status post ORIF. Nonweightbearing left lower extremity 2. DVT Prophylaxis/Anticoagulation: SQ Lovenox. Monitor for any bleeding episodes.   3. Pain Management: Hydrocodone and Robaxin as needed. Monitor with increased mobility on  -have scheduled robaxin and hydrocodone for now which helped  -ice aggressively to leg 4. Acute blood loss anemia. Follow-up CBC 5. Neuropsych: This patient is capable of making decisions on her own behalf. 6. Skin/Wound Care: Routine skin checks of surgical site 7. Fluids/Electrolytes/Nutrition: Strict I and O follow-up chemistries 8.ESBL?UTI.Primaxin completed/contact precaution 9. Hypertension. Avapro 300 mg daily. Monitor closely with increased mobility 10. Hypothyroidism. Synthroid 11. Leukoctosis: has ranged from 10k to 16k---afebrile  -wound with serous drainage which appears to be decreasing  -observe clinically   LOS (Days) 6 A FACE TO FACE EVALUATION WAS PERFORMED  SWARTZ,ZACHARY T 12/24/2014 8:33 AM

## 2014-12-25 ENCOUNTER — Inpatient Hospital Stay (HOSPITAL_COMMUNITY): Payer: 59

## 2014-12-25 ENCOUNTER — Inpatient Hospital Stay (HOSPITAL_COMMUNITY): Payer: 59 | Admitting: *Deleted

## 2014-12-25 MED ORDER — LOPERAMIDE HCL 2 MG PO CAPS
2.0000 mg | ORAL_CAPSULE | ORAL | Status: DC | PRN
  Filled 2014-12-25: qty 1

## 2014-12-25 NOTE — Progress Notes (Signed)
Occupational Therapy Session Note  Patient Details  Name: [name] MRN: [id number] Date of Birth: 25-Sep-1951  Today's Date: 12/25/2014 OT Individual Time: 1430-1500 OT Individual Time Calculation (min): 30 min    Short Term Goals: Week 1:  OT Short Term Goal 1 (Week 1): Pt will perform LB bathing with AE PRN and min assist. OT Short Term Goal 2 (Week 1): Pt will perform LB dressing with AE and min assist sit to stand. OT Short Term Goal 3 (Week 1): Pt will perform toilet transfers 3/4 attempts with min assist. OT Short Term Goal 4 (Week 1): Pt will perform shower/tub transfers simulated in dry environement with min assist.  Skilled Therapeutic Interventions/Progress Updates:    Pt seen for 1:1 OT session with focus on functional transfers, functional mobility, and education. Pt received sitting in w/c with husband present. Completed stand pivot transfer w/c<>recliner chair with min cues for scooting to front of chair and hand placement. Practiced TTB via ambulating approx 5' into/out of bathroom with min assist as pt reports she will need to ambulate short distances into bathroom. Returned to room and discussed energy conservation techniques with pt and husband. Pt left sitting in w/c with all needs in reach.  Therapy Documentation Precautions:  Precautions Precautions: Fall Restrictions Weight Bearing Restrictions: Yes LUE Weight Bearing: Non weight bearing LLE Weight Bearing: Non weight bearing General:   Vital Signs:   Pain: Pain Assessment Pain Score: 7  Pain Type: Surgical pain Pain Location: Hip Pain Orientation: Left Pain Descriptors / Indicators: Aching Pain Frequency: Intermittent Pain Onset: With Activity Patients Stated Pain Goal: 4 Pain Intervention(s): Medication (See eMAR)  See FIM for current functional status  Therapy/Group: Individual Therapy  Duayne Cal 12/25/2014, 3:11 PM

## 2014-12-25 NOTE — Progress Notes (Signed)
Occupational Therapy Session Note  Patient Details  Name: [name] MRN: [id number] Date of Birth: 1951/01/05  Today's Date: 12/25/2014 OT Individual Time: 0900-1000 OT Individual Time Calculation (min): 60 min    Short Term Goals: Week 1:  OT Short Term Goal 1 (Week 1): Pt will perform LB bathing with AE PRN and min assist. OT Short Term Goal 2 (Week 1): Pt will perform LB dressing with AE and min assist sit to stand. OT Short Term Goal 3 (Week 1): Pt will perform toilet transfers 3/4 attempts with min assist. OT Short Term Goal 4 (Week 1): Pt will perform shower/tub transfers simulated in dry environement with min assist.  Skilled Therapeutic Interventions/Progress Updates:    Pt seen for ADL retraining with focus on functional transfers, sit<>stand, activity tolerance, and standing balance. Pt received sitting in w/c. Completed bathing at sink with min assist for standing balance during peri hygiene. Pt stood approx 1 min with steadying assist while RN changed dressing. Pt utilized AE for LB self-care with increased time. Completed stand pivot transfer w/c<>toilet with use of RW and steadying assist. Pt required min assist for balance and clothing management around waist. Pt adhered to NWB precaution throughout self-care task without cues. At end of session pt left sitting in w/c with all needs in reach.  Therapy Documentation Precautions:  Precautions Precautions: Fall Restrictions Weight Bearing Restrictions: Yes LUE Weight Bearing: Non weight bearing LLE Weight Bearing: Non weight bearing General:   Vital Signs:   Pain: No report of pain  See FIM for current functional status  Therapy/Group: Individual Therapy  Duayne Cal 12/25/2014, 12:17 PM

## 2014-12-25 NOTE — Plan of Care (Signed)
Problem: RH PAIN MANAGEMENT Goal: RH STG PAIN MANAGED AT OR BELOW PT'S PAIN GOAL Pain manage at or below 4 with min assistance  Outcome: Not Progressing Reporting pain as 10

## 2014-12-25 NOTE — Progress Notes (Signed)
[place] PHYSICAL MEDICINE & REHABILITATION     PROGRESS NOTE    Subjective/Complaints: Complains of loose stools. Reported as "soft" generally by rn. Doesn't have urge to go any further. Pain improving.  Objective: Vital Signs: Blood pressure 134/76, pulse 87, temperature 98.2 F (36.8 C), temperature source Oral, resp. rate 18, SpO2 99 %. No results found.  Recent Labs  12/24/14 0430  WBC 14.2*  HGB 8.8*  HCT 28.3*  PLT 512*   No results for input(s): NA, K, CL, GLUCOSE, BUN, CREATININE, CALCIUM in the last 72 hours.  Invalid input(s): CO CBG (last 3)  No results for input(s): GLUCAP in the last 72 hours.  Wt Readings from Last 3 Encounters:  12/16/14 87.544 kg (193 lb)  08/04/14 87 kg (191 lb 12.8 oz)  01/01/14 87.544 kg (193 lb)    Physical Exam:  Constitutional: She is oriented to person, place, and time. She appears well-developed.  HENT:  Head: Normocephalic.  Eyes: EOM are normal.  Neck: Normal range of motion. Neck supple. No thyromegaly present.  Cardiovascular: Normal rate and regular rhythm.  Respiratory: Effort normal and breath sounds normal. No respiratory distress.  GI: Soft. Bowel sounds are normal. She exhibits no distension.  Neurological: She is alert and oriented to person, place, and time.  Skin:  Left hip incision is dressed appropriately tender , No erythema but serous drainage upper hip incision Motor strength is 5/5 bilateral deltoids, biceps, triceps, grip, 4 minus right hip flexor and knee extensor and ankle dorsal flexor, 2 minus left hip flexor 3 minus knee extensor 4 minus ankle dorsal flexor on the left   Assessment/Plan: 1. Functional deficits secondary to left comminuted and displaced peritrochanteric femur fracture which require 3+ hours per day of interdisciplinary therapy in a comprehensive inpatient rehab setting. Physiatrist is providing close team supervision and 24 hour management of active medical problems listed  below. Physiatrist and rehab team continue to assess barriers to discharge/monitor patient progress toward functional and medical goals. FIM: FIM - Bathing Bathing Steps Patient Completed: Chest, Right Arm, Left Arm, Abdomen, Right upper leg, Left upper leg, Front perineal area, Right lower leg (including foot), Left lower leg (including foot) Bathing: 4: Min-Patient completes 8-9 17f 10 parts or 75+ percent  FIM - Upper Body Dressing/Undressing Upper body dressing/undressing steps patient completed: Thread/unthread right sleeve of pullover shirt/dresss, Thread/unthread left sleeve of pullover shirt/dress, Put head through opening of pull over shirt/dress, Pull shirt over trunk Upper body dressing/undressing: 5: Supervision: Safety issues/verbal cues FIM - Lower Body Dressing/Undressing Lower body dressing/undressing steps patient completed: Thread/unthread right underwear leg, Thread/unthread left underwear leg, Pull underwear up/down, Thread/unthread right pants leg, Thread/unthread left pants leg, Pull pants up/down Lower body dressing/undressing: 4: Min-Patient completed 75 plus % of tasks  FIM - Toileting Toileting steps completed by patient: Adjust clothing prior to toileting, Performs perineal hygiene, Adjust clothing after toileting Toileting Assistive Devices: Grab bar or rail for support Toileting: 4: Steadying assist  FIM - Radio producer Devices: Recruitment consultant Transfers: 3-To toilet/BSC: Mod A (lift or lower assist), 3-From toilet/BSC: Mod A (lift or lower assist)  FIM - Control and instrumentation engineer Devices: Walker, Arm rests Bed/Chair Transfer: 5: Supine > Sit: Supervision (verbal cues/safety issues), 4: Sit > Supine: Min A (steadying pt. > 75%/lift 1 leg), 5: Chair or W/C > Bed: Supervision (verbal cues/safety issues), 5: Bed > Chair or W/C: Supervision (verbal cues/safety issues)  FIM - Locomotion: Wheelchair Distance:  125  Locomotion: Wheelchair: 2: Travels 72 - 149 ft with supervision, cueing or coaxing FIM - Locomotion: Ambulation Locomotion: Ambulation Assistive Devices: Administrator Ambulation/Gait Assistance: Not tested (comment) Locomotion: Ambulation: 0: Activity did not occur  Comprehension Comprehension Mode: Auditory Comprehension: 7-Follows complex conversation/direction: With no assist  Expression Expression Mode: Verbal Expression: 7-Expresses complex ideas: With no assist  Social Interaction Social Interaction: 7-Interacts appropriately with others - No medications needed.  Problem Solving Problem Solving: 5-Solves complex 90% of the time/cues < 10% of the time  Memory Memory: 7-Complete Independence: No helper  Medical Problem List and Plan: 1. Functional deficits secondary to left comminuted displaced peritrochanteric femur fracture. Status post ORIF. Nonweightbearing left lower extremity 2. DVT Prophylaxis/Anticoagulation: SQ Lovenox. Monitor for any bleeding episodes.   3. Pain Management: Hydrocodone and Robaxin as needed. Monitor with increased mobility on  -have scheduled robaxin and hydrocodone for now which helped  -ice   to leg 4. Acute blood loss anemia. Follow-up CBC next week 5. Neuropsych: This patient is capable of making decisions on her own behalf. 6. Skin/Wound Care: Routine skin checks of surgical site 7. Fluids/Electrolytes/Nutrition: Strict I and O follow-up chemistries 8.ESBL?UTI.Primaxin completed/contact precaution 9. Hypertension. Avapro 300 mg daily. Monitor closely with increased mobility 10. Hypothyroidism. Synthroid 11. Leukoctosis: has ranged from 10k to 16k---afebrile  -wound   drainage   decreasing  -observe clinically   LOS (Days) 7 A FACE TO FACE EVALUATION WAS PERFORMED  SWARTZ,ZACHARY T 12/25/2014 9:27 AM

## 2014-12-25 NOTE — Care Management Note (Addendum)
[facility] Individual Statement of Services  Patient Name:  [name]  Date:  12/22/2014  Welcome to the [facility].  Our goal is to provide you with an individualized program based on your diagnosis and situation, designed to meet your specific needs.  With this comprehensive rehabilitation program, you will be expected to participate in at least 3 hours of rehabilitation therapies Monday-Friday, with modified therapy programming on the weekends.  Your rehabilitation program will include the following services:  Physical Therapy (PT), Occupational Therapy (OT), 24 hour per day rehabilitation nursing, Therapeutic Recreaction (TR), Case Management (Social Worker), Rehabilitation Medicine, Nutrition Services and Pharmacy Services  Weekly team conferences will be held on Tuesdays to discuss your progress.  Your Social Worker will talk with you frequently to get your input and to update you on team discussions.  Team conferences with you and your family in attendance may also be held.  Expected length of stay: 10-12 days  Overall anticipated outcome: supervision  Depending on your progress and recovery, your program may change. Your Social Worker will coordinate services and will keep you informed of any changes. Your Social Worker's name and contact numbers are listed  below.  The following services may also be recommended but are not provided by the [facility] will be made to provide these services after discharge if needed.  Arrangements include referral to agencies that provide these services.  Your insurance has been verified to be:  [place]   Your primary doctor is:  Dr. Joylene Draft  Pertinent information will be shared with your doctor and your insurance company.  Social Worker:  [place], [place] or ([phone]   Information discussed with and copy given to patient by: Lennart Pall, 12/22/2014, 2:02 PM

## 2014-12-25 NOTE — Progress Notes (Addendum)
Physical Therapy Session Note  Patient Details  Name: [name] MRN: [id number] Date of Birth: Jul 22, 1951  Today's Date: 12/25/2014 PT Individual Time: 0800-0900 and 1130-1200 PT Individual Time Calculation (min): 60 min and 30 min  Short Term Goals: Week 1:  PT Short Term Goal 1 (Week 1): Pt will initiate bed mobility with PT.  PT Short Term Goal 2 (Week 1): Pt will demonstrate sit to stand with CGA.  PT Short Term Goal 3 (Week 1): Pt will transfer w/c to/from bed with CGA and RW.  PT Short Term Goal 4 (Week 1): Pt will ambulate x 15' with RW req min A.  PT Short Term Goal 5 (Week 1): Pt will self propel manual w/c x 100' req SBA and verbal cues.   Skilled Therapeutic Interventions/Progress Updates:    First session: Patient received sitting EOB. Discussion with patient and clarified that husband WILL be renting temporary ramp so that stair negotiation will not be necessary. Also discussed expectations/goals upon discharge for patient to be both ambulatory and at the wheelchair level at home and discussed mod I goals from wheelchair level and supervision goals for ambulation around the home. Patient verbalized understanding and in agreement with recommendations.  Session focused on functional transfers, wheelchair mobility (in controlled and home environments), bed mobility, and gait training. Wheelchair mobility in controlled environment 125'x2 and 24' x1; 25' x1 in home environment (on carpet in ADL apartment) with supervision, increased time and verbal cues for improved efficiency with increasing stroke length. Emphasis on negotiation of confined spaces with obstacles and improved efficiency with turns (forward propulsion with one UE with retro propulsion with contralateral UE). Stand pivot transfers wheelchair<>standard bed with RW and supervision, emphasis on wheelchair parts management, which patient performed with supervision/verbal/visual cues; sit>supine on standard bed with minA  for L LE management, supine>sit with supervision. Patient returned to room and left sitting in wheelchair with all needs within reach.  Second session: Patient received sitting in wheelchair with c/o pain in hip. Discussed/educated on weight shifts/pressure relief while sitting in wheelchair and patient verbalized/demonstrated understanding. Session focused on functional transfers, gait training, and L LE strengthening. Gait training 82' x1 with RW and supervision, negotiating obstacles in crowded gym. Standing with B UE support on table, L hip flexion/ext x20 with supervision. Seated hip flexion x20 to facilitate improved hip flexor strength to lift L LE into bed. Wheelchair mobility x35' with B UE and supervision with increased time. Patient left sitting in wheelchair with all needs within reach and husband present.  Therapy Documentation Precautions:  Precautions Precautions: Fall Restrictions Weight Bearing Restrictions: Yes LUE Weight Bearing: Non weight bearing LLE Weight Bearing: Non weight bearing Pain: Pain Assessment Pain Assessment: 0-10 Pain Score: 2  Pain Type: Acute pain Pain Location: Hip Pain Orientation: Left Pain Intervention(s): Repositioned Locomotion : Ambulation Ambulation/Gait Assistance: Not tested (comment) Wheelchair Mobility Distance: 125   See FIM for current functional status  Therapy/Group: Individual Therapy  Lillia Abed. Najmah Carradine, PT, DPT 12/25/2014, 9:01 AM

## 2014-12-26 MED ORDER — TRAMADOL HCL 50 MG PO TABS
50.0000 mg | ORAL_TABLET | Freq: Four times a day (QID) | ORAL | Status: DC | PRN
  Administered 2014-12-26 – 2014-12-29 (×3): 50 mg via ORAL
  Filled 2014-12-26 (×3): qty 1

## 2014-12-26 NOTE — Plan of Care (Signed)
Problem: RH SKIN INTEGRITY Goal: RH STG ABLE TO PERFORM INCISION/WOUND CARE W/ASSISTANCE STG Able To Perform Incision/Wound Care With min Assistance.  Outcome: Not Progressing Staff provide dressing change @ this time

## 2014-12-26 NOTE — Progress Notes (Signed)
[place] PHYSICAL MEDICINE & REHABILITATION     PROGRESS NOTE    Subjective/Complaints: No complaints. Slept well. Pain getting better. Had a good day with therapy yesterday.  Objective: Vital Signs: Blood pressure 130/63, pulse 81, temperature 98.2 F (36.8 C), temperature source Oral, resp. rate 18, SpO2 100 %. No results found.  Recent Labs  12/24/14 0430  WBC 14.2*  HGB 8.8*  HCT 28.3*  PLT 512*   No results for input(s): NA, K, CL, GLUCOSE, BUN, CREATININE, CALCIUM in the last 72 hours.  Invalid input(s): CO CBG (last 3)  No results for input(s): GLUCAP in the last 72 hours.  Wt Readings from Last 3 Encounters:  12/16/14 87.544 kg (193 lb)  08/04/14 87 kg (191 lb 12.8 oz)  01/01/14 87.544 kg (193 lb)    Physical Exam:  Constitutional: She is oriented to person, place, and time. She appears well-developed.  HENT:  Head: Normocephalic.  Eyes: EOM are normal.  Neck: Normal range of motion. Neck supple. No thyromegaly present.  Cardiovascular: Normal rate and regular rhythm.  Respiratory: Effort normal and breath sounds normal. No respiratory distress.  GI: Soft. Bowel sounds are normal. She exhibits no distension.  Neurological: She is alert and oriented to person, place, and time.  Skin:  Left hip wounds clean without drainage  Motor strength is 5/5 bilateral deltoids, biceps, triceps, grip, 4 minus right hip flexor and knee extensor and ankle dorsal flexor, 2 minus left hip flexor 3 minus knee extensor 4 minus ankle dorsal flexor on the left   Assessment/Plan: 1. Functional deficits secondary to left comminuted and displaced peritrochanteric femur fracture which require 3+ hours per day of interdisciplinary therapy in a comprehensive inpatient rehab setting. Physiatrist is providing close team supervision and 24 hour management of active medical problems listed below. Physiatrist and rehab team continue to assess barriers to discharge/monitor  patient progress toward functional and medical goals. FIM: FIM - Bathing Bathing Steps Patient Completed: Chest, Right Arm, Left Arm, Abdomen, Right upper leg, Left upper leg, Front perineal area, Right lower leg (including foot), Left lower leg (including foot) Bathing: 4: Min-Patient completes 8-9 50f 10 parts or 75+ percent  FIM - Upper Body Dressing/Undressing Upper body dressing/undressing steps patient completed: Thread/unthread right sleeve of pullover shirt/dresss, Thread/unthread left sleeve of pullover shirt/dress, Put head through opening of pull over shirt/dress, Pull shirt over trunk Upper body dressing/undressing: 5: Supervision: Safety issues/verbal cues FIM - Lower Body Dressing/Undressing Lower body dressing/undressing steps patient completed: Thread/unthread right underwear leg, Thread/unthread left underwear leg, Pull underwear up/down, Thread/unthread right pants leg, Thread/unthread left pants leg, Pull pants up/down Lower body dressing/undressing: 4: Min-Patient completed 75 plus % of tasks  FIM - Toileting Toileting steps completed by patient: Adjust clothing prior to toileting, Performs perineal hygiene, Adjust clothing after toileting Toileting Assistive Devices: Grab bar or rail for support Toileting: 4: Steadying assist  FIM - Radio producer Devices: Elevated toilet seat, Insurance account manager Transfers: 4-To toilet/BSC: Min A (steadying Pt. > 75%), 4-From toilet/BSC: Min A (steadying Pt. > 75%)  FIM - Bed/Chair Transfer Bed/Chair Transfer Assistive Devices: Walker, Arm rests Bed/Chair Transfer: 5: Chair or W/C > Bed: Supervision (verbal cues/safety issues), 5: Bed > Chair or W/C: Supervision (verbal cues/safety issues)  FIM - Locomotion: Wheelchair Distance: 30 Locomotion: Wheelchair: 1: Travels less than 50 ft with supervision, cueing or coaxing FIM - Locomotion: Ambulation Locomotion: Ambulation Assistive Devices: Astronomer Ambulation/Gait Assistance: 5: Supervision Locomotion: Ambulation: 1: [employer]  less than 50 ft with supervision/safety issues  Comprehension Comprehension Mode: Auditory Comprehension: 7-Follows complex conversation/direction: With no assist  Expression Expression Mode: Verbal Expression: 7-Expresses complex ideas: With no assist  Social Interaction Social Interaction: 7-Interacts appropriately with others - No medications needed.  Problem Solving Problem Solving: 5-Solves complex 90% of the time/cues < 10% of the time  Memory Memory: 7-Complete Independence: No helper  Medical Problem List and Plan: 1. Functional deficits secondary to left comminuted displaced peritrochanteric femur fracture. Status post ORIF. Nonweightbearing left lower extremity 2. DVT Prophylaxis/Anticoagulation: SQ Lovenox. Monitor for any bleeding episodes.   3. Pain Management: Hydrocodone and Robaxin as needed. Monitor with increased mobility on  - scheduled robaxin and hydrocodone for now   -ice   to leg 4. Acute blood loss anemia. Follow-up CBC next week 5. Neuropsych: This patient is capable of making decisions on her own behalf. 6. Skin/Wound Care: Routine skin checks of surgical site 7. Fluids/Electrolytes/Nutrition: Strict I and O follow-up chemistries 8.ESBL?UTI.Primaxin completed/contact precaution 9. Hypertension. Avapro 300 mg daily. Monitor closely with increased mobility 10. Hypothyroidism. Synthroid 11. Leukoctosis: has ranged from 10k to 16k---afebrile  -wound   drainage has stopped--dc staples soon  -observe clinically  -recheck cbc monday   LOS (Days) 8 A FACE TO FACE EVALUATION WAS PERFORMED  SWARTZ,ZACHARY T 12/26/2014 8:28 AM

## 2014-12-26 NOTE — Plan of Care (Signed)
Problem: RH SKIN INTEGRITY Goal: RH STG ABLE TO PERFORM INCISION/WOUND CARE W/ASSISTANCE STG Able To Perform Incision/Wound Care With min Assistance.  Outcome: Not Applicable Date Met:  18/33/58 Staff perform dressing change @ this time.

## 2014-12-27 ENCOUNTER — Inpatient Hospital Stay (HOSPITAL_COMMUNITY): Payer: 59 | Admitting: Occupational Therapy

## 2014-12-27 NOTE — Progress Notes (Signed)
Occupational Therapy Session Note  Patient Details  Name: Donelle Hise MRN: [id number] Date of Birth: 1951-10-10  Today's Date: 12/27/2014 OT Individual Time: 5400-8676 OT Individual Time Calculation (min): 60 min    Short Term Goals: Week 1:  OT Short Term Goal 1 (Week 1): Pt will perform LB bathing with AE PRN and min assist. OT Short Term Goal 2 (Week 1): Pt will perform LB dressing with AE and min assist sit to stand. OT Short Term Goal 3 (Week 1): Pt will perform toilet transfers 3/4 attempts with min assist. OT Short Term Goal 4 (Week 1): Pt will perform shower/tub transfers simulated in dry environement with min assist.  Skilled Therapeutic Interventions/Progress Updates:  Upon entering the room, pt reporting 8/10 L hip pain with RN present to give medication. Pt received seated in wheelchair. Skilled OT session with focus on self care, maintaining weight bearing status, functional transfers, STS, and use of AE. Pt reporting needing to toilet and able to propel wheelchair into bathroom with min- mod verbal cues for set up of wheelchair. Squat pivot transfer wheelchair <> toilet with use of grab bar with min A while maintaining weight bearing precautions. Pt required steady assist during toileting for clothing management and hygiene. Pt then propelled self to sink side for bathing and dressing. Pt utilized LH reacher and LH sponge for LB dressing with min verbal cues for proper technique. Pt performed 3 STS with Min A and use of RW. Pt with increasing fatigue throughout session and required 2 rest breaks with self care tasks. Pt remained seated in wheelchair with call bell within reach upon exiting the room.   Therapy Documentation Precautions:  Precautions Precautions: Fall Restrictions Weight Bearing Restrictions: Yes LUE Weight Bearing: Non weight bearing LLE Weight Bearing: Non weight bearing Pain: Pain Assessment Pain Score: 8  Pain Type: Acute pain Pain Location: Hip Pain  Orientation: Left Pain Descriptors / Indicators: Aching Pain Onset: On-going Pain Intervention(s): Medication (See eMAR)  See FIM for current functional status  Therapy/Group: Individual Therapy  Phineas Semen 12/27/2014, 2:24 PM

## 2014-12-27 NOTE — Progress Notes (Signed)
[place] PHYSICAL MEDICINE & REHABILITATION     PROGRESS NOTE    Subjective/Complaints: Pain gradually improving. Sleeping well.   Objective: Vital Signs: Blood pressure 124/60, pulse 82, temperature 98 F (36.7 C), temperature source Oral, resp. rate 18, SpO2 100 %. No results found. No results for input(s): WBC, HGB, HCT, PLT in the last 72 hours. No results for input(s): NA, K, CL, GLUCOSE, BUN, CREATININE, CALCIUM in the last 72 hours.  Invalid input(s): CO CBG (last 3)  No results for input(s): GLUCAP in the last 72 hours.  Wt Readings from Last 3 Encounters:  12/16/14 87.544 kg (193 lb)  08/04/14 87 kg (191 lb 12.8 oz)  01/01/14 87.544 kg (193 lb)    Physical Exam:  Constitutional: She is oriented to person, place, and time. She appears well-developed.  HENT:  Head: Normocephalic.  Eyes: EOM are normal.  Neck: Normal range of motion. Neck supple. No thyromegaly present.  Cardiovascular: Normal rate and regular rhythm.  Respiratory: Effort normal and breath sounds normal. No respiratory distress.  GI: Soft. Bowel sounds are normal. She exhibits no distension.  Neurological: She is alert and oriented to person, place, and time.  Skin:  Left hip wounds clean without drainage  Motor strength is 5/5 bilateral deltoids, biceps, triceps, grip, 4 minus right hip flexor and knee extensor and ankle dorsal flexor, 2  left hip flexor 3 minus knee extensor 4   ankle dorsal flexor on the left   Assessment/Plan: 1. Functional deficits secondary to left comminuted and displaced peritrochanteric femur fracture which require 3+ hours per day of interdisciplinary therapy in a comprehensive inpatient rehab setting. Physiatrist is providing close team supervision and 24 hour management of active medical problems listed below. Physiatrist and rehab team continue to assess barriers to discharge/monitor patient progress toward functional and medical goals. FIM: FIM -  Bathing Bathing Steps Patient Completed: Chest, Right Arm, Left Arm, Abdomen, Right upper leg, Left upper leg, Front perineal area, Right lower leg (including foot), Left lower leg (including foot) Bathing: 4: Min-Patient completes 8-9 21f 10 parts or 75+ percent  FIM - Upper Body Dressing/Undressing Upper body dressing/undressing steps patient completed: Thread/unthread right sleeve of pullover shirt/dresss, Thread/unthread left sleeve of pullover shirt/dress, Put head through opening of pull over shirt/dress, Pull shirt over trunk Upper body dressing/undressing: 5: Supervision: Safety issues/verbal cues FIM - Lower Body Dressing/Undressing Lower body dressing/undressing steps patient completed: Thread/unthread right underwear leg, Thread/unthread left underwear leg, Pull underwear up/down, Thread/unthread right pants leg, Thread/unthread left pants leg, Pull pants up/down Lower body dressing/undressing: 4: Min-Patient completed 75 plus % of tasks  FIM - Toileting Toileting steps completed by patient: Adjust clothing prior to toileting, Performs perineal hygiene, Adjust clothing after toileting Toileting Assistive Devices: Grab bar or rail for support Toileting: 4: Steadying assist  FIM - Radio producer Devices: Elevated toilet seat, Insurance account manager Transfers: 4-To toilet/BSC: Min A (steadying Pt. > 75%), 4-From toilet/BSC: Min A (steadying Pt. > 75%)  FIM - Bed/Chair Transfer Bed/Chair Transfer Assistive Devices: Walker, Arm rests Bed/Chair Transfer: 5: Chair or W/C > Bed: Supervision (verbal cues/safety issues), 5: Bed > Chair or W/C: Supervision (verbal cues/safety issues)  FIM - Locomotion: Wheelchair Distance: 30 Locomotion: Wheelchair: 1: Travels less than 50 ft with supervision, cueing or coaxing FIM - Locomotion: Ambulation Locomotion: Ambulation Assistive Devices: Administrator Ambulation/Gait Assistance: 5: Supervision Locomotion: Ambulation: 1:  Travels less than 50 ft with supervision/safety issues  Comprehension Comprehension Mode: Auditory Comprehension: 7-Follows  complex conversation/direction: With no assist  Expression Expression Mode: Verbal Expression: 7-Expresses complex ideas: With no assist  Social Interaction Social Interaction: 7-Interacts appropriately with others - No medications needed.  Problem Solving Problem Solving: 5-Solves complex 90% of the time/cues < 10% of the time  Memory Memory: 7-Complete Independence: No helper  Medical Problem List and Plan: 1. Functional deficits secondary to left comminuted displaced peritrochanteric femur fracture. Status post ORIF. Nonweightbearing left lower extremity  -dc staples soon 2. DVT Prophylaxis/Anticoagulation: SQ Lovenox. Monitor for any bleeding episodes.   3. Pain Management: Hydrocodone and Robaxin as needed. Monitor with increased mobility on  - scheduled robaxin and hydrocodone for now   -ice   to leg 4. Acute blood loss anemia. Cbc monday 5. Neuropsych: This patient is capable of making decisions on her own behalf. 6. Skin/Wound Care: Routine skin checks of surgical site 7. Fluids/Electrolytes/Nutrition: Strict I and O follow-up chemistries 8.ESBL?UTI.Primaxin completed/contact precaution 9. Hypertension. Avapro 300 mg daily. Monitor closely with increased mobility 10. Hypothyroidism. Synthroid 11. Leukoctosis: has ranged from 10k to 16k---afebrile  -wound   drainage has stopped--dc staples soon  -observe clinically  -recheck cbc monday   LOS (Days) 9 A FACE TO FACE EVALUATION WAS PERFORMED  SWARTZ,ZACHARY T 12/27/2014 8:31 AM

## 2014-12-28 ENCOUNTER — Inpatient Hospital Stay (HOSPITAL_COMMUNITY): Payer: 59 | Admitting: *Deleted

## 2014-12-28 ENCOUNTER — Inpatient Hospital Stay (HOSPITAL_COMMUNITY): Payer: 59 | Admitting: Occupational Therapy
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
## 2014-12-28 NOTE — Progress Notes (Signed)
[place] PHYSICAL MEDICINE & REHABILITATION     PROGRESS NOTE    Subjective/Complaints: Had questions about recovery and course of pain   Objective: Vital Signs: Blood pressure 115/44, pulse 87, temperature 98.1 F (36.7 C), temperature source Oral, resp. rate 17, SpO2 98 %. No results found. No results for input(s): WBC, HGB, HCT, PLT in the last 72 hours. No results for input(s): NA, K, CL, GLUCOSE, BUN, CREATININE, CALCIUM in the last 72 hours.  Invalid input(s): CO CBG (last 3)  No results for input(s): GLUCAP in the last 72 hours.  Wt Readings from Last 3 Encounters:  12/16/14 87.544 kg (193 lb)  08/04/14 87 kg (191 lb 12.8 oz)  01/01/14 87.544 kg (193 lb)    Physical Exam:  Constitutional: She is oriented to person, place, and time. She appears well-developed.  HENT:  Head: Normocephalic.  Eyes: EOM are normal.  Neck: Normal range of motion. Neck supple. No thyromegaly present.  Cardiovascular: Normal rate and regular rhythm.  Respiratory: Effort normal and breath sounds normal. No respiratory distress.  GI: Soft. Bowel sounds are normal. She exhibits no distension.  Neurological: She is alert and oriented to person, place, and time.  Skin:  Left hip wounds clean without drainage  Motor strength is 5/5 bilateral deltoids, biceps, triceps, grip, 4 minus right hip flexor and knee extensor and ankle dorsal flexor, 2  left hip flexor 3 minus knee extensor 4   ankle dorsal flexor on the left   Assessment/Plan: 1. Functional deficits secondary to left comminuted and displaced peritrochanteric femur fracture which require 3+ hours per day of interdisciplinary therapy in a comprehensive inpatient rehab setting. Physiatrist is providing close team supervision and 24 hour management of active medical problems listed below. Physiatrist and rehab team continue to assess barriers to discharge/monitor patient progress toward functional and medical goals. FIM: FIM -  Bathing Bathing Steps Patient Completed: Chest, Right Arm, Left Arm, Abdomen, Front perineal area, Right upper leg, Left upper leg, Right lower leg (including foot), Left lower leg (including foot) Bathing: 4: Min-Patient completes 8-9 12f 10 parts or 75+ percent  FIM - Upper Body Dressing/Undressing Upper body dressing/undressing steps patient completed: Thread/unthread right sleeve of pullover shirt/dresss, Thread/unthread left sleeve of pullover shirt/dress, Put head through opening of pull over shirt/dress, Pull shirt over trunk Upper body dressing/undressing: 5: Set-up assist to: Obtain clothing/put away FIM - Lower Body Dressing/Undressing Lower body dressing/undressing steps patient completed: Thread/unthread right underwear leg, Thread/unthread left underwear leg, Pull underwear up/down, Thread/unthread right pants leg, Thread/unthread left pants leg, Pull pants up/down Lower body dressing/undressing: 4: Min-Patient completed 75 plus % of tasks  FIM - Toileting Toileting steps completed by patient: Adjust clothing prior to toileting, Performs perineal hygiene, Adjust clothing after toileting Toileting Assistive Devices: Grab bar or rail for support Toileting: 4: Steadying assist  FIM - Radio producer Devices: Elevated toilet seat, Insurance account manager Transfers: 4-To toilet/BSC: Min A (steadying Pt. > 75%), 4-From toilet/BSC: Min A (steadying Pt. > 75%)  FIM - Bed/Chair Transfer Bed/Chair Transfer Assistive Devices: Walker, Arm rests Bed/Chair Transfer: 5: Sit > Supine: Supervision (verbal cues/safety issues), 5: Chair or W/C > Bed: Supervision (verbal cues/safety issues), 5: Bed > Chair or W/C: Supervision (verbal cues/safety issues)  FIM - Locomotion: Wheelchair Distance: 120 Locomotion: Wheelchair: 2: Travels 50 - 149 ft with supervision, cueing or coaxing FIM - Locomotion: Ambulation Locomotion: Ambulation Assistive Devices: Astronomer Ambulation/Gait Assistance: 5: Supervision Locomotion: Ambulation: 1: Travels less  than 50 ft with supervision/safety issues  Comprehension Comprehension Mode: Auditory Comprehension: 7-Follows complex conversation/direction: With no assist  Expression Expression Mode: Verbal Expression: 7-Expresses complex ideas: With no assist  Social Interaction Social Interaction: 7-Interacts appropriately with others - No medications needed.  Problem Solving Problem Solving: 5-Solves complex 90% of the time/cues < 10% of the time  Memory Memory: 7-Complete Independence: No helper  Medical Problem List and Plan: 1. Functional deficits secondary to left comminuted displaced peritrochanteric femur fracture. Status post ORIF. Nonweightbearing left lower extremity  -dc staples soon 2. DVT Prophylaxis/Anticoagulation: SQ Lovenox. Monitor for any bleeding episodes.   3. Pain Management: Hydrocodone and Robaxin as needed. Monitor with increased mobility on  - scheduled robaxin and hydrocodone for now   -ice   to leg 4. Acute blood loss anemia. Cbc monday 5. Neuropsych: This patient is capable of making decisions on her own behalf. 6. Skin/Wound Care: Routine skin checks of surgical site 7. Fluids/Electrolytes/Nutrition: Strict I and O follow-up chemistries 8.ESBL?UTI.Primaxin completed/contact precaution 9. Hypertension. Avapro 300 mg daily. Monitor closely with increased mobility 10. Hypothyroidism. Synthroid 11. Leukoctosis: has ranged from 10k to 16k---afebrile  -wound   drainage has stopped--dc staples soon  -observe clinically  -recheck cbc monday   LOS (Days) 10 A FACE TO FACE EVALUATION WAS PERFORMED  [name] T 12/28/2014 8:31 AM

## 2014-12-28 NOTE — Progress Notes (Signed)
Physical Therapy Weekly Progress Note  Patient Details  Name: [name] MRN: [id number] Date of Birth: 09/29/1951  Beginning of progress report period: December 20, 2014 End of progress report period: December 28, 2014  Today's Date: 12/28/2014 PT Individual Time: 0800-0900 and 1300-1400 PT Individual Time Calculation (min): 60 min and 60 min  Patient has met 5 of 5 short term goals. Patient has made good progress during first reporting period on rehab. Patient is functioning at overall supervision level with all aspects of mobility, including, bed mobility, wheelchair mobility, functional transfers, gait training up to ~38', and standing dynamic balance. Patient demonstrates good safety awareness, but anxiety/decreased confidence about abilities continues to limit her at times. Patient's planned discharge date is 12/31/14 and anticipate patient will reach supervision-mod I goals at that time.  Patient continues to demonstrate the following deficits: decreased activity tolerance, decreased balance and balance strategies, decreased ability to compensate for deficits, decreased functional use of L LE, decreased coordination, decreased overall functional mobility and therefore will continue to benefit from skilled PT intervention to enhance overall performance with activity tolerance, balance, postural control, ability to compensate for deficits, functional use of  left lower extremity, coordination and knowledge of precautions.  Patient progressing toward long term goals..  Continue plan of care.  PT Short Term Goals Week 1:  PT Short Term Goal 1 (Week 1): Pt will initiate bed mobility with PT.  PT Short Term Goal 1 - Progress (Week 1): Met PT Short Term Goal 2 (Week 1): Pt will demonstrate sit to stand with CGA.  PT Short Term Goal 2 - Progress (Week 1): Met PT Short Term Goal 3 (Week 1): Pt will transfer w/c to/from bed with CGA and RW.  PT Short Term Goal 3 - Progress (Week 1): Met PT Short  Term Goal 4 (Week 1): Pt will ambulate x 15' with RW req min A.  PT Short Term Goal 4 - Progress (Week 1): Met PT Short Term Goal 5 (Week 1): Pt will self propel manual w/c x 100' req SBA and verbal cues.  PT Short Term Goal 5 - Progress (Week 1): Met Week 2:  PT Short Term Goal 1 (Week 2): STGs=LTGs  Skilled Therapeutic Interventions/Progress Updates:    AM Session: Patient received sitting in wheelchair. Session focused on wheelchair mobility, functional transfers, gait training, and LE strengthening. Patient performing at overall supervision level for all mobility during session, including several stand pivot transfers. Patient propelled wheelchair >150' with B UEs and supervision/verbal cues for increased efficiency; increased time required. Functional transfers and gait 18' x1 and 17' x1 in controlled environment with RW and supervision. Emphasis on self-monitoring for rest breaks and wheelchair parts management (with min verbal cues).  Semi-reclined on mat with wedge, LE therex: ankle pumps, quad sets with 3" hold, glute sets with 3" hold, heel slides and hip abd (with slideboard and pillow case to decrease friction)/AAROM heel slides on L LE, all reps 2x10. Patient returned to room and left sitting in wheelchair with all needs within reach.  PM Session: Patient received sitting in wheelchair. Session focused on wheelchair mobility, functional transfers, gait training, and LE strengthening. Patient performing at overall supervision level for all mobility during session, including several stand pivot transfers. Patient propelled wheelchair >150' with B UEs and supervision/verbal cues for increased efficiency; increased time required. Functional transfers and gait 28' x1 in controlled environment with RW and supervision. Emphasis on self-monitoring for rest breaks and wheelchair parts management (with min verbal   cues).  NuStep Level 4>Level 2>Level 1 with B UEs and R LE to increase activity  tolerance and coordination x10' with several rest breaks throughout. Gait training 15' x1 in home environment (ADL apartment) on carpet and furniture transfers from low, cushioned sofa with RW and supervision. Patient returned to room and left sitting in wheelchair with all needs within reach, husband present.  Therapy Documentation Precautions:  Precautions Precautions: Fall Restrictions Weight Bearing Restrictions: Yes LUE Weight Bearing:  (Patient is NOT NWB L UE!!!) LLE Weight Bearing: Non weight bearing Pain: Pain Assessment Pain Assessment: 0-10 Pain Score: 6  Pain Type: Acute pain Pain Location: Hip Pain Orientation: Left Pain Descriptors / Indicators: Aching Pain Onset: On-going Pain Intervention(s): RN made aware;Repositioned;Ambulation/increased activity Locomotion : Ambulation Ambulation/Gait Assistance: 5: Supervision Wheelchair Mobility Distance: 120   See FIM for current functional status  Therapy/Group: Individual Therapy  Bridget S Ripa  Bridget S. Ripa, PT, DPT 12/28/2014, 8:37 AM   

## 2014-12-29 ENCOUNTER — Encounter (HOSPITAL_COMMUNITY): Payer: 59

## 2014-12-29 ENCOUNTER — Inpatient Hospital Stay (HOSPITAL_COMMUNITY): Payer: 59

## 2014-12-29 LAB — CBC
HEMATOCRIT: 31.9 % — AB (ref 36.0–46.0)
Hemoglobin: 10 g/dL — ABNORMAL LOW (ref 12.0–15.0)
MCH: 28.4 pg (ref 26.0–34.0)
MCHC: 31.3 g/dL (ref 30.0–36.0)
MCV: 90.6 fL (ref 78.0–100.0)
Platelets: 614 10*3/uL — ABNORMAL HIGH (ref 150–400)
RBC: 3.52 MIL/uL — AB (ref 3.87–5.11)
RDW: 15.8 % — ABNORMAL HIGH (ref 11.5–15.5)
WBC: 13.4 10*3/uL — ABNORMAL HIGH (ref 4.0–10.5)

## 2014-12-29 NOTE — Progress Notes (Signed)
Occupational Therapy Weekly Progress Note  Patient Details  Name: [name] MRN: [id number] Date of Birth: 1951-07-11  Beginning of progress report period: December 20, 2014 End of progress report period: December 29, 2014   Patient has met 4 of 4 short term goals.  Pt has made steady progress with BADLs since admission.  Pt is currently steady A for LB bathing and dressing and supervision for functional transfers with RW.  Pt continues to require extra time to complete tasks with multiple rest breaks.  Pt's husband has been present to observe therapy and is making arrangements to hire assistance during the day. Patient continues to demonstrate the following deficits: decreased balance, decreased activity tolerance, decreased strength, decreased coordination, decreased ability to compensate for deficits and therefore will continue to benefit from skilled OT intervention to enhance overall performance with BADL.  Patient progressing toward long term goals..  Continue plan of care.  OT Short Term Goals Week 1:  OT Short Term Goal 1 (Week 1): Pt will perform LB bathing with AE PRN and min assist. OT Short Term Goal 1 - Progress (Week 1): Met OT Short Term Goal 2 (Week 1): Pt will perform LB dressing with AE and min assist sit to stand. OT Short Term Goal 2 - Progress (Week 1): Met OT Short Term Goal 3 (Week 1): Pt will perform toilet transfers 3/4 attempts with min assist. OT Short Term Goal 3 - Progress (Week 1): Met OT Short Term Goal 4 (Week 1): Pt will perform shower/tub transfers simulated in dry environement with min assist. OT Short Term Goal 4 - Progress (Week 1): Met Week 2:  OT Short Term Goal 1 (Week 2): STG=LTG secondary to pending D/C  Skilled Therapeutic Interventions/Progress Updates:      Therapy Documentation Precautions:  Precautions Precautions: Fall Restrictions Weight Bearing Restrictions: Yes LUE Weight Bearing:  (Patient is NOT NWB L UE!!!) LLE Weight  Bearing: Non weight bearing  See FIM for current functional status   Leroy Libman 12/29/2014, 2:29 PM

## 2014-12-29 NOTE — Progress Notes (Signed)
[place] PHYSICAL MEDICINE & REHABILITATION     PROGRESS NOTE    Subjective/Complaints: Had some pain overnight but it was tolerable. Able to sleep. No fever, chills, n/v/d  Objective: Vital Signs: Blood pressure 122/57, pulse 83, temperature 98.7 F (37.1 C), temperature source Oral, resp. rate 16, SpO2 100 %. No results found.  Recent Labs  12/29/14 0501  WBC 13.4*  HGB 10.0*  HCT 31.9*  PLT 614*   No results for input(s): NA, K, CL, GLUCOSE, BUN, CREATININE, CALCIUM in the last 72 hours.  Invalid input(s): CO CBG (last 3)  No results for input(s): GLUCAP in the last 72 hours.  Wt Readings from Last 3 Encounters:  12/16/14 87.544 kg (193 lb)  08/04/14 87 kg (191 lb 12.8 oz)  01/01/14 87.544 kg (193 lb)    Physical Exam:  Constitutional: She is oriented to person, place, and time. She appears well-developed.  HENT:  Head: Normocephalic.  Eyes: EOM are normal.  Neck: Normal range of motion. Neck supple. No thyromegaly present.  Cardiovascular: Normal rate and regular rhythm.  Respiratory: Effort normal and breath sounds normal. No respiratory distress.  GI: Soft. Bowel sounds are normal. She exhibits no distension.  Neurological: She is alert and oriented to person, place, and time.  Skin:  Left hip wounds clean without drainage  Motor strength is 5/5 bilateral deltoids, biceps, triceps, grip, 4 minus right hip flexor and knee extensor and ankle dorsal flexor, 2  left hip flexor 3 minus knee extensor 4   ankle dorsal flexor on the left   Assessment/Plan: 1. Functional deficits secondary to left comminuted and displaced peritrochanteric femur fracture which require 3+ hours per day of interdisciplinary therapy in a comprehensive inpatient rehab setting. Physiatrist is providing close team supervision and 24 hour management of active medical problems listed below. Physiatrist and rehab team continue to assess barriers to discharge/monitor patient  progress toward functional and medical goals. FIM: FIM - Bathing Bathing Steps Patient Completed: Chest, Right Arm, Left Arm, Abdomen, Front perineal area, Left upper leg, Right upper leg Bathing: 3: Mod-Patient completes 5-7 2f 10 parts or 50-74%  FIM - Upper Body Dressing/Undressing Upper body dressing/undressing steps patient completed: Thread/unthread left sleeve of pullover shirt/dress, Pull shirt over trunk, Put head through opening of pull over shirt/dress, Thread/unthread right sleeve of pullover shirt/dresss Upper body dressing/undressing: 5: Set-up assist to: Obtain clothing/put away FIM - Lower Body Dressing/Undressing Lower body dressing/undressing steps patient completed: Thread/unthread right underwear leg, Thread/unthread right pants leg, Don/Doff right sock (use of reacher and sock aide increase her independence but she says she may not buy it for home.   she did not wear shoes today) Lower body dressing/undressing: 3: Mod-Patient completed 50-74% of tasks  FIM - Toileting Toileting steps completed by patient: Adjust clothing prior to toileting, Performs perineal hygiene Toileting Assistive Devices: Grab bar or rail for support Toileting: 3: Mod-Patient completed 2 of 3 steps  FIM - Radio producer Devices: Elevated toilet seat, Insurance account manager Transfers: 0-Activity did not occur  FIM - Control and instrumentation engineer Devices: Environmental consultant, Arm rests Bed/Chair Transfer: 5: Sit > Supine: Supervision (verbal cues/safety issues), 5: Chair or W/C > Bed: Supervision (verbal cues/safety issues), 5: Bed > Chair or W/C: Supervision (verbal cues/safety issues)  FIM - Locomotion: Wheelchair Distance: 120 Locomotion: Wheelchair: 2: Travels 50 - 149 ft with supervision, cueing or coaxing FIM - Locomotion: Ambulation Locomotion: Ambulation Assistive Devices: Administrator Ambulation/Gait Assistance: 5: Supervision Locomotion: Ambulation:  1:  Travels less than 50 ft with supervision/safety issues  Comprehension Comprehension Mode: Auditory Comprehension: 7-Follows complex conversation/direction: With no assist  Expression Expression Mode: Verbal Expression: 7-Expresses complex ideas: With no assist  Social Interaction Social Interaction: 7-Interacts appropriately with others - No medications needed.  Problem Solving Problem Solving: 7-Solves complex problems: Recognizes & self-corrects  Memory Memory: 7-Complete Independence: No helper  Medical Problem List and Plan: 1. Functional deficits secondary to left comminuted displaced peritrochanteric femur fracture. Status post ORIF. Nonweightbearing left lower extremity  -dc staples today 2. DVT Prophylaxis/Anticoagulation: SQ Lovenox. Monitor for any bleeding episodes.   3. Pain Management: Hydrocodone and Robaxin as needed. Monitor with increased mobility on  - scheduled robaxin and hydrocodone for now   -ice   to leg 4. Acute blood loss anemia. hgb 10.0 5. Neuropsych: This patient is capable of making decisions on her own behalf. 6. Skin/Wound Care: Routine skin checks of surgical site 7. Fluids/Electrolytes/Nutrition: Strict I and O follow-up chemistries 8.ESBL?UTI.Primaxin completed/contact precaution 9. Hypertension. Avapro 300 mg daily. Monitor closely with increased mobility 10. Hypothyroidism. Synthroid 11. Leukoctosis: has ranged from 10k to 16k---afebrile  -wound   drainage has stopped--dc staples soon  -observe clinically  -13.4 today   LOS (Days) 11 A FACE TO FACE EVALUATION WAS PERFORMED  SWARTZ,ZACHARY T 12/29/2014 7:44 AM

## 2014-12-29 NOTE — Progress Notes (Signed)
Physical Therapy Session Note  Patient Details  Name: [name] MRN: [id number] Date of Birth: 04-16-1951  Today's Date: 12/29/2014 PT Individual Time: 0800-0900 PT Individual Time Calculation (min): 60 min   Short Term Goals: Week 2:  PT Short Term Goal 1 (Week 2): STGs=LTGs  Skilled Therapeutic Interventions/Progress Updates:  1:1. Pt received sitting in w/c, ready for therapy. Focus this session on functional endurance, functional transfers and mobility as well as therex. Pt req overall supervision for w/c propulsion 200'x1 and 150'x2 with B UE at very slow but steady pace, min cues for obstacle negotiation as well as management of leg rests. Pt req supervision for t/f sit<>stand and SPT w/c<>tx mat with excellent ability to maintain NWB L LE. Pt req min A for completion of car t/f to manage L LE in/out of car with min cues for safety and technique. Pt req supervision for t/f sup<>sit on tx mat.   Pt instructed on supine and seated therex to target L LE strength and ROM, exercises included 1-2 reps of: ankle pumps, quad sets, glute sets, hip abd/add, LAQ. Handout provided. Overall fair tolerance due to pain.   Pt repoting 7/10 pain in L hip at end of session, RN made aware. Ice pack in place for additional pain management, instructed on benefit as well as to limit use to 58min. Pt left sitting in w/c at end of session w/ all needs in reach.    Therapy Documentation Precautions:  Precautions Precautions: Fall Restrictions Weight Bearing Restrictions: Yes LUE Weight Bearing:  (Patient is NOT NWB L UE!!!) LLE Weight Bearing: Non weight bearing Pain: Pain Assessment Pain Assessment: 0-10 Pain Score: 8  Pain Type: Acute pain Pain Location: Hip Pain Orientation: Left Pain Descriptors / Indicators: Aching Pain Frequency: Intermittent Pain Onset: On-going Pain Intervention(s): Medication (See eMAR)  See FIM for current functional status  Therapy/Group: Individual  Therapy  Gilmore Laroche 12/29/2014, 12:04 PM

## 2014-12-29 NOTE — Progress Notes (Signed)
Occupational Therapy Note  Patient Details  Name: [name] MRN: [id number] Date of Birth: 1951-01-23  Today's Date: 12/29/2014 OT Individual Time: 1300-1400 OT Individual Time Calculation (min): 60 min   Pt c/o 3/10 pain in left hip; RN aware and repositioned Individual Therapy  Pt initially practiced tub bench transfers X 2 at supervision level.  Pt amb with RW into bathroom to practice transfers in ADL bathroom.  Pt transitioned to therapy gym and engaged in dynamic standing tasks, functional amb with RW, and w/c mobility tasks.  Pt required multiple rest breaks throughout session.  Focus on activity tolerance, sit<>stand, standing balance, w/c mobility, functional amb with RW, and safety awareness.  [name] [facility] 12/29/2014, 2:12 PM

## 2014-12-29 NOTE — Progress Notes (Signed)
Occupational Therapy Session Note  Patient Details  Name: [name] MRN: [id number] Date of Birth: Jan 16, 1951  Today's Date: 12/29/2014 OT Individual Time: 0700-0800 OT Individual Time Calculation (min): 60 min    Short Term Goals: Week 1:  OT Short Term Goal 1 (Week 1): Pt will perform LB bathing with AE PRN and min assist. OT Short Term Goal 2 (Week 1): Pt will perform LB dressing with AE and min assist sit to stand. OT Short Term Goal 3 (Week 1): Pt will perform toilet transfers 3/4 attempts with min assist. OT Short Term Goal 4 (Week 1): Pt will perform shower/tub transfers simulated in dry environement with min assist.  Skilled Therapeutic Interventions/Progress Updates:    Pt resting in bed upon arrival and agreeable to bathing and dressing with sit<>stand from w/c at sink.  Pt sat EOB and completed stand pivot transfer bed->w/c with close supervision.  Pt completed bathing and dressing tasks with sit<>stand from w/c at sink, requiring steady A only when standing to pull up pants.  Pt used AE appropriately to complete LB bathing and dressing tasks.  Pt requires extra time to complete tasks and multiple rest breaks throughout session. Focus on activity tolerance, sit<>stand, standing balance, and safety awareness.  Therapy Documentation Precautions:  Precautions Precautions: Fall Restrictions Weight Bearing Restrictions: Yes LUE Weight Bearing:  (Patient is NOT NWB L UE!!!) LLE Weight Bearing: Non weight bearing Pain: Pain Assessment Pain Assessment: 0-10 Pain Score: 2  Pain Type: Acute pain;Surgical pain Pain Location: Hip Pain Orientation: Left Pain Descriptors / Indicators: Aching Pain Frequency: Constant Pain Onset: On-going Patients Stated Pain Goal: 2 Pain Intervention(s):Rn aware, repositioned ADL:    See FIM for current functional status  Therapy/Group: Individual Therapy  Leroy Libman 12/29/2014, 8:03 AM

## 2014-12-30 ENCOUNTER — Inpatient Hospital Stay (HOSPITAL_COMMUNITY): Payer: 59

## 2014-12-30 ENCOUNTER — Encounter (HOSPITAL_COMMUNITY): Payer: 59

## 2014-12-30 LAB — URINE MICROSCOPIC-ADD ON

## 2014-12-30 LAB — URINALYSIS, ROUTINE W REFLEX MICROSCOPIC
BILIRUBIN URINE: NEGATIVE
Glucose, UA: NEGATIVE mg/dL
HGB URINE DIPSTICK: NEGATIVE
Ketones, ur: NEGATIVE mg/dL
Nitrite: NEGATIVE
Protein, ur: NEGATIVE mg/dL
SPECIFIC GRAVITY, URINE: 1.027 (ref 1.005–1.030)
Urobilinogen, UA: 0.2 mg/dL (ref 0.0–1.0)
pH: 5.5 (ref 5.0–8.0)

## 2014-12-30 NOTE — Discharge Summary (Signed)
Discharge summary job [id number]

## 2014-12-30 NOTE — Discharge Summary (Signed)
NAMEBRIZEYDA, HOLTMEYER NO.:  [id number]  MEDICAL RECORD NO.:  [id number]  LOCATION:  [id number]                        FACILITY:  [facility]  PHYSICIAN:  [name], M.D.DATE OF BIRTH:  June 16, 1951  DATE OF ADMISSION:  12/18/2014 DATE OF DISCHARGE:  12/31/2014                              DISCHARGE SUMMARY   DISCHARGE DIAGNOSES: 1. Functional deficits secondary to left comminuted, displaced,     peritrochanteric femur fracture with open reduction and internal     fixation. 2. Subcutaneous Lovenox for deep venous thrombosis prophylaxis. 3. Pain management. 4. Acute blood loss anemia. 5. Extended-spectrum beta-lactamase urinary tract infection with     Primaxin, completed. 6. Hypertension. 7. Hypothyroidism.  HISTORY OF PRESENT ILLNESS:  This is a 64 year old right-handed female, admitted on December 15, 2014, after mechanical fall in the parking lot without loss of consciousness.  She lives with her husband, independent prior to admission.  The patient with immediate left hip pain.  X-rays and imaging revealed a left comminuted, displaced, peritrochanteric femur fracture.  Underwent ORIF of intramedullary nail on December 16, 2014, per Dr. Veverly Fells.  Nonweightbearing left lower extremity.  Hospital course pain management.  Subcutaneous Lovenox for DVT prophylaxis. Urine study; ESBL urinary tract infection, placed on Primaxin and completed antibiotic with contact precautions ongoing.  Acute blood loss anemia, 9.0.  Physical and occupational therapy ongoing.  The patient was admitted for comprehensive rehab program.  PAST MEDICAL HISTORY:  See discharge diagnoses.  SOCIAL HISTORY:  Lives with spouse.  FUNCTIONAL HISTORY:  Prior to admission, independent.  FUNCTIONAL STATUS:  Upon admission to [facility] was max assist to total assist for sit to stand, max assist lateral scoot transfers, min- to-mod assist activities of daily living.  PHYSICAL EXAMINATION:   VITAL SIGNS:  Blood pressure 136/53, pulse 100, temperature 99, respiration 16. GENERAL:  This was an alert female, oriented x3. LUNGS:  Clear to auscultation. CARDIAC:  Regular rate and rhythm. ABDOMEN:  Soft, nontender.  Good bowel sounds. EXTREMITIES:  Left hip incision dressed, appropriately tender.  REHABILITATION HOSPITAL COURSE:  The patient was admitted to Inpatient Rehab Services with therapies initiated on a 3-hour daily basis consisting of physical therapy, occupational therapy, and rehabilitation nursing.  The following issues were addressed during the patient's rehabilitation stay.  Pertaining to Ms. Kober, left comminuted, displaced, peritrochanteric femur fracture, she had undergone ORIF, nonweightbearing, neurovascular sensation intact.  She would follow up with the Orthopedic Services.  Subcutaneous Lovenox for DVT prophylaxis. No bleeding episodes.  Venous Doppler studies of lower extremities were negative.  Pain management with the use of hydrocodone, Robaxin as needed with good results.  Acute blood loss anemia, latest hemoglobin 10.0, she would follow up with her primary MD.  She had completed a course of Primaxin for ESBL urinary tract infection.  She remained afebrile.  Contact precautions as indicated.  She remained on hormone supplement for hypothyroidism.  Blood pressure was well controlled on Avapro.  The patient received weekly collaborative interdisciplinary team conferences to discuss estimated length of stay, family teaching, and any barriers to discharge.  She required overall supervision for wheelchair propulsion with a slow steady pace.  Minimal cues for obstacle negotiation.  Required supervision for supine to sit on a transfer mat.  Steady assist for upper-lower body bathing and dressing, supervision for functional transfers with a rolling walker.  She was limited by nonweightbearing status, full family teaching was completed and plan discharge to  home.  DISCHARGE MEDICATIONS: 1. Flonase two sprays each nostril daily. 2. Hydrocodone one to two tablets every 6 hours as needed for moderate     pain, dispense of 90 tablets. 3. Avapro 300 mg p.o. daily. 4. Synthroid 88 mcg p.o. daily. 5. Robaxin as directed. 6. Vitamin D 50,000 units three times weekly.  DIET:  Regular.  SPECIAL INSTRUCTIONS:  The patient would follow up with Dr. Alger Simons at the Outpatient Rehab Service office as needed; Dr. Esmond Plants, Orthopedic Services, 2 weeks call for appointment; Dr. Crist Infante, medical management.  The patient was advised nonweightbearing left lower extremity.     Lauraine Rinne, P.A.   ______________________________ [name], M.D.    DA/MEDQ  D:  12/30/2014  T:  12/30/2014  Job:  [id number]  cc:   Doran Heater. Veverly Fells, M.D. Jeannette How. Perini, M.D.

## 2014-12-30 NOTE — Progress Notes (Signed)
Physical Therapy Session Note  Patient Details  Name: [name] MRN: [id number] Date of Birth: 04/25/1951  Today's Date: 12/30/2014 PT Individual Time: 0800-0900 PT Individual Time Calculation (min): 60 min   Short Term Goals: Week 1:  PT Short Term Goal 1 (Week 1): Pt will initiate bed mobility with PT.  PT Short Term Goal 1 - Progress (Week 1): Met PT Short Term Goal 2 (Week 1): Pt will demonstrate sit to stand with CGA.  PT Short Term Goal 2 - Progress (Week 1): Met PT Short Term Goal 3 (Week 1): Pt will transfer w/c to/from bed with CGA and RW.  PT Short Term Goal 3 - Progress (Week 1): Met PT Short Term Goal 4 (Week 1): Pt will ambulate x 15' with RW req min A.  PT Short Term Goal 4 - Progress (Week 1): Met PT Short Term Goal 5 (Week 1): Pt will self propel manual w/c x 100' req SBA and verbal cues.  PT Short Term Goal 5 - Progress (Week 1): Met  Skilled Therapeutic Interventions/Progress Updates:  1:1. Pt received sitting in w/c, ready for therapy. Pt reporting improved pain with use of ice following tx session yesterday, encouraged use after todays session. Focus this session on functional endurance, functional transfers and ambulation, therex and d/c planning. Pt req mod(I) for w/c propulsion 150'x2 with B UE, demonstrating slow but steady pace.   Pt with fair tolerance to ambulation 30'x1 and 25'x1 with RW demonstrating slow and non-fluid pace due to pain and anxiety, req supervision overall. Pt practiced negotiation up/down 2" step x3 reps with RW in reverse direction and min guard A.   Pt with good tolerance to supine/seated therex to target B LE strength, endurance and ROM. Exercises included 2x10 reps of: ankle pumps, quad sets, glute sets, heel slides, hip abd/add, marcing and LAQ. Pt utilized handout with min cues for correction of technique.   Pt educated on d/c process, home safety, recommended supervision during standing mobility with RW and goals/benefits of HH PT.  Pt verbalized understanding.   Pt reporting 5/10 pain in L hip during session, RN made aware. Pt left sitting in recliner at end of session w/ all needs in reach, ice pack in place.    Therapy Documentation Precautions:  Precautions Precautions: Fall Restrictions Weight Bearing Restrictions: Yes LUE Weight Bearing:  (Patient is NOT NWB L UE!!!) LLE Weight Bearing: Non weight bearing Pain: Pain Assessment Pain Assessment: 0-10 Pain Score: 5  Pain Type: Acute pain Pain Location: Hip Pain Orientation: Left Pain Descriptors / Indicators: Aching Pain Onset: On-going Pain Intervention(s): RN made aware;Repositioned;Distraction;Rest  See FIM for current functional status  Therapy/Group: Individual Therapy  Gilmore Laroche 12/30/2014, 9:00 AM

## 2014-12-30 NOTE — Progress Notes (Signed)
Physical Therapy Discharge Summary  Patient Details  Name: [name] MRN: [id number] Date of Birth: 1951/08/23  Today's Date: 12/31/2014 PT Individual Time: 1000-1040 PT Individual Time Calculation (min): 40 min    Patient has met 9 of 10 long term goals due to improved activity tolerance, improved balance, increased strength, increased range of motion, decreased pain and ability to compensate for deficits.  Patient to discharge at Mod(I) w/c level, but supervision for transfers and short distance ambulation with RW. Patient's husband is able to provide the recommended supervision for transfers and ambulation upon discharge.     Reasons goals not met: Continued recommendation for supervision during bed<>w/c transfer for overall safety.  Recommendation:  Patient will benefit from ongoing skilled PT services in home health setting to continue to advance safe functional mobility, address ongoing impairments in decreased functional endurance, decreased balance, decreased strength, decreased ROM, pain, decreased overall functional transfers and mobility and minimize fall risk.  Equipment: Rolling walker, wheelchair  Reasons for discharge: treatment goals met and discharge from hospital  Patient/family agrees with progress made and goals achieved: Yes  Skilled Therapeutic Intervention 1:1. Pt received sitting in w/c, ready for therapy. Focus this session on family training with pt's husband. Pt and husband educated on overall home safety, fall prevention, goals/benefits of HH PT and recommendation for supervision while pt performing standing mobility with RW, but pt safe at mod(I) w/c level. Pt's husband able to provide safe supervision for pt during ambulation 25'x1 with RW, car t/f ,tub bench t/f as well as use of bathroom at end of session. Pt's husband instructed on management of w/c parts for transportation purposes. Both verbalized understanding of all education this session. Pt left  sitting in w/c at end of session w/ all needs in reach.   PT Discharge Precautions/Restrictions Precautions Precautions: Fall Restrictions Weight Bearing Restrictions: Yes LLE Weight Bearing: Weight bearing as tolerated Vital Signs Therapy Vitals Temp: 98 F (36.7 C) Temp Source: Oral Pulse Rate: 94 Resp: 18 BP: (!) 130/49 mmHg Patient Position (if appropriate): Sitting Oxygen Therapy SpO2: 100 % O2 Device: Not Delivered Pain   Vision/Perception  See OT discharge  Cognition Overall Cognitive Status: Within Functional Limits for tasks assessed Arousal/Alertness: Awake/alert Orientation Level: Oriented X4 Attention: Selective;Alternating Selective Attention: Appears intact Alternating Attention: Appears intact Memory: Appears intact Awareness: Appears intact Problem Solving: Appears intact Safety/Judgment: Appears intact Comments: Pt with slightly increased anxiety during standing mobility Sensation Sensation Light Touch: Appears Intact Stereognosis: Appears Intact Hot/Cold: Appears Intact Proprioception: Appears Intact Coordination Gross Motor Movements are Fluid and Coordinated: Yes Fine Motor Movements are Fluid and Coordinated: Yes Motor  Motor Motor: Other (comment) Motor - Discharge Observations: generalized weakness  Mobility Bed Mobility Bed Mobility: Supine to Sit;Sit to Supine Supine to Sit: 6: Modified independent (Device/Increase time) Sit to Supine: 6: Modified independent (Device/Increase time) Transfers Transfers: Yes Sit to Stand: 5: Supervision Sit to Stand Details: Verbal cues for technique;Verbal cues for safe use of DME/AE Stand to Sit: 5: Supervision Stand to Sit Details (indicate cue type and reason): Verbal cues for technique;Verbal cues for safe use of DME/AE Stand Pivot Transfers: 5: Supervision Stand Pivot Transfer Details: Verbal cues for technique;Verbal cues for safe use of DME/AE Locomotion  Ambulation Ambulation/Gait  Assistance: 5: Supervision Ambulation Distance (Feet): 30 Feet Assistive device: Rolling walker Ambulation/Gait Assistance Details: Verbal cues for gait pattern;Verbal cues for safe use of DME/AE Gait Gait: Yes Gait Pattern: Impaired Gait Pattern:  (hopping on R LE ) Gait velocity:  decreased Stairs / Additional Locomotion Stairs: Yes Stairs Assistance: 4: Min assist Stairs Assistance Details: Verbal cues for technique;Verbal cues for safe use of DME/AE Stairs Assistance Details (indicate cue type and reason): hopping up/down 2" x3 reps Stair Management Technique: Backwards;With Retail banker Mobility: Yes Wheelchair Assistance: 6: Modified independent (Device/Increase time) Environmental health practitioner: Both upper extremities Wheelchair Parts Management: Independent Distance: 175'  Trunk/Postural Assessment  Cervical Assessment Cervical Assessment: Within Functional Limits Thoracic Assessment Thoracic Assessment: Within Functional Limits Lumbar Assessment Lumbar Assessment: Within Functional Limits Postural Control Postural Control: Within Functional Limits  Balance Balance Balance Assessed: Yes Static Sitting Balance Static Sitting - Balance Support: No upper extremity supported;Feet supported Static Sitting - Level of Assistance: 7: Independent Dynamic Sitting Balance Dynamic Sitting - Balance Support: No upper extremity supported;Feet supported Dynamic Sitting - Level of Assistance: 6: Modified independent (Device/Increase time) Dynamic Sitting - Balance Activities: Lateral lean/weight shifting;Forward lean/weight shifting;Reaching for objects;Reaching across midline Static Standing Balance Static Standing - Balance Support: Left upper extremity supported;Right upper extremity supported;Bilateral upper extremity supported Static Standing - Level of Assistance: 5: Stand by assistance Dynamic Standing Balance Dynamic Standing - Balance Support: Bilateral  upper extremity supported;Left upper extremity supported;Right upper extremity supported Dynamic Standing - Level of Assistance: 5: Stand by assistance Dynamic Standing - Balance Activities: Forward lean/weight shifting;Lateral lean/weight shifting;Reaching for objects;Reaching across midline;Reaching for weighted objects Extremity Assessment  RUE Assessment RUE Assessment: Within Functional Limits LUE Assessment LUE Assessment: Within Functional Limits RLE Assessment RLE Assessment: Within Functional Limits RLE Strength RLE Overall Strength Comments: grossly 4+/5 LLE Assessment LLE Assessment: Exceptions to WFL LLE AROM (degrees) Overall AROM Left Lower Extremity: Deficits;Due to pain LLE Overall AROM Comments: knee and ankle WFL; hip flexion minimal due to decreased strength, pain and edema LLE Strength LLE Overall Strength: Deficits;Due to pain LLE Overall Strength Comments: pt demonstrates fair functional strength overall  See FIM for current functional status  Gilmore Laroche 12/31/2014, 12:16 PM

## 2014-12-30 NOTE — Progress Notes (Signed)
Occupational Therapy Discharge Summary  Patient Details  Name: [name] MRN: [id number] Date of Birth: 1951/06/05     Patient has met 90 of 11 long term goals due to improved activity tolerance, improved balance, postural control, ability to compensate for deficits and improved coordination.  Pt made steady progress with BADLs during this admission and is supervision for functional transfers, bathing, and dressing.  Pt completes simple snack preparation tasks with supervision.  Pt's husband has been present to observe therapy sessions.Patient to discharge at overall Supervision level.  Patient's care partner is independent to provide the necessary physical and cognitive assistance at discharge.     Recommendation:  Patient will benefit from ongoing skilled OT services in home health setting to continue to advance functional skills in the area of BADLs, strengthening, balance, activity tolerance, and minimize fall risk.  Equipment: Tub Producer, television/film/video, BSC  Reasons for discharge: treatment goals met and discharge from hospital  Patient/family agrees with progress made and goals achieved: Yes  OT Discharge Precautions/Restrictions  Restrictions Weight Bearing Restrictions: Yes LLE Weight Bearing: Non weight bearing Vision/Perception  Vision- History Baseline Vision/History: Wears glasses Wears Glasses: At all times Patient Visual Report: No change from baseline Vision- Assessment Vision Assessment?: No apparent visual deficits  Cognition Overall Cognitive Status: Within Functional Limits for tasks assessed Arousal/Alertness: Awake/alert Orientation Level: Oriented X4 Attention: Focused;Sustained;Alternating;Selective Focused Attention: Appears intact Selective Attention: Appears intact Alternating Attention: Appears intact Memory: Appears intact Awareness: Appears intact Problem Solving: Appears intact Safety/Judgment: Appears intact Sensation Sensation Light Touch:  Appears Intact Stereognosis: Appears Intact Hot/Cold: Appears Intact Proprioception: Appears Intact Coordination Gross Motor Movements are Fluid and Coordinated: Yes Fine Motor Movements are Fluid and Coordinated: Yes    Trunk/Postural Assessment  Cervical Assessment Cervical Assessment: Within Functional Limits Thoracic Assessment Thoracic Assessment: Within Functional Limits Lumbar Assessment Lumbar Assessment: Within Functional Limits Postural Control Postural Control: Within Functional Limits    Extremity/Trunk Assessment RUE Assessment RUE Assessment: Within Functional Limits LUE Assessment LUE Assessment: Within Functional Limits  See FIM for current functional status  Leroy Libman 12/30/2014, 11:58 AM

## 2014-12-30 NOTE — Progress Notes (Signed)
Occupational Therapy Session Note  Patient Details  Name: [name] MRN: [id number] Date of Birth: 1951-05-28  Today's Date: 12/30/2014 OT Individual Time: 0700-0800 OT Individual Time Calculation (min): 60 min    Short Term Goals: Week 2:  OT Short Term Goal 1 (Week 2): STG=LTG secondary to pending D/C  Skilled Therapeutic Interventions/Progress Updates:    Pt engaged in bathing at shower level and dressing with sit<>stand from w/c at sink.  Pt used AE appropriately for LB bathing and dressing tasks. Pt completed all tasks at supervision level.  Pt continues to require extra time to complete tasks with multiple rest breaks.  Pt pleased with progress and ready for discharge tomorrow.  Therapy Documentation Precautions:  Precautions Precautions: Fall Restrictions Weight Bearing Restrictions: Yes LUE Weight Bearing:  (Patient is NOT NWB L UE!!!) LLE Weight Bearing: Non weight bearing Pain: Pain Assessment Pain Assessment: No/denies pain Pain Score: 3  Pain Type: Acute pain Pain Location: Hip Pain Orientation: Left Pain Descriptors / Indicators: Aching Pain Onset: On-going Pain Intervention(s): RN aware (Comment);Repositioned  See FIM for current functional status  Therapy/Group: Individual Therapy  Leroy Libman 12/30/2014, 8:03 AM

## 2014-12-30 NOTE — Progress Notes (Signed)
Physical Therapy Session Note  Patient Details  Name: [name] MRN: [id number] Date of Birth: Oct 24, 1951  Today's Date: 12/30/2014 PT Individual Time: 1300-1400 PT Individual Time Calculation (min): 60 min   Short Term Goals: Week 2:  PT Short Term Goal 1 (Week 2): STGs=LTGs  Skilled Therapeutic Interventions/Progress Updates:  1:1. Pt received sitting in recliner, ready for therapy. Pt reported improved rest/pain relief while sitting in recliner as well as with use of ice following PT. Focus this session on functional endurance during w/c propulsion, functional transfers and mobility. Pt req overall supervision all t/f sit<>stand and SPT with RW, excellent ability to maintain NWB L LE. Pt practiced car transfer in therapy car, however, continued to req min A due to car dimensions and decreased ROM in L LE. Simulated car t/f set up on tx mat to more accurately reflect car that she will go in, pt req supervision for safe completion with min cues for technique.   Pt mod(I) for management of w/c parts and propulsion 150-175'x3 bouts with B UE, demonstrating slow but steady pace as well as good ability to negotiate obstacles.   Pt practiced ambulating over low thresholds with RW, req close(S) with min-mod cues for seq and technique. Pt with increased anxiety during task.   Pt left sitting in recliner at end of session w/ all needs in reach, ice pack in place.   Therapy Documentation Precautions:  Precautions Precautions: Fall Restrictions Weight Bearing Restrictions: Yes LUE Weight Bearing:  (Patient is NOT NWB L UE!!!) LLE Weight Bearing: Non weight bearing Pain: Pain Assessment Pain Score: 8  Pain Type: Acute pain Pain Location: Hip Pain Orientation: Left Pain Descriptors / Indicators: Aching Pain Frequency: Intermittent Pain Onset: On-going Pain Intervention(s): Medication (See eMAR)  See FIM for current functional status  Therapy/Group: Individual Therapy  Gilmore Laroche 12/30/2014, 2:49 PM

## 2014-12-30 NOTE — Progress Notes (Signed)
[facility]IC[facility] RE[facility]ILIT[facility]     PROGRESS NOTE    Subjective/Complaints: No new issues. Pain improving. A little nervous about going home.  Objective: Vital Signs: Blood pressure 122/54, pulse 89, temperature 99.5 F (37.5 C), temperature source Oral, resp. rate 18, SpO2 98 %. No results found.  Recent Labs  12/29/14 0501  WBC 13.4*  HGB 10.0*  HCT 31.9*  PLT 614*   No results for input(s): NA, K, CL, GLUCOSE, BUN, CREATININE, CALCIUM in the last 72 hours.  Invalid input(s): CO CBG (last 3)  No results for input(s): GLUCAP in the last 72 hours.  Wt Readings from Last 3 Encounters:  12/16/14 87.544 kg (193 lb)  08/04/14 87 kg (191 lb 12.8 oz)  01/01/14 87.544 kg (193 lb)    Physical Exam:  Constitutional: She is oriented to person, place, and time. She appears well-developed.  HENT:  Head: Normocephalic.  Eyes: EOM are normal.  Neck: Normal range of motion. Neck supple. No thyromegaly present.  Cardiovascular: Normal rate and regular rhythm.  Respiratory: Effort normal and breath sounds normal. No respiratory distress.  GI: Soft. Bowel sounds are normal. She exhibits no distension.  Neurological: She is alert and oriented to person, place, and time.  Skin:  Left hip wounds without drainage---staples out  Motor strength is 5/5 bilateral deltoids, biceps, triceps, grip, 4 minus right hip flexor and knee extensor and ankle dorsal flexor, 2  left hip flexor 3 minus knee extensor 4   ankle dorsal flexor on the left   Assessment/Plan: 1. Functional deficits secondary to left comminuted and displaced peritrochanteric femur fracture which require 3+ hours per day of interdisciplinary therapy in a comprehensive inpatient rehab setting. Physiatrist is providing close team supervision and 24 hour management of active medical problems listed below. Physiatrist and rehab team continue to assess barriers to discharge/monitor patient progress toward  functional and medical goals. FIM: FIM - Bathing Bathing Steps Patient Completed: Chest, Right Arm, Left Arm, Abdomen, Front perineal area, Buttocks, Left lower leg (including foot), Right lower leg (including foot), Left upper leg, Right upper leg Bathing: 5: Supervision: Safety issues/verbal cues  FIM - Upper Body Dressing/Undressing Upper body dressing/undressing steps patient completed: Thread/unthread left sleeve of pullover shirt/dress, Pull shirt over trunk, Put head through opening of pull over shirt/dress, Thread/unthread right sleeve of pullover shirt/dresss, Thread/unthread right bra strap, Thread/unthread left bra strap, Hook/unhook bra Upper body dressing/undressing: 6: More than reasonable amount of time FIM - Lower Body Dressing/Undressing Lower body dressing/undressing steps patient completed: Thread/unthread right underwear leg, Thread/unthread left underwear leg, Pull underwear up/down, Thread/unthread right pants leg, Thread/unthread left pants leg, Pull pants up/down, Don/Doff left sock, Don/Doff right sock, Don/Doff right shoe, Don/Doff left shoe, Fasten/unfasten right shoe, Fasten/unfasten left shoe Lower body dressing/undressing: 5: Supervision: Safety issues/verbal cues  FIM - Toileting Toileting steps completed by patient: Adjust clothing prior to toileting, Performs perineal hygiene, Adjust clothing after toileting Toileting Assistive Devices: Grab bar or rail for support Toileting: 5: Supervision: Safety issues/verbal cues  FIM - Radio producer Devices: Elevated toilet seat, Insurance account manager Transfers: 5-To toilet/BSC: Supervision (verbal cues/safety issues), 5-From toilet/BSC: Supervision (verbal cues/safety issues)  FIM - Control and instrumentation engineer Devices: Walker, Arm rests Bed/Chair Transfer: 5: Sit > Supine: Supervision (verbal cues/safety issues), 5: Chair or W/C > Bed: Supervision (verbal cues/safety issues), 5:  Bed > Chair or W/C: Supervision (verbal cues/safety issues), 5: Supine > Sit: Supervision (verbal cues/safety issues)  FIM - Locomotion:  Wheelchair Distance: 120 Locomotion: Wheelchair: 5: Travels 150 ft or more: maneuvers on rugs and over door sills with supervision, cueing or coaxing FIM - Locomotion: Ambulation Locomotion: Ambulation Assistive Devices: Administrator Ambulation/Gait Assistance: 5: Supervision Locomotion: Ambulation: 0: Activity did not occur  Comprehension Comprehension Mode: Auditory Comprehension: 7-Follows complex conversation/direction: With no assist  Expression Expression Mode: Verbal Expression: 7-Expresses complex ideas: With no assist  Social Interaction Social Interaction: 7-Interacts appropriately with others - No medications needed.  Problem Solving Problem Solving: 5-Solves complex 90% of the time/cues < 10% of the time  Memory Memory: 7-Complete Independence: No helper  Medical Problem List and Plan: 1. Functional deficits secondary to left comminuted displaced peritrochanteric femur fracture. Status post ORIF. Nonweightbearing left lower extremity  -staples out wound looks good 2. DVT Prophylaxis/Anticoagulation: SQ Lovenox. Monitor for any bleeding episodes.   3. Pain Management: Hydrocodone and Robaxin as needed. Monitor with increased mobility on  - scheduled robaxin and hydrocodone for now   -ice   to leg 4. Acute blood loss anemia. hgb 10.0 5. Neuropsych: This patient is capable of making decisions on her own behalf. 6. Skin/Wound Care: Routine skin checks of surgical site 7. Fluids/Electrolytes/Nutrition: Strict I and O follow-up chemistries 8.ESBL?UTI.Primaxin completed/contact precaution 9. Hypertension. Avapro 300 mg daily. Monitor closely with increased mobility 10. Hypothyroidism. Synthroid 11. Leukoctosis: has ranged from 10k to 16k---afebrile  -wound   drainage has stopped--dc staples soon  -observe clinically  -13.4  yesterday   LOS (Days) 12 A FACE TO FACE EVALUATION WAS PERFORMED  SWARTZ,ZACHARY T 12/30/2014 8:06 AM

## 2014-12-31 ENCOUNTER — Ambulatory Visit (HOSPITAL_COMMUNITY): Payer: 59

## 2014-12-31 MED ORDER — HYDROCODONE-ACETAMINOPHEN 5-325 MG PO TABS: 1.0000 | ORAL_TABLET | Freq: Four times a day (QID) | ORAL | Status: DC | PRN

## 2014-12-31 MED ORDER — METHOCARBAMOL 500 MG PO TABS: 750.0000 mg | ORAL_TABLET | Freq: Four times a day (QID) | ORAL | Status: DC | PRN

## 2014-12-31 MED ORDER — LEVOTHYROXINE SODIUM 88 MCG PO TABS: 88.0000 ug | ORAL_TABLET | Freq: Every day | ORAL | Status: DC

## 2014-12-31 MED ORDER — METHOCARBAMOL 750 MG PO TABS: 750.0000 mg | ORAL_TABLET | Freq: Four times a day (QID) | ORAL | Status: DC | PRN

## 2014-12-31 NOTE — Progress Notes (Signed)
Pt discharged to home with husband to home at 36. Belongings with pt. Discharge instructions given be Silvestre Mesi, PA with verbal understanding.

## 2014-12-31 NOTE — Discharge Instructions (Signed)
Inpatient Rehab Discharge Instructions  Jemia Fata Discharge date and time: No discharge date for patient encounter.   Activities/Precautions/ Functional Status: Activity: Nonweightbearing left lower extremity Diet: regular diet Wound Care: keep wound clean and dry Functional status:  ___ No restrictions     ___ Walk up steps independently ___ 24/7 supervision/assistance   ___ Walk up steps with assistance ___ Intermittent supervision/assistance  ___ Bathe/dress independently ___ Walk with walker     ___ Bathe/dress with assistance ___ Walk Independently    ___ Shower independently __x_ Walk with assistance    ___ Shower with assistance ___ No alcohol     ___ Return to work/school ________    COMMUNITY REFERRALS UPON DISCHARGE:    Home Health:   PT     OT                        Agency:  [facility] Phone: [phone]   Medical Equipment/Items Ordered:  Wheelchair, cushion, rolling walker, 3n1 commode and tub bench                                                      Agency/Supplier:  [facility] @ [phone]      Special Instructions:    My questions have been answered and I understand these instructions. I will adhere to these goals and the provided educational materials after my discharge from the hospital.  Patient/Caregiver Signature _______________________________ Date __________  Clinician Signature _______________________________________ Date __________  Please bring this form and your medication list with you to all your follow-up doctor's appointments.

## 2014-12-31 NOTE — Progress Notes (Signed)
Social Work  Discharge Note  The overall goal for the admission was met for:   Discharge location: Yes - home with husband who can provide 24 hour supervision  Length of Stay: Yes - 13 days  Discharge activity level: Yes - mod i w/c level and supervision standing  Home/community participation: Yes  Services provided included: MD, RD, PT, OT, RN, TR, Pharmacy and [place]: Private Insurance: [employer]  Follow-up services arranged: Home Health: PT, OT via [facility], DME: 18x18 lightweight w/c with ELRs, basic cushion, rolling walker, 3n1 commode and tub bench via [facility] and Patient/Family has no preference for HH/DME agencies  Comments (or additional information):  Patient/Family verbalized understanding of follow-up arrangements: Yes  Individual responsible for coordination of the follow-up plan: patient  Confirmed correct DME delivered: Desira Alessandrini 12/31/2014    Woodley Petzold

## 2014-12-31 NOTE — Patient Care Conference (Addendum)
Inpatient RehabilitationTeam Conference and Plan of Care Update Date: 12/30/2014   Time: 2:50 PM    Patient Name: [name]      Medical Record Number: [id number]  Date of Birth: [birth year] Sex: Female         Room/Bed: 4W14C/4W14C-01 Payor Info: Payor: [employer] / Plan: Electrical engineer / Product Type: *No Product type* /    Admitting Diagnosis: femur fx  Admit Date/Time:  12/18/2014  5:23 PM Admission Comments: No comment available   Primary Diagnosis:  Fracture, intertrochanteric, left femur Principal Problem: Fracture, intertrochanteric, left femur  Patient Active Problem List   Diagnosis Date Noted  . Acute blood loss anemia 12/23/2014  . Fracture, intertrochanteric, left femur 12/18/2014  . Fall   . Subtrochanteric fracture of femur 12/16/2014  . UTI (urinary tract infection) 12/16/2014  . Leukocytosis 12/16/2014  . Hip fracture requiring operative repair   . Closed left hip fracture 12/15/2014  . Lesion of right humerus 12/15/2014  . Osteoporosis, unspecified 10/15/2013  . HTN (hypertension) 07/16/2013  . Hypothyroidism 07/16/2013  . Osteopenia 07/16/2013  . Environmental allergies 07/16/2013  . Obesity (BMI 30-39.9) 07/16/2013    Expected Discharge Date: Expected Discharge Date: 12/31/14  Team Members Present: Physician leading conference: Dr. Alger Simons Social Worker Present: Lennart Pall, LCSW Nurse Present: Elliot Cousin, RN PT Present: Melene Plan, Cottie Banda, PT OT Present: Roanna Epley, COTA;Jennifer Jolene Schimke, OT SLP Present: Weston Anna, SLP PPS Coordinator present : Daiva Nakayama, RN, CRRN     Current Status/Progress Goal Weekly Team Focus  Medical   hip pain better. staples out. counts recovering  pain mgt  wound, sleep   Bowel/Bladder   continent of bowel and bladder  remain continent of bowel and bladder manage with min assist  cont. plan of care with bowel and bladder   Swallow/Nutrition/ Hydration              ADL's   supervision overall  supervision overall  discharge tomorrow, activity tolerance   Mobility   At target level  supervision-mod I  functional endurance, strength, balance, safety, education, pain management, family training, d/c planning   Communication             Safety/Cognition/ Behavioral Observations            Pain   pain to left hip and thigh- scheduled vicoden and 750 robaxin. PRN 1-2 tabs vicoden q4 hrs  pain equal to or less than 3 on scale of 0-10  assess pain q shift and medicate as needed- cont. scheduled medication   Skin   steri strips to left hip CDI  no skin injury/breakdown, no s/s of infection to incision sites with min assist  assess skin q shift- cont. to monitor for s/s of infection    Rehab Goals Patient on target to meet rehab goals: Yes *See Care Plan and progress notes for long and short-term goals.  Barriers to Discharge: anxiety    Possible Resolutions to Barriers:  family and patient ed    Discharge Planning/Teaching Needs:  home with husband who can provide 24/7 supervision      Team Discussion:  Low grade temp and will recheck labs prior to d/c.  Plan to complete family ed tomorrow am.  Reaching mod i w/c level goals (supervision with standing)  Revisions to Treatment Plan:  None   Continued Need for Acute Rehabilitation Level of Care: The patient requires daily medical management by a physician with specialized training in physical medicine  and rehabilitation for the following conditions: Daily direction of a multidisciplinary physical rehabilitation program to ensure safe treatment while eliciting the highest outcome that is of practical value to the patient.: Yes Daily medical management of patient stability for increased activity during participation in an intensive rehabilitation regime.: Yes Daily analysis of laboratory values and/or radiology reports with any subsequent need for medication adjustment of medical intervention for :  Post surgical problems;Other  Ashlin Hidalgo 12/31/2014, 9:13 AM

## 2014-12-31 NOTE — Progress Notes (Signed)
PHYSICAL MEDICINE & REHABILITATION     PROGRESS NOTE    Subjective/Complaints: No complaints. Ready to go home today. Pain improving. Left thigh still somewhat "sensitive".  Objective: Vital Signs: Blood pressure 151/76, pulse 87, temperature 98.2 F (36.8 C), temperature source Oral, resp. rate 18, SpO2 97 %. No results found.  Recent Labs  12/29/14 0501  WBC 13.4*  HGB 10.0*  HCT 31.9*  PLT 614*   No results for input(s): NA, K, CL, GLUCOSE, BUN, CREATININE, CALCIUM in the last 72 hours.  Invalid input(s): CO CBG (last 3)  No results for input(s): GLUCAP in the last 72 hours.  Wt Readings from Last 3 Encounters:  12/16/14 87.544 kg (193 lb)  08/04/14 87 kg (191 lb 12.8 oz)  01/01/14 87.544 kg (193 lb)    Physical Exam:  Constitutional: She is oriented to person, place, and time. She appears well-developed.  HENT:  Head: Normocephalic.  Eyes: EOM are normal.  Neck: Normal range of motion. Neck supple. No thyromegaly present.  Cardiovascular: Normal rate and regular rhythm.  Respiratory: Effort normal and breath sounds normal. No respiratory distress.  GI: Soft. Bowel sounds are normal. She exhibits no distension.  Neurological: She is alert and oriented to person, place, and time.  Skin:  Left hip wounds without drainage---staples out, left thigh still with swelling  Motor strength is 5/5 bilateral deltoids, biceps, triceps, grip, 4 minus right hip flexor and knee extensor and ankle dorsal flexor, 2  left hip flexor 3 minus knee extensor 4   ankle dorsal flexor on the left   Assessment/Plan: 1. Functional deficits secondary to left comminuted and displaced peritrochanteric femur fracture which require 3+ hours per day of interdisciplinary therapy in a comprehensive inpatient rehab setting. Physiatrist is providing close team supervision and 24 hour management of active medical problems listed below. Physiatrist and rehab team continue to  assess barriers to discharge/monitor patient progress toward functional and medical goals. FIM: FIM - Bathing Bathing Steps Patient Completed: Chest, Right Arm, Left Arm, Abdomen, Front perineal area, Buttocks, Left lower leg (including foot), Right lower leg (including foot), Left upper leg, Right upper leg Bathing: 5: Supervision: Safety issues/verbal cues  FIM - Upper Body Dressing/Undressing Upper body dressing/undressing steps patient completed: Thread/unthread left sleeve of pullover shirt/dress, Pull shirt over trunk, Put head through opening of pull over shirt/dress, Thread/unthread right sleeve of pullover shirt/dresss, Thread/unthread right bra strap, Thread/unthread left bra strap, Hook/unhook bra Upper body dressing/undressing: 6: More than reasonable amount of time FIM - Lower Body Dressing/Undressing Lower body dressing/undressing steps patient completed: Thread/unthread right underwear leg, Thread/unthread left underwear leg, Pull underwear up/down, Thread/unthread right pants leg, Thread/unthread left pants leg, Pull pants up/down, Don/Doff left sock, Don/Doff right sock, Don/Doff right shoe, Don/Doff left shoe, Fasten/unfasten right shoe, Fasten/unfasten left shoe Lower body dressing/undressing: 5: Supervision: Safety issues/verbal cues  FIM - Toileting Toileting steps completed by patient: Adjust clothing prior to toileting, Performs perineal hygiene, Adjust clothing after toileting Toileting Assistive Devices: Grab bar or rail for support Toileting: 5: Supervision: Safety issues/verbal cues  FIM - Radio producer Devices: Elevated toilet seat, Insurance account manager Transfers: 5-To toilet/BSC: Supervision (verbal cues/safety issues), 5-From toilet/BSC: Supervision (verbal cues/safety issues)  FIM - Control and instrumentation engineer Devices: Walker, Arm rests Bed/Chair Transfer: 6: Supine > Sit: No assist, 6: Sit > Supine: No assist, 5: Bed >  Chair or W/C: Supervision (verbal cues/safety issues), 5: Chair or W/C > Bed: Supervision (verbal cues/safety issues)  FIM - Locomotion: Wheelchair Distance: 175' Locomotion: Wheelchair: 6: Travels 150 ft or more, turns around, maneuvers to table, bed or toilet, negotiates 3% grade: maneuvers on rugs and over door sills independently FIM - Locomotion: Ambulation Locomotion: Ambulation Assistive Devices: Administrator Ambulation/Gait Assistance: 5: Supervision Locomotion: Ambulation: 1: Travels less than 50 ft with supervision/safety issues  Comprehension Comprehension Mode: Auditory Comprehension: 7-Follows complex conversation/direction: With no assist  Expression Expression Mode: Verbal Expression: 7-Expresses complex ideas: With no assist  Social Interaction Social Interaction: 7-Interacts appropriately with others - No medications needed.  Problem Solving Problem Solving: 5-Solves complex 90% of the time/cues < 10% of the time  Memory Memory: 7-Complete Independence: No helper  Medical Problem List and Plan: 1. Functional deficits secondary to left comminuted displaced peritrochanteric femur fracture. Status post ORIF. Nonweightbearing left lower extremity  -staples out wound looks good 2. DVT Prophylaxis/Anticoagulation: SQ Lovenox. Monitor for any bleeding episodes.   3. Pain Management: Hydrocodone and Robaxin as needed. Monitor with increased mobility on  - scheduled robaxin and hydrocodone for now   -ice   to leg 4. Acute blood loss anemia. supp 5. Neuropsych: This patient is capable of making decisions on her own behalf. 6. Skin/Wound Care: Routine skin checks of surgical site 7. Fluids/Electrolytes/Nutrition: Strict I and O follow-up chemistries 8.ESBL?UTI.Primaxin completed/contact precaution 9. Hypertension. Avapro 300 mg daily. Monitor closely with increased mobility 10. Hypothyroidism. Synthroid 11. Leukoctosis: has ranged from 10k to  16k---afebrile  -wounds clean, staples out  -observe clinically  -13.4   -low grade temp occasionally, ua neg, culture pending   LOS (Days) 13 A FACE TO FACE EVALUATION WAS PERFORMED  Desirre Eickhoff T 12/31/2014 8:34 AM

## 2014-12-31 NOTE — Plan of Care (Signed)
Problem: RH Bed to Chair Transfers Goal: LTG Patient will perform bed/chair transfers w/assist (PT) LTG: Patient will perform bed/chair transfers with assistance, with/without cues (PT).  Outcome: Not Met (add Reason) Continued recommendation for supervision for safety.

## 2015-01-01 LAB — URINE CULTURE: Colony Count: >=100000

## 2015-01-21 DIAGNOSIS — S72102D Unspecified trochanteric fracture of left femur, subsequent encounter for closed fracture with routine healing: Secondary | ICD-10-CM | POA: Diagnosis not present

## 2015-01-21 DIAGNOSIS — E039 Hypothyroidism, unspecified: Secondary | ICD-10-CM | POA: Diagnosis not present

## 2015-01-21 DIAGNOSIS — I10 Essential (primary) hypertension: Secondary | ICD-10-CM | POA: Diagnosis not present

## 2015-01-21 DIAGNOSIS — Z4801 Encounter for change or removal of surgical wound dressing: Secondary | ICD-10-CM | POA: Diagnosis not present

## 2015-01-21 DIAGNOSIS — W19XXXD Unspecified fall, subsequent encounter: Secondary | ICD-10-CM

## 2015-03-10 ENCOUNTER — Encounter: Payer: Self-pay | Admitting: Physical Therapy

## 2015-03-10 ENCOUNTER — Ambulatory Visit: Payer: 59 | Attending: Orthopedic Surgery | Admitting: Physical Therapy

## 2015-03-10 DIAGNOSIS — R262 Difficulty in walking, not elsewhere classified: Secondary | ICD-10-CM | POA: Insufficient documentation

## 2015-03-10 DIAGNOSIS — M25552 Pain in left hip: Secondary | ICD-10-CM | POA: Diagnosis not present

## 2015-03-10 DIAGNOSIS — M6289 Other specified disorders of muscle: Secondary | ICD-10-CM

## 2015-03-10 DIAGNOSIS — M629 Disorder of muscle, unspecified: Secondary | ICD-10-CM | POA: Insufficient documentation

## 2015-03-10 NOTE — Therapy (Signed)
[address] [place], [place], [address] Phone: [phone]   Fax:  [phone]  Physical Therapy Evaluation  Patient Details  Name: [name] MRN: [id number] Date of Birth: Jan 15, 1951 Referring Provider:  Netta Cedars, MD  Encounter Date: 03/10/2015      PT End of Session - 03/10/15 1700    Visit Number 1   Number of Visits 16   Date for PT Re-Evaluation 05/10/15   PT Start Time 1601   PT Stop Time 1703   PT Time Calculation (min) 62 min      Past Medical History  Diagnosis Date  . 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 Chickenpox   . Genital warts   . Environmental allergies   . Heart murmur   . HTN (hypertension)   . Hyperlipidemia   . Thyroid disease     Hypothyroid  . Thyroid nodule     Past Surgical History  Procedure Laterality Date  . Carpal tunnel release      Both wrist  . Cystectomy      From the cervix  . Intramedullary (im) nail intertrochanteric Left 12/15/2014    Procedure: INTRAMEDULLARY (IM) NAIL INTERTROCHANTRIC;  Surgeon: Augustin Schooling, MD;  Location: [facility];  Service: Orthopedics;  Laterality: Left;    There were no vitals filed for this visit.  Visit Diagnosis:  Difficulty walking - Plan: PT plan of care cert/re-cert  Leg fascia tightness - Plan: PT plan of care cert/re-cert  Left hip pain - Plan: PT plan of care cert/re-cert      Subjective Assessment - 03/10/15 1607    Symptoms Patient fell on 12/15/14.  She sustained a femur fracture, underwent IM nailing that same day.  She was in the hospital for 2-3 weeks.  She was non weight bearing for 7 weeks.  She had PT at home   Pertinent History none per patient   Limitations Sitting;Standing;Walking;House hold activities   How long can you sit comfortably? 30   How long can you stand comfortably? 10   How long can you walk comfortably? 4 minutes   Patient Stated Goals walk without pain and  be normal, I was walking daily for exercise   Currently in Pain? Yes   Pain Score 3    Pain Location Hip   Pain Orientation Left   Pain Descriptors / Indicators Aching   Pain Type Chronic pain   Pain Onset More than a month ago   Pain Frequency Intermittent   Aggravating Factors  any standing or walking   Pain Relieving Factors rest and ibuprofen   Effect of Pain on Daily Activities limits my mobility and all ADL's            [facility] PT Assessment - 03/10/15 0001    Assessment   Medical Diagnosis S/P left IM femur nailing   Onset Date 12/15/14   Prior Therapy in the hospital    Precautions   Precautions None   Restrictions   Weight Bearing Restrictions No   Balance Screen   Has the patient fallen in the past 6 months Yes   How many times? 1   Has the patient had a decrease in activity level because of a fear of falling?  No   Is the patient reluctant to leave their home because of a fear of falling?  No   Home Environment   Living Enviornment Private residence  Living Arrangements Spouse/significant other   Type of Home House   Additional Comments 3 steps into the home   Prior Function   Level of Independence Independent with basic ADLs;Independent with homemaking with ambulation   Vocation Full time employment   Vocation Requirements sitting   Leisure was walking 2 miles a day   AROM   Overall AROM Comments pain iwth end range hip and knee motions   Left Hip Flexion 50   Left Hip ABduction 15   Right Knee Extension --   Right Knee Flexion --   Left Knee Extension 7   Left Knee Flexion 109   Strength   Overall Strength Comments right knee 4/5   Flexibility   Soft Tissue Assessment /Muscle Length yes  She is extremely tight in hip flexor, piriformis and adducto   Palpation   Palpation sore in the right hip and thigh   Timed Up and Go Test   TUG Normal TUG   Normal TUG (seconds) 17                           PT Education - 03/10/15 1700     Education provided Yes   Education Details HEP for hip flexor, piriformis and adductor stretches   Person(s) Educated Patient;Spouse   Methods Explanation;Demonstration;Verbal cues;Tactile cues;Handout   Comprehension Verbalized understanding          PT Short Term Goals - 03/10/15 1703    PT SHORT TERM GOAL #1   Title independent with HEP   Time 2   Period Weeks   Status New   PT SHORT TERM GOAL #2   Title walk with walker with step through gait   Time 2   Period Weeks   Status New           PT Long Term Goals - 03/10/15 1704    PT LONG TERM GOAL #1   Title walk with SPC or less assistive device 600 feet   Time 8   Period Weeks   Status New   PT LONG TERM GOAL #2   Title increase left hip flexion to 90 degrees in standing   Time 8   Period Weeks   Status New   PT LONG TERM GOAL #3   Title no pain with ADL's   Time 8   Period Weeks   Status New   PT LONG TERM GOAL #4   Title put on shoes and socks independently   Time 8   Period Weeks   Status New               Plan - 03/10/15 1701    Clinical Impression Statement Patient with s/p IM nail of the left femur.  She is extremely tight around the left hip causing IR, adduction and trunk flexion, she has a very poor gait pattern, she has to use hands to lift the left leg up to get in the car and to get into bed/     Pt will benefit from skilled therapeutic intervention in order to improve on the following deficits Abnormal gait;Decreased activity tolerance;Decreased mobility;Decreased endurance;Decreased range of motion;Decreased strength;Increased muscle spasms;Difficulty walking;Impaired flexibility;Pain   Rehab Potential Good   PT Frequency 2x / week   PT Duration 8 weeks   PT Treatment/Interventions Electrical Stimulation;Ultrasound;Moist Heat;Gait training;Functional mobility training;Balance training;Therapeutic exercise;Therapeutic activities;Patient/family education;Passive range of motion   PT  Next Visit Plan get her moving, work on step  through gait   Consulted and Agree with Plan of Care Patient          G-Codes - Mar 21, 2015 1706    Functional Assessment Tool Used FOTO   Functional Limitation Mobility: Walking and moving around   Mobility: Walking and Moving Around Current Status [phone]) At least 60 percent but less than 80 percent impaired, limited or restricted   Mobility: Walking and Moving Around Goal Status [phone]) At least 40 percent but less than 60 percent impaired, limited or restricted       Problem List Patient Active Problem List   Diagnosis Date Noted  . Acute blood loss anemia 12/23/2014  . Fracture, intertrochanteric, left femur 12/18/2014  . Fall   . Subtrochanteric fracture of femur 12/16/2014  . UTI (urinary tract infection) 12/16/2014  . Leukocytosis 12/16/2014  . Hip fracture requiring operative repair   . Closed left hip fracture 12/15/2014  . Lesion of right humerus 12/15/2014  . Osteoporosis, unspecified 10/15/2013  . HTN (hypertension) 07/16/2013  . Hypothyroidism 07/16/2013  . Osteopenia 07/16/2013  . Environmental allergies 07/16/2013  . Obesity (BMI 30-39.9) 07/16/2013    Sumner Boast, PT 03/21/2015, 5:08 PM  [place] [place] [address] [place], [place], [address] Phone: [phone]   Fax:  [phone]

## 2015-03-17 ENCOUNTER — Encounter: Payer: Self-pay | Admitting: Physical Therapy

## 2015-03-17 ENCOUNTER — Ambulatory Visit: Payer: 59 | Admitting: Physical Therapy

## 2015-03-17 DIAGNOSIS — R262 Difficulty in walking, not elsewhere classified: Secondary | ICD-10-CM

## 2015-03-17 DIAGNOSIS — M6289 Other specified disorders of muscle: Secondary | ICD-10-CM

## 2015-03-17 NOTE — Therapy (Signed)
[place] [address] [place], [place], [address] Phone: [phone]   Fax:  [phone]  Physical Therapy Treatment  Patient Details  Name: [name] MRN: [id number] Date of Birth: September 22, 1951 Referring Provider:  Netta Cedars, MD  Encounter Date: 03/17/2015      PT End of Session - 03/17/15 1225    Visit Number 2   PT Start Time 0315   PT Stop Time 1240   PT Time Calculation (min) 55 min      Past Medical History  Diagnosis Date  . 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 Chickenpox   . Genital warts   . Environmental allergies   . Heart murmur   . HTN (hypertension)   . Hyperlipidemia   . Thyroid disease     Hypothyroid  . Thyroid nodule     Past Surgical History  Procedure Laterality Date  . Carpal tunnel release      Both wrist  . Cystectomy      From the cervix  . Intramedullary (im) nail intertrochanteric Left 12/15/2014    Procedure: INTRAMEDULLARY (IM) NAIL INTERTROCHANTRIC;  Surgeon: Augustin Schooling, MD;  Location: [facility];  Service: Orthopedics;  Laterality: Left;    There were no vitals filed for this visit.  Visit Diagnosis:  Difficulty walking  Leg fascia tightness      Subjective Assessment - 03/17/15 1147    Symptoms working on keeping toe rotated out, HEP is going okay-painful at times   Pain Score 3    Pain Location Hip   Pain Orientation Left   Pain Descriptors / Indicators Aching                       [facility] Adult PT Treatment/Exercise - 03/17/15 0001    Exercises   Exercises Knee/Hip   Knee/Hip Exercises: Aerobic   Stationary Bike Bike 5   Elliptical Nustep L 5 6 min   Knee/Hip Exercises: Seated   Other Seated Knee Exercises marching green tband 2 sets 10, hip abd gren tband 2 sets 10   Knee/Hip Exercises: Supine   Bridges Strengthening;2 sets;10 reps  with ball squeeze, hip abd supine with green tband 2 sets 10   Other Supine  Knee Exercises bridge with ball,KTC and obligues 2 sets 10   Manual Therapy   Manual Therapy Passive ROM   Passive ROM --  bilateral LE, hip and trunk   Knee/Hip Exercises: Machines for Strengthening   Cybex Knee Extension 5# 2 sets 10   Cybex Knee Flexion 15# 2sets 10   Cybex Leg Press 20# 2 sets 10                PT Education - 03/17/15 1220    Education provided Yes   Education Details issued Left medium heel lift for leg length difference, improved gait   Person(s) Educated Patient   Methods Explanation;Demonstration;Other (comment)   Comprehension Verbalized understanding;Returned demonstration          PT Short Term Goals - 03/10/15 1703    PT SHORT TERM GOAL #1   Title independent with HEP   Time 2   Period Weeks   Status New   PT SHORT TERM GOAL #2   Title walk with walker with step through gait   Time 2   Period Weeks   Status New  PT Long Term Goals - 03/10/15 1704    PT LONG TERM GOAL #1   Title walk with SPC or less assistive device 600 feet   Time 8   Period Weeks   Status New   PT LONG TERM GOAL #2   Title increase left hip flexion to 90 degrees in standing   Time 8   Period Weeks   Status New   PT LONG TERM GOAL #3   Title no pain with ADL's   Time 8   Period Weeks   Status New   PT LONG TERM GOAL #4   Title put on shoes and socks independently   Time 8   Period Weeks   Status New               Plan - 03/17/15 1226    Clinical Impression Statement amb with RW with fwd flexion and left toe in,after issued Left heel lift improved gait. tolerated ther ex well with exception of difficulty getting on and off machines d/t weakness and tightness   PT Next Visit Plan assess heel lift, if pt satisfied she will pay next session. progress ex        Problem List Patient Active Problem List   Diagnosis Date Noted  . Acute blood loss anemia 12/23/2014  . Fracture, intertrochanteric, left femur 12/18/2014  . Fall    . Subtrochanteric fracture of femur 12/16/2014  . UTI (urinary tract infection) 12/16/2014  . Leukocytosis 12/16/2014  . Hip fracture requiring operative repair   . Closed left hip fracture 12/15/2014  . Lesion of right humerus 12/15/2014  . Osteoporosis, unspecified 10/15/2013  . HTN (hypertension) 07/16/2013  . Hypothyroidism 07/16/2013  . Osteopenia 07/16/2013  . Environmental allergies 07/16/2013  . Obesity (BMI 30-39.9) 07/16/2013    Nick Stults,ANGIE PTA 03/17/2015, 12:45 PM  [place] [address] [place], [place], [address] Phone: [phone]   Fax:  [phone]

## 2015-03-19 ENCOUNTER — Encounter: Payer: Self-pay | Admitting: Physical Therapy

## 2015-03-19 ENCOUNTER — Ambulatory Visit: Payer: 59 | Admitting: Physical Therapy

## 2015-03-19 DIAGNOSIS — R262 Difficulty in walking, not elsewhere classified: Secondary | ICD-10-CM

## 2015-03-19 DIAGNOSIS — M25552 Pain in left hip: Secondary | ICD-10-CM

## 2015-03-19 NOTE — Therapy (Signed)
[place], [place], [address] Phone: [phone]   Fax:  [phone]  Physical Therapy Treatment  Patient Details  Name: [name] MRN: [id number] Date of Birth: 10-Jan-1951 Referring Provider:  Netta Cedars, MD  Encounter Date: 03/19/2015      PT End of Session - 03/19/15 1202    Visit Number 3   PT Start Time 5809   PT Stop Time 1240   PT Time Calculation (min) 54 min      Past Medical History  Diagnosis Date  . 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 Chickenpox   . Genital warts   . Environmental allergies   . Heart murmur   . HTN (hypertension)   . Hyperlipidemia   . Thyroid disease     Hypothyroid  . Thyroid nodule     Past Surgical History  Procedure Laterality Date  . Carpal tunnel release      Both wrist  . Cystectomy      From the cervix  . Intramedullary (im) nail intertrochanteric Left 12/15/2014    Procedure: INTRAMEDULLARY (IM) NAIL INTERTROCHANTRIC;  Surgeon: Augustin Schooling, MD;  Location: [facility];  Service: Orthopedics;  Laterality: Left;    There were no vitals filed for this visit.  Visit Diagnosis:  Difficulty walking  Left hip pain      Subjective Assessment - 03/19/15 1149    Symptoms alittle sore after last session, heel lift is feels like it is working well, pt still amb with toeing in- can correct with VCIng and decreasing speed   Currently in Pain? Yes   Pain Score 2    Pain Location Hip   Pain Orientation Left                       [facility] Adult PT Treatment/Exercise - 03/19/15 0001    Knee/Hip Exercises: Aerobic   Stationary Bike Bike 6 min   Elliptical Nustep L 6 6 min   Knee/Hip Exercises: Machines for Strengthening   Cybex Knee Extension 5# 3 sets 10   Cybex Knee Flexion 20# 3 sets 10   Cybex Leg Press 20# 2 sets 15   Knee/Hip Exercises: Standing   Lateral Step Up Left;2 sets;10 reps  4 inch   Forward  Step Up Left;2 sets;10 reps  4 inch   Other Standing Knee Exercises 6 inch box 4 # step touch fwd and sw 2 sets 10 each   Knee/Hip Exercises: Seated   Other Seated Knee Exercises marching green tband 2 sets 15, hip abd green tband 2 sets 15   Other Seated Knee Exercises 2# ER 2 sets 10   Knee/Hip Exercises: Supine   Terminal Knee Extension Strengthening;Left;2 sets;10 reps   Theraband Level (Terminal Knee Extension) Level 2 (Red)  standing   Straight Leg Raises Strengthening;2 sets;Left;10 reps  SLR with abd 2 #   Other Supine Knee Exercises bridge with ball,KTC and obligues 2 sets 10                PT Education - 03/19/15 1202    Education Details pt driving to [place] for [place], educated ongetting out of car and walking around every 60-90 minutes          PT Short Term Goals - 03/19/15 1205    PT SHORT TERM GOAL #1   Title independent with HEP   Status Achieved  PT SHORT TERM GOAL #2   Title walk with walker with step through gait   Status On-going           PT Long Term Goals - 03/10/15 1704    PT LONG TERM GOAL #1   Title walk with SPC or less assistive device 600 feet   Time 8   Period Weeks   Status New   PT LONG TERM GOAL #2   Title increase left hip flexion to 90 degrees in standing   Time 8   Period Weeks   Status New   PT LONG TERM GOAL #3   Title no pain with ADL's   Time 8   Period Weeks   Status New   PT LONG TERM GOAL #4   Title put on shoes and socks independently   Time 8   Period Weeks   Status New               Plan - 03/19/15 1203    Clinical Impression Statement pt requiring VCing and tactile cuing to decrease compensation with Left LE , pt  IR leg and knee falls in. pt weak in knee and hip with decreased TKE        Problem List Patient Active Problem List   Diagnosis Date Noted  . Acute blood loss anemia 12/23/2014  . Fracture, intertrochanteric, left femur 12/18/2014  . Fall   . Subtrochanteric fracture  of femur 12/16/2014  . UTI (urinary tract infection) 12/16/2014  . Leukocytosis 12/16/2014  . Hip fracture requiring operative repair   . Closed left hip fracture 12/15/2014  . Lesion of right humerus 12/15/2014  . Osteoporosis, unspecified 10/15/2013  . HTN (hypertension) 07/16/2013  . Hypothyroidism 07/16/2013  . Osteopenia 07/16/2013  . Environmental allergies 07/16/2013  . Obesity (BMI 30-39.9) 07/16/2013    Tavita Eastham,ANGIE PTA  03/19/2015, 12:27 PM  [place] [address] [place], [place], [address] Phone: [phone]   Fax:  [phone]

## 2015-03-24 ENCOUNTER — Ambulatory Visit: Payer: 59 | Admitting: Physical Therapy

## 2015-03-24 ENCOUNTER — Encounter: Payer: Self-pay | Admitting: Physical Therapy

## 2015-03-24 DIAGNOSIS — R262 Difficulty in walking, not elsewhere classified: Secondary | ICD-10-CM

## 2015-03-24 DIAGNOSIS — M25552 Pain in left hip: Secondary | ICD-10-CM

## 2015-03-24 NOTE — Therapy (Signed)
[address] [place], [place], [address] Phone: [phone]   Fax:  [phone]  Physical Therapy Treatment  Patient Details  Name: [name] MRN: [id number] Date of Birth: 02/27/1951 Referring Provider:  Netta Cedars, MD  Encounter Date: 03/24/2015      PT End of Session - 03/24/15 1053    Visit Number 4   Date for PT Re-Evaluation 05/10/15   PT Start Time 1012   PT Stop Time 1100   PT Time Calculation (min) 48 min      Past Medical History  Diagnosis Date  . 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 Chickenpox   . Genital warts   . Environmental allergies   . Heart murmur   . HTN (hypertension)   . Hyperlipidemia   . Thyroid disease     Hypothyroid  . Thyroid nodule     Past Surgical History  Procedure Laterality Date  . Carpal tunnel release      Both wrist  . Cystectomy      From the cervix  . Intramedullary (im) nail intertrochanteric Left 12/15/2014    Procedure: INTRAMEDULLARY (IM) NAIL INTERTROCHANTRIC;  Surgeon: Augustin Schooling, MD;  Location: [facility];  Service: Orthopedics;  Laterality: Left;    There were no vitals filed for this visit.  Visit Diagnosis:  Difficulty walking  Left hip pain      Subjective Assessment - 03/24/15 1016    Symptoms A little better, still having foot turn in is frustrating   Pertinent History none per patient   Limitations Sitting;Standing;Walking;House hold activities   How long can you sit comfortably? 30   How long can you stand comfortably? 10   How long can you walk comfortably? 4 minutes   Patient Stated Goals walk without pain and be normal, I was walking daily for exercise   Currently in Pain? Yes   Pain Score 2    Pain Location Hip   Pain Orientation Left   Pain Descriptors / Indicators Aching   Pain Type Chronic pain   Pain Onset More than a month ago   Pain Frequency Intermittent   Aggravating Factors   walking   Pain Relieving Factors rest                       [facility] Adult PT Treatment/Exercise - 03/24/15 0001    Ambulation/Gait   Ambulation/Gait Yes   Assistive device Straight cane   Gait velocity slow   Gait Comments cues and HHA needed, needs cues to stand up and decrease the IR of the left hip   High Level Balance   High Level Balance Activities Side stepping;Turns;Other (comment)  fast walking   Knee/Hip Exercises: Aerobic   Stationary Bike Bike 6 min   Elliptical Nustep L 6 6 min   Knee/Hip Exercises: Machines for Strengthening   Cybex Knee Extension 5# 3 sets 10   Cybex Knee Flexion 20# 3 sets 10   Cybex Leg Press 20# 2 sets 15, then left only without weight   Manual Therapy   Manual Therapy Passive ROM   Passive ROM focused on ER of the hip                  PT Short Term Goals - 03/19/15 1205    PT SHORT TERM GOAL #1   Title independent with HEP   Status Achieved  PT SHORT TERM GOAL #2   Title walk with walker with step through gait   Status On-going           PT Long Term Goals - 03/10/15 1704    PT LONG TERM GOAL #1   Title walk with SPC or less assistive device 600 feet   Time 8   Period Weeks   Status New   PT LONG TERM GOAL #2   Title increase left hip flexion to 90 degrees in standing   Time 8   Period Weeks   Status New   PT LONG TERM GOAL #3   Title no pain with ADL's   Time 8   Period Weeks   Status New   PT LONG TERM GOAL #4   Title put on shoes and socks independently   Time 8   Period Weeks   Status New               Plan - 03/24/15 1054    Clinical Impression Statement left leg still turns in, ER is very tight, poor posture with gait, needs cues   Pt will benefit from skilled therapeutic intervention in order to improve on the following deficits Abnormal gait;Decreased activity tolerance;Decreased mobility;Decreased endurance;Decreased range of motion;Decreased strength;Increased muscle  spasms;Difficulty walking;Impaired flexibility;Pain   Rehab Potential Good   PT Frequency 2x / week   PT Duration 6 weeks   PT Treatment/Interventions Electrical Stimulation;Ultrasound;Moist Heat;Gait training;Functional mobility training;Balance training;Therapeutic exercise;Therapeutic activities;Patient/family education;Passive range of motion   PT Next Visit Plan assess heel lift, if pt satisfied she will pay next session. progress ex   Consulted and Agree with Plan of Care Patient        Problem List Patient Active Problem List   Diagnosis Date Noted  . Acute blood loss anemia 12/23/2014  . Fracture, intertrochanteric, left femur 12/18/2014  . Fall   . Subtrochanteric fracture of femur 12/16/2014  . UTI (urinary tract infection) 12/16/2014  . Leukocytosis 12/16/2014  . Hip fracture requiring operative repair   . Closed left hip fracture 12/15/2014  . Lesion of right humerus 12/15/2014  . Osteoporosis, unspecified 10/15/2013  . HTN (hypertension) 07/16/2013  . Hypothyroidism 07/16/2013  . Osteopenia 07/16/2013  . Environmental allergies 07/16/2013  . Obesity (BMI 30-39.9) 07/16/2013    Sumner Boast, PT 03/24/2015, 10:55 AM  [facility] [address] [place], [place], [address] Phone: [phone]   Fax:  [phone]

## 2015-03-26 ENCOUNTER — Encounter: Payer: Self-pay | Admitting: Physical Therapy

## 2015-03-26 ENCOUNTER — Ambulatory Visit: Payer: 59 | Admitting: Physical Therapy

## 2015-03-26 DIAGNOSIS — R262 Difficulty in walking, not elsewhere classified: Secondary | ICD-10-CM

## 2015-03-26 NOTE — Therapy (Signed)
[address] [place], [place], [address] Phone: [phone]   Fax:  [phone]  Physical Therapy Treatment  Patient Details  Name: [name] MRN: [id number] Date of Birth: August 05, 1951 Referring Provider:  Netta Cedars, MD  Encounter Date: 03/26/2015      PT End of Session - 03/26/15 1604    Visit Number 5   Number of Visits 16   PT Start Time 6301   PT Stop Time 1620   PT Time Calculation (min) 55 min      Past Medical History  Diagnosis Date  . 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 Chickenpox   . Genital warts   . Environmental allergies   . Heart murmur   . HTN (hypertension)   . Hyperlipidemia   . Thyroid disease     Hypothyroid  . Thyroid nodule     Past Surgical History  Procedure Laterality Date  . Carpal tunnel release      Both wrist  . Cystectomy      From the cervix  . Intramedullary (im) nail intertrochanteric Left 12/15/2014    Procedure: INTRAMEDULLARY (IM) NAIL INTERTROCHANTRIC;  Surgeon: Augustin Schooling, MD;  Location: [facility];  Service: Orthopedics;  Laterality: Left;    There were no vitals filed for this visit.  Visit Diagnosis:  Difficulty walking      Subjective Assessment - 03/26/15 1531    Symptoms feeling pretty good today, pain with lifting to get in car,tub or on equpment   Currently in Pain? Yes   Pain Location Hip   Pain Orientation Left            [facility] PT Assessment - 03/26/15 0001    AROM   Left Hip Flexion 72  standing   Left Hip ABduction 48  standing   Left Knee Extension 2   Left Knee Flexion 105                   [facility] Adult PT Treatment/Exercise - 03/26/15 0001    Ambulation/Gait   Ambulation/Gait Yes   Ambulation Distance (Feet) --  100 feet 2 times with seated rest   Assistive device Straight cane   Gait velocity slow  working on upright posture and toe straight on Left   Gait Comments cues,  LOB 1 time but regained with wall   Knee/Hip Exercises: Aerobic   Stationary Bike Bike 6 min   Elliptical Nustep L 6 6 min   Knee/Hip Exercises: Machines for Strengthening   Cybex Knee Extension 5# 3 sets 10   Cybex Knee Flexion 20# 3 sets 10   Cybex Leg Press 20# 2 sets 15, then left only without weight   Knee/Hip Exercises: Standing   Other Standing Knee Exercises green tband hip 3 way 15 reps each leg with RW   Knee/Hip Exercises: Seated   Other Seated Knee Exercises 3# marching alternating with RW 15 times, 3 # abd standing with RW 15 times each   Other Seated Knee Exercises 3# IR/ER of Left hip seated 2 sets 10                PT Education - 03/26/15 1603    Education provided Yes   Education Details educated on use of SPC at home   Person(s) Educated Patient   Methods Explanation   Comprehension Verbalized understanding;Returned demonstration  PT Short Term Goals - 03/26/15 1604    PT SHORT TERM GOAL #2   Title walk with walker with step through gait   Status Achieved           PT Long Term Goals - 03/10/15 1704    PT LONG TERM GOAL #1   Title walk with SPC or less assistive device 600 feet   Time 8   Period Weeks   Status New   PT LONG TERM GOAL #2   Title increase left hip flexion to 90 degrees in standing   Time 8   Period Weeks   Status New   PT LONG TERM GOAL #3   Title no pain with ADL's   Time 8   Period Weeks   Status New   PT LONG TERM GOAL #4   Title put on shoes and socks independently   Time 8   Period Weeks   Status New               Plan - 03/26/15 1604    Clinical Impression Statement pt with increased hip ROM, continues to have pain with lateral mvmt        Problem List Patient Active Problem List   Diagnosis Date Noted  . Acute blood loss anemia 12/23/2014  . Fracture, intertrochanteric, left femur 12/18/2014  . Fall   . Subtrochanteric fracture of femur 12/16/2014  . UTI (urinary tract infection)  12/16/2014  . Leukocytosis 12/16/2014  . Hip fracture requiring operative repair   . Closed left hip fracture 12/15/2014  . Lesion of right humerus 12/15/2014  . Osteoporosis, unspecified 10/15/2013  . HTN (hypertension) 07/16/2013  . Hypothyroidism 07/16/2013  . Osteopenia 07/16/2013  . Environmental allergies 07/16/2013  . Obesity (BMI 30-39.9) 07/16/2013    PAYSEUR,ANGIE PTA 03/26/2015, 4:07 PM  [place] [address] [place], [place], [address] Phone: [phone]   Fax:  [phone]

## 2015-03-30 ENCOUNTER — Ambulatory Visit: Payer: 59 | Attending: Orthopedic Surgery | Admitting: Physical Therapy

## 2015-03-30 ENCOUNTER — Encounter: Payer: Self-pay | Admitting: Physical Therapy

## 2015-03-30 DIAGNOSIS — M629 Disorder of muscle, unspecified: Secondary | ICD-10-CM | POA: Insufficient documentation

## 2015-03-30 DIAGNOSIS — M25552 Pain in left hip: Secondary | ICD-10-CM | POA: Diagnosis not present

## 2015-03-30 DIAGNOSIS — R262 Difficulty in walking, not elsewhere classified: Secondary | ICD-10-CM | POA: Diagnosis present

## 2015-03-30 DIAGNOSIS — M6289 Other specified disorders of muscle: Secondary | ICD-10-CM

## 2015-03-30 NOTE — Therapy (Signed)
[place] [place] [address] [place], [place], [address] Phone: [phone]   Fax:  [phone]  Physical Therapy Treatment  Patient Details  Name: [name] MRN: [id number] Date of Birth: [birth year] Referring Provider:  Netta Cedars, MD  Encounter Date: 03/30/2015      PT End of Session - 03/30/15 1039    Visit Number 6   Date for PT Re-Evaluation 05/10/15   PT Start Time 0930   PT Stop Time 1018   PT Time Calculation (min) 48 min      Past Medical History  Diagnosis Date  . 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 Chickenpox   . Genital warts   . Environmental allergies   . Heart murmur   . HTN (hypertension)   . Hyperlipidemia   . Thyroid disease     Hypothyroid  . Thyroid nodule     Past Surgical History  Procedure Laterality Date  . Carpal tunnel release      Both wrist  . Cystectomy      From the cervix  . Intramedullary (im) nail intertrochanteric Left 12/15/2014    Procedure: INTRAMEDULLARY (IM) NAIL INTERTROCHANTRIC;  Surgeon: Emily Schooling, MD;  Location: [facility];  Service: Orthopedics;  Laterality: Left;    There were no vitals filed for this visit.  Visit Diagnosis:  Difficulty walking  Left hip pain  Leg fascia tightness                     [facility] Adult PT Treatment/Exercise - 03/30/15 0001    Ambulation/Gait   Ambulation Distance (Feet) 500 Feet   Assistive device Straight cane   Gait velocity worked on speed with HHA   High Level Balance   High Level Balance Activities Side stepping;Backward walking;Direction changes   Knee/Hip Exercises: Stretches   Passive Hamstring Stretch 30 seconds;3 reps   Hip Flexor Stretch 30 seconds;5 reps   ITB Stretch 3 reps;20 seconds   Piriformis Stretch 30 seconds;5 reps   Gastroc Stretch 20 seconds;3 reps   Knee/Hip Exercises: Aerobic   Stationary Bike Bike 6 min   Elliptical Nustep L 6 6 min   Knee/Hip  Exercises: Machines for Strengthening   Cybex Knee Extension 5# 3 sets 10   Cybex Knee Flexion 20# 3 sets 10   Cybex Leg Press 20# 2 sets 15, then left only without weight   Knee/Hip Exercises: Supine   Terminal Knee Extension Strengthening;Left   Bridges Strengthening;2 sets;10 reps   Straight Leg Raises Strengthening;2 sets;Left;10 reps   Other Supine Knee Exercises bridge with ball,KTC and obligues 2 sets 10                  PT Short Term Goals - 03/26/15 1604    PT SHORT TERM GOAL #2   Title walk with walker with step through gait   Status Achieved           PT Long Term Goals - 03/30/15 1040    PT LONG TERM GOAL #1   Title walk with SPC or less assistive device 600 feet   Status On-going   PT LONG TERM GOAL #2   Title increase left hip flexion to 90 degrees in standing   Status On-going               Plan - 03/30/15 1039    Clinical Impression Statement She has very tight Hip  flexor and piriformis, this caused her to have poor gait pattern   Pt will benefit from skilled therapeutic intervention in order to improve on the following deficits Abnormal gait;Decreased activity tolerance;Decreased mobility;Decreased endurance;Decreased range of motion;Decreased strength;Increased muscle spasms;Difficulty walking;Impaired flexibility;Pain   Rehab Potential Good   PT Frequency 2x / week   PT Duration 6 weeks   PT Treatment/Interventions Electrical Stimulation;Ultrasound;Moist Heat;Gait training;Functional mobility training;Balance training;Therapeutic exercise;Therapeutic activities;Patient/family education;Passive range of motion   PT Next Visit Plan work on flexibility   Consulted and Agree with Plan of Care Patient        Problem List Patient Active Problem List   Diagnosis Date Noted  . Acute blood loss anemia 12/23/2014  . Fracture, intertrochanteric, left femur 12/18/2014  . Fall   . Subtrochanteric fracture of femur 12/16/2014  . UTI (urinary  tract infection) 12/16/2014  . Leukocytosis 12/16/2014  . Hip fracture requiring operative repair   . Closed left hip fracture 12/15/2014  . Lesion of right humerus 12/15/2014  . Osteoporosis, unspecified 10/15/2013  . HTN (hypertension) 07/16/2013  . Hypothyroidism 07/16/2013  . Osteopenia 07/16/2013  . Environmental allergies 07/16/2013  . Obesity (BMI 30-39.9) 07/16/2013    Sumner Boast, PT 03/30/2015, 10:41 AM  [address] [place], [place], [address] Phone: [phone]   Fax:  [phone]

## 2015-04-01 ENCOUNTER — Encounter: Payer: Self-pay | Admitting: Physical Therapy

## 2015-04-01 ENCOUNTER — Ambulatory Visit: Payer: 59 | Admitting: Physical Therapy

## 2015-04-01 DIAGNOSIS — M6289 Other specified disorders of muscle: Secondary | ICD-10-CM

## 2015-04-01 DIAGNOSIS — M25552 Pain in left hip: Secondary | ICD-10-CM

## 2015-04-01 DIAGNOSIS — R262 Difficulty in walking, not elsewhere classified: Secondary | ICD-10-CM | POA: Diagnosis not present

## 2015-04-01 NOTE — Therapy (Signed)
[place] [place], [place], [address] Phone: [phone]   Fax:  [phone]  Physical Therapy Treatment  Patient Details  Name: [name] MRN: [id number] Date of Birth: Jul 14, 1951 Referring Provider:  Netta Cedars, MD  Encounter Date: 04/01/2015      PT End of Session - 04/01/15 1657    Visit Number 7   Date for PT Re-Evaluation 05/10/15   PT Start Time 2094   PT Stop Time 1704   PT Time Calculation (min) 49 min      Past Medical History  Diagnosis Date  . 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 Chickenpox   . Genital warts   . Environmental allergies   . Heart murmur   . HTN (hypertension)   . Hyperlipidemia   . Thyroid disease     Hypothyroid  . Thyroid nodule     Past Surgical History  Procedure Laterality Date  . Carpal tunnel release      Both wrist  . Cystectomy      From the cervix  . Intramedullary (im) nail intertrochanteric Left 12/15/2014    Procedure: INTRAMEDULLARY (IM) NAIL INTERTROCHANTRIC;  Surgeon: Augustin Schooling, MD;  Location: [facility];  Service: Orthopedics;  Laterality: Left;    There were no vitals filed for this visit.  Visit Diagnosis:  Difficulty walking  Left hip pain  Leg fascia tightness      Subjective Assessment - 04/01/15 1623    Subjective Feeling good today, sometimes it hurts at night   Patient Stated Goals walk without pain and be normal, I was walking daily for exercise                       [facility] Adult PT Treatment/Exercise - 04/01/15 0001    Ambulation/Gait   Ambulation Distance (Feet) 500 Feet   Assistive device Straight cane   Gait velocity worked on speed with HHA   Gait Comments needs cues for arm swing, posture and step length   Knee/Hip Exercises: Aerobic   Elliptical Nustep L 6 6 min   Knee/Hip Exercises: Machines for Strengthening   Cybex Knee Extension 5# 3 sets 10   Cybex Knee Flexion 25#  3 sets 10   Cybex Leg Press 20# 2 sets 15, then left only without weight   Knee/Hip Exercises: Standing   Other Standing Knee Exercises 6" and 10" toe clears, in standing, 4" toe clears in sitting   Knee/Hip Exercises: Seated   Other Seated Knee Exercises 3# marching alternating with RW 15 times, 3 # abd standing with RW 15 times each   Knee/Hip Exercises: Supine   Bridges Strengthening;2 sets;10 reps   Straight Leg Raises Strengthening;2 sets;Left;10 reps   Manual Therapy   Manual Therapy Passive ROM   Passive ROM worked on adductors, HS, piriformis and hip flexors                  PT Short Term Goals - 03/26/15 1604    PT SHORT TERM GOAL #2   Title walk with walker with step through gait   Status Achieved           PT Long Term Goals - 03/30/15 1040    PT LONG TERM GOAL #1   Title walk with SPC or less assistive device 600 feet   Status On-going   PT LONG TERM GOAL #2   Title  increase left hip flexion to 90 degrees in standing   Status On-going               Plan - 04/01/15 1657    Clinical Impression Statement Overall she is starting to move and walk better with less c/o pain, she is very fearful and gaurded, she is extremely tight in the hip flexor and piriformis   Pt will benefit from skilled therapeutic intervention in order to improve on the following deficits Abnormal gait;Decreased activity tolerance;Decreased mobility;Decreased endurance;Decreased range of motion;Decreased strength;Increased muscle spasms;Difficulty walking;Impaired flexibility;Pain   Rehab Potential Good   PT Frequency 2x / week   PT Duration 6 weeks   PT Treatment/Interventions Electrical Stimulation;Ultrasound;Moist Heat;Gait training;Functional mobility training;Balance training;Therapeutic exercise;Therapeutic activities;Patient/family education;Passive range of motion   PT Next Visit Plan work on flexibility   Consulted and Agree with Plan of Care Patient      Currently  working on PROM and gait.   Sumner Boast, PT 04/01/2015, 4:59 PM  [place] [address] [place], [place], [address] Phone: [phone]   Fax:  [phone]

## 2015-04-06 ENCOUNTER — Ambulatory Visit: Payer: 59 | Admitting: Physical Therapy

## 2015-04-06 ENCOUNTER — Encounter: Payer: Self-pay | Admitting: Physical Therapy

## 2015-04-06 DIAGNOSIS — M25552 Pain in left hip: Secondary | ICD-10-CM

## 2015-04-06 DIAGNOSIS — R262 Difficulty in walking, not elsewhere classified: Secondary | ICD-10-CM

## 2015-04-06 DIAGNOSIS — M6289 Other specified disorders of muscle: Secondary | ICD-10-CM

## 2015-04-06 NOTE — Therapy (Signed)
[place], [place], [address] Phone: [phone]   Fax:  [phone]  Physical Therapy Treatment  Patient Details  Name: [name] MRN: [id number] Date of Birth: [birth year] Referring Provider:  Netta Cedars, MD  Encounter Date: 04/06/2015      PT End of Session - 04/06/15 1658    Visit Number 8   Date for PT Re-Evaluation 05/10/15   PT Start Time 6256   PT Stop Time 3893   PT Time Calculation (min) 55 min      Past Medical History  Diagnosis Date  . 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 Chickenpox   . Genital warts   . Environmental allergies   . Heart murmur   . HTN (hypertension)   . Hyperlipidemia   . Thyroid disease     Hypothyroid  . Thyroid nodule     Past Surgical History  Procedure Laterality Date  . Carpal tunnel release      Both wrist  . Cystectomy      From the cervix  . Intramedullary (im) nail intertrochanteric Left 12/15/2014    Procedure: INTRAMEDULLARY (IM) NAIL INTERTROCHANTRIC;  Surgeon: Augustin Schooling, MD;  Location: [facility];  Service: Orthopedics;  Laterality: Left;    There were no vitals filed for this visit.  Visit Diagnosis:  Difficulty walking  Left hip pain  Leg fascia tightness      Subjective Assessment - 04/06/15 1621    Subjective I saw MD, he is pleased.  MD note says to continue with stretching especially trying to gain ER and gait.   Patient Stated Goals walk without pain and be normal, I was walking daily for exercise   Currently in Pain? Yes   Pain Score 2    Pain Location Hip   Pain Orientation Left   Pain Descriptors / Indicators Aching   Pain Type Chronic pain   Pain Onset More than a month ago   Pain Frequency Intermittent                       [facility] Adult PT Treatment/Exercise - 04/06/15 0001    Ambulation/Gait   Ambulation Distance (Feet) 500 Feet   Assistive device Straight cane   Gait velocity worked on speed with HHA   Gait Comments needs cues for arm swing, posture and step length   High Level Balance   High Level Balance Activities Side stepping;Backward walking;Direction changes   Knee/Hip Exercises: Stretches   Passive Hamstring Stretch 30 seconds;3 reps   Hip Flexor Stretch 30 seconds;5 reps   Piriformis Stretch 30 seconds;5 reps   Knee/Hip Exercises: Aerobic   Stationary Bike Bike 6 min   Elliptical Nustep L 6 6 min   Knee/Hip Exercises: Machines for Strengthening   Cybex Knee Extension 5# 3 sets 10   Cybex Knee Flexion 25# 3 sets 10   Cybex Leg Press 20# 2 sets 15, then left only without weight   Knee/Hip Exercises: Supine   Other Supine Knee Exercises bridge with ball,KTC and obligues 2 sets 10   Manual Therapy   Manual Therapy Passive ROM   Passive ROM worked on adductors, HS, piriformis and hip flexors                  PT Short Term Goals - 03/26/15 1604    PT SHORT TERM GOAL #2   Title walk  with walker with step through gait   Status Achieved           PT Long Term Goals - 03/30/15 1040    PT LONG TERM GOAL #1   Title walk with SPC or less assistive device 600 feet   Status On-going   PT LONG TERM GOAL #2   Title increase left hip flexion to 90 degrees in standing   Status On-going               Plan - 04/06/15 1754    Clinical Impression Statement Extremely tight in the left hip   Pt will benefit from skilled therapeutic intervention in order to improve on the following deficits Abnormal gait;Decreased activity tolerance;Decreased mobility;Decreased endurance;Decreased range of motion;Decreased strength;Increased muscle spasms;Difficulty walking;Impaired flexibility;Pain   Rehab Potential Good   PT Frequency 2x / week   PT Duration 6 weeks   PT Treatment/Interventions Electrical Stimulation;Ultrasound;Moist Heat;Gait training;Functional mobility training;Balance training;Therapeutic exercise;Therapeutic  activities;Patient/family education;Passive range of motion   PT Next Visit Plan work on flexibility   Consulted and Agree with Plan of Care Patient        Problem List Patient Active Problem List   Diagnosis Date Noted  . Acute blood loss anemia 12/23/2014  . Fracture, intertrochanteric, left femur 12/18/2014  . Fall   . Subtrochanteric fracture of femur 12/16/2014  . UTI (urinary tract infection) 12/16/2014  . Leukocytosis 12/16/2014  . Hip fracture requiring operative repair   . Closed left hip fracture 12/15/2014  . Lesion of right humerus 12/15/2014  . Osteoporosis, unspecified 10/15/2013  . HTN (hypertension) 07/16/2013  . Hypothyroidism 07/16/2013  . Osteopenia 07/16/2013  . Environmental allergies 07/16/2013  . Obesity (BMI 30-39.9) 07/16/2013    Sumner Boast, PT 04/06/2015, 5:55 PM  [place] [address] [place], [place], [address] Phone: [phone]   Fax:  [phone]

## 2015-04-08 ENCOUNTER — Ambulatory Visit: Payer: 59 | Admitting: Physical Therapy

## 2015-04-08 DIAGNOSIS — R262 Difficulty in walking, not elsewhere classified: Secondary | ICD-10-CM

## 2015-04-08 DIAGNOSIS — M25552 Pain in left hip: Secondary | ICD-10-CM

## 2015-04-08 DIAGNOSIS — M6289 Other specified disorders of muscle: Secondary | ICD-10-CM

## 2015-04-08 NOTE — Therapy (Signed)
[place], [place], [address] Phone: [phone]   Fax:  [phone]  Physical Therapy Treatment  Patient Details  Name: [name] MRN: [id number] Date of Birth: 09/15/1951 Referring Provider:  Netta Cedars, MD  Encounter Date: 04/08/2015      PT End of Session - 04/08/15 1625    Visit Number 9   Number of Visits 16   Date for PT Re-Evaluation 05/10/15   PT Start Time 4268   PT Stop Time 1624   PT Time Calculation (min) 49 min   Activity Tolerance Patient tolerated treatment well   Behavior During Therapy [facility] for tasks assessed/performed      Past Medical History  Diagnosis Date  . 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 Chickenpox   . Genital warts   . Environmental allergies   . Heart murmur   . HTN (hypertension)   . Hyperlipidemia   . Thyroid disease     Hypothyroid  . Thyroid nodule     Past Surgical History  Procedure Laterality Date  . Carpal tunnel release      Both wrist  . Cystectomy      From the cervix  . Intramedullary (im) nail intertrochanteric Left 12/15/2014    Procedure: INTRAMEDULLARY (IM) NAIL INTERTROCHANTRIC;  Surgeon: Augustin Schooling, MD;  Location: [facility];  Service: Orthopedics;  Laterality: Left;    There were no vitals filed for this visit.  Visit Diagnosis:  Difficulty walking  Left hip pain  Leg fascia tightness      Subjective Assessment - 04/08/15 1539    Subjective feeling better than Monday; only having pain in knee   Patient Stated Goals walk without pain and be normal, I was walking daily for exercise   Currently in Pain? Yes   Pain Score 3    Pain Location Knee   Pain Orientation Left                       [facility] Adult PT Treatment/Exercise - 04/08/15 1540    Knee/Hip Exercises: Aerobic   Stationary Bike Bike 6 min   Elliptical Nustep L 5 6 min   Knee/Hip Exercises: Machines for Strengthening    Cybex Knee Extension 10# 2x15   Cybex Knee Flexion 25# 2x15   Knee/Hip Exercises: Standing   Other Standing Knee Exercises resistive walking: sidestepping with green theraband   Knee/Hip Exercises: Seated   Other Seated Knee Exercises isometric hip addct/ball squeeze 3x10   Knee/Hip Exercises: Supine   Other Supine Knee Exercises hip addct stretch 5x10 sec   Manual Therapy   Manual Therapy Passive ROM   Passive ROM worked on adductors, HS, piriformis and hip flexors                  PT Short Term Goals - 03/26/15 1604    PT SHORT TERM GOAL #2   Title walk with walker with step through gait   Status Achieved           PT Long Term Goals - 03/30/15 1040    PT LONG TERM GOAL #1   Title walk with SPC or less assistive device 600 feet   Status On-going   PT LONG TERM GOAL #2   Title increase left hip flexion to 90 degrees in standing   Status On-going  Plan - 04/08/15 1626    Clinical Impression Statement Pt tolerated session well with decrease in pain following stretching.  Flexibility and motion improving.  Will cont to benefit from PT to maximize function and mobility.   PT Next Visit Plan incorporate balance; weight bearing strengthening; cont stretches   Consulted and Agree with Plan of Care Patient        Problem List Patient Active Problem List   Diagnosis Date Noted  . Acute blood loss anemia 12/23/2014  . Fracture, intertrochanteric, left femur 12/18/2014  . Fall   . Subtrochanteric fracture of femur 12/16/2014  . UTI (urinary tract infection) 12/16/2014  . Leukocytosis 12/16/2014  . Hip fracture requiring operative repair   . Closed left hip fracture 12/15/2014  . Lesion of right humerus 12/15/2014  . Osteoporosis, unspecified 10/15/2013  . HTN (hypertension) 07/16/2013  . Hypothyroidism 07/16/2013  . Osteopenia 07/16/2013  . Environmental allergies 07/16/2013  . Obesity (BMI 30-39.9) 07/16/2013   Laureen Abrahams, PT, DPT 04/08/2015 4:29 PM  [place] [address] [place], [place], [address] Phone: [phone]   Fax:  [phone]

## 2015-04-13 ENCOUNTER — Ambulatory Visit: Payer: 59 | Admitting: Physical Therapy

## 2015-04-13 DIAGNOSIS — M6289 Other specified disorders of muscle: Secondary | ICD-10-CM

## 2015-04-13 DIAGNOSIS — R262 Difficulty in walking, not elsewhere classified: Secondary | ICD-10-CM

## 2015-04-13 DIAGNOSIS — M25552 Pain in left hip: Secondary | ICD-10-CM

## 2015-04-13 NOTE — Therapy (Signed)
[place], [place], [address] Phone: [phone]   Fax:  [phone]  Physical Therapy Treatment  Patient Details  Name: [name] MRN: [id number] Date of Birth: 1951-08-29 Referring Provider:  Netta Cedars, MD  Encounter Date: 04/13/2015      PT End of Session - 04/13/15 1703    Visit Number 10   Number of Visits 16   Date for PT Re-Evaluation 05/10/15   PT Start Time 5329   PT Stop Time 1700   PT Time Calculation (min) 45 min   Activity Tolerance Patient tolerated treatment well   Behavior During Therapy [facility] for tasks assessed/performed      Past Medical History  Diagnosis Date  . 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 Chickenpox   . Genital warts   . Environmental allergies   . Heart murmur   . HTN (hypertension)   . Hyperlipidemia   . Thyroid disease     Hypothyroid  . Thyroid nodule     Past Surgical History  Procedure Laterality Date  . Carpal tunnel release      Both wrist  . Cystectomy      From the cervix  . Intramedullary (im) nail intertrochanteric Left 12/15/2014    Procedure: INTRAMEDULLARY (IM) NAIL INTERTROCHANTRIC;  Surgeon: Augustin Schooling, MD;  Location: [facility];  Service: Orthopedics;  Laterality: Left;    There were no vitals filed for this visit.  Visit Diagnosis:  Difficulty walking  Left hip pain  Leg fascia tightness      Subjective Assessment - 04/13/15 1618    Subjective no pain currently; had a little pain this AM   Currently in Pain? No/denies                         [facility] Adult PT Treatment/Exercise - 04/13/15 1622    Knee/Hip Exercises: Aerobic   Stationary Bike Bike 6 min   Elliptical Nustep L 6 x 6 min   Knee/Hip Exercises: Machines for Strengthening   Cybex Knee Extension 15# 3x10   Cybex Knee Flexion 25# 3x10; 35# x10   Cybex Leg Press 20# x10; 40# 2x10; strap used for cues to maintain  neutral hip and decrease int rotation   Knee/Hip Exercises: Standing   SLS with bil UE support: single tap to cones   Other Standing Knee Exercises resistive walking: sidestepping with green theraband   Manual Therapy   Manual Therapy Passive ROM   Passive ROM worked on adductors, piriformis and hip flexors                  PT Short Term Goals - 03/26/15 1604    PT SHORT TERM GOAL #2   Title walk with walker with step through gait   Status Achieved           PT Long Term Goals - 03/30/15 1040    PT LONG TERM GOAL #1   Title walk with SPC or less assistive device 600 feet   Status On-going   PT LONG TERM GOAL #2   Title increase left hip flexion to 90 degrees in standing   Status On-going               Plan - 04/13/15 1704    Clinical Impression Statement Able to progress standing exercises today.  Needs a significant amount of UE support for  balance activities.   PT Next Visit Plan incorporate balance; weight bearing strengthening; cont stretches   Consulted and Agree with Plan of Care Patient        Problem List Patient Active Problem List   Diagnosis Date Noted  . Acute blood loss anemia 12/23/2014  . Fracture, intertrochanteric, left femur 12/18/2014  . Fall   . Subtrochanteric fracture of femur 12/16/2014  . UTI (urinary tract infection) 12/16/2014  . Leukocytosis 12/16/2014  . Hip fracture requiring operative repair   . Closed left hip fracture 12/15/2014  . Lesion of right humerus 12/15/2014  . Osteoporosis, unspecified 10/15/2013  . HTN (hypertension) 07/16/2013  . Hypothyroidism 07/16/2013  . Osteopenia 07/16/2013  . Environmental allergies 07/16/2013  . Obesity (BMI 30-39.9) 07/16/2013   Laureen Abrahams, PT, DPT 04/13/2015 5:05 PM  [place] [address] [address] [place], [place], [address] Phone: [phone]   Fax:  [phone]

## 2015-04-15 ENCOUNTER — Ambulatory Visit: Payer: 59 | Admitting: Physical Therapy

## 2015-04-15 DIAGNOSIS — R262 Difficulty in walking, not elsewhere classified: Secondary | ICD-10-CM

## 2015-04-15 DIAGNOSIS — M25552 Pain in left hip: Secondary | ICD-10-CM

## 2015-04-15 DIAGNOSIS — M6289 Other specified disorders of muscle: Secondary | ICD-10-CM

## 2015-04-15 NOTE — Therapy (Signed)
[place] [address] [place], [place], [address] Phone: [phone]   Fax:  [phone]  Physical Therapy Treatment  Patient Details  Name: [name] MRN: [id number] Date of Birth: [birth year] Referring Provider:  Netta Cedars, MD  Encounter Date: 04/15/2015      PT End of Session - 04/15/15 1609    Visit Number 11   Number of Visits 16   Date for PT Re-Evaluation 05/10/15   PT Start Time 6734   PT Stop Time 1610   PT Time Calculation (min) 39 min   Activity Tolerance Patient tolerated treatment well   Behavior During Therapy [facility] for tasks assessed/performed      Past Medical History  Diagnosis Date  . 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 Chickenpox   . Genital warts   . Environmental allergies   . Heart murmur   . HTN (hypertension)   . Hyperlipidemia   . Thyroid disease     Hypothyroid  . Thyroid nodule     Past Surgical History  Procedure Laterality Date  . Carpal tunnel release      Both wrist  . Cystectomy      From the cervix  . Intramedullary (im) nail intertrochanteric Left 12/15/2014    Procedure: INTRAMEDULLARY (IM) NAIL INTERTROCHANTRIC;  Surgeon: Augustin Schooling, MD;  Location: [facility];  Service: Orthopedics;  Laterality: Left;    There were no vitals filed for this visit.  Visit Diagnosis:  Difficulty walking  Left hip pain  Leg fascia tightness      Subjective Assessment - 04/15/15 1547    Subjective walked into the building without the walker; used cane only.  feeling good today   Currently in Pain? Yes   Pain Score 2    Pain Location Hip   Pain Orientation Left   Pain Descriptors / Indicators Aching                         [facility] Adult PT Treatment/Exercise - 04/15/15 1548    Ambulation/Gait   Ambulation/Gait Yes   Gait Pattern --  L knee valgus; L supination: L lateral whip   Ambulation Surface Level;Indoor   Gait Comments  visual cues (mirror) to assess mechanics; cues for wider BOS and central heel strike (feel supination likely chronic due to L knee OA and significant valgus of knee)   Knee/Hip Exercises: Aerobic   Stationary Bike Bike 6 min   Elliptical Nustep L 6 x 6 min   Knee/Hip Exercises: Machines for Strengthening   Cybex Leg Press 40# 3x10; strap used for cues to maintain neutral hip and decrease int rotation   Manual Therapy   Manual Therapy Passive ROM   Passive ROM worked on adductors, piriformis and hip flexors                  PT Short Term Goals - 03/26/15 1604    PT SHORT TERM GOAL #2   Title walk with walker with step through gait   Status Achieved           PT Long Term Goals - 03/30/15 1040    PT LONG TERM GOAL #1   Title walk with SPC or less assistive device 600 feet   Status On-going   PT LONG TERM GOAL #2   Title increase left hip flexion to 90 degrees in standing   Status  On-going               Plan - 04/15/15 1610    Clinical Impression Statement Pt demonstrates significant gait deviations which is likely a combination of chronic conditions exacerbated by recent injury.  Will continue to benefit from PT to maximize function and decrease pain.   PT Next Visit Plan incorporate balance; weight bearing strengthening; cont stretches        Problem List Patient Active Problem List   Diagnosis Date Noted  . Acute blood loss anemia 12/23/2014  . Fracture, intertrochanteric, left femur 12/18/2014  . Fall   . Subtrochanteric fracture of femur 12/16/2014  . UTI (urinary tract infection) 12/16/2014  . Leukocytosis 12/16/2014  . Hip fracture requiring operative repair   . Closed left hip fracture 12/15/2014  . Lesion of right humerus 12/15/2014  . Osteoporosis, unspecified 10/15/2013  . HTN (hypertension) 07/16/2013  . Hypothyroidism 07/16/2013  . Osteopenia 07/16/2013  . Environmental allergies 07/16/2013  . Obesity (BMI 30-39.9) 07/16/2013    Laureen Abrahams, PT, DPT 04/15/2015 4:15 PM  [place] [address] [place], [place], [address] Phone: [phone]   Fax:  [phone]

## 2015-04-20 ENCOUNTER — Ambulatory Visit: Payer: 59 | Admitting: Physical Therapy

## 2015-04-20 DIAGNOSIS — R262 Difficulty in walking, not elsewhere classified: Secondary | ICD-10-CM | POA: Diagnosis not present

## 2015-04-20 DIAGNOSIS — M25552 Pain in left hip: Secondary | ICD-10-CM

## 2015-04-20 NOTE — Therapy (Signed)
[place], [place], [address] Phone: [phone]   Fax:  [phone]  Physical Therapy Treatment  Patient Details  Name: [name] MRN: [id number] Date of Birth: [birth year] Referring Provider:  Crist Infante, MD  Encounter Date: 04/20/2015      PT End of Session - 04/20/15 1608    Visit Number 12   Number of Visits 16   Date for PT Re-Evaluation 05/10/15   PT Start Time 1532   PT Stop Time 1412   PT Time Calculation (min) 1360 min   Activity Tolerance Patient tolerated treatment well   Behavior During Therapy [facility] for tasks assessed/performed      Past Medical History  Diagnosis Date  . 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 Chickenpox   . Genital warts   . Environmental allergies   . Heart murmur   . HTN (hypertension)   . Hyperlipidemia   . Thyroid disease     Hypothyroid  . Thyroid nodule     Past Surgical History  Procedure Laterality Date  . Carpal tunnel release      Both wrist  . Cystectomy      From the cervix  . Intramedullary (im) nail intertrochanteric Left 12/15/2014    Procedure: INTRAMEDULLARY (IM) NAIL INTERTROCHANTRIC;  Surgeon: Augustin Schooling, MD;  Location: [facility];  Service: Orthopedics;  Laterality: Left;    There were no vitals filed for this visit.  Visit Diagnosis:  Left hip pain      Subjective Assessment - 04/20/15 1532    Subjective not doing to good today. has been building up over the weekend. stood up and walked around alot over the weekend. not sure if that is what it was. using walker today.  been using SPC untril today. gets worse when she puts weight on it.   Pertinent History none per patient   Limitations Sitting;Standing;Walking;House hold activities   Currently in Pain? Yes   Pain Score 8    Pain Location Hip  from left hip down into knee   Pain Orientation Left   Pain Descriptors / Indicators Sharp;Shooting   Pain Type Chronic pain                         [facility] Adult PT Treatment/Exercise - 04/20/15 0001    Ambulation/Gait   Ambulation/Gait Yes   Assistive device Rolling walker   Gait Pattern Step-through pattern   Ambulation Surface Level   Gait Comments cues for upright posture   Knee/Hip Exercises: Stretches   Passive Hamstring Stretch 2 reps;30 seconds   Piriformis Stretch 2 reps;30 seconds  passive stetch   Knee/Hip Exercises: Aerobic   Stationary Bike bike 6  1/2 revolutions due to pain increase wtih full revolution                  PT Short Term Goals - 03/26/15 1604    PT SHORT TERM GOAL #2   Title walk with walker with step through gait   Status Achieved           PT Long Term Goals - 03/30/15 1040    PT LONG TERM GOAL #1   Title walk with SPC or less assistive device 600 feet   Status On-going   PT LONG TERM GOAL #2   Title increase left hip flexion to 90 degrees in standing   Status  On-going               Plan - 04/20/15 1606    Clinical Impression Statement knee pain decreased to 3/10. Hip feels much better with gentle ther ex today.   Pt will benefit from skilled therapeutic intervention in order to improve on the following deficits Abnormal gait;Decreased activity tolerance;Decreased mobility;Decreased endurance;Decreased range of motion;Decreased strength;Increased muscle spasms;Difficulty walking;Impaired flexibility;Pain   Rehab Potential Good   PT Frequency 2x / week   PT Duration 6 weeks   PT Next Visit Plan incorporate balance; weight bearing strengthening; cont stretches if patient is feeling better than this treatment visit   Consulted and Agree with Plan of Care Patient        Problem List Patient Active Problem List   Diagnosis Date Noted  . Acute blood loss anemia 12/23/2014  . Fracture, intertrochanteric, left femur 12/18/2014  . Fall   . Subtrochanteric fracture of femur 12/16/2014  . UTI (urinary  tract infection) 12/16/2014  . Leukocytosis 12/16/2014  . Hip fracture requiring operative repair   . Closed left hip fracture 12/15/2014  . Lesion of right humerus 12/15/2014  . Osteoporosis, unspecified 10/15/2013  . HTN (hypertension) 07/16/2013  . Hypothyroidism 07/16/2013  . Osteopenia 07/16/2013  . Environmental allergies 07/16/2013  . Obesity (BMI 30-39.9) 07/16/2013     Natividad Brood, PTA  04/20/2015, 4:12 PM  [place] [address] [place], [place], [address] Phone: [phone]   Fax:  [phone]

## 2015-04-21 ENCOUNTER — Telehealth: Payer: Self-pay

## 2015-04-22 ENCOUNTER — Inpatient Hospital Stay (HOSPITAL_COMMUNITY)
Admission: EM | Admit: 2015-04-22 | Discharge: 2015-04-28 | DRG: 470 | Disposition: A | Discharge status: Rehabilitation Facility | Payer: 59 | Attending: Internal Medicine | Admitting: Internal Medicine

## 2015-04-22 ENCOUNTER — Ambulatory Visit: Payer: 59 | Admitting: Physical Therapy

## 2015-04-22 ENCOUNTER — Encounter (HOSPITAL_COMMUNITY): Payer: Self-pay | Admitting: *Deleted

## 2015-04-22 ENCOUNTER — Inpatient Hospital Stay (HOSPITAL_COMMUNITY): Payer: 59

## 2015-04-22 ENCOUNTER — Emergency Department (HOSPITAL_COMMUNITY): Payer: 59

## 2015-04-22 DIAGNOSIS — W1830XA Fall on same level, unspecified, initial encounter: Secondary | ICD-10-CM | POA: Diagnosis present

## 2015-04-22 DIAGNOSIS — E039 Hypothyroidism, unspecified: Secondary | ICD-10-CM | POA: Diagnosis not present

## 2015-04-22 DIAGNOSIS — Y792 Prosthetic and other implants, materials and accessory orthopedic devices associated with adverse incidents: Secondary | ICD-10-CM | POA: Diagnosis present

## 2015-04-22 DIAGNOSIS — S72009A Fracture of unspecified part of neck of unspecified femur, initial encounter for closed fracture: Secondary | ICD-10-CM | POA: Diagnosis present

## 2015-04-22 DIAGNOSIS — S72002A Fracture of unspecified part of neck of left femur, initial encounter for closed fracture: Secondary | ICD-10-CM | POA: Diagnosis not present

## 2015-04-22 DIAGNOSIS — Z7951 Long term (current) use of inhaled steroids: Secondary | ICD-10-CM | POA: Diagnosis not present

## 2015-04-22 DIAGNOSIS — R509 Fever, unspecified: Secondary | ICD-10-CM

## 2015-04-22 DIAGNOSIS — Z01818 Encounter for other preprocedural examination: Secondary | ICD-10-CM

## 2015-04-22 DIAGNOSIS — I10 Essential (primary) hypertension: Secondary | ICD-10-CM | POA: Diagnosis not present

## 2015-04-22 DIAGNOSIS — M79606 Pain in leg, unspecified: Secondary | ICD-10-CM

## 2015-04-22 DIAGNOSIS — R600 Localized edema: Secondary | ICD-10-CM | POA: Diagnosis present

## 2015-04-22 DIAGNOSIS — K900 Celiac disease: Secondary | ICD-10-CM | POA: Diagnosis not present

## 2015-04-22 DIAGNOSIS — D62 Acute posthemorrhagic anemia: Secondary | ICD-10-CM | POA: Diagnosis not present

## 2015-04-22 DIAGNOSIS — S72002K Fracture of unspecified part of neck of left femur, subsequent encounter for closed fracture with nonunion: Secondary | ICD-10-CM | POA: Diagnosis not present

## 2015-04-22 DIAGNOSIS — Z91012 Allergy to eggs: Secondary | ICD-10-CM

## 2015-04-22 DIAGNOSIS — Z888 Allergy status to other drugs, medicaments and biological substances status: Secondary | ICD-10-CM | POA: Diagnosis not present

## 2015-04-22 DIAGNOSIS — M629 Disorder of muscle, unspecified: Secondary | ICD-10-CM | POA: Diagnosis not present

## 2015-04-22 DIAGNOSIS — T84011A Broken internal left hip prosthesis, initial encounter: Principal | ICD-10-CM | POA: Diagnosis present

## 2015-04-22 DIAGNOSIS — Z96649 Presence of unspecified artificial hip joint: Secondary | ICD-10-CM

## 2015-04-22 DIAGNOSIS — N390 Urinary tract infection, site not specified: Secondary | ICD-10-CM | POA: Diagnosis not present

## 2015-04-22 DIAGNOSIS — K9041 Non-celiac gluten sensitivity: Secondary | ICD-10-CM | POA: Diagnosis present

## 2015-04-22 DIAGNOSIS — S7222XA Displaced subtrochanteric fracture of left femur, initial encounter for closed fracture: Secondary | ICD-10-CM | POA: Diagnosis present

## 2015-04-22 DIAGNOSIS — R262 Difficulty in walking, not elsewhere classified: Secondary | ICD-10-CM | POA: Diagnosis present

## 2015-04-22 DIAGNOSIS — E785 Hyperlipidemia, unspecified: Secondary | ICD-10-CM | POA: Diagnosis present

## 2015-04-22 DIAGNOSIS — N342 Other urethritis: Secondary | ICD-10-CM | POA: Diagnosis not present

## 2015-04-22 DIAGNOSIS — M25559 Pain in unspecified hip: Secondary | ICD-10-CM

## 2015-04-22 DIAGNOSIS — N3000 Acute cystitis without hematuria: Secondary | ICD-10-CM | POA: Diagnosis not present

## 2015-04-22 DIAGNOSIS — S72002S Fracture of unspecified part of neck of left femur, sequela: Secondary | ICD-10-CM | POA: Diagnosis not present

## 2015-04-22 DIAGNOSIS — S7222XS Displaced subtrochanteric fracture of left femur, sequela: Secondary | ICD-10-CM | POA: Diagnosis not present

## 2015-04-22 DIAGNOSIS — IMO0002 Reserved for concepts with insufficient information to code with codable children: Secondary | ICD-10-CM

## 2015-04-22 DIAGNOSIS — M25552 Pain in left hip: Secondary | ICD-10-CM | POA: Diagnosis present

## 2015-04-22 HISTORY — DX: Family history of other specified conditions: Z84.89

## 2015-04-22 HISTORY — DX: Other complications of anesthesia, initial encounter: T88.59XA

## 2015-04-22 HISTORY — DX: Adverse effect of unspecified anesthetic, initial encounter: T41.45XA

## 2015-04-22 HISTORY — DX: Hypothyroidism, unspecified: E03.9

## 2015-04-22 LAB — COMPREHENSIVE METABOLIC PANEL
ALBUMIN: 3.7 g/dL (ref 3.5–5.2)
ALK PHOS: 86 U/L (ref 39–117)
ALT: 21 U/L (ref 0–35)
ALT: 19 U/L (ref 0–35)
AST: 16 U/L (ref 0–37)
AST: 18 U/L (ref 0–37)
Albumin: 3.4 g/dL — ABNORMAL LOW (ref 3.5–5.2)
Alkaline Phosphatase: 90 U/L (ref 39–117)
Anion gap: 8 (ref 5–15)
Anion gap: 7 (ref 5–15)
BUN: 22 mg/dL (ref 6–23)
BUN: 18 mg/dL (ref 6–23)
CO2: 24 mmol/L (ref 19–32)
CO2: 26 mmol/L (ref 19–32)
Calcium: 9.2 mg/dL (ref 8.4–10.5)
Calcium: 9.1 mg/dL (ref 8.4–10.5)
Chloride: 102 mmol/L (ref 96–112)
Chloride: 104 mmol/L (ref 96–112)
Creatinine, Ser: 0.67 mg/dL (ref 0.50–1.10)
Creatinine, Ser: 0.64 mg/dL (ref 0.50–1.10)
GFR calc non Af Amer: >90 mL/min (ref >90)
GFR calc non Af Amer: >90 mL/min (ref >90)
GLUCOSE: 146 mg/dL — AB (ref 70–99)
Glucose, Bld: 147 mg/dL — ABNORMAL HIGH (ref 70–99)
POTASSIUM: 3.4 mmol/L — AB (ref 3.5–5.1)
Potassium: 3.6 mmol/L (ref 3.5–5.1)
SODIUM: 137 mmol/L (ref 135–145)
Sodium: 134 mmol/L — ABNORMAL LOW (ref 135–145)
TOTAL PROTEIN: 6.7 g/dL (ref 6.0–8.3)
TOTAL PROTEIN: 6.1 g/dL (ref 6.0–8.3)
Total Bilirubin: 0.5 mg/dL (ref 0.3–1.2)
Total Bilirubin: 0.6 mg/dL (ref 0.3–1.2)

## 2015-04-22 LAB — CBC WITH DIFFERENTIAL/PLATELET
BASOS ABS: 0 10*3/uL (ref 0.0–0.1)
BASOS PCT: 0 % (ref 0–1)
EOS ABS: 0.1 10*3/uL (ref 0.0–0.7)
EOS PCT: 1 % (ref 0–5)
HEMATOCRIT: 37.2 % (ref 36.0–46.0)
Hemoglobin: 12.3 g/dL (ref 12.0–15.0)
LYMPHS PCT: 18 % (ref 12–46)
Lymphs Abs: 1.8 10*3/uL (ref 0.7–4.0)
MCH: 27.6 pg (ref 26.0–34.0)
MCHC: 33.1 g/dL (ref 30.0–36.0)
MCV: 83.4 fL (ref 78.0–100.0)
MONO ABS: 0.8 10*3/uL (ref 0.1–1.0)
Monocytes Relative: 8 % (ref 3–12)
Neutro Abs: 7.5 10*3/uL (ref 1.7–7.7)
Neutrophils Relative %: 73 % (ref 43–77)
Platelets: 246 10*3/uL (ref 150–400)
RBC: 4.46 MIL/uL (ref 3.87–5.11)
RDW: 13.9 % (ref 11.5–15.5)
WBC: 10.2 10*3/uL (ref 4.0–10.5)

## 2015-04-22 LAB — URINALYSIS, ROUTINE W REFLEX MICROSCOPIC
Bilirubin Urine: NEGATIVE
GLUCOSE, UA: NEGATIVE mg/dL
Ketones, ur: NEGATIVE mg/dL
Leukocytes, UA: NEGATIVE
Nitrite: NEGATIVE
Protein, ur: NEGATIVE mg/dL
SPECIFIC GRAVITY, URINE: 1.034 — AB (ref 1.005–1.030)
Urobilinogen, UA: 0.2 mg/dL (ref 0.0–1.0)
pH: 5 (ref 5.0–8.0)

## 2015-04-22 LAB — URINE MICROSCOPIC-ADD ON

## 2015-04-22 LAB — ALBUMIN: Albumin: 3.6 g/dL (ref 3.5–5.2)

## 2015-04-22 LAB — CALCIUM: CALCIUM: 9.2 mg/dL (ref 8.4–10.5)

## 2015-04-22 MED ORDER — HYDROMORPHONE HCL 1 MG/ML IJ SOLN
0.5000 mg | Freq: Once | INTRAMUSCULAR | Status: AC
Start: 2015-04-22 — End: 2015-04-22
  Administered 2015-04-22: 0.5 mg via INTRAVENOUS
  Filled 2015-04-22: qty 1

## 2015-04-22 MED ORDER — HEPARIN SODIUM (PORCINE) 5000 UNIT/ML IJ SOLN
5000.0000 [IU] | Freq: Three times a day (TID) | INTRAMUSCULAR | Status: DC
  Administered 2015-04-22 – 2015-04-23 (×5): 5000 [IU] via SUBCUTANEOUS
  Filled 2015-04-22 (×7): qty 1

## 2015-04-22 MED ORDER — OXYCODONE-ACETAMINOPHEN 5-325 MG PO TABS
1.0000 | ORAL_TABLET | Freq: Once | ORAL | Status: AC
  Administered 2015-04-22: 1 via ORAL
  Filled 2015-04-22: qty 1

## 2015-04-22 MED ORDER — DOCUSATE SODIUM 100 MG PO CAPS
100.0000 mg | ORAL_CAPSULE | Freq: Two times a day (BID) | ORAL | Status: DC
  Administered 2015-04-25 – 2015-04-27 (×5): 100 mg via ORAL
  Filled 2015-04-22 (×12): qty 1

## 2015-04-22 MED ORDER — HYDROMORPHONE HCL 1 MG/ML IJ SOLN
1.0000 mg | INTRAMUSCULAR | Status: DC | PRN
Start: 2015-04-22 — End: 2015-04-28
  Administered 2015-04-22 – 2015-04-28 (×7): 1 mg via INTRAVENOUS
  Filled 2015-04-22 (×8): qty 1

## 2015-04-22 MED ORDER — HYDROMORPHONE HCL 1 MG/ML IJ SOLN: 1.0000 mg | INTRAMUSCULAR | Status: DC | PRN

## 2015-04-22 MED ORDER — IRBESARTAN 75 MG PO TABS
37.5000 mg | ORAL_TABLET | Freq: Every day | ORAL | Status: DC
  Administered 2015-04-22 – 2015-04-23 (×2): 37.5 mg via ORAL
  Filled 2015-04-22 (×4): qty 0.5

## 2015-04-22 MED ORDER — LEVOTHYROXINE SODIUM 88 MCG PO TABS
88.0000 ug | ORAL_TABLET | Freq: Every day | ORAL | Status: DC
  Administered 2015-04-23 – 2015-04-28 (×5): 88 ug via ORAL
  Filled 2015-04-22 (×8): qty 1

## 2015-04-22 MED ORDER — FERROUS SULFATE 325 (65 FE) MG PO TABS
325.0000 mg | ORAL_TABLET | Freq: Three times a day (TID) | ORAL | Status: DC
  Administered 2015-04-22 – 2015-04-28 (×16): 325 mg via ORAL
  Filled 2015-04-22 (×20): qty 1

## 2015-04-22 MED ORDER — VITAMIN D (ERGOCALCIFEROL) 1.25 MG (50000 UNIT) PO CAPS
50000.0000 [IU] | ORAL_CAPSULE | ORAL | Status: DC
  Administered 2015-04-22 – 2015-04-27 (×2): 50000 [IU] via ORAL
  Filled 2015-04-22 (×5): qty 1

## 2015-04-22 MED ORDER — KCL IN DEXTROSE-NACL 20-5-0.9 MEQ/L-%-% IV SOLN
INTRAVENOUS | Status: DC
  Administered 2015-04-22 – 2015-04-24 (×5): via INTRAVENOUS
  Filled 2015-04-22 (×8): qty 1000

## 2015-04-22 MED ORDER — HYDROCODONE-ACETAMINOPHEN 5-325 MG PO TABS
1.0000 | ORAL_TABLET | Freq: Four times a day (QID) | ORAL | Status: DC | PRN
  Administered 2015-04-23 (×4): 1 via ORAL
  Filled 2015-04-22 (×3): qty 1
  Filled 2015-04-22: qty 2

## 2015-04-22 NOTE — ED Notes (Signed)
Placed size 14 french foley cath into patient yellow urine in return

## 2015-04-22 NOTE — Progress Notes (Signed)
Pt admitted to the unit at 1810. Pt mental status is A&OX4. Pt oriented to room, staff, and call bell. Skin is intact but buttocks red but blanchable, old incisions to L hip. Full assessment charted in [facility]. Call bell within reach. Visitor guidelines reviewed w/ pt and/or family.

## 2015-04-22 NOTE — Progress Notes (Signed)
After speaking with the OR and determining their schedule plan for removal of IM nail and conversion to left total hip arthroplasty this Friday 04/24/15 by Dr. Alvan Dame. NPO at midnight 4/28 Strict non weight bearing Pain management as needed She may eat today as tolerated

## 2015-04-22 NOTE — ED Notes (Signed)
Pt provided ginger ale, as surgery wont be until Friday. Meal tray ordered for pt.

## 2015-04-22 NOTE — Consult Note (Signed)
Reason for Consult Left hip pain and failed IM Nail after hip fracture Referring Physician: Chariti Havel is an 64 y.o. female.  HPI: 64 yo female who is 4 months s/p acute left comminuted peritrochanteric femur fracture treated with IM Nailing. She reports a several week history of increasing pain in the hip and increasingly difficulty ambulating. Currently admitted after eval in the ED revealed a hardware failure(nail breakage) and nonunion of the left peritroch fracture.  Past Medical History  Diagnosis Date  . 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 Chickenpox   . Genital warts   . Environmental allergies   . Heart murmur   . HTN (hypertension)   . Hyperlipidemia   . Thyroid disease     Hypothyroid  . Thyroid nodule     Past Surgical History  Procedure Laterality Date  . Carpal tunnel release      Both wrist  . Cystectomy      From the cervix  . Intramedullary (im) nail intertrochanteric Left 12/15/2014    Procedure: INTRAMEDULLARY (IM) NAIL INTERTROCHANTRIC;  Surgeon: Augustin Schooling, MD;  Location: [facility];  Service: Orthopedics;  Laterality: Left;    Family History  Problem Relation Age of Onset  . Arthritis Mother   . Hyperlipidemia Mother   . Heart disease Father   . Stroke Father     x's 2  . High blood pressure Father   . High blood pressure Mother   . Diabetes Mother   . Myasthenia gravis Mother     Social History:  reports that she has never smoked. She has never used smokeless tobacco. She reports that she drinks alcohol. She reports that she does not use illicit drugs.  Allergies:  Allergies  Allergen Reactions  . Gluten Meal   . Statins Diarrhea  . Eggs Or Egg-Derived Products Diarrhea and Rash    Medications: I have reviewed the patient's current medications.  Results for orders placed or performed during the hospital encounter of 04/22/15 (from the past 48 hour(s))  Comprehensive metabolic panel     Status: Abnormal    Collection Time: 04/22/15  7:09 AM  Result Value Ref Range   Sodium 134 (L) 135 - 145 mmol/L   Potassium 3.6 3.5 - 5.1 mmol/L   Chloride 102 96 - 112 mmol/L   CO2 24 19 - 32 mmol/L   Glucose, Bld 147 (H) 70 - 99 mg/dL   BUN 22 6 - 23 mg/dL   Creatinine, Ser 0.67 0.50 - 1.10 mg/dL   Calcium 9.2 8.4 - 10.5 mg/dL   Total Protein 6.7 6.0 - 8.3 g/dL   Albumin 3.7 3.5 - 5.2 g/dL   AST 16 0 - 37 U/L   ALT 21 0 - 35 U/L   Alkaline Phosphatase 90 39 - 117 U/L   Total Bilirubin 0.5 0.3 - 1.2 mg/dL   GFR calc non Af Amer >90 >90 mL/min   GFR calc Af Amer >90 >90 mL/min    Comment: (NOTE) The eGFR has been calculated using the CKD EPI equation. This calculation has not been validated in all clinical situations. eGFR's persistently <90 mL/min signify possible Chronic Kidney Disease.    Anion gap 8 5 - 15  CBC with Differential     Status: None   Collection Time: 04/22/15  7:09 AM  Result Value Ref Range   WBC 10.2 4.0 - 10.5 K/uL   RBC 4.46 3.87 - 5.11 MIL/uL  Hemoglobin 12.3 12.0 - 15.0 g/dL   HCT 37.2 36.0 - 46.0 %   MCV 83.4 78.0 - 100.0 fL   MCH 27.6 26.0 - 34.0 pg   MCHC 33.1 30.0 - 36.0 g/dL   RDW 13.9 11.5 - 15.5 %   Platelets 246 150 - 400 K/uL   Neutrophils Relative % 73 43 - 77 %   Neutro Abs 7.5 1.7 - 7.7 K/uL   Lymphocytes Relative 18 12 - 46 %   Lymphs Abs 1.8 0.7 - 4.0 K/uL   Monocytes Relative 8 3 - 12 %   Monocytes Absolute 0.8 0.1 - 1.0 K/uL   Eosinophils Relative 1 0 - 5 %   Eosinophils Absolute 0.1 0.0 - 0.7 K/uL   Basophils Relative 0 0 - 1 %   Basophils Absolute 0.0 0.0 - 0.1 K/uL  Urinalysis, Routine w reflex microscopic     Status: Abnormal   Collection Time: 04/22/15  8:20 AM  Result Value Ref Range   Color, Urine AMBER (A) YELLOW    Comment: BIOCHEMICALS MAY BE AFFECTED BY COLOR   APPearance CLOUDY (A) CLEAR   Specific Gravity, Urine 1.034 (H) 1.005 - 1.030   pH 5.0 5.0 - 8.0   Glucose, UA NEGATIVE NEGATIVE mg/dL   Hgb urine dipstick SMALL (A)  NEGATIVE   Bilirubin Urine NEGATIVE NEGATIVE   Ketones, ur NEGATIVE NEGATIVE mg/dL   Protein, ur NEGATIVE NEGATIVE mg/dL   Urobilinogen, UA 0.2 0.0 - 1.0 mg/dL   Nitrite NEGATIVE NEGATIVE   Leukocytes, UA NEGATIVE NEGATIVE  Urine microscopic-add on     Status: Abnormal   Collection Time: 04/22/15  8:20 AM  Result Value Ref Range   Squamous Epithelial / LPF RARE RARE   WBC, UA 0-2 <3 WBC/hpf   RBC / HPF 3-6 <3 RBC/hpf   Bacteria, UA FEW (A) RARE   Casts RED CELL CAST (A) NEGATIVE  Comprehensive metabolic panel     Status: Abnormal   Collection Time: 04/22/15 12:19 PM  Result Value Ref Range   Sodium 137 135 - 145 mmol/L   Potassium 3.4 (L) 3.5 - 5.1 mmol/L   Chloride 104 96 - 112 mmol/L   CO2 26 19 - 32 mmol/L   Glucose, Bld 146 (H) 70 - 99 mg/dL   BUN 18 6 - 23 mg/dL   Creatinine, Ser 0.64 0.50 - 1.10 mg/dL   Calcium 9.1 8.4 - 10.5 mg/dL   Total Protein 6.1 6.0 - 8.3 g/dL   Albumin 3.4 (L) 3.5 - 5.2 g/dL   AST 18 0 - 37 U/L   ALT 19 0 - 35 U/L   Alkaline Phosphatase 86 39 - 117 U/L   Total Bilirubin 0.6 0.3 - 1.2 mg/dL   GFR calc non Af Amer >90 >90 mL/min   GFR calc Af Amer >90 >90 mL/min    Comment: (NOTE) The eGFR has been calculated using the CKD EPI equation. This calculation has not been validated in all clinical situations. eGFR's persistently <90 mL/min signify possible Chronic Kidney Disease.    Anion gap 7 5 - 15    Dg Chest 1 View  04/22/2015   CLINICAL DATA:  Progressive left leg pain. Failure of the proximal aspect of left femoral intramedullary rod.  EXAM: CHEST  1 VIEW  COMPARISON:  12/15/2014  FINDINGS: Speckled density in the right proximal humerus measuring 9.2 cm in length probably representing enchondroma.  Atherosclerotic aortic arch. Heart size within normal limits. The lungs appear clear.  IMPRESSION: 1. No active cardiopulmonary disease is radiographically apparent. 2. Large right proximal humeral enchondroma. In the absence of symptoms, this does  not require further workup.   Electronically Signed   By: Van Clines M.D.   On: 04/22/2015 08:58   Dg Pelvis 1-2 Views  04/22/2015   CLINICAL DATA:  Acute onset of left leg pain.  Initial encounter.  EXAM: PELVIS - 1-2 VIEW  COMPARISON:  None.  FINDINGS: There is a fracture through the proximal aspect of the intramedullary rod, at its intersection with the hip screws, with angulation of the proximal portion of the intramedullary rod. Underlying known intertrochanteric fracture fragments are difficult to fully assess. The left femoral head remains seated at the acetabulum. The right hip joint is unremarkable in appearance.  Mild degenerative change is noted at the lower lumbar spine. The visualized bowel gas pattern is grossly unremarkable.  IMPRESSION: Failure of the proximal aspect of the left femoral intramedullary rod, with a fracture through the rod at its intersection with the hip screws, and angulation of the proximal portion of the intramedullary rod. Underlying intertrochanteric fracture fragment alignment not well assessed.   Electronically Signed   By: Garald Balding M.D.   On: 04/22/2015 06:34   Ct Lumbar Spine Wo Contrast  04/22/2015   CLINICAL DATA:  Low back and left hip pain. Lumbar compression fracture. Initial encounter.  EXAM: CT LUMBAR SPINE WITHOUT CONTRAST  TECHNIQUE: Multidetector CT imaging of the lumbar spine was performed without intravenous contrast administration. Multiplanar CT image reconstructions were also generated.  COMPARISON:  None.  FINDINGS: The patient has a compression fracture of L4 with vertebral body height loss of up to 95%. There is bony retropulsion off the superior endplate as described below. The fracture is of indeterminate chronicity. No extension into the posterior elements is identified. No other fracture is seen. Vertebral body alignment is maintained. Imaged paraspinous structures demonstrate scattered aortic atherosclerosis without aneurysm.   T11-12: The disc is calcified. No bulge or protrusion. The central canal and foramina appear open.  T12-L1: The disc is calcified. No bulge or protrusion. The central canal and foramina appear open.  L1-2:  Negative.  L2-3: There is some facet degenerative disease. No disc bulge or protrusion. The central canal and foramina are open.  L3-4: Bony retropulsion off the superior endplate of L4 results in moderately severe central canal stenosis. Moderate bilateral foraminal narrowing is also seen.  L4-5: Advanced facet degenerative disease is seen. There is a shallow disc bulge and some ligamentum flavum thickening. Moderate to moderately severe central canal stenosis is present. Left worse than right foraminal narrowing is identified.  L5-S1: Shallow disc bulge and endplate spur without central canal narrowing. Moderately severe to severe foraminal narrowing appears worse on the left.  IMPRESSION: Age-indeterminate L4 compression fracture with up to 95 % vertebral body height loss. Bony retropulsion off the superior endplate of L4 results in moderately severe central canal stenosis at L3-4.  Moderate to moderately severe central canal stenosis L4-5 where there is left worse than right foraminal narrowing.  Moderately severe to severe bilateral foraminal narrowing L5-S1 is worse on the left.   Electronically Signed   By: Inge Rise M.D.   On: 04/22/2015 09:13   Dg Femur Min 2 Views Left  04/22/2015   CLINICAL DATA:  Acute onset of left leg pain. Unable to bear weight. Initial encounter.  EXAM: LEFT FEMUR 2 VIEWS  COMPARISON:  None.  FINDINGS: There appears to be a fracture  through the proximal aspect of the patient's intramedullary rod, at the level at which it intersects with the hip screws. The proximal intramedullary rod is abnormally angulated. There is some degree of displacement of underlying intertrochanteric fracture fragments, difficult to fully assess.  The left femoral head remains seated at the  acetabulum. No significant soft tissue abnormalities are characterized on radiograph. The knee joint is grossly unremarkable, with underlying mild osteoarthritis.  IMPRESSION: Failure of the proximal aspect of the patient's intramedullary rod, with a fracture through the rod, at the level at which it intersects with the hip screws. The proximal intramedullary rod is abnormally angulated. Some degree of displacement of underlying intertrochanteric fracture fragments, difficult to fully assess.   Electronically Signed   By: Garald Balding M.D.   On: 04/22/2015 06:27    ROS Blood pressure 143/80, pulse 93, temperature 98 F (36.7 C), temperature source Oral, resp. rate 18, SpO2 99 %. Physical Exam Patient reports improved pain since being in the ED. She is reclining on a stretcher L LE NVI decreased ROM of the left leg and hip due to pain.  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 Admit for planned revision surgery to a total hip arthroplasty.  This will be expected to dramatically improve her pain, mobility and allow her to recover.  I have discussed this case with Dr Paralee Cancel, hip replacement specialist at [facility], who agreed to take over her care and who will be performing her surgery on Friday morning. DVT prophylaxis with mechanical and Lovenox until surgery.  Lorraine Terriquez,STEVEN R 04/22/2015, 2:01 PM

## 2015-04-22 NOTE — Consult Note (Signed)
[name] is an 64 y.o. female.    Chief Complaint: left hip pain  HPI: 64 y/o female with worsening left hip pain over the past several weeks. Patient had a severe peritroch fracture treated with IM nail several months ago. Pt seen in the office yesterday with x-rays taken of both the left hip and back and CT scans ordered to assess for other abnormality. Her pain continued to be severe so presented to the emergency room. Plain x-rays today demonstrate a failed IM nail with probable non union of left femur fracture. Pt to be admitted by medicine team and plan for revision of the left femur and possible total hip arthroplasty to be performed depending on OR schedule.  PCP:  Jerlyn Ly, MD  PMH: Past Medical History  Diagnosis Date  . 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 Chickenpox   . Genital warts   . Environmental allergies   . Heart murmur   . HTN (hypertension)   . Hyperlipidemia   . Thyroid disease     Hypothyroid  . Thyroid nodule     PSH: Past Surgical History  Procedure Laterality Date  . Carpal tunnel release      Both wrist  . Cystectomy      From the cervix  . Intramedullary (im) nail intertrochanteric Left 12/15/2014    Procedure: INTRAMEDULLARY (IM) NAIL INTERTROCHANTRIC;  Surgeon: Augustin Schooling, MD;  Location: [facility];  Service: Orthopedics;  Laterality: Left;    Social History:  reports that she has never smoked. She has never used smokeless tobacco. She reports that she drinks alcohol. She reports that she does not use illicit drugs.  Allergies:  Allergies  Allergen Reactions  . Statins Diarrhea  . Eggs Or Egg-Derived Products Diarrhea and Rash    Medications: Current Facility-Administered Medications  Medication Dose Route Frequency Provider Last Rate Last Dose  . dextrose 5 % and 0.9 % NaCl with KCl 20 mEq/L infusion   Intravenous Continuous Samella Parr, NP      . HYDROmorphone (DILAUDID) injection 1 mg  1 mg Intravenous  Q2H PRN Samella Parr, NP       Current Outpatient Prescriptions  Medication Sig Dispense Refill  . azelastine (ASTELIN) 0.1 % nasal spray Place 1 spray into both nostrils 2 (two) times daily. Use in each nostril as directed 90 mL 3  . Cyanocobalamin (VITAMIN B-12 PO) Take 1 tablet by mouth daily.    . fluticasone (FLONASE) 50 MCG/ACT nasal spray Place 2 sprays into both nostrils daily. 48 g 3  . HYDROcodone-acetaminophen (NORCO/VICODIN) 5-325 MG per tablet Take 1-2 tablets by mouth every 6 (six) hours as needed for moderate pain. 90 tablet 0  . levothyroxine (SYNTHROID) 88 MCG tablet Take 1 tablet (88 mcg total) by mouth daily. 90 tablet 3  . valsartan (DIOVAN) 320 MG tablet TAKE 1 TABLET DAILY 90 tablet 3  . Vitamin D, Ergocalciferol, (DRISDOL) 50000 UNITS CAPS capsule Take 50,000 Units by mouth 3 (three) times a week.    Marland Kitchen acetaminophen (TYLENOL) 325 MG tablet Take 2 tablets (650 mg total) by mouth every 6 (six) hours as needed. (Patient not taking: Reported on 04/22/2015) 60 tablet 1  . methocarbamol (ROBAXIN) 750 MG tablet Take 1 tablet (750 mg total) by mouth every 6 (six) hours as needed for muscle spasms. (Patient not taking: Reported on 04/22/2015) 90 tablet 0    Results for orders placed or performed during the hospital  encounter of 04/22/15 (from the past 48 hour(s))  Comprehensive metabolic panel     Status: Abnormal   Collection Time: 04/22/15  7:09 AM  Result Value Ref Range   Sodium 134 (L) 135 - 145 mmol/L   Potassium 3.6 3.5 - 5.1 mmol/L   Chloride 102 96 - 112 mmol/L   CO2 24 19 - 32 mmol/L   Glucose, Bld 147 (H) 70 - 99 mg/dL   BUN 22 6 - 23 mg/dL   Creatinine, Ser 0.67 0.50 - 1.10 mg/dL   Calcium 9.2 8.4 - 10.5 mg/dL   Total Protein 6.7 6.0 - 8.3 g/dL   Albumin 3.7 3.5 - 5.2 g/dL   AST 16 0 - 37 U/L   ALT 21 0 - 35 U/L   Alkaline Phosphatase 90 39 - 117 U/L   Total Bilirubin 0.5 0.3 - 1.2 mg/dL   GFR calc non Af Amer >90 >90 mL/min   GFR calc Af Amer >90 >90  mL/min    Comment: (NOTE) The eGFR has been calculated using the CKD EPI equation. This calculation has not been validated in all clinical situations. eGFR's persistently <90 mL/min signify possible Chronic Kidney Disease.    Anion gap 8 5 - 15  CBC with Differential     Status: None   Collection Time: 04/22/15  7:09 AM  Result Value Ref Range   WBC 10.2 4.0 - 10.5 K/uL   RBC 4.46 3.87 - 5.11 MIL/uL   Hemoglobin 12.3 12.0 - 15.0 g/dL   HCT 37.2 36.0 - 46.0 %   MCV 83.4 78.0 - 100.0 fL   MCH 27.6 26.0 - 34.0 pg   MCHC 33.1 30.0 - 36.0 g/dL   RDW 13.9 11.5 - 15.5 %   Platelets 246 150 - 400 K/uL   Neutrophils Relative % 73 43 - 77 %   Neutro Abs 7.5 1.7 - 7.7 K/uL   Lymphocytes Relative 18 12 - 46 %   Lymphs Abs 1.8 0.7 - 4.0 K/uL   Monocytes Relative 8 3 - 12 %   Monocytes Absolute 0.8 0.1 - 1.0 K/uL   Eosinophils Relative 1 0 - 5 %   Eosinophils Absolute 0.1 0.0 - 0.7 K/uL   Basophils Relative 0 0 - 1 %   Basophils Absolute 0.0 0.0 - 0.1 K/uL  Urinalysis, Routine w reflex microscopic     Status: Abnormal   Collection Time: 04/22/15  8:20 AM  Result Value Ref Range   Color, Urine AMBER (A) YELLOW    Comment: BIOCHEMICALS MAY BE AFFECTED BY COLOR   APPearance CLOUDY (A) CLEAR   Specific Gravity, Urine 1.034 (H) 1.005 - 1.030   pH 5.0 5.0 - 8.0   Glucose, UA NEGATIVE NEGATIVE mg/dL   Hgb urine dipstick SMALL (A) NEGATIVE   Bilirubin Urine NEGATIVE NEGATIVE   Ketones, ur NEGATIVE NEGATIVE mg/dL   Protein, ur NEGATIVE NEGATIVE mg/dL   Urobilinogen, UA 0.2 0.0 - 1.0 mg/dL   Nitrite NEGATIVE NEGATIVE   Leukocytes, UA NEGATIVE NEGATIVE  Urine microscopic-add on     Status: Abnormal   Collection Time: 04/22/15  8:20 AM  Result Value Ref Range   Squamous Epithelial / LPF RARE RARE   WBC, UA 0-2 <3 WBC/hpf   RBC / HPF 3-6 <3 RBC/hpf   Bacteria, UA FEW (A) RARE   Casts RED CELL CAST (A) NEGATIVE   Dg Pelvis 1-2 Views  04/22/2015   CLINICAL DATA:  Acute onset of left leg  pain.  Initial encounter.  EXAM: PELVIS - 1-2 VIEW  COMPARISON:  None.  FINDINGS: There is a fracture through the proximal aspect of the intramedullary rod, at its intersection with the hip screws, with angulation of the proximal portion of the intramedullary rod. Underlying known intertrochanteric fracture fragments are difficult to fully assess. The left femoral head remains seated at the acetabulum. The right hip joint is unremarkable in appearance.  Mild degenerative change is noted at the lower lumbar spine. The visualized bowel gas pattern is grossly unremarkable.  IMPRESSION: Failure of the proximal aspect of the left femoral intramedullary rod, with a fracture through the rod at its intersection with the hip screws, and angulation of the proximal portion of the intramedullary rod. Underlying intertrochanteric fracture fragment alignment not well assessed.   Electronically Signed   By: Garald Balding M.D.   On: 04/22/2015 06:34   Dg Femur Min 2 Views Left  04/22/2015   CLINICAL DATA:  Acute onset of left leg pain. Unable to bear weight. Initial encounter.  EXAM: LEFT FEMUR 2 VIEWS  COMPARISON:  None.  FINDINGS: There appears to be a fracture through the proximal aspect of the patient's intramedullary rod, at the level at which it intersects with the hip screws. The proximal intramedullary rod is abnormally angulated. There is some degree of displacement of underlying intertrochanteric fracture fragments, difficult to fully assess.  The left femoral head remains seated at the acetabulum. No significant soft tissue abnormalities are characterized on radiograph. The knee joint is grossly unremarkable, with underlying mild osteoarthritis.  IMPRESSION: Failure of the proximal aspect of the patient's intramedullary rod, with a fracture through the rod, at the level at which it intersects with the hip screws. The proximal intramedullary rod is abnormally angulated. Some degree of displacement of underlying  intertrochanteric fracture fragments, difficult to fully assess.   Electronically Signed   By: Garald Balding M.D.   On: 04/22/2015 06:27    ROS: ROS Pain with weight bearing in left hip and left lower extremity Denies any numbness or tingling distally  Physical Exam: Lumbar spine with no tenderness and decent rom Left hip with moderate pain with any rom Irritable hip nv intact distally Pt currently unable to bear weight on left lower extremity No rashes or edema Physical Exam   Assessment/Plan Assessment: failed left hip IM nail with probable non union left femur  Plan: Medical team to admit Plan to revise the left femur IM nail and possible complete total hip arthroplasty depending on OR schedule Keep NPO Pain management Bedrest  Spoke to the husband to inform him of the plans

## 2015-04-22 NOTE — ED Notes (Signed)
Patient transported to CT 

## 2015-04-22 NOTE — ED Notes (Signed)
Patient transported to X-ray 

## 2015-04-22 NOTE — Telephone Encounter (Signed)
spouse called, wife in MD office in extreme pain, MD wants all PT cancelled, possible injuries

## 2015-04-22 NOTE — ED Notes (Signed)
Patient placed on 2L nasal cannula, desaturated down to 91% on room air after pain medication and sleeping.  Currently at 97% on 2L.

## 2015-04-22 NOTE — H&P (Signed)
[facility] History and Physical                                                                                    [name], is a 64 y.o. female  MRN: [id number]   DOB - 1951/06/22  Admit Date - 04/22/2015  Outpatient Primary MD for the patient is Jerlyn Ly, MD  With History of -  Past Medical History  Diagnosis Date  . 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 Chickenpox   . Genital warts   . Environmental allergies   . Heart murmur   . HTN (hypertension)   . Hyperlipidemia   . Thyroid disease     Hypothyroid  . Thyroid nodule       Past Surgical History  Procedure Laterality Date  . Carpal tunnel release      Both wrist  . Cystectomy      From the cervix  . Intramedullary (im) nail intertrochanteric Left 12/15/2014    Procedure: INTRAMEDULLARY (IM) NAIL INTERTROCHANTRIC;  Surgeon: Augustin Schooling, MD;  Location: [facility];  Service: Orthopedics;  Laterality: Left;    in for   Chief Complaint  Patient presents with  . Leg Pain     HPI This is a 64 year old patient referred to the hospitalist service for admission by Dr. Colin Rhein in the ER. 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 According to the patient she initially had a surgical procedure to repair a left hip fracture on 12/15/14 by Dr. Veverly Fells. Her rehabilitative course had been unremarkable and up until recently she had been able to ambulate without difficulty with a cane. Beginning this past Sunday she noticed increasing pain in the left hip and leg and eventually saw her primary orthopedic physician this past Tuesday. She had not fallen. Plain x-rays were obtained which were inconclusive and an outpatient CT of the leg was planned. Unfortunately last night she began having severe pain and was unable to bear weight so presented to the ER where x-ray findings were noted as  above. Her vital signs were stable she was afebrile. CBC was normal. An electrolyte panel had yet to be obtained. A CT of the leg was artery planned by the ER physician. Dr. Alvan Dame with orthopedics have been consulted and requested hospitalist service for admission.  Review of Systems   In addition to the HPI above,  No Fever-chills, myalgias or other constitutional symptoms No Headache, changes with Vision or hearing, new weakness, tingling, numbness in any extremity, No problems swallowing food or Liquids, indigestion/reflux No Chest pain, Cough or Shortness of Breath, palpitations, orthopnea or DOE No Abdominal pain, N/V; no melena or hematochezia, no dark tarry stools, Bowel movements are regular, No dysuria, hematuria or flank pain No new skin rashes, lesions, masses or bruises, No recent weight gain or loss Patient reports 2-3 days of left lower extremity swelling primarily in the ankle and foot region. Patient reports left lower leg has been shorter since surgery last December No polyuria,  polydypsia or polyphagia,  *A full 10 point Review of Systems was done, except as stated above, all other Review of Systems were negative.  Social History History  Substance Use Topics  . Smoking status: Never Smoker   . Smokeless tobacco: Never Used  . Alcohol Use: Yes     Comment: Occ    Family History Family History  Problem Relation Age of Onset  . Arthritis Mother   . Hyperlipidemia Mother   . Heart disease Father   . Stroke Father     x's 2  . High blood pressure Father   . High blood pressure Mother   . Diabetes Mother   . Myasthenia gravis Mother     Prior to Admission medications   Medication Sig Start Date End Date Taking? Authorizing Provider  azelastine (ASTELIN) 0.1 % nasal spray Place 1 spray into both nostrils 2 (two) times daily. Use in each nostril as directed 08/04/14  Yes Alferd Apa Lowne, DO  Cyanocobalamin (VITAMIN B-12 PO) Take 1 tablet by mouth daily.   Yes  Historical Provider, MD  fluticasone (FLONASE) 50 MCG/ACT nasal spray Place 2 sprays into both nostrils daily. 08/04/14  Yes Rosalita Chessman, DO  HYDROcodone-acetaminophen (NORCO/VICODIN) 5-325 MG per tablet Take 1-2 tablets by mouth every 6 (six) hours as needed for moderate pain. 12/31/14  Yes Daniel J Angiulli, PA-C  levothyroxine (SYNTHROID) 88 MCG tablet Take 1 tablet (88 mcg total) by mouth daily. 12/31/14  Yes Daniel J Angiulli, PA-C  valsartan (DIOVAN) 320 MG tablet TAKE 1 TABLET DAILY 08/04/14  Yes Rosalita Chessman, DO  Vitamin D, Ergocalciferol, (DRISDOL) 50000 UNITS CAPS capsule Take 50,000 Units by mouth 3 (three) times a week.   Yes Historical Provider, MD  acetaminophen (TYLENOL) 325 MG tablet Take 2 tablets (650 mg total) by mouth every 6 (six) hours as needed. Patient not taking: Reported on 04/22/2015 12/16/14   Netta Cedars, MD  methocarbamol (ROBAXIN) 750 MG tablet Take 1 tablet (750 mg total) by mouth every 6 (six) hours as needed for muscle spasms. Patient not taking: Reported on 04/22/2015 12/31/14   Lavon Paganini Angiulli, PA-C    Allergies  Allergen Reactions  . Statins Diarrhea  . Eggs Or Egg-Derived Products Diarrhea and Rash    Physical Exam  Vitals  Blood pressure 143/70, pulse 93, temperature 98 F (36.7 C), temperature source Oral, resp. rate 20, SpO2 95 %.   General:  In no acute distress, appears healthy and well nourished  Psych:  Normal affect, Denies Suicidal or Homicidal ideations, Awake Alert, Oriented X 3. Speech and thought patterns are clear and appropriate, no apparent short term memory deficits  Neuro:   No focal neurological deficits, CN II through XII intact, Strength 5/5 all 4 extremities, Sensation intact all 4 extremities.  ENT:  Ears and Eyes appear Normal, Conjunctivae clear, PER. Moist oral mucosa without erythema or exudates.  Neck:  Supple, No lymphadenopathy appreciated  Respiratory:  Symmetrical chest wall movement, Good air movement  bilaterally, CTAB. Room Air  Cardiac:  RRR, No Murmurs, soft focal left LE edema noted on a daily from knee to the ankle and somewhat involving the foot and is nonpitting, no JVD, No carotid bruits, peripheral pulses palpable at 2+  Abdomen:  Positive bowel sounds, Soft, Non tender, Non distended,  No masses appreciated, no obvious hepatosplenomegaly  Skin:  No Cyanosis, Normal Skin Turgor, No Skin Rash or Bruise.  Extremities: Asymmetrical with left lower extremity visibly shorter than the right  lower extremity, left lower extremity is not abnormally rotated  Data Review  CBC  Recent Labs Lab 04/22/15 0709  WBC 10.2  HGB 12.3  HCT 37.2  PLT 246  MCV 83.4  MCH 27.6  MCHC 33.1  RDW 13.9  LYMPHSABS 1.8  MONOABS 0.8  EOSABS 0.1  BASOSABS 0.0    Chemistries  No results for input(s): NA, K, CL, CO2, GLUCOSE, BUN, CREATININE, CALCIUM, MG, AST, ALT, ALKPHOS, BILITOT in the last 168 hours.  Invalid input(s): GFRCGP  CrCl cannot be calculated (Unknown ideal weight.).  No results for input(s): TSH, T4TOTAL, T3FREE, THYROIDAB in the last 72 hours.  Invalid input(s): FREET3  Coagulation profile No results for input(s): INR, PROTIME in the last 168 hours.  No results for input(s): DDIMER in the last 72 hours.  Cardiac Enzymes No results for input(s): CKMB, TROPONINI, MYOGLOBIN in the last 168 hours.  Invalid input(s): CK  Invalid input(s): POCBNP  Urinalysis    Component Value Date/Time   COLORURINE YELLOW 12/30/2014 2104   APPEARANCEUR CLEAR 12/30/2014 2104   LABSPEC 1.027 12/30/2014 2104   PHURINE 5.5 12/30/2014 2104   GLUCOSEU NEGATIVE 12/30/2014 2104   HGBUR NEGATIVE 12/30/2014 2104   BILIRUBINUR NEGATIVE 12/30/2014 2104   BILIRUBINUR Neg 08/04/2014 1415   KETONESUR NEGATIVE 12/30/2014 2104   PROTEINUR NEGATIVE 12/30/2014 2104   PROTEINUR Neg 08/04/2014 1415   UROBILINOGEN 0.2 12/30/2014 2104   UROBILINOGEN 0.2 08/04/2014 1415   NITRITE NEGATIVE 12/30/2014  2104   NITRITE Neg 08/04/2014 1415   LEUKOCYTESUR SMALL* 12/30/2014 2104    Imaging results:   Dg Pelvis 1-2 Views  04/22/2015   CLINICAL DATA:  Acute onset of left leg pain.  Initial encounter.  EXAM: PELVIS - 1-2 VIEW  COMPARISON:  None.  FINDINGS: There is a fracture through the proximal aspect of the intramedullary rod, at its intersection with the hip screws, with angulation of the proximal portion of the intramedullary rod. Underlying known intertrochanteric fracture fragments are difficult to fully assess. The left femoral head remains seated at the acetabulum. The right hip joint is unremarkable in appearance.  Mild degenerative change is noted at the lower lumbar spine. The visualized bowel gas pattern is grossly unremarkable.  IMPRESSION: Failure of the proximal aspect of the left femoral intramedullary rod, with a fracture through the rod at its intersection with the hip screws, and angulation of the proximal portion of the intramedullary rod. Underlying intertrochanteric fracture fragment alignment not well assessed.   Electronically Signed   By: Garald Balding M.D.   On: 04/22/2015 06:34   Dg Femur Min 2 Views Left  04/22/2015   CLINICAL DATA:  Acute onset of left leg pain. Unable to bear weight. Initial encounter.  EXAM: LEFT FEMUR 2 VIEWS  COMPARISON:  None.  FINDINGS: There appears to be a fracture through the proximal aspect of the patient's intramedullary rod, at the level at which it intersects with the hip screws. The proximal intramedullary rod is abnormally angulated. There is some degree of displacement of underlying intertrochanteric fracture fragments, difficult to fully assess.  The left femoral head remains seated at the acetabulum. No significant soft tissue abnormalities are characterized on radiograph. The knee joint is grossly unremarkable, with underlying mild osteoarthritis.  IMPRESSION: Failure of the proximal aspect of the patient's intramedullary rod, with a fracture  through the rod, at the level at which it intersects with the hip screws. The proximal intramedullary rod is abnormally angulated. Some degree of displacement of underlying intertrochanteric  fracture fragments, difficult to fully assess.   Electronically Signed   By: Garald Balding M.D.   On: 04/22/2015 06:27     EKG: Sinus rhythm without any acute ischemic changes    Assessment & Plan  Active Problems:   Closed hip fracture -Admit to orthopedic floor -Dr. Veverly Fells with orthopedics to see patient later today and make determination regarding timing of surgical intervention -Follow up on CT left lower extremity -Provide appropriate narcotic analgesia -Nothing by mouth; provide IV fluids  -ck routine preoperative EKG and chest x-ray  -Follow up on pre admission CMET, calcium and vitamin D levels -Insert Foley catheter    HTN  -Therapeutic substitution of preadmission Diovan with Cozaar -Current blood pressure modestly controlled noting slight elevations related to hip pain    Hypothyroidism -Continue Synthroid    Leg edema, left -Likely related to current process of failure of surgical rod in left leg -As precaution we'll check lower extremity venous duplex to rule out DVT    DVT Prophylaxis: Subcutaneous heparin  Family Communication:   Husband at bedside  Code Status:  Full code  Condition:  Stable  Time spent in minutes : 60   ELLIS,ALLISON L. ANP on 04/22/2015 at 8:05 AM  Between 7am to 7pm - Pager - [phone]  After 7pm go to [email or web address] - password [email or web address]  And look for the night coverage person covering me after hours  [facility]

## 2015-04-22 NOTE — ED Provider Notes (Signed)
CSN: [id number]     Arrival date & time 04/22/15  0248 History   First MD Initiated Contact with Patient 04/22/15 0455     Chief Complaint  Patient presents with  . Leg Pain     (Consider location/radiation/quality/duration/timing/severity/associated sxs/prior Treatment) Patient is a 64 y.o. female presenting with leg pain.  Leg Pain Location:  Hip and leg Time since incident:  4 months Injury: yes   Mechanism of injury comment:  Femur fx 4 months ago sp repair Hip location:  L hip Leg location:  L leg and L upper leg Pain details:    Quality:  Aching and sharp   Radiates to:  Does not radiate   Severity:  Severe   Onset quality:  Gradual   Duration:  3 days   Timing:  Constant   Progression:  Worsening Chronicity:  New Dislocation: no   Foreign body present: rods. Prior injury to area:  Yes Worsened by:  Bearing weight, abduction, flexion, rotation, adduction and extension Ineffective treatments:  None tried Associated symptoms: back pain and decreased ROM   Associated symptoms: no numbness, no swelling and no tingling     Past Medical History  Diagnosis Date  . 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 Chickenpox   . Genital warts   . Environmental allergies   . Heart murmur   . HTN (hypertension)   . Hyperlipidemia   . Thyroid disease     Hypothyroid  . Thyroid nodule    Past Surgical History  Procedure Laterality Date  . Carpal tunnel release      Both wrist  . Cystectomy      From the cervix  . Intramedullary (im) nail intertrochanteric Left 12/15/2014    Procedure: INTRAMEDULLARY (IM) NAIL INTERTROCHANTRIC;  Surgeon: Augustin Schooling, MD;  Location: [facility];  Service: Orthopedics;  Laterality: Left;   Family History  Problem Relation Age of Onset  . Arthritis Mother   . Hyperlipidemia Mother   . Heart disease Father   . Stroke Father     x's 2  . High blood pressure Father   . High blood pressure Mother   . Diabetes Mother   .  Myasthenia gravis Mother    History  Substance Use Topics  . Smoking status: Never Smoker   . Smokeless tobacco: Never Used  . Alcohol Use: Yes     Comment: Occ   OB History    No data available     Review of Systems  Musculoskeletal: Positive for back pain.  All other systems reviewed and are negative.     Allergies  Statins and Eggs or egg-derived products  Home Medications   Prior to Admission medications   Medication Sig Start Date End Date Taking? Authorizing Provider  azelastine (ASTELIN) 0.1 % nasal spray Place 1 spray into both nostrils 2 (two) times daily. Use in each nostril as directed 08/04/14  Yes Alferd Apa Lowne, DO  Cyanocobalamin (VITAMIN B-12 PO) Take 1 tablet by mouth daily.   Yes Historical Provider, MD  fluticasone (FLONASE) 50 MCG/ACT nasal spray Place 2 sprays into both nostrils daily. 08/04/14  Yes Rosalita Chessman, DO  HYDROcodone-acetaminophen (NORCO/VICODIN) 5-325 MG per tablet Take 1-2 tablets by mouth every 6 (six) hours as needed for moderate pain. 12/31/14  Yes Daniel J Angiulli, PA-C  levothyroxine (SYNTHROID) 88 MCG tablet Take 1 tablet (88 mcg total) by mouth daily. 12/31/14  Yes Lavon Paganini Angiulli, PA-C  valsartan (DIOVAN) 320 MG tablet TAKE 1 TABLET DAILY 08/04/14  Yes Rosalita Chessman, DO  Vitamin D, Ergocalciferol, (DRISDOL) 50000 UNITS CAPS capsule Take 50,000 Units by mouth 3 (three) times a week.   Yes Historical Provider, MD  acetaminophen (TYLENOL) 325 MG tablet Take 2 tablets (650 mg total) by mouth every 6 (six) hours as needed. Patient not taking: Reported on 04/22/2015 12/16/14   Netta Cedars, MD  methocarbamol (ROBAXIN) 750 MG tablet Take 1 tablet (750 mg total) by mouth every 6 (six) hours as needed for muscle spasms. Patient not taking: Reported on 04/22/2015 12/31/14   Lavon Paganini Angiulli, PA-C   BP 143/70 mmHg  Pulse 93  Temp(Src) 98 F (36.7 C) (Oral)  Resp 20  SpO2 95% Physical Exam  Constitutional: She is oriented to person, place, and  time. She appears well-developed and well-nourished.  HENT:  Head: Normocephalic and atraumatic.  Right Ear: External ear normal.  Left Ear: External ear normal.  Eyes: Conjunctivae and EOM are normal. Pupils are equal, round, and reactive to light.  Neck: Normal range of motion. Neck supple.  Cardiovascular: Normal rate, regular rhythm, normal heart sounds and intact distal pulses.   Pulmonary/Chest: Effort normal and breath sounds normal.  Abdominal: Soft. Bowel sounds are normal. There is no tenderness.  Musculoskeletal:       Left hip: She exhibits decreased range of motion, tenderness and bony tenderness.       Left upper leg: She exhibits tenderness and bony tenderness.  Neurological: She is alert and oriented to person, place, and time.  Skin: Skin is warm and dry.  Vitals reviewed.   ED Course  Procedures (including critical care time) Labs Review Labs Reviewed  COMPREHENSIVE METABOLIC PANEL  CBC WITH DIFFERENTIAL/PLATELET  URINALYSIS, ROUTINE W REFLEX MICROSCOPIC    Imaging Review Dg Pelvis 1-2 Views  04/22/2015   CLINICAL DATA:  Acute onset of left leg pain.  Initial encounter.  EXAM: PELVIS - 1-2 VIEW  COMPARISON:  None.  FINDINGS: There is a fracture through the proximal aspect of the intramedullary rod, at its intersection with the hip screws, with angulation of the proximal portion of the intramedullary rod. Underlying known intertrochanteric fracture fragments are difficult to fully assess. The left femoral head remains seated at the acetabulum. The right hip joint is unremarkable in appearance.  Mild degenerative change is noted at the lower lumbar spine. The visualized bowel gas pattern is grossly unremarkable.  IMPRESSION: Failure of the proximal aspect of the left femoral intramedullary rod, with a fracture through the rod at its intersection with the hip screws, and angulation of the proximal portion of the intramedullary rod. Underlying intertrochanteric fracture  fragment alignment not well assessed.   Electronically Signed   By: Garald Balding M.D.   On: 04/22/2015 06:34   Dg Femur Min 2 Views Left  04/22/2015   CLINICAL DATA:  Acute onset of left leg pain. Unable to bear weight. Initial encounter.  EXAM: LEFT FEMUR 2 VIEWS  COMPARISON:  None.  FINDINGS: There appears to be a fracture through the proximal aspect of the patient's intramedullary rod, at the level at which it intersects with the hip screws. The proximal intramedullary rod is abnormally angulated. There is some degree of displacement of underlying intertrochanteric fracture fragments, difficult to fully assess.  The left femoral head remains seated at the acetabulum. No significant soft tissue abnormalities are characterized on radiograph. The knee joint is grossly unremarkable, with underlying mild osteoarthritis.  IMPRESSION: Failure  of the proximal aspect of the patient's intramedullary rod, with a fracture through the rod, at the level at which it intersects with the hip screws. The proximal intramedullary rod is abnormally angulated. Some degree of displacement of underlying intertrochanteric fracture fragments, difficult to fully assess.   Electronically Signed   By: Garald Balding M.D.   On: 04/22/2015 06:27     EKG Interpretation None      MDM   Final diagnoses:  Leg pain  Leg pain  Hip pain    64 y.o. female with pertinent PMH of prior femur fx 4 months ago presents with atraumatic increase in left hip and femur pain over the past 3-4 days. She was seen by her orthopedist yesterday and had x-rays with questionable hardware failure. She presented tonight due to inability to walk due to pain. On arrival the patient vital signs and physical exam as above. Patient had dopplerable and equal pulses bilaterally, no significant difference in size of either leg and no obvious edema. Workup as above demonstrated obvious hardware failure of the left hip. I spoke with orthopedics on call who  requested medical admission.   I have reviewed all laboratory and imaging studies if ordered as above  1. Leg pain   2. Leg pain   3. Hip pain   4. Preop exam for internal medicine         Debby Freiberg, MD 04/22/15 [phone]

## 2015-04-22 NOTE — ED Notes (Signed)
Patient reports pain in her left leg for the past few days. States she had surgery on her femur for a break in December and had metal rods and screws placed, reports healing has been going well until a few days ago, denies any injuries to leg. Pt reports weight bearing is extremely painful, pain begins in hip and radiates all the way down leg.

## 2015-04-22 NOTE — ED Notes (Signed)
MD at bedside. 

## 2015-04-22 NOTE — Progress Notes (Signed)
Orthopedic Tech Progress Note Patient Details:  [name] Jul 20, 1951 [id number] Patient did not want overhead frame. Patient ID: [name], female   DOB: Mar 25, 1951, 64 y.o.   MRN: [id number]   Braulio Bosch 04/22/2015, 6:57 PM

## 2015-04-22 NOTE — ED Notes (Addendum)
Pt in via EMS to triage, c/o left leg pain, states she broke her femur in December and has residual pain from that, pt had increased activity over the weekend which caused pain, seen by orthopedic MD yesterday but told to come here if unable to control pain at home. No new injuries or falls, but patient states she is unable to bear weight on her left leg due to pain

## 2015-04-23 DIAGNOSIS — R600 Localized edema: Secondary | ICD-10-CM

## 2015-04-23 LAB — URINE CULTURE
COLONY COUNT: NO GROWTH
CULTURE: NO GROWTH

## 2015-04-23 LAB — VITAMIN D 25 HYDROXY (VIT D DEFICIENCY, FRACTURES): VIT D 25 HYDROXY: 28.3 ng/mL — AB (ref 30.0–100.0)

## 2015-04-23 LAB — SURGICAL PCR SCREEN
MRSA, PCR: NEGATIVE
Staphylococcus aureus: NEGATIVE

## 2015-04-23 MED ORDER — ONDANSETRON HCL 4 MG/2ML IJ SOLN
4.0000 mg | Freq: Four times a day (QID) | INTRAMUSCULAR | Status: DC | PRN
  Administered 2015-04-23 – 2015-04-28 (×2): 4 mg via INTRAVENOUS
  Filled 2015-04-23 (×2): qty 2

## 2015-04-23 MED ORDER — DIPHENHYDRAMINE-ZINC ACETATE 2-0.1 % EX CREA
TOPICAL_CREAM | Freq: Two times a day (BID) | CUTANEOUS | Status: DC | PRN
  Administered 2015-04-23: 23:00:00 via TOPICAL
  Filled 2015-04-23: qty 28

## 2015-04-23 MED ORDER — CEFAZOLIN SODIUM-DEXTROSE 2-3 GM-% IV SOLR
2.0000 g | INTRAVENOUS | Status: DC
  Filled 2015-04-23: qty 50

## 2015-04-23 MED ORDER — CEFAZOLIN SODIUM-DEXTROSE 2-3 GM-% IV SOLR
2.0000 g | INTRAVENOUS | Status: AC
  Administered 2015-04-24: 2 g via INTRAVENOUS
  Filled 2015-04-23: qty 50

## 2015-04-23 MED ORDER — CHLORHEXIDINE GLUCONATE 4 % EX LIQD
60.0000 mL | Freq: Once | CUTANEOUS | Status: AC
  Filled 2015-04-23: qty 60

## 2015-04-23 MED ORDER — CHLORHEXIDINE GLUCONATE 4 % EX LIQD
60.0000 mL | Freq: Once | CUTANEOUS | Status: AC
  Administered 2015-04-24: 4 via TOPICAL
  Filled 2015-04-23: qty 60

## 2015-04-23 MED ORDER — CEFAZOLIN SODIUM-DEXTROSE 2-3 GM-% IV SOLR: 2.0000 g | INTRAVENOUS | Status: DC

## 2015-04-23 NOTE — Progress Notes (Signed)
PROGRESS NOTE  [name] [id number] DOB: 04/19/1951 DOA: 04/22/2015 PCP: Jerlyn Ly, MD  Assessment/Plan: Closed hip fracture -Admit to orthopedic floor Surgery on Friday -Provide appropriate narcotic analgesia -ck routine preoperative EKG and chest x-ray  -Insert Foley catheter   HTN  -Therapeutic substitution of preadmission Diovan with Cozaar -Current blood pressure modestly controlled noting slight elevations related to hip pain   Hypothyroidism -Continue Synthroid   Leg edema, left -Likely related to current process of failure of surgical rod in left leg -As precaution we'll check lower extremity venous duplex to rule out DVT  Code Status: full Family Communication: patient/husband Disposition Plan:    Consultants:    Procedures:      HPI/Subjective: C/o dry nose Asking for letter for work  Objective: Filed Vitals:   04/23/15 0734  BP:   Pulse:   Temp:   Resp: 16    Intake/Output Summary (Last 24 hours) at 04/23/15 0837 Last data filed at 04/23/15 0742  Gross per 24 hour  Intake 1002.5 ml  Output   1000 ml  Net    2.5 ml   Filed Weights   04/23/15 0734  Weight: 87.181 kg (192 lb 3.2 oz)    Exam:   General:  A+Ox3, NAD  Cardiovascular: rrr  Respiratory: clear  Abdomen: +BS, soft  Musculoskeletal: min edema   Data Reviewed: Basic Metabolic Panel:  Recent Labs Lab 04/22/15 0709 04/22/15 1219 04/22/15 1518  NA 134* 137  --   K 3.6 3.4*  --   CL 102 104  --   CO2 24 26  --   GLUCOSE 147* 146*  --   BUN 22 18  --   CREATININE 0.67 0.64  --   CALCIUM 9.2 9.1 9.2   Liver Function Tests:  Recent Labs Lab 04/22/15 0709 04/22/15 1219 04/22/15 1518  AST 16 18  --   ALT 21 19  --   ALKPHOS 90 86  --   BILITOT 0.5 0.6  --   PROT 6.7 6.1  --   ALBUMIN 3.7 3.4* 3.6   No results for input(s): LIPASE, AMYLASE in the last 168 hours. No results for input(s): AMMONIA in the last 168 hours. CBC:  Recent  Labs Lab 04/22/15 0709  WBC 10.2  NEUTROABS 7.5  HGB 12.3  HCT 37.2  MCV 83.4  PLT 246   Cardiac Enzymes: No results for input(s): CKTOTAL, CKMB, CKMBINDEX, TROPONINI in the last 168 hours. BNP (last 3 results) No results for input(s): BNP in the last 8760 hours.  ProBNP (last 3 results) No results for input(s): PROBNP in the last 8760 hours.  CBG: No results for input(s): GLUCAP in the last 168 hours.  No results found for this or any previous visit (from the past 240 hour(s)).   Studies: Dg Chest 1 View  04/22/2015   CLINICAL DATA:  Progressive left leg pain. Failure of the proximal aspect of left femoral intramedullary rod.  EXAM: CHEST  1 VIEW  COMPARISON:  12/15/2014  FINDINGS: Speckled density in the right proximal humerus measuring 9.2 cm in length probably representing enchondroma.  Atherosclerotic aortic arch. Heart size within normal limits. The lungs appear clear.  IMPRESSION: 1. No active cardiopulmonary disease is radiographically apparent. 2. Large right proximal humeral enchondroma. In the absence of symptoms, this does not require further workup.   Electronically Signed   By: Van Clines M.D.   On: 04/22/2015 08:58   Dg Pelvis 1-2 Views  04/22/2015   CLINICAL DATA:  Acute onset of left leg pain.  Initial encounter.  EXAM: PELVIS - 1-2 VIEW  COMPARISON:  None.  FINDINGS: There is a fracture through the proximal aspect of the intramedullary rod, at its intersection with the hip screws, with angulation of the proximal portion of the intramedullary rod. Underlying known intertrochanteric fracture fragments are difficult to fully assess. The left femoral head remains seated at the acetabulum. The right hip joint is unremarkable in appearance.  Mild degenerative change is noted at the lower lumbar spine. The visualized bowel gas pattern is grossly unremarkable.  IMPRESSION: Failure of the proximal aspect of the left femoral intramedullary rod, with a fracture through the  rod at its intersection with the hip screws, and angulation of the proximal portion of the intramedullary rod. Underlying intertrochanteric fracture fragment alignment not well assessed.   Electronically Signed   By: Garald Balding M.D.   On: 04/22/2015 06:34   Ct Lumbar Spine Wo Contrast  04/22/2015   CLINICAL DATA:  Low back and left hip pain. Lumbar compression fracture. Initial encounter.  EXAM: CT LUMBAR SPINE WITHOUT CONTRAST  TECHNIQUE: Multidetector CT imaging of the lumbar spine was performed without intravenous contrast administration. Multiplanar CT image reconstructions were also generated.  COMPARISON:  None.  FINDINGS: The patient has a compression fracture of L4 with vertebral body height loss of up to 95%. There is bony retropulsion off the superior endplate as described below. The fracture is of indeterminate chronicity. No extension into the posterior elements is identified. No other fracture is seen. Vertebral body alignment is maintained. Imaged paraspinous structures demonstrate scattered aortic atherosclerosis without aneurysm.  T11-12: The disc is calcified. No bulge or protrusion. The central canal and foramina appear open.  T12-L1: The disc is calcified. No bulge or protrusion. The central canal and foramina appear open.  L1-2:  Negative.  L2-3: There is some facet degenerative disease. No disc bulge or protrusion. The central canal and foramina are open.  L3-4: Bony retropulsion off the superior endplate of L4 results in moderately severe central canal stenosis. Moderate bilateral foraminal narrowing is also seen.  L4-5: Advanced facet degenerative disease is seen. There is a shallow disc bulge and some ligamentum flavum thickening. Moderate to moderately severe central canal stenosis is present. Left worse than right foraminal narrowing is identified.  L5-S1: Shallow disc bulge and endplate spur without central canal narrowing. Moderately severe to severe foraminal narrowing appears  worse on the left.  IMPRESSION: Age-indeterminate L4 compression fracture with up to 95 % vertebral body height loss. Bony retropulsion off the superior endplate of L4 results in moderately severe central canal stenosis at L3-4.  Moderate to moderately severe central canal stenosis L4-5 where there is left worse than right foraminal narrowing.  Moderately severe to severe bilateral foraminal narrowing L5-S1 is worse on the left.   Electronically Signed   By: Inge Rise M.D.   On: 04/22/2015 09:13   Dg Femur Min 2 Views Left  04/22/2015   CLINICAL DATA:  Acute onset of left leg pain. Unable to bear weight. Initial encounter.  EXAM: LEFT FEMUR 2 VIEWS  COMPARISON:  None.  FINDINGS: There appears to be a fracture through the proximal aspect of the patient's intramedullary rod, at the level at which it intersects with the hip screws. The proximal intramedullary rod is abnormally angulated. There is some degree of displacement of underlying intertrochanteric fracture fragments, difficult to fully assess.  The left femoral head remains seated at the acetabulum. No significant soft  tissue abnormalities are characterized on radiograph. The knee joint is grossly unremarkable, with underlying mild osteoarthritis.  IMPRESSION: Failure of the proximal aspect of the patient's intramedullary rod, with a fracture through the rod, at the level at which it intersects with the hip screws. The proximal intramedullary rod is abnormally angulated. Some degree of displacement of underlying intertrochanteric fracture fragments, difficult to fully assess.   Electronically Signed   By: Garald Balding M.D.   On: 04/22/2015 06:27    Scheduled Meds: . docusate sodium  100 mg Oral BID  . ferrous sulfate  325 mg Oral TID PC  . heparin  5,000 Units Subcutaneous 3 times per day  . irbesartan  37.5 mg Oral Daily  . levothyroxine  88 mcg Oral QAC breakfast  . Vitamin D (Ergocalciferol)  50,000 Units Oral Once per day on Mon Wed Fri    Continuous Infusions: . dextrose 5 % and 0.9 % NaCl with KCl 20 mEq/L 75 mL/hr at 04/23/15 0110   Antibiotics Given (last 72 hours)    None      Active Problems:   HTN (hypertension)   Hypothyroidism   Closed left hip fracture   Closed hip fracture   Leg edema, left    Time spent: 25 min    VANN, JESSICA  [facility] Pager [phone] If 7PM-7AM, please contact night-coverage at [email or web address], password [facility] 04/23/2015, 8:37 AM  LOS: 1 day

## 2015-04-23 NOTE — Progress Notes (Signed)
Utilization review complete 

## 2015-04-23 NOTE — Progress Notes (Signed)
Spoke with [facility] staff to very authorization for scheduled revision of hip Friday. They would not address request for authorization because pt was admitted to hospital. Hulen Skains [place] and left message requesting callback. Awaiting callback

## 2015-04-23 NOTE — Progress Notes (Signed)
Benefit check sent to CM assistant to very auth for surgery tomorrw.

## 2015-04-23 NOTE — Progress Notes (Signed)
PT Cancellation Note  Patient Details Name: [name] MRN: [id number] DOB: 12-23-1951   Cancelled Treatment:    Reason Eval/Treat Not Completed: Patient not medically ready; patient currently on bedrest and awaiting surgical fixation of left femur non-union/harware failure currently scheduled for tomorrow.  Will plan to see following surgery once new orders received.  Thank you.   Jamieon Lannen,CYNDI 04/23/2015, 8:37 AM [name], PT [phone] 04/23/2015

## 2015-04-23 NOTE — Progress Notes (Signed)
VASCULAR LAB PRELIMINARY  PRELIMINARY  PRELIMINARY  PRELIMINARY  Bilateral lower extremity venous Dopplers completed.    Preliminary report:  There is no DVT or SVT noted in the bilateral lower extremities.   Uvaldo Rybacki, RVT 04/23/2015, 3:54 PM

## 2015-04-23 NOTE — Progress Notes (Signed)
OT Cancellation Note  Patient Details Name: [name] MRN: [id number] DOB: 11/28/1951   Cancelled Treatment:    Reason Eval/Treat Not Completed: Patient not medically ready Pt currently on bedrest. Surgery scheduled for tomorrow. Will reassess after surgery as appropriate.  [place], OTR/L  [phone] 04/23/2015 04/23/2015, 2:35 PM

## 2015-04-23 NOTE — Progress Notes (Signed)
Patient complaining of feeling itchy. No new medications given recently. Patient asking for benadryl cream. MD notified.

## 2015-04-24 ENCOUNTER — Encounter (HOSPITAL_COMMUNITY)
Admission: EM | Disposition: A | Discharge status: Rehabilitation Facility | Payer: 59 | Source: Home / Self Care | Attending: Internal Medicine

## 2015-04-24 ENCOUNTER — Inpatient Hospital Stay (HOSPITAL_COMMUNITY): Payer: 59 | Admitting: Anesthesiology

## 2015-04-24 ENCOUNTER — Inpatient Hospital Stay (HOSPITAL_COMMUNITY): Payer: 59

## 2015-04-24 HISTORY — PX: TOTAL HIP ARTHROPLASTY: SHX124

## 2015-04-24 LAB — CBC
HCT: 29.9 % — ABNORMAL LOW (ref 36.0–46.0)
HEMOGLOBIN: 9.6 g/dL — AB (ref 12.0–15.0)
MCH: 27.4 pg (ref 26.0–34.0)
MCHC: 32.1 g/dL (ref 30.0–36.0)
MCV: 85.2 fL (ref 78.0–100.0)
Platelets: 303 10*3/uL (ref 150–400)
RBC: 3.51 MIL/uL — ABNORMAL LOW (ref 3.87–5.11)
RDW: 13.8 % (ref 11.5–15.5)
WBC: 21.4 10*3/uL — ABNORMAL HIGH (ref 4.0–10.5)

## 2015-04-24 LAB — CREATININE, SERUM
Creatinine, Ser: 0.7 mg/dL (ref 0.50–1.10)
GFR calc Af Amer: >90 mL/min (ref >90)
GFR calc non Af Amer: >90 mL/min (ref >90)

## 2015-04-24 LAB — ABO/RH: ABO/RH(D): A POS

## 2015-04-24 SURGERY — ARTHROPLASTY, HIP, TOTAL,POSTERIOR APPROACH
Anesthesia: General | Site: Hip | Laterality: Left

## 2015-04-24 MED ORDER — ROCURONIUM BROMIDE 50 MG/5ML IV SOLN
INTRAVENOUS | Status: AC
  Filled 2015-04-24: qty 1

## 2015-04-24 MED ORDER — FENTANYL CITRATE (PF) 250 MCG/5ML IJ SOLN
INTRAMUSCULAR | Status: AC
  Filled 2015-04-24: qty 5

## 2015-04-24 MED ORDER — SODIUM CHLORIDE 0.9 % IR SOLN
Status: DC | PRN
  Administered 2015-04-24: 1000 mL

## 2015-04-24 MED ORDER — LACTATED RINGERS IV SOLN
INTRAVENOUS | Status: DC | PRN
  Administered 2015-04-24 (×3): via INTRAVENOUS

## 2015-04-24 MED ORDER — ALUM & MAG HYDROXIDE-SIMETH 200-200-20 MG/5ML PO SUSP: 30.0000 mL | ORAL | Status: DC | PRN

## 2015-04-24 MED ORDER — DIPHENHYDRAMINE HCL 25 MG PO CAPS
25.0000 mg | ORAL_CAPSULE | Freq: Four times a day (QID) | ORAL | Status: DC | PRN
  Administered 2015-04-27: 25 mg via ORAL
  Filled 2015-04-24: qty 1

## 2015-04-24 MED ORDER — SODIUM CHLORIDE 0.9 % IV SOLN
1000.0000 mg | INTRAVENOUS | Status: AC
  Administered 2015-04-24: 1000 mg via INTRAVENOUS
  Filled 2015-04-24: qty 10

## 2015-04-24 MED ORDER — PROPOFOL 10 MG/ML IV BOLUS
INTRAVENOUS | Status: DC | PRN
  Administered 2015-04-24: 120 mg via INTRAVENOUS

## 2015-04-24 MED ORDER — BISACODYL 10 MG RE SUPP: 10.0000 mg | Freq: Every day | RECTAL | Status: DC | PRN

## 2015-04-24 MED ORDER — VANCOMYCIN HCL 1000 MG IV SOLR
INTRAVENOUS | Status: AC
  Filled 2015-04-24: qty 1000

## 2015-04-24 MED ORDER — ARTIFICIAL TEARS OP OINT
TOPICAL_OINTMENT | OPHTHALMIC | Status: DC | PRN
Start: 2015-04-24 — End: 2015-04-24
  Administered 2015-04-24: 1 via OPHTHALMIC

## 2015-04-24 MED ORDER — ONDANSETRON HCL 4 MG/2ML IJ SOLN
INTRAMUSCULAR | Status: AC
  Filled 2015-04-24: qty 2

## 2015-04-24 MED ORDER — ALBUMIN HUMAN 5 % IV SOLN
INTRAVENOUS | Status: DC | PRN
Start: 2015-04-24 — End: 2015-04-24
  Administered 2015-04-24 (×2): via INTRAVENOUS

## 2015-04-24 MED ORDER — VANCOMYCIN HCL 1000 MG IV SOLR
1000.0000 mg | INTRAVENOUS | Status: DC | PRN
  Administered 2015-04-24: 1000 mg via INTRAVENOUS

## 2015-04-24 MED ORDER — METOCLOPRAMIDE HCL 10 MG PO TABS: 5.0000 mg | ORAL_TABLET | Freq: Three times a day (TID) | ORAL | Status: DC | PRN

## 2015-04-24 MED ORDER — GLYCOPYRROLATE 0.2 MG/ML IJ SOLN
INTRAMUSCULAR | Status: DC | PRN
  Administered 2015-04-24: 0.6 mg via INTRAVENOUS

## 2015-04-24 MED ORDER — POLYETHYLENE GLYCOL 3350 17 G PO PACK
17.0000 g | PACK | Freq: Every day | ORAL | Status: DC | PRN
  Filled 2015-04-24: qty 1

## 2015-04-24 MED ORDER — FENTANYL CITRATE (PF) 100 MCG/2ML IJ SOLN
INTRAMUSCULAR | Status: DC | PRN
  Administered 2015-04-24 (×2): 100 ug via INTRAVENOUS
  Administered 2015-04-24: 50 ug via INTRAVENOUS
  Administered 2015-04-24: 100 ug via INTRAVENOUS
  Administered 2015-04-24 (×6): 50 ug via INTRAVENOUS

## 2015-04-24 MED ORDER — ENOXAPARIN SODIUM 40 MG/0.4ML ~~LOC~~ SOLN
40.0000 mg | SUBCUTANEOUS | Status: DC
  Administered 2015-04-25 – 2015-04-28 (×4): 40 mg via SUBCUTANEOUS
  Filled 2015-04-24 (×6): qty 0.4

## 2015-04-24 MED ORDER — METOCLOPRAMIDE HCL 5 MG/ML IJ SOLN
5.0000 mg | Freq: Three times a day (TID) | INTRAMUSCULAR | Status: DC | PRN
  Filled 2015-04-24: qty 2

## 2015-04-24 MED ORDER — LIDOCAINE HCL (CARDIAC) 20 MG/ML IV SOLN
INTRAVENOUS | Status: AC
  Filled 2015-04-24: qty 5

## 2015-04-24 MED ORDER — PROPOFOL 10 MG/ML IV BOLUS
INTRAVENOUS | Status: AC
  Filled 2015-04-24: qty 20

## 2015-04-24 MED ORDER — CEFAZOLIN SODIUM-DEXTROSE 2-3 GM-% IV SOLR
2.0000 g | Freq: Four times a day (QID) | INTRAVENOUS | Status: AC
  Administered 2015-04-24 (×2): 2 g via INTRAVENOUS
  Filled 2015-04-24 (×3): qty 50

## 2015-04-24 MED ORDER — HYDROMORPHONE HCL 1 MG/ML IJ SOLN
0.2500 mg | INTRAMUSCULAR | Status: DC | PRN
  Administered 2015-04-24 (×2): 0.5 mg via INTRAVENOUS

## 2015-04-24 MED ORDER — ONDANSETRON HCL 4 MG/2ML IJ SOLN
INTRAMUSCULAR | Status: DC | PRN
  Administered 2015-04-24: 4 mg via INTRAVENOUS

## 2015-04-24 MED ORDER — NEOSTIGMINE METHYLSULFATE 10 MG/10ML IV SOLN
INTRAVENOUS | Status: DC | PRN
  Administered 2015-04-24: 4 mg via INTRAVENOUS

## 2015-04-24 MED ORDER — METOCLOPRAMIDE HCL 5 MG/ML IJ SOLN
INTRAMUSCULAR | Status: DC | PRN
  Administered 2015-04-24: 10 mg via INTRAVENOUS

## 2015-04-24 MED ORDER — SUCCINYLCHOLINE CHLORIDE 20 MG/ML IJ SOLN
INTRAMUSCULAR | Status: AC
  Filled 2015-04-24: qty 1

## 2015-04-24 MED ORDER — ROCURONIUM BROMIDE 100 MG/10ML IV SOLN
INTRAVENOUS | Status: DC | PRN
  Administered 2015-04-24 (×2): 10 mg via INTRAVENOUS
  Administered 2015-04-24: 50 mg via INTRAVENOUS

## 2015-04-24 MED ORDER — SODIUM CHLORIDE 0.9 % IV SOLN: Freq: Once | INTRAVENOUS | Status: DC

## 2015-04-24 MED ORDER — DEXAMETHASONE SODIUM PHOSPHATE 4 MG/ML IJ SOLN
INTRAMUSCULAR | Status: AC
  Filled 2015-04-24: qty 1

## 2015-04-24 MED ORDER — MENTHOL 3 MG MT LOZG
1.0000 | LOZENGE | OROMUCOSAL | Status: DC | PRN
  Filled 2015-04-24: qty 9

## 2015-04-24 MED ORDER — HYDROCODONE-ACETAMINOPHEN 5-325 MG PO TABS
1.0000 | ORAL_TABLET | ORAL | Status: DC | PRN
  Administered 2015-04-25 – 2015-04-26 (×8): 2 via ORAL
  Administered 2015-04-26: 1 via ORAL
  Administered 2015-04-27 – 2015-04-28 (×4): 2 via ORAL
  Filled 2015-04-24 (×5): qty 2
  Filled 2015-04-24: qty 1
  Filled 2015-04-24 (×5): qty 2
  Filled 2015-04-24: qty 1
  Filled 2015-04-24 (×2): qty 2

## 2015-04-24 MED ORDER — HYDROMORPHONE HCL 1 MG/ML IJ SOLN
INTRAMUSCULAR | Status: AC
  Administered 2015-04-24: 1 mg
  Filled 2015-04-24: qty 1

## 2015-04-24 MED ORDER — MAGNESIUM CITRATE PO SOLN
1.0000 | Freq: Once | ORAL | Status: AC | PRN
  Filled 2015-04-24: qty 296

## 2015-04-24 MED ORDER — PHENYLEPHRINE HCL 10 MG/ML IJ SOLN
INTRAMUSCULAR | Status: DC | PRN
  Administered 2015-04-24: 40 ug via INTRAVENOUS

## 2015-04-24 MED ORDER — VANCOMYCIN HCL IN DEXTROSE 1-5 GM/200ML-% IV SOLN
INTRAVENOUS | Status: AC
  Filled 2015-04-24: qty 200

## 2015-04-24 MED ORDER — PHENOL 1.4 % MT LIQD
1.0000 | OROMUCOSAL | Status: DC | PRN
  Filled 2015-04-24: qty 177

## 2015-04-24 MED ORDER — LIDOCAINE HCL (CARDIAC) 20 MG/ML IV SOLN
INTRAVENOUS | Status: DC | PRN
  Administered 2015-04-24: 60 mg via INTRAVENOUS

## 2015-04-24 SURGICAL SUPPLY — 91 items
ADAPTER BIOLOX TAPER T1 STD (Miscellaneous) ×3 IMPLANT
BENZOIN TINCTURE PRP APPL 2/3 (GAUZE/BANDAGES/DRESSINGS) ×3 IMPLANT
BIT DRILL RINGLOC QUICK CONN (BIT) ×1 IMPLANT
BLADE SAW SAG 73X25 THK (BLADE) ×2
BLADE SAW SGTL 73X25 THK (BLADE) ×1 IMPLANT
BRUSH FEMORAL CANAL (MISCELLANEOUS) IMPLANT
CABLE (Orthopedic Implant) ×3 IMPLANT
CLOSURE STERI-STRIP 1/2X4 (GAUZE/BANDAGES/DRESSINGS) ×1
CLOSURE WOUND 1/2 X4 (GAUZE/BANDAGES/DRESSINGS)
CLSR STERI-STRIP ANTIMIC 1/2X4 (GAUZE/BANDAGES/DRESSINGS) ×2 IMPLANT
COVER BACK TABLE 24X17X13 BIG (DRAPES) IMPLANT
DERMABOND ADVANCED (GAUZE/BANDAGES/DRESSINGS) ×4
DERMABOND ADVANCED .7 DNX12 (GAUZE/BANDAGES/DRESSINGS) ×2 IMPLANT
DRAPE IMP U-DRAPE 54X76 (DRAPES) ×3 IMPLANT
DRAPE INCISE IOBAN 66X45 STRL (DRAPES) IMPLANT
DRAPE INCISE IOBAN 85X60 (DRAPES) ×3 IMPLANT
DRAPE ORTHO SPLIT 77X108 STRL (DRAPES) ×4
DRAPE SURG ORHT 6 SPLT 77X108 (DRAPES) ×2 IMPLANT
DRAPE U-SHAPE 47X51 STRL (DRAPES) ×3 IMPLANT
DRILL BIT 7/64X5 (BIT) IMPLANT
DRILL BIT RINGLOC QUICK CONN (BIT) ×2
DRSG AQUACEL AG ADV 3.5X14 (GAUZE/BANDAGES/DRESSINGS) ×3 IMPLANT
DRSG MEPILEX BORDER 4X12 (GAUZE/BANDAGES/DRESSINGS) ×3 IMPLANT
DRSG MEPILEX BORDER 4X4 (GAUZE/BANDAGES/DRESSINGS) ×3 IMPLANT
DRSG MEPILEX BORDER 4X8 (GAUZE/BANDAGES/DRESSINGS) ×3 IMPLANT
DURAPREP 26ML APPLICATOR (WOUND CARE) ×3 IMPLANT
ELECT BLADE 4.0 EZ CLEAN MEGAD (MISCELLANEOUS) ×3
ELECT REM PT RETURN 9FT ADLT (ELECTROSURGICAL) ×3
ELECTRODE BLDE 4.0 EZ CLN MEGD (MISCELLANEOUS) ×1 IMPLANT
ELECTRODE REM PT RTRN 9FT ADLT (ELECTROSURGICAL) ×1 IMPLANT
EVACUATOR 1/8 PVC DRAIN (DRAIN) IMPLANT
FACESHIELD WRAPAROUND (MASK) ×6 IMPLANT
FLOSEAL 10ML (HEMOSTASIS) IMPLANT
GLOVE BIOGEL PI IND STRL 7.5 (GLOVE) ×1 IMPLANT
GLOVE BIOGEL PI IND STRL 8 (GLOVE) ×2 IMPLANT
GLOVE BIOGEL PI INDICATOR 7.5 (GLOVE) ×2
GLOVE BIOGEL PI INDICATOR 8 (GLOVE) ×4
GLOVE ECLIPSE 8.0 STRL XLNG CF (GLOVE) ×3 IMPLANT
GLOVE ORTHO TXT STRL SZ7.5 (GLOVE) ×3 IMPLANT
GLOVE SURG ORTHO 8.0 STRL STRW (GLOVE) ×3 IMPLANT
GOWN STRL REIN 3XL XLG LVL4 (GOWN DISPOSABLE) ×3 IMPLANT
GOWN STRL REUS W/ TWL LRG LVL3 (GOWN DISPOSABLE) ×1 IMPLANT
GOWN STRL REUS W/TWL LRG LVL3 (GOWN DISPOSABLE) ×2
GUIDEWIRE BALL NOSE 80CM (WIRE) ×3 IMPLANT
HANDPIECE INTERPULSE COAX TIP (DISPOSABLE)
HEAD CERAMIC BIOLOX 36MM (Head) ×3 IMPLANT
HIP ACETABULAR SCREW 6.5X20MM (Hips) ×3 IMPLANT
HIP ACETABULAR SCREW 6.5X25MM (Hips) ×3 IMPLANT
HIP REVISION SYS 100MM CLAW (Hips) ×3 IMPLANT
HIP REVISION SYS 34MM BOLT LAT (Hips) ×3 IMPLANT
HIP REVISION SYS PROX BODY 60 (Hips) ×3 IMPLANT
HIP SHELL ACETAB 3H 50MM (Hips) ×3 IMPLANT
IMMOBILIZER KNEE 20 (SOFTGOODS) IMPLANT
IMMOBILIZER KNEE 22 UNIV (SOFTGOODS) IMPLANT
IMMOBILIZER KNEE 24 THIGH 36 (MISCELLANEOUS) IMPLANT
IMMOBILIZER KNEE 24 UNIV (MISCELLANEOUS)
KIT BASIN OR (CUSTOM PROCEDURE TRAY) ×3 IMPLANT
KIT ROOM TURNOVER OR (KITS) ×3 IMPLANT
LINER ACETABULAR 36MM HIP (Hips) ×3 IMPLANT
MANIFOLD NEPTUNE II (INSTRUMENTS) ×3 IMPLANT
NS IRRIG 1000ML POUR BTL (IV SOLUTION) ×3 IMPLANT
PACK TOTAL JOINT (CUSTOM PROCEDURE TRAY) ×3 IMPLANT
PACK UNIVERSAL I (CUSTOM PROCEDURE TRAY) ×3 IMPLANT
PAD ARMBOARD 7.5X6 YLW CONV (MISCELLANEOUS) ×6 IMPLANT
PRESSURIZER FEMORAL UNIV (MISCELLANEOUS) IMPLANT
REVISION SYS HIP 100MM CLAW (Hips) ×1 IMPLANT
SCREW ACETABULAR HIP 6.5X20MM (Hips) ×1 IMPLANT
SCREW ACETABULAR HIP 6.5X25MM (Hips) ×1 IMPLANT
SET HNDPC FAN SPRY TIP SCT (DISPOSABLE) IMPLANT
SHELL ACETAB HIP 3H 50MM (Hips) ×1 IMPLANT
SPONGE LAP 18X18 X RAY DECT (DISPOSABLE) ×9 IMPLANT
SPONGE LAP 4X18 X RAY DECT (DISPOSABLE) IMPLANT
STAPLER VISISTAT 35W (STAPLE) IMPLANT
STEM DISTAL W/LOCK SCRW 15X150 (Stem) ×3 IMPLANT
STRIP CLOSURE SKIN 1/2X4 (GAUZE/BANDAGES/DRESSINGS) IMPLANT
SUCTION FRAZIER TIP 10 FR DISP (SUCTIONS) ×3 IMPLANT
SUT ETHIBOND NAB CT1 #1 30IN (SUTURE) ×3 IMPLANT
SUT MNCRL AB 3-0 PS2 18 (SUTURE) ×3 IMPLANT
SUT VIC AB 0 CT1 27 (SUTURE) ×4
SUT VIC AB 0 CT1 27XBRD ANBCTR (SUTURE) ×2 IMPLANT
SUT VIC AB 1 CT1 27 (SUTURE) ×6
SUT VIC AB 1 CT1 27XBRD ANBCTR (SUTURE) ×3 IMPLANT
SUT VIC AB 2-0 CT1 27 (SUTURE) ×6
SUT VIC AB 2-0 CT1 TAPERPNT 27 (SUTURE) ×3 IMPLANT
SUT VLOC 180 0 24IN GS25 (SUTURE) ×3 IMPLANT
TOWEL OR 17X24 6PK STRL BLUE (TOWEL DISPOSABLE) ×3 IMPLANT
TOWEL OR 17X26 10 PK STRL BLUE (TOWEL DISPOSABLE) ×3 IMPLANT
TOWER CARTRIDGE SMART MIX (DISPOSABLE) IMPLANT
TRAY FOLEY CATH 16FRSI W/METER (SET/KITS/TRAYS/PACK) IMPLANT
WATER STERILE IRR 1000ML POUR (IV SOLUTION) ×3 IMPLANT
YANKAUER SUCT BULB TIP NO VENT (SUCTIONS) ×3 IMPLANT

## 2015-04-24 NOTE — Anesthesia Preprocedure Evaluation (Addendum)
Anesthesia Evaluation  Patient identified by MRN, date of birth, ID band Patient awake    Reviewed: Allergy & Precautions, H&P , NPO status , Patient's Chart, lab work & pertinent test results  Airway Mallampati: II  TM Distance: >3 FB Neck ROM: Full    Dental no notable dental hx. (+) Teeth Intact, Dental Advisory Given   Pulmonary neg pulmonary ROS,  breath sounds clear to auscultation  Pulmonary exam normal       Cardiovascular hypertension, On Medications Rhythm:Regular Rate:Normal     Neuro/Psych negative neurological ROS  negative psych ROS   GI/Hepatic negative GI ROS, Neg liver ROS,   Endo/Other  Hypothyroidism   Renal/GU negative Renal ROS  negative genitourinary   Musculoskeletal  (+) Arthritis -, Osteoarthritis,    Abdominal   Peds  Hematology negative hematology ROS (+)   Anesthesia Other Findings   Reproductive/Obstetrics negative OB ROS                            Anesthesia Physical Anesthesia Plan  ASA: II  Anesthesia Plan: General   Post-op Pain Management:    Induction: Intravenous  Airway Management Planned: Oral ETT  Additional Equipment:   Intra-op Plan:   Post-operative Plan: Extubation in OR  Informed Consent: I have reviewed the patients History and Physical, chart, labs and discussed the procedure including the risks, benefits and alternatives for the proposed anesthesia with the patient or authorized representative who has indicated his/her understanding and acceptance.   Dental advisory given  Plan Discussed with: CRNA  Anesthesia Plan Comments:         Anesthesia Quick Evaluation

## 2015-04-24 NOTE — Progress Notes (Signed)
Patient arrived from PACU. Family at bedside. She c/o pain. MD also at bedside. Safety precautions and orders reviewed with patient/family. Will continue to monitor.   Ave Filter, RN

## 2015-04-24 NOTE — Transfer of Care (Signed)
Immediate Anesthesia Transfer of Care Note  Patient: [name]  Procedure(s) Performed: Procedure(s):  TOTAL HIP ARTHROPLASTY AND REMOVAL OF IM AFFIXUS NAIL  (Left)  Patient Location: PACU  Anesthesia Type:General  Level of Consciousness: awake, alert , oriented and patient cooperative  Airway & Oxygen Therapy: Patient Spontanous Breathing and Patient connected to face mask oxygen  Post-op Assessment: Report given to RN, Post -op Vital signs reviewed and stable and Patient moving all extremities  Post vital signs: Reviewed and stable  Last Vitals:  Filed Vitals:   04/24/15 0554  BP: 143/78  Pulse: 81  Temp: 36.7 C  Resp: 20    Complications: No apparent anesthesia complications

## 2015-04-24 NOTE — Progress Notes (Signed)
Patient ID: [name], female   DOB: 1951-05-03, 64 y.o.   MRN: [id number]  Failed ORIF of left proximal femur fracture  Reviewed radiographic findings and plans for her yesterday when I saw her NPO To OR today for conversion of failed left hip surgery to left total hip with repair of greater trochanter Consent on chart

## 2015-04-24 NOTE — Brief Op Note (Signed)
04/22/2015 - 04/24/2015  11:08 AM  PATIENT:  [name]  64 y.o. female  PRE-OPERATIVE DIAGNOSIS:  Failed left hip surgery, previous open reduction internal fixation of left comminuted proximal femur fracture   POST-OPERATIVE DIAGNOSIS:  Failed left hip surgery, previous open reduction internal fixation of left comminuted proximal femur fracture   PROCEDURE:  Procedure(s): Conversion of failed left hip surgery to left total hip replacement  Biomet  SURGEON:  Surgeon(s) and Role:    * Paralee Cancel, MD - Primary  PHYSICIAN ASSISTANT: Danae Orleans, PA-C  ASSISTANTS: April Green, RNFA  ANESTHESIA:   general  EBL:  Total I/O In: 2500 [I.V.:2000; IV Piggyback:500] Out: 710 [Urine:210; Blood:500]  BLOOD ADMINISTERED:none  DRAINS: none   LOCAL MEDICATIONS USED:  NONE  SPECIMEN:  No Specimen  DISPOSITION OF SPECIMEN:  N/A  COUNTS:  YES  TOURNIQUET:  * No tourniquets in log *  DICTATION: .Other Dictation: Dictation Number [id number]  PLAN OF CARE: Admit to inpatient   PATIENT DISPOSITION:  PACU - hemodynamically stable.   Delay start of Pharmacological VTE agent (>24hrs) due to surgical blood loss or risk of bleeding: no

## 2015-04-24 NOTE — H&P (View-Only) (Signed)
Patient ID: [name], female   DOB: 07/27/1951, 64 y.o.   MRN: [id number]  Failed ORIF of left proximal femur fracture  Reviewed radiographic findings and plans for her yesterday when I saw her NPO To OR today for conversion of failed left hip surgery to left total hip with repair of greater trochanter Consent on chart

## 2015-04-24 NOTE — Anesthesia Postprocedure Evaluation (Signed)
  Anesthesia Post-op Note  Patient: [name]  Procedure(s) Performed: Procedure(s):  TOTAL HIP ARTHROPLASTY AND REMOVAL OF IM AFFIXUS NAIL  (Left)  Patient Location: PACU  Anesthesia Type:General  Level of Consciousness: awake and alert   Airway and Oxygen Therapy: Patient Spontanous Breathing  Post-op Pain: none  Post-op Assessment: Post-op Vital signs reviewed, Patient's Cardiovascular Status Stable and Respiratory Function Stable  Post-op Vital Signs: Reviewed  Filed Vitals:   04/24/15 1222  BP:   Pulse: 107  Temp:   Resp: 18    Complications: No apparent anesthesia complications

## 2015-04-24 NOTE — Interval H&P Note (Signed)
History and Physical Interval Note:  04/24/2015 7:42 AM  [name]  has presented today for surgery, with the diagnosis of LEFT FAILED IM NAIL   The various methods of treatment have been discussed with the patient and family. After consideration of risks, benefits and other options for treatment, the patient has consented to  Procedure(s):  TOTAL HIP ARTHROPLASTY AND REMOVAL OF IM AFFIXUS NAIL  (Left) as a surgical intervention .  The patient's history has been reviewed, patient examined, no change in status, stable for surgery.  I have reviewed the patient's chart and labs.  Questions were answered to the patient's satisfaction.     Mauri Pole

## 2015-04-24 NOTE — Progress Notes (Signed)
PROGRESS NOTE  [name] [id number] DOB: [birth year] DOA: 04/22/2015 PCP: Jerlyn Ly, MD  Assessment/Plan: Closed hip fracture -s/p surgery 4/29 -ortho PT    HTN  -continue home meds   Hypothyroidism -Continue Synthroid   Leg edema, left -duplex negative   Code Status: full Family Communication: patient/husband Disposition Plan:    Consultants:    Procedures:      HPI/Subjective: Resting after surgery C/o pain  Objective: Filed Vitals:   04/24/15 1200  BP: 146/67  Pulse: 98  Temp:   Resp: 18    Intake/Output Summary (Last 24 hours) at 04/24/15 1216 Last data filed at 04/24/15 1137  Gross per 24 hour  Intake   4760 ml  Output   2910 ml  Net   1850 ml   Filed Weights   04/23/15 0734  Weight: 87.181 kg (192 lb 3.2 oz)    Exam:   General:  resting  Cardiovascular: rrr  Respiratory: clear  Abdomen: +BS, soft  Musculoskeletal: min edema   Data Reviewed: Basic Metabolic Panel:  Recent Labs Lab 04/22/15 0709 04/22/15 1219 04/22/15 1518  NA 134* 137  --   K 3.6 3.4*  --   CL 102 104  --   CO2 24 26  --   GLUCOSE 147* 146*  --   BUN 22 18  --   CREATININE 0.67 0.64  --   CALCIUM 9.2 9.1 9.2   Liver Function Tests:  Recent Labs Lab 04/22/15 0709 04/22/15 1219 04/22/15 1518  AST 16 18  --   ALT 21 19  --   ALKPHOS 90 86  --   BILITOT 0.5 0.6  --   PROT 6.7 6.1  --   ALBUMIN 3.7 3.4* 3.6   No results for input(s): LIPASE, AMYLASE in the last 168 hours. No results for input(s): AMMONIA in the last 168 hours. CBC:  Recent Labs Lab 04/22/15 0709  WBC 10.2  NEUTROABS 7.5  HGB 12.3  HCT 37.2  MCV 83.4  PLT 246   Cardiac Enzymes: No results for input(s): CKTOTAL, CKMB, CKMBINDEX, TROPONINI in the last 168 hours. BNP (last 3 results) No results for input(s): BNP in the last 8760 hours.  ProBNP (last 3 results) No results for input(s): PROBNP in the last 8760 hours.  CBG: No results for input(s):  GLUCAP in the last 168 hours.  Recent Results (from the past 240 hour(s))  Urine culture     Status: None   Collection Time: 04/22/15  8:20 AM  Result Value Ref Range Status   Specimen Description URINE, CATHETERIZED  Final   Special Requests ADDED [id number]  Final   Colony Count NO GROWTH Performed at [facility]   Final   Culture NO GROWTH Performed at [facility]   Final   Report Status 04/23/2015 FINAL  Final  Surgical PCR screen     Status: None   Collection Time: 04/23/15  3:06 PM  Result Value Ref Range Status   MRSA, PCR NEGATIVE NEGATIVE Final   Staphylococcus aureus NEGATIVE NEGATIVE Final    Comment:        The Xpert SA Assay (FDA approved for NASAL specimens in patients over 64 years of age), is one component of a comprehensive surveillance program.  Test performance has been validated by [facility] for patients greater than or equal to 64 year old. It is not intended to diagnose infection nor to guide or monitor treatment.      Studies: No  results found.  Scheduled Meds: . sodium chloride   Intravenous Once  .  ceFAZolin (ANCEF) IV  2 g Intravenous To 283  . [MAR Hold] docusate sodium  100 mg Oral BID  . [MAR Hold] ferrous sulfate  325 mg Oral TID PC  . [MAR Hold] heparin  5,000 Units Subcutaneous 3 times per day  . HYDROmorphone      . [MAR Hold] irbesartan  37.5 mg Oral Daily  . [MAR Hold] levothyroxine  88 mcg Oral QAC breakfast  . [MAR Hold] Vitamin D (Ergocalciferol)  50,000 Units Oral Once per day on Mon Wed Fri   Continuous Infusions: . dextrose 5 % and 0.9 % NaCl with KCl 20 mEq/L 75 mL/hr at 04/23/15 2316   Antibiotics Given (last 72 hours)    Date/Time Action Medication Dose   04/24/15 0757 Given   ceFAZolin (ANCEF) IVPB 2 g/50 mL premix 2 g      Active Problems:   HTN (hypertension)   Hypothyroidism   Closed left hip fracture   Closed hip fracture   Leg edema, left    Time spent: 25 min    VANN,  JESSICA  [facility] Pager [phone] If 7PM-7AM, please contact night-coverage at [email or web address], password [facility] 04/24/2015, 12:16 PM  LOS: 2 days

## 2015-04-24 NOTE — Anesthesia Procedure Notes (Signed)
Procedure Name: Intubation Performed by: Rogers Blocker Pre-anesthesia Checklist: Patient identified, Emergency Drugs available, Suction available, Patient being monitored and Timeout performed Patient Re-evaluated:Patient Re-evaluated prior to inductionOxygen Delivery Method: Circle system utilized Preoxygenation: Pre-oxygenation with 100% oxygen Intubation Type: IV induction Ventilation: Mask ventilation without difficulty Laryngoscope Size: Mac and 3 Grade View: Grade I Tube type: Oral Tube size: 7.0 mm Number of attempts: 1 Airway Equipment and Method: Stylet Placement Confirmation: ETT inserted through vocal cords under direct vision,  positive ETCO2,  CO2 detector and breath sounds checked- equal and bilateral Secured at: 22 cm Tube secured with: Tape Dental Injury: Teeth and Oropharynx as per pre-operative assessment

## 2015-04-25 LAB — CBC
HCT: 22.8 % — ABNORMAL LOW (ref 36.0–46.0)
Hemoglobin: 7.4 g/dL — ABNORMAL LOW (ref 12.0–15.0)
MCH: 27.6 pg (ref 26.0–34.0)
MCHC: 32.5 g/dL (ref 30.0–36.0)
MCV: 85.1 fL (ref 78.0–100.0)
PLATELETS: 257 10*3/uL (ref 150–400)
RBC: 2.68 MIL/uL — ABNORMAL LOW (ref 3.87–5.11)
RDW: 13.9 % (ref 11.5–15.5)
WBC: 14.4 10*3/uL — ABNORMAL HIGH (ref 4.0–10.5)

## 2015-04-25 LAB — BASIC METABOLIC PANEL
Anion gap: 6 (ref 5–15)
BUN: 15 mg/dL (ref 6–23)
CALCIUM: 8.5 mg/dL (ref 8.4–10.5)
CO2: 26 mmol/L (ref 19–32)
Chloride: 103 mmol/L (ref 96–112)
Creatinine, Ser: 0.78 mg/dL (ref 0.50–1.10)
GFR calc Af Amer: >90 mL/min (ref >90)
GFR, EST NON AFRICAN AMERICAN: 88 mL/min — AB (ref >90)
Glucose, Bld: 172 mg/dL — ABNORMAL HIGH (ref 70–99)
Potassium: 4.5 mmol/L (ref 3.5–5.1)
Sodium: 135 mmol/L (ref 135–145)

## 2015-04-25 LAB — PREPARE RBC (CROSSMATCH)

## 2015-04-25 MED ORDER — IRBESARTAN 75 MG PO TABS
37.5000 mg | ORAL_TABLET | Freq: Every day | ORAL | Status: DC
  Administered 2015-04-25 – 2015-04-28 (×4): 37.5 mg via ORAL
  Filled 2015-04-25 (×4): qty 0.5

## 2015-04-25 MED ORDER — SODIUM CHLORIDE 0.9 % IV SOLN
Freq: Once | INTRAVENOUS | Status: AC
  Administered 2015-04-25: 09:00:00 via INTRAVENOUS

## 2015-04-25 NOTE — Progress Notes (Signed)
    Subjective: 1 Day Post-Op Procedure(s) (LRB):  TOTAL HIP ARTHROPLASTY AND REMOVAL OF IM AFFIXUS NAIL  (Left) Patient reports pain as 5 on 0-10 scale.   Denies CP or SOB.  Voiding without difficulty. Positive flatus. Objective: Vital signs in last 24 hours: Temp:  [97.7 F (36.5 C)-99.5 F (37.5 C)] 98.8 F (37.1 C) (04/30 0607) Pulse Rate:  [89-124] 94 (04/30 0607) Resp:  [14-22] 18 (04/30 0607) BP: (95-174)/(39-79) 97/39 mmHg (04/30 0607) SpO2:  [96 %-100 %] 100 % (04/30 0607)  Intake/Output from previous day: 04/29 0701 - 04/30 0700 In: 4981.3 [I.V.:4481.3; IV Piggyback:500] Out: 960 [Urine:460; Blood:500] Intake/Output this shift:    Labs:  Recent Labs  04/24/15 1447 04/25/15 0541  HGB 9.6* 7.4*    Recent Labs  04/24/15 1447 04/25/15 0541  WBC 21.4* 14.4*  RBC 3.51* 2.68*  HCT 29.9* 22.8*  PLT 303 257    Recent Labs  04/22/15 1219 04/22/15 1518 04/24/15 1447 04/25/15 0541  NA 137  --   --  135  K 3.4*  --   --  4.5  CL 104  --   --  103  CO2 26  --   --  26  BUN 18  --   --  15  CREATININE 0.64  --  0.70 0.78  GLUCOSE 146*  --   --  172*  CALCIUM 9.1 9.2  --  8.5   No results for input(s): LABPT, INR in the last 72 hours.  Physical Exam: ABD soft Neurovascular intact Intact pulses distally Dorsiflexion/Plantar flexion intact Incision: dressing C/D/I Compartment soft  Assessment/Plan: 1 Day Post-Op Procedure(s) (LRB):  TOTAL HIP ARTHROPLASTY AND REMOVAL OF IM AFFIXUS NAIL  (Left) Advance diet Up with therapy D/C IV fluids  HCT decreased - recommendation for transfusion per medical service WBC decreasing - will monitor  [name] D for Dr. Melina Schools [facility] [phone] 04/25/2015, 9:41 AM

## 2015-04-25 NOTE — Progress Notes (Signed)
PROGRESS NOTE  [name] RXV:[id number] DOB: [birth year] DOA: 04/22/2015 PCP: Jerlyn Ly, MD  Assessment/Plan: Closed hip fracture -s/p surgery 4/29 -ortho PT- 50% WB   HTN  -continue home meds with hold parameters  ABLA -some dilutional component -transfuse 1 unit as patient symptomatic   Hypothyroidism -Continue Synthroid   Leg edema, left -duplex negative   Code Status: full Family Communication: patient Disposition Plan:    Consultants:  ortho  Procedures: Conversion of failed left hip surgery to left total hip replacement    HPI/Subjective: Episodes of pain Feeling fatigued  Objective: Filed Vitals:   04/25/15 0607  BP: 97/39  Pulse: 94  Temp: 98.8 F (37.1 C)  Resp: 18    Intake/Output Summary (Last 24 hours) at 04/25/15 0811 Last data filed at 04/25/15 0700  Gross per 24 hour  Intake 4981.25 ml  Output    960 ml  Net 4021.25 ml   Filed Weights   04/23/15 0734  Weight: 87.181 kg (192 lb 3.2 oz)    Exam:   General:  resting  Cardiovascular: rrr  Respiratory: clear  Abdomen: +BS, soft  Musculoskeletal: min edema, ice pack in ankle  Data Reviewed: Basic Metabolic Panel:  Recent Labs Lab 04/22/15 0709 04/22/15 1219 04/22/15 1518 04/24/15 1447 04/25/15 0541  NA 134* 137  --   --  135  K 3.6 3.4*  --   --  4.5  CL 102 104  --   --  103  CO2 24 26  --   --  26  GLUCOSE 147* 146*  --   --  172*  BUN 22 18  --   --  15  CREATININE 0.67 0.64  --  0.70 0.78  CALCIUM 9.2 9.1 9.2  --  8.5   Liver Function Tests:  Recent Labs Lab 04/22/15 0709 04/22/15 1219 04/22/15 1518  AST 16 18  --   ALT 21 19  --   ALKPHOS 90 86  --   BILITOT 0.5 0.6  --   PROT 6.7 6.1  --   ALBUMIN 3.7 3.4* 3.6   No results for input(s): LIPASE, AMYLASE in the last 168 hours. No results for input(s): AMMONIA in the last 168 hours. CBC:  Recent Labs Lab 04/22/15 0709 04/24/15 1447 04/25/15 0541  WBC 10.2 21.4* 14.4*    NEUTROABS 7.5  --   --   HGB 12.3 9.6* 7.4*  HCT 37.2 29.9* 22.8*  MCV 83.4 85.2 85.1  PLT 246 303 257   Cardiac Enzymes: No results for input(s): CKTOTAL, CKMB, CKMBINDEX, TROPONINI in the last 168 hours. BNP (last 3 results) No results for input(s): BNP in the last 8760 hours.  ProBNP (last 3 results) No results for input(s): PROBNP in the last 8760 hours.  CBG: No results for input(s): GLUCAP in the last 168 hours.  Recent Results (from the past 240 hour(s))  Urine culture     Status: None   Collection Time: 04/22/15  8:20 AM  Result Value Ref Range Status   Specimen Description URINE, CATHETERIZED  Final   Special Requests ADDED [id number]  Final   Colony Count NO GROWTH Performed at [facility]   Final   Culture NO GROWTH Performed at [facility]   Final   Report Status 04/23/2015 FINAL  Final  Surgical PCR screen     Status: None   Collection Time: 04/23/15  3:06 PM  Result Value Ref Range Status   MRSA, PCR NEGATIVE NEGATIVE  Final   Staphylococcus aureus NEGATIVE NEGATIVE Final    Comment:        The Xpert SA Assay (FDA approved for NASAL specimens in patients over 71 years of age), is one component of a comprehensive surveillance program.  Test performance has been validated by [facility] for patients greater than or equal to 48 year old. It is not intended to diagnose infection nor to guide or monitor treatment.      Studies: Dg Hip Port Unilat With Pelvis 1v Left  04/24/2015   CLINICAL DATA:  Status post hip replacement.  EXAM: LEFT HIP (WITH PELVIS) 1 VIEW PORTABLE  COMPARISON:  04/22/2015  FINDINGS: Removed intra medullary nail, now with total arthroplasty that is reinforced with cerclage and claw plate. There is no evidence of periprosthetic fracture or dislocation.  IMPRESSION: Conversion to total left hip arthroplasty.  No acute findings.   Electronically Signed   By: Monte Fantasia M.D.   On: 04/24/2015 13:29    Scheduled  Meds: . sodium chloride   Intravenous Once  . sodium chloride   Intravenous Once  . docusate sodium  100 mg Oral BID  . enoxaparin (LOVENOX) injection  40 mg Subcutaneous Q24H  . ferrous sulfate  325 mg Oral TID PC  . irbesartan  37.5 mg Oral Daily  . levothyroxine  88 mcg Oral QAC breakfast  . Vitamin D (Ergocalciferol)  50,000 Units Oral Once per day on Mon Wed Fri   Continuous Infusions:   Antibiotics Given (last 72 hours)    Date/Time Action Medication Dose Rate   04/24/15 0757 Given   ceFAZolin (ANCEF) IVPB 2 g/50 mL premix 2 g    04/24/15 1455 Given   ceFAZolin (ANCEF) IVPB 2 g/50 mL premix 2 g 100 mL/hr   04/24/15 2031 Given   ceFAZolin (ANCEF) IVPB 2 g/50 mL premix 2 g 100 mL/hr      Active Problems:   HTN (hypertension)   Hypothyroidism   Closed left hip fracture   Closed hip fracture   Leg edema, left    Time spent: 25 min    VANN, JESSICA  [facility] Pager [phone] If 7PM-7AM, please contact night-coverage at [email or web address], password [facility] 04/25/2015, 8:11 AM  LOS: 3 days

## 2015-04-25 NOTE — Op Note (Signed)
NAMEVOLLIE, [name] NO.:  [id number]  MEDICAL RECORD NO.:  [id number]  LOCATION:  5W19C                        FACILITY:  [facility]  PHYSICIAN:  Pietro Cassis. Alvan Dame, M.D.  DATE OF BIRTH:  [birth year]  DATE OF PROCEDURE: DATE OF DISCHARGE:                              OPERATIVE REPORT   PREOPERATIVE DIAGNOSIS:  Failed left hip surgery for prior open reduction and internal fixation of a comminuted left proximal femur fracture.  POSTOPERATIVE DIAGNOSIS:  Failed left hip surgery for prior open reduction and internal fixation of a comminuted left proximal femur fracture.  PROCEDURES: 1. Conversion of failed left hip surgery to a left total hip     arthroplasty.  Components used a Biomet hip system with a size 50-     mm G7 cup, a 36-mm E1 neutral liner.  The femoral size was an Arcos     hip system with a size 15 x 150-mm STS distal stem with a size B 60-     mm proximal cone body.  We used a 36 +0 ceramic head with a     titanium sleeve and a large trochanteric claw for reapproximation     of the greater trochanter with a 34-mm trochanteric bolt. 2. Repair of the greater trochanteric fracture.  SURGEON:  Pietro Cassis. Alvan Dame, M.D.  ASSISTANT:  Danae Orleans, PA-C as well as Tyrone Sage, RNFA.  ANESTHESIA:  General.  SPECIMENS:  None.  COMPLICATIONS:  None.  DRAINS:  None.  BLOOD LOSS:  About 500 mL.  INDICATIONS FOR PROCEDURE:  [name] is a 64 year old female with history of a ground level fall, sustaining significant proximal femur fracture that was comminuted.  She underwent a long trochanteric IM nail, she tried to fix this in December of 2015 by Dr. Netta Cedars. She had been followed in the office and noted increasing pain recently. It was identified that the proximal portion of the nail had fractured, indicating nonunion of the proximal femur.  She was admitted to the hospital due to increasing pain and inability to function.  I was asked to assist  in definitive management.  Risks, benefits, necessity of conversion total hip arthroplasty were discussed and reviewed.  Consent was obtained.  Risks did include the risk of infection, DVT, neurovascular injury.  Discussed the potential failure of implant and need for future revision surgery as opposed to other options available, which were limited.  PROCEDURE IN DETAIL:  The patient was brought to the operative theater. Once adequate anesthesia, preoperative antibiotics included vancomycin and Ancef, she was positioned into the right lateral decubitus position with the left side up.  The left lower extremity was then prepped and draped in sterile fashion.  Time-out was performed identifying the patient, planned procedure, and extremity.  The leg was exposed down to the knee to remove two distal interlocks.  Following the time-out, an incision was made over the posterior aspect of the trochanter.  Soft tissue dissection was carried down to the iliotibial band and gluteal fascia.  This was incised for posterior approach to the hip.  Given the typical findings of a proximal femur fracture, the greater trochanter was noted to be malunited and rotated  posteriorly.  Following some initial exposure to identify bony landmarks, I did identify the lag screws, which were removed out of the femoral neck and head easily.  I then spent time exposing the posterior aspect of the hip.  This required osteotomize in some of the malunited bone off the posterior aspect of the trochanter to allow for mobilization of the trochanter.  Once the trochanter was better mobilized, I was able to remove the femoral head and neck, which was noted to be nonunited to the trochanteric segment or shaft of the femur.  Once this was removed, further debridement of the trochanter was carried out to make certain that it would be easier to mobilize as well as to be replaced over the top of the stem as it was to be  replaced.  During this procedure, I did identify the proximal portion of the nail, which had broken off and this was removed easily.  I was able to retract the greater trochanteric portion of the fracture anteriorly to allow for exposure of the proximal nail, which was identified.  With this exposed, we then made an incision distally, at which point, I was able to remove the two distal interlocking screws without difficulty.  At this point, the nail was grasped proximally and removed again without issue.  At this point, I did start to work on the femoral side just based on exposure.  We decided to use the [facility] modular system with a distal stem and proximal cone.  I initially reamed with these conical reamers, but then elected to ream with a standard reamer set to allow to remove the fibrous tissue within the canal of the femur.  I ended up reaming to about 13.5-mm reamer and using the ball-tip guidewire on the portion to get a 13.5-mm reamer down.  This adequately debrided the canal of the femur.  Following this, I reamed up to a 15-mm conical reamer for this Arcos system.  At this point, I placed a trial of 15 x 150-mm stem into the femoral shaft.  With the stem in place, I now attended to the acetabulum.  Acetabular exposure hardly been obtained during the exposure of the posterior aspect of the hip.  With the retractors placed, I began reaming with a 45-mm reamer and up to 49-mm reamer and used a 50-mm G7 cup, it was impacted down at approximately 35-40 degrees of abduction and 20 degrees of forward flexion with a portion of the anterior wall identified.  I placed two cancellous screws into the ilium and then placed a trial liner.  At this point, we finished the preparation of the proximal femur by reaming the proximal exposed bone from size 8 to a size B for proximal cone body.  Based on the reamings and identified orientation of the greater trochanteric segment, I trialed first  a 70-mm body, which I found was too long and then with a 60-mm body.  The body was attached to the proximal stem utilizing the torque wrench with the neck about 20-25 degrees.  Ultimately, the trial reduction was carried out and identified that the 36+ 0 ball with the current hip system for rather a stable hip with about 45-50 degrees of combined anteversion and no evidence any impingement or subluxation.  Given all these trials, all the trial components were removed.  Final components were opened on the back table.  Following placing the hole eliminator into the acetabular shell, I impacted a 36 neutral E1 liner. This was impacted  with good visualized rim fit.  At this point, attention was directed to the femur.  The size 15 x 150- mm STS distal stem was then impacted without further evidence of any migration with good fixation.  I did retrial again between the 60 and 70-mm proximal cone body to make certain that I was satisfied with the 60 mm.  At this point, once I had done this, the 60-mm proximal cone body was opened and then oriented it again 20-25 degrees of anteversion first impacted into the stem and then using the bolt and torque limiting wrench, tightened down.  At this point, I placed a 36- 3 ball and reduced the hip.  We did this to reapproximate the greater trochanter onto the stem using a large trochanteric claw designed for this Arcos hip system.  We reduced the mobilized greater trochanter to the orientation of the femur where I liked it, placed a large trochanteric claw.  This large claw was opened and using the guide to help with the insertion, it was tight into the proximal aspect of the stem.  The bone was prepared, drilled and then the large trochanteric claw was tightened down to the stem with the torque limiting wrench.  At this point, I dislocated the trial head and selected a 36 ceramic head with a +0 titanium sleeve, impacted this onto the clean and  dry trunnion and then reduced the hip.  I did then place a single Zimmer Cobalt Chrome cable around the distal aspect of the stem to provide 2 points of fixation to prevent any migration or movement of the claw.  This concluded the procedure.  The hip was irrigated throughout the case again at this point.  I reapproximated the iliotibial band and gluteal fascia using #1 Vicryl as well as 0 V-Loc sutures.  The remainder of the wound was closed with 2-0 Vicryl and then a running 3-0 Monocryl.  The hip was cleaned, dried and dressed sterilely using Dermabond and Aquacel dressing.  The distal wound was closed with 2-0 Vicryl and Dermabond and a Mepilex dressing.  Following this, the patient was extubated, brought to the recovery room in stable condition, tolerating the procedure well.  Findings were reviewed with the family.  We will have her be partial weightbearing for probably 4-6 weeks to allow for bony ingrowth and prevent further complications or concerns.  Posterior hip precautions will be applied.     Pietro Cassis Alvan Dame, M.D.     MDO/MEDQ  D:  04/24/2015  T:  04/25/2015  Job:  [id number]

## 2015-04-25 NOTE — Evaluation (Signed)
Physical Therapy Evaluation Patient Details Name: [name] MRN: [id number] DOB: Jul 25, 1951 Today's Date: 04/25/2015   History of Present Illness  TOTAL HIP ARTHROPLASTY AND REMOVAL OF IM AFFIXUS NAIL (Left) due to failure of he nail. 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 Pt will benefit from skilled PT to increase their independence and safety with mobility to allow safe discharge.  Attempted to see pts several times today and first time pt had 10/10 pain and had just received meds. The second time they were just about to hang blood.  Third attempt blood was running and was being administered through IV in the wrist which I deemed to be too risky to attempt mobilization. Will attempt to get up tomorrow and after that PT will be able to make DC recommendations and set goals.  Pt and husband understood why mobilization was not attempted and in agreement.    Follow Up Recommendations Other (comment) (TBD after next PT session)    Equipment Recommendations  None recommended by PT    Recommendations for Other Services OT consult     Precautions / Restrictions Precautions Precautions: Posterior Hip Precaution Booklet Issued: Yes (comment) Precaution Comments: Will attempt to verify withsurgeon that pt has posterior hip precautions Restrictions Weight Bearing Restrictions: Yes LLE Weight Bearing: Partial weight bearing LLE Partial Weight Bearing Percentage or Pounds: 50      Mobility  Bed Mobility Overal bed mobility:  (See comments under assessment)                Transfers                    Ambulation/Gait                Stairs            Wheelchair Mobility    Modified Rankin (Stroke Patients Only)       Balance                                             Pertinent Vitals/Pain Pain  Assessment: 0-10 Pain Score: 10-Worst pain ever Pain Location: left hip Pain Intervention(s): Limited activity within patient's tolerance;Monitored during session;Premedicated before session    Home Living Family/patient expects to be discharged to:: Private residence Living Arrangements: Spouse/significant other Available Help at Discharge: Family;Available PRN/intermittently Type of Home: House Home Access: Stairs to enter Entrance Stairs-Rails: Psychiatric nurse of Steps: 3 Home Layout: One level Home Equipment: Walker - 2 wheels;Cane - single point;Bedside commode;Shower seat      Prior Function Level of Independence: Independent with assistive device(s)               Hand Dominance   Dominant Hand: Right    Extremity/Trunk Assessment   Upper Extremity Assessment: Defer to OT evaluation           Lower Extremity Assessment: LLE deficits/detail   LLE Deficits / Details: ROM and strength greatly limited in LLE     Communication   Communication: No difficulties  Cognition Arousal/Alertness: Awake/alert Behavior During Therapy: WFL for tasks assessed/performed Overall Cognitive Status: Within Functional Limits for tasks assessed  General Comments      Exercises Total Joint Exercises Ankle Circles/Pumps: AROM;Both;10 reps;Supine Quad Sets: AROM;Left;10 reps;Supine Heel Slides: AAROM;Left;10 reps;Supine Hip ABduction/ADduction: AAROM;Left;10 reps;Supine      Assessment/Plan    PT Assessment Patient needs continued PT services  PT Diagnosis Acute pain;Difficulty walking   PT Problem List Decreased strength;Decreased range of motion;Decreased mobility;Pain  PT Treatment Interventions DME instruction;Gait training;Stair training;Therapeutic exercise;Patient/family education   PT Goals (Current goals can be found in the Care Plan section) Acute Rehab PT Goals Patient Stated Goal: TBD after next session PT  Goal Formulation:  (Goals to be set after next session)    Frequency 7X/week   Barriers to discharge        Co-evaluation               End of Session   Activity Tolerance: Patient tolerated treatment well Patient left: in bed;with call bell/phone within reach;with nursing/sitter in room;with family/visitor present Nurse Communication:  (pt requests pain med schedule written on board)         Time: 0160-1093 PT Time Calculation (min) (ACUTE ONLY): 30 min   Charges:   PT Evaluation $Initial PT Evaluation Tier I: 1 Procedure PT Treatments $Therapeutic Exercise: 8-22 mins   PT G Codes:        [name] 04/25/2015, 4:26 PM  [name], [place] 04/25/2015

## 2015-04-26 ENCOUNTER — Inpatient Hospital Stay (HOSPITAL_COMMUNITY): Payer: 59

## 2015-04-26 DIAGNOSIS — D62 Acute posthemorrhagic anemia: Secondary | ICD-10-CM

## 2015-04-26 LAB — CBC
HEMATOCRIT: 26.4 % — AB (ref 36.0–46.0)
HEMOGLOBIN: 8.8 g/dL — AB (ref 12.0–15.0)
MCH: 27.6 pg (ref 26.0–34.0)
MCHC: 33.3 g/dL (ref 30.0–36.0)
MCV: 82.8 fL (ref 78.0–100.0)
Platelets: 272 10*3/uL (ref 150–400)
RBC: 3.19 MIL/uL — ABNORMAL LOW (ref 3.87–5.11)
RDW: 13.8 % (ref 11.5–15.5)
WBC: 16.2 10*3/uL — ABNORMAL HIGH (ref 4.0–10.5)

## 2015-04-26 LAB — BASIC METABOLIC PANEL
ANION GAP: 6 (ref 5–15)
BUN: 9 mg/dL (ref 6–20)
CO2: 28 mmol/L (ref 22–32)
CREATININE: 0.72 mg/dL (ref 0.44–1.00)
Calcium: 8.5 mg/dL — ABNORMAL LOW (ref 8.9–10.3)
Chloride: 101 mmol/L (ref 101–111)
GFR calc Af Amer: >60 mL/min (ref >60)
GFR calc non Af Amer: >60 mL/min (ref >60)
Glucose, Bld: 135 mg/dL — ABNORMAL HIGH (ref 70–99)
Potassium: 3.9 mmol/L (ref 3.5–5.1)
Sodium: 135 mmol/L (ref 135–145)

## 2015-04-26 LAB — TYPE AND SCREEN
ABO/RH(D): A POS
ANTIBODY SCREEN: NEGATIVE
Unit division: 0

## 2015-04-26 MED ORDER — ACETAMINOPHEN 325 MG PO TABS
650.0000 mg | ORAL_TABLET | Freq: Four times a day (QID) | ORAL | Status: DC | PRN
  Administered 2015-04-26: 650 mg via ORAL

## 2015-04-26 NOTE — Progress Notes (Signed)
Physical Therapy Treatment Patient Details Name: [name] MRN: [id number] DOB: [birth year] Today's Date: 04/26/2015    History of Present Illness TOTAL HIP ARTHROPLASTY AND REMOVAL OF IM AFFIXUS NAIL (Left) due to failure of the nail. 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 Husband would very much like for pt to return to CIR prior to DC home.  Pt would benefit from rehab prior to DC home.  Anticipate pt will make steady progress with mobility.  Will continue to follow and progress pt while in the acute care hospital.    Follow Up Recommendations  CIR;Supervision for mobility/OOB     Equipment Recommendations  Wheelchair (measurements PT)    Recommendations for Other Services       Precautions / Restrictions Precautions Precautions: Posterior Hip Precaution Booklet Issued: Yes (comment) Precaution Comments: Reviewed hip precautions with pt and husband Restrictions Weight Bearing Restrictions: Yes LLE Weight Bearing: Partial weight bearing LLE Partial Weight Bearing Percentage or Pounds: 50    Mobility  Bed Mobility Overal bed mobility: Needs Assistance Bed Mobility: Supine to Sit     Supine to sit: Mod assist;+2 for physical assistance     General bed mobility comments: Verbal and physical cues for safest technique  Transfers Overall transfer level: Needs assistance Equipment used: Rolling walker (2 wheeled) Transfers: Sit to/from Stand Sit to Stand: Mod assist;+2 physical assistance         General transfer comment: Cues for technique  Ambulation/Gait Ambulation/Gait assistance: Mod assist;+2 safety/equipment Ambulation Distance (Feet): 6 Feet Assistive device: Rolling walker (2 wheeled) Gait Pattern/deviations: Step-to pattern;Decreased stride length;Antalgic Gait velocity: decreased Gait velocity interpretation: Below normal speed for age/gender General Gait Details:  pt accepting very little weight through LLE despite cues that it was ok to WB 50%   Stairs            Wheelchair Mobility    Modified Rankin (Stroke Patients Only)       Balance Overall balance assessment: Needs assistance Sitting-balance support: Bilateral upper extremity supported         Standing balance-Leahy Scale: Poor Standing balance comment: requires Both arms to balance using RW                    Cognition Arousal/Alertness: Awake/alert Behavior During Therapy: WFL for tasks assessed/performed Overall Cognitive Status: Within Functional Limits for tasks assessed                      Exercises Total Joint Exercises Ankle Circles/Pumps: AROM;Both;10 reps;Supine Hip ABduction/ADduction: AAROM;Left;10 reps;Supine Long Arc Quad: AROM;Both;10 reps;Seated    General Comments General comments (skin integrity, edema, etc.): husband present throughout [place] and had many questions regarding DC planning and general progression      Pertinent Vitals/Pain Pain Assessment: 0-10 Pain Score: 6  Pain Location: left hip Pain Intervention(s): Limited activity within patient's tolerance;Monitored during session;Premedicated before session    Home Living                      Prior Function            PT Goals (current goals can now be found in the care plan section) Acute Rehab PT Goals Patient Stated Goal: To be able to return home PT Goal Formulation: With patient Progress towards PT goals: Progressing toward goals (Goals set today )  Frequency       PT Plan Discharge plan needs to be updated    Co-evaluation             End of Session Equipment Utilized During Treatment: Gait belt Activity Tolerance: Patient tolerated treatment well Patient left: in chair;with call bell/phone within reach;with family/visitor present     Time: 4982-6415 PT Time Calculation (min) (ACUTE ONLY): 42 min  Charges:  $Gait Training:  8-22 mins $Therapeutic Exercise: 8-22 mins $Self Care/Home Management: 8-22                    G Codes:      [name] 2015/04/30, 3:07 PM  [name], [place] Apr 30, 2015

## 2015-04-26 NOTE — Progress Notes (Signed)
Patient ID: [name], female   DOB: 09-12-1951, 64 y.o.   MRN: [id number] Subjective: 2 Days Post-Op Procedure(s) (LRB):  TOTAL HIP ARTHROPLASTY AND REMOVAL OF IM AFFIXUS NAIL  (Left)    Patient reports pain as mild out of bed on bedside commode.  Feels good sitting down No PT yesterday as transfused 1 unit PRBCs   Objective:   VITALS:   Filed Vitals:   04/26/15 0914  BP: 136/57  Pulse: 104  Temp: 98.9 F (37.2 C)  Resp: 15    Neurovascular intact Incision: dressing C/D/I  LABS  Recent Labs  04/24/15 1447 04/25/15 0541 04/26/15 0642  HGB 9.6* 7.4* 8.8*  HCT 29.9* 22.8* 26.4*  WBC 21.4* 14.4* 16.2*  PLT 303 257 272     Recent Labs  04/24/15 1447 04/25/15 0541 04/26/15 0642  NA  --  135 135  K  --  4.5 3.9  BUN  --  15 9  CREATININE 0.70 0.78 0.72  GLUCOSE  --  172* 135*    No results for input(s): LABPT, INR in the last 72 hours.   Assessment/Plan: 2 Days Post-Op Procedure(s) (LRB):  TOTAL HIP ARTHROPLASTY AND REMOVAL OF IM AFFIXUS NAIL  (Left)   Up with therapy Discharge to SNF versus acute rehab Husband waiting to speak with social work and medical team about insurance related disposition concerns PWB LLE Will follow up with me in 2 weeks for wound check and progress status

## 2015-04-26 NOTE — Progress Notes (Signed)
Spoke with nurse regarding fever.  WIll get U/A, x ray and blood cultures.  Tylenol ordered.  Will hold on abx until we know what is treated or patient becomes unstable  Eulogio Bear

## 2015-04-26 NOTE — Progress Notes (Signed)
PROGRESS NOTE  [name] VOH:[id number] DOB: March 13, 1951 DOA: 04/22/2015 PCP: Jerlyn Ly, MD  This is a 64 year old patient referred to the hospitalist service for admission by Dr. Colin Rhein in the ER. 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 According to the patient she initially had a surgical procedure to repair a left hip fracture on 12/15/14 by Dr. Veverly Fells. Her rehabilitative course had been unremarkable and up until recently she had been able to ambulate without difficulty with a cane. She had revision surgery on 4/29.   Assessment/Plan: Closed hip fracture -s/p surgery 4/29 -ortho PT- 50% WB   HTN  -continue home meds with hold parameters  ABLA -some dilutional component -transfused 1 unit as patient symptomatic on 4/30   Hypothyroidism -Continue Synthroid   Leg edema, left -duplex negative  Leukocytosis -monitor -incentive spirometry -urine clean on admission  Code Status: full Family Communication: patient Disposition Plan:    Consultants:  ortho  Procedures: Conversion of failed left hip surgery to left total hip replacement    HPI/Subjective: Less fatigue today, still with pain  Objective: Filed Vitals:   04/26/15 0914  BP: 136/57  Pulse: 104  Temp: 98.9 F (37.2 C)  Resp: 15    Intake/Output Summary (Last 24 hours) at 04/26/15 0942 Last data filed at 04/25/15 2352  Gross per 24 hour  Intake 1040.67 ml  Output   1100 ml  Net -59.33 ml   Filed Weights   04/23/15 0734  Weight: 87.181 kg (192 lb 3.2 oz)    Exam:   General:  A+Ox3, NAD  Cardiovascular: rrr  Respiratory: clear  Abdomen: +BS, soft  Musculoskeletal: pulses intact  Data Reviewed: Basic Metabolic Panel:  Recent Labs Lab 04/22/15 0709 04/22/15 1219 04/22/15 1518 04/24/15 1447 04/25/15 0541 04/26/15 0642  NA 134* 137  --   --  135  135  K 3.6 3.4*  --   --  4.5 3.9  CL 102 104  --   --  103 101  CO2 24 26  --   --  26 28  GLUCOSE 147* 146*  --   --  172* 135*  BUN 22 18  --   --  15 9  CREATININE 0.67 0.64  --  0.70 0.78 0.72  CALCIUM 9.2 9.1 9.2  --  8.5 8.5*   Liver Function Tests:  Recent Labs Lab 04/22/15 0709 04/22/15 1219 04/22/15 1518  AST 16 18  --   ALT 21 19  --   ALKPHOS 90 86  --   BILITOT 0.5 0.6  --   PROT 6.7 6.1  --   ALBUMIN 3.7 3.4* 3.6   No results for input(s): LIPASE, AMYLASE in the last 168 hours. No results for input(s): AMMONIA in the last 168 hours. CBC:  Recent Labs Lab 04/22/15 0709 04/24/15 1447 04/25/15 0541 04/26/15 0642  WBC 10.2 21.4* 14.4* 16.2*  NEUTROABS 7.5  --   --   --   HGB 12.3 9.6* 7.4* 8.8*  HCT 37.2 29.9* 22.8* 26.4*  MCV 83.4 85.2 85.1 82.8  PLT 246 303 257 272   Cardiac Enzymes: No results for input(s): CKTOTAL, CKMB, CKMBINDEX, TROPONINI in the last 168 hours. BNP (last 3 results) No results for input(s): BNP in the last 8760 hours.  ProBNP (last 3 results) No results for input(s): PROBNP in the last 8760 hours.  CBG: No results for input(s): GLUCAP in the last 168 hours.  Recent Results (from the past 240 hour(s))  Urine culture     Status: None   Collection Time: 04/22/15  8:20 AM  Result Value Ref Range Status   Specimen Description URINE, CATHETERIZED  Final   Special Requests ADDED [id number]  Final   Colony Count NO GROWTH Performed at [facility]   Final   Culture NO GROWTH Performed at [facility]   Final   Report Status 04/23/2015 FINAL  Final  Surgical PCR screen     Status: None   Collection Time: 04/23/15  3:06 PM  Result Value Ref Range Status   MRSA, PCR NEGATIVE NEGATIVE Final   Staphylococcus aureus NEGATIVE NEGATIVE Final    Comment:        The Xpert SA Assay (FDA approved for NASAL specimens in patients over 7 years of age), is one component of a comprehensive surveillance program.  Test  performance has been validated by [facility] for patients greater than or equal to 39 year old. It is not intended to diagnose infection nor to guide or monitor treatment.      Studies: Dg Hip Port Unilat With Pelvis 1v Left  04/24/2015   CLINICAL DATA:  Status post hip replacement.  EXAM: LEFT HIP (WITH PELVIS) 1 VIEW PORTABLE  COMPARISON:  04/22/2015  FINDINGS: Removed intra medullary nail, now with total arthroplasty that is reinforced with cerclage and claw plate. There is no evidence of periprosthetic fracture or dislocation.  IMPRESSION: Conversion to total left hip arthroplasty.  No acute findings.   Electronically Signed   By: Monte Fantasia M.D.   On: 04/24/2015 13:29    Scheduled Meds: . sodium chloride   Intravenous Once  . docusate sodium  100 mg Oral BID  . enoxaparin (LOVENOX) injection  40 mg Subcutaneous Q24H  . ferrous sulfate  325 mg Oral TID PC  . irbesartan  37.5 mg Oral Daily  . levothyroxine  88 mcg Oral QAC breakfast  . Vitamin D (Ergocalciferol)  50,000 Units Oral Once per day on Mon Wed Fri   Continuous Infusions:   Antibiotics Given (last 72 hours)    Date/Time Action Medication Dose Rate   04/24/15 0757 Given   ceFAZolin (ANCEF) IVPB 2 g/50 mL premix 2 g    04/24/15 1455 Given   ceFAZolin (ANCEF) IVPB 2 g/50 mL premix 2 g 100 mL/hr   04/24/15 2031 Given   ceFAZolin (ANCEF) IVPB 2 g/50 mL premix 2 g 100 mL/hr      Active Problems:   HTN (hypertension)   Hypothyroidism   Closed left hip fracture   Closed hip fracture   Leg edema, left    Time spent: 25 min    Demarus Latterell  [facility] Pager [phone] If 7PM-7AM, please contact night-coverage at [email or web address], password [facility] 04/26/2015, 9:42 AM  LOS: 4 days

## 2015-04-26 NOTE — Discharge Instructions (Signed)
PWB left lower extremity Keep wound dry Maintain hip surgical dressing until follow up Lower incision keep covered for first week with dry dressing as needed

## 2015-04-27 DIAGNOSIS — D62 Acute posthemorrhagic anemia: Secondary | ICD-10-CM | POA: Diagnosis present

## 2015-04-27 DIAGNOSIS — K900 Celiac disease: Secondary | ICD-10-CM

## 2015-04-27 DIAGNOSIS — S72002S Fracture of unspecified part of neck of left femur, sequela: Secondary | ICD-10-CM

## 2015-04-27 DIAGNOSIS — N3000 Acute cystitis without hematuria: Secondary | ICD-10-CM

## 2015-04-27 DIAGNOSIS — K9041 Non-celiac gluten sensitivity: Secondary | ICD-10-CM | POA: Diagnosis present

## 2015-04-27 LAB — URINALYSIS, ROUTINE W REFLEX MICROSCOPIC
Bilirubin Urine: NEGATIVE
Glucose, UA: NEGATIVE mg/dL
HGB URINE DIPSTICK: NEGATIVE
Ketones, ur: NEGATIVE mg/dL
Nitrite: NEGATIVE
Protein, ur: NEGATIVE mg/dL
Specific Gravity, Urine: 1.019 (ref 1.005–1.030)
Urobilinogen, UA: 1 mg/dL (ref 0.0–1.0)
pH: 6.5 (ref 5.0–8.0)

## 2015-04-27 LAB — BASIC METABOLIC PANEL
Anion gap: 6 (ref 5–15)
BUN: 9 mg/dL (ref 6–20)
CO2: 27 mmol/L (ref 22–32)
CREATININE: 0.62 mg/dL (ref 0.44–1.00)
Calcium: 8.5 mg/dL — ABNORMAL LOW (ref 8.9–10.3)
Chloride: 100 mmol/L — ABNORMAL LOW (ref 101–111)
GFR calc Af Amer: >60 mL/min (ref >60)
GLUCOSE: 154 mg/dL — AB (ref 70–99)
Potassium: 3.9 mmol/L (ref 3.5–5.1)
Sodium: 133 mmol/L — ABNORMAL LOW (ref 135–145)

## 2015-04-27 LAB — CBC
HEMATOCRIT: 24.5 % — AB (ref 36.0–46.0)
HEMOGLOBIN: 8.2 g/dL — AB (ref 12.0–15.0)
MCH: 28 pg (ref 26.0–34.0)
MCHC: 33.5 g/dL (ref 30.0–36.0)
MCV: 83.6 fL (ref 78.0–100.0)
Platelets: 303 10*3/uL (ref 150–400)
RBC: 2.93 MIL/uL — AB (ref 3.87–5.11)
RDW: 14 % (ref 11.5–15.5)
WBC: 18 10*3/uL — ABNORMAL HIGH (ref 4.0–10.5)

## 2015-04-27 LAB — URINE MICROSCOPIC-ADD ON

## 2015-04-27 MED ORDER — SULFAMETHOXAZOLE-TRIMETHOPRIM 800-160 MG PO TABS
1.0000 | ORAL_TABLET | Freq: Two times a day (BID) | ORAL | Status: DC
  Administered 2015-04-27 – 2015-04-28 (×2): 1 via ORAL
  Filled 2015-04-27 (×3): qty 1

## 2015-04-27 NOTE — Progress Notes (Signed)
Rehab Admissions Coordinator Note:  Patient was screened by Audric Venn L for appropriateness for an Inpatient Acute Rehab Consult.  At this time, we are recommending Inpatient Rehab consult. We also recommend that OT evaluation be ordered as well.  Craig Ionescu L 04/27/2015, 10:24 AM  I can be reached at [phone].

## 2015-04-27 NOTE — Evaluation (Signed)
Occupational Therapy Evaluation Patient Details Name: [name] MRN: [id number] DOB: 1951-11-07 Today's Date: 04/27/2015    History of Present Illness TOTAL HIP ARTHROPLASTY AND REMOVAL OF IM AFFIXUS NAIL (Left) due to failure of the nail. 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 Pt would benefit from acute OT services to address impairments to increase level of function and safety    Follow Up Recommendations  CIR    Equipment Recommendations  None recommended by OT;Other (comment) (TBD at next venue of care)    Recommendations for Other Services       Precautions / Restrictions Precautions Precautions: Posterior Hip Precaution Comments: Reviewed hip precautions with pt and husband Restrictions Weight Bearing Restrictions: Yes LLE Weight Bearing: Partial weight bearing Other Position/Activity Restrictions: 50%      Mobility Bed Mobility               General bed mobility comments: pt up in recliner  Transfers Overall transfer level: Needs assistance Equipment used: Rolling walker (2 wheeled) Transfers: Sit to/from Stand Sit to Stand: Min assist         General transfer comment: cues for hand placement    Balance Overall balance assessment: Needs assistance Sitting-balance support: No upper extremity supported;Feet supported Sitting balance-Leahy Scale: Fair     Standing balance support: During functional activity Standing balance-Leahy Scale: Poor                              ADL Overall ADL's : Needs assistance/impaired     Grooming: Wash/dry hands;Wash/dry face;Standing;Minimal assistance   Upper Body Bathing: Supervision/ safety;Set up;Sitting   Lower Body Bathing: Moderate assistance   Upper Body Dressing : Supervision/safety;Set up;Sitting   Lower Body Dressing: Maximal  assistance;Adhering to hip precautions   Toilet Transfer: Minimal assistance;Comfort height toilet;RW;Cueing for safety Toilet Transfer Details (indicate cue type and reason): cues for hand placement Toileting- Clothing Manipulation and Hygiene: Minimal assistance;Sit to/from stand       Functional mobility during ADLs: Minimal assistance;Cueing for safety       Vision  wears glasses at all times   Perception Perception Perception Tested?: No   Praxis Praxis Praxis tested?: Not tested    Pertinent Vitals/Pain Pain Assessment: 0-10 Pain Score: 4  Pain Location: L hip Pain Descriptors / Indicators: Aching;Sore Pain Intervention(s): Monitored during session     Hand Dominance Right   Extremity/Trunk Assessment Upper Extremity Assessment Upper Extremity Assessment: Generalized weakness   Lower Extremity Assessment Lower Extremity Assessment: Defer to PT evaluation   Cervical / Trunk Assessment Cervical / Trunk Assessment: Normal   Communication Communication Communication: No difficulties   Cognition Arousal/Alertness: Awake/alert Behavior During Therapy: WFL for tasks assessed/performed Overall Cognitive Status: Within Functional Limits for tasks assessed                     General Comments   pt pleasant and cooperative                 Home Living Family/patient expects to be discharged to:: Private residence Living Arrangements: Spouse/significant other Available Help at Discharge: Family;Available PRN/intermittently Type of Home: House Home Access: Stairs to enter [employer] of Steps: 3 Entrance Stairs-Rails: Right;Left Home Layout: One level     Bathroom Shower/Tub: Tub/shower unit  Bathroom Toilet: Standard Bathroom Accessibility: Yes   Home Equipment: Walker - 2 wheels;Cane - single point;Bedside commode;Shower seat;Tub bench          Prior Functioning/Environment Level of Independence: Independent with assistive  device(s)             OT Diagnosis: Generalized weakness;Acute pain   OT Problem List: Decreased strength;Decreased knowledge of use of DME or AE;Decreased activity tolerance;Impaired balance (sitting and/or standing);Pain   OT Treatment/Interventions: Self-care/ADL training;Patient/family education;Therapeutic activities;DME and/or AE instruction    OT Goals(Current goals can be found in the care plan section) Acute Rehab OT Goals Patient Stated Goal: To be able to return home OT Goal Formulation: With patient/family Time For Goal Achievement: 05/04/15 Potential to Achieve Goals: Good ADL Goals Pt Will Perform Grooming: with min guard assist;with supervision;with set-up Pt Will Perform Lower Body Bathing: with min assist;sitting/lateral leans;sit to/from stand;with adaptive equipment Pt Will Perform Lower Body Dressing: with mod assist;with min assist;sitting/lateral leans;sit to/from stand;with adaptive equipment Pt Will Transfer to Toilet: with min guard assist;ambulating;grab bars (3 in 1 over toilet) Pt Will Perform Toileting - Clothing Manipulation and hygiene: with min guard assist;sit to/from stand  OT Frequency: Min 2X/week   Barriers to D/C: Decreased caregiver support  pt/family would like CIR or SNF rehab                     End of Session Equipment Utilized During Treatment: Rolling walker  Activity Tolerance: Patient tolerated treatment well Patient left: in chair;with nursing/sitter in room;with family/visitor present (in chair seated at sink with nurse tech)   Time: 9842-1031 OT Time Calculation (min): 28 min Charges:  OT General Charges $OT Visit: 1 Procedure OT Evaluation $Initial OT Evaluation Tier I: 1 Procedure OT Treatments $Therapeutic Activity: 8-22 mins G-Codes:    Britt Bottom 04/27/2015, 2:33 PM

## 2015-04-27 NOTE — Progress Notes (Signed)
PT Note  Pt continues to make progress with PT. Continue to recommend CIR for ongoing Physical Therapy.  Need clarification regarding hip precautions for pt.    04/27/15 1442  PT Visit Information  Last PT Received On 04/27/15  Assistance Needed +1  History of Present Illness TOTAL HIP ARTHROPLASTY AND REMOVAL OF IM AFFIXUS NAIL (Left) due to failure of the nail. 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, HTN, carpal tunnel release  PT Time Calculation  PT Start Time (ACUTE ONLY) 1150  PT Stop Time (ACUTE ONLY) 1219  PT Time Calculation (min) (ACUTE ONLY) 29 min  Subjective Data  Patient Stated Goal To be able to return home  Precautions  Precautions Posterior Hip  Precaution Booklet Issued Yes (comment)  Precaution Comments Reviewed hip precautions with pt and husband  Restrictions  Weight Bearing Restrictions Yes  LLE Weight Bearing PWB  LLE Partial Weight Bearing Percentage or Pounds 50  Pain Assessment  Pain Assessment 0-10  Pain Score (Pt stated it just aches and hard to assign a number to it)  Pain Location l hip  Pain Descriptors / Indicators Aching  Pain Intervention(s) Limited activity within patient's tolerance;Monitored during session  Cognition  Arousal/Alertness Awake/alert  Behavior During Therapy [facility] for tasks assessed/performed  Overall Cognitive Status Within Functional Limits for tasks assessed  Bed Mobility  Overal bed mobility (NT, pt up in chair when PT arrived)  Transfers  Overall transfer level Needs assistance  Equipment used Rolling walker (2 wheeled)  Transfers Sit to/from Stand  Sit to Stand Min assist  General transfer comment Cues for technique-hand placement and to place LLE in front  Ambulation/Gait  Ambulation/Gait assistance Min assist  Ambulation Distance (Feet) 20 Feet  Assistive device Rolling walker (2 wheeled)  General Gait Details pt accepting more weight through LLE today, Needs cues for sequencing  Gait Pattern/deviations Step-to  pattern;Decreased stride length;Antalgic  Gait velocity decreased  Gait velocity interpretation Below normal speed for age/gender  General Comments  General comments (skin integrity, edema, etc.) Husband present throughout session. Educated and discussed hip precations.   Exercises  Exercises Total Joint  Total Joint Exercises  Ankle Circles/Pumps AROM;Both;10 reps;Seated  Quad Sets AROM;Left;10 reps;Supine  Hip ABduction/ADduction AAROM;Left;10 reps;Supine  Long Arc Quad AROM;Both;10 reps;Seated  PT - End of Session  Equipment Utilized During Treatment Gait belt  Activity Tolerance Patient tolerated treatment well  Patient left in chair;with call bell/phone within reach;with family/visitor present;with chair alarm set  Nurse Communication Mobility status  PT - Assessment/Plan  PT Plan Current plan remains appropriate  Follow Up Recommendations CIR;Supervision for mobility/OOB  PT equipment Wheelchair (measurements PT)  PT Goal Progression  Progress towards PT goals Progressing toward goals  PT General Charges  $$ ACUTE PT VISIT 1 Procedure  PT Treatments  $Gait Training 8-22 mins  $Therapeutic Exercise 8-22 mins     [place], [place]  [phone] 04/27/2015

## 2015-04-27 NOTE — Clinical Social Work Note (Signed)
Clinical Social Work Assessment  Patient Details  Name: [name] MRN: [id number] Date of Birth: 1951-05-17  Date of referral:  04/27/15               Reason for consult:  Discharge Planning                Permission sought to share information with:  Chartered certified accountant granted to share information::  Yes, Verbal Permission Granted  Name::     Husband [name]::     Relationship::     Contact Information:     Housing/Transportation Living arrangements for the past 2 months:  [place] of Information:  Patient, Spouse Patient Interpreter Needed:  None Criminal Activity/Legal Involvement Pertinent to Current Situation/Hospitalization:  No - Comment as needed Significant Relationships:  Spouse Lives with:  Spouse Do you feel safe going back to the place where you live?  Yes Need for family participation in patient care:  No (Coment)  Care giving concerns:  Patient and patient's husband feel that best option would be for patient to go to a facility (CIR vs SNF) for rehab at discharge as they do not feel the patient can manage at home right now.   Social Worker assessment / plan:  CSW met with patient and husband at bedside. Patient and husband agreeable to SNF as alternative to CIR. Patient and husband state that their insurance does not have SNF benefits so SNF will require private pay. This is why they are hoping for admission to CIR. Patient and patient's husband agreeable to private pay at [facility] for a short period of time if necessary. CSW explained SNF search/placement process and answered questions. CSW will assist.  Employment status:  Retired Forensic scientist:  Managed Care PT Recommendations:  Inpatient [place], [place] / Referral to community resources:  Acute Rehab, [place]  Patient/Family's Response to care:  Patient and husband are hopeful for admission to [facility], but  appreciative for CSW investigating other options for patient.  Patient/Family's Understanding of and Emotional Response to Diagnosis, Current Treatment, and Prognosis:  Both show good insight into reason for admission and are able to articulate the patient's post DC needs.   Emotional Assessment Appearance:  Appears younger than stated age Attitude/Demeanor/Rapport:  Other (Appropriate) Affect (typically observed):  Accepting, Adaptable, Appropriate, Calm, Happy Orientation:  Oriented to Self, Oriented to Place, Oriented to  Time, Oriented to Situation Alcohol / Substance use:  Alcohol Use Psych involvement (Current and /or in the community):  No (Comment)  Discharge Needs  Concerns to be addressed:  Discharge Planning Concerns Readmission within the last 30 days:  No Current discharge risk:  Physical Impairment Barriers to Discharge:  Continued Medical Work up   Fredderick Phenix B, LCSW 04/27/2015, 11:22 AM

## 2015-04-27 NOTE — Consult Note (Signed)
Physical Medicine and Rehabilitation Consult  Reason for Consult: Failed left hip pinning Referring Physician: Dr. Conley Canal   HPI: [name] is a 64 y.o. female with history of left femur fracture treated with IM Nailing 11/2014 (with  CIR for therapy) who has had increase in pain in the hip for the past few weeks with difficulty walking. 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 She was started on narcotics for pain management and underwent conversion to L-THR by Dr. Alvan Dame on 04/24/15. Post op 50% WB on LLE and SCDs for DVT prophylaxis.  Follow up labs with ABLA with drop in H/H to 7.4/26.4.  She was febrile last pm with T- 102 and was noted to have elevated WBC -18.0 due to UTI?     Review of Systems  HENT: Negative for hearing loss.   Eyes: Negative for blurred vision.  Respiratory: Negative for cough and shortness of breath.   Gastrointestinal: Negative for heartburn and nausea.  Genitourinary: Negative for urgency and frequency.  Musculoskeletal: Positive for myalgias and joint pain.  Neurological: Negative for dizziness, tingling and headaches.  Psychiatric/Behavioral: The patient does not have insomnia.       Past Medical History  Diagnosis Date  . Chickenpox   . Genital warts   . Environmental allergies   . HTN (hypertension)   . Hyperlipidemia   . Thyroid disease   . Thyroid nodule   . Hypothyroidism   . Complication of anesthesia     "hard to wake up:  Marland Kitchen Family history of adverse reaction to anesthesia     "sister & mom are hard to wake up:  . Heart murmur   . 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 Carpal tunnel release Bilateral 1990's  . Cystectomy      From the cervix  . Intramedullary (im) nail intertrochanteric Left 12/15/2014    Procedure:  INTRAMEDULLARY (IM) NAIL INTERTROCHANTRIC;  Surgeon: Augustin Schooling, MD;  Location: [facility];  Service: Orthopedics;  Laterality: Left;  . Fracture surgery      Family History  Problem Relation Age of Onset  . Arthritis Mother   . Hyperlipidemia Mother   . Heart disease Father   . Stroke Father     x's 2  . High blood pressure Father   . High blood pressure Mother   . Diabetes Mother   . Myasthenia gravis Mother     Social History: Married. Independent with cane PTA due to hip surgery.  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 She reports that she has never smoked. She has never used smokeless tobacco. She reports that she drinks alcohol on occasion.  She reports that she does not use illicit drugs.    Allergies  Allergen Reactions  . Gluten Meal   . Statins Diarrhea  . Eggs Or Egg-Derived Products Diarrhea and Rash    Medications Prior to Admission  Medication Sig Dispense Refill  . azelastine (ASTELIN) 0.1 % nasal spray Place 1 spray into both nostrils 2 (two) times daily. Use in each nostril as directed 90 mL 3  . Cyanocobalamin (VITAMIN B-12 PO) Take 1 tablet by mouth daily.    . fluticasone (FLONASE) 50 MCG/ACT nasal spray Place 2 sprays into both nostrils  daily. 48 g 3  . HYDROcodone-acetaminophen (NORCO/VICODIN) 5-325 MG per tablet Take 1-2 tablets by mouth every 6 (six) hours as needed for moderate pain. 90 tablet 0  . levothyroxine (SYNTHROID) 88 MCG tablet Take 1 tablet (88 mcg total) by mouth daily. 90 tablet 3  . valsartan (DIOVAN) 320 MG tablet TAKE 1 TABLET DAILY 90 tablet 3  . Vitamin D, Ergocalciferol, (DRISDOL) 50000 UNITS CAPS capsule Take 50,000 Units by mouth 3 (three) times a week.    Marland Kitchen acetaminophen (TYLENOL) 325 MG tablet Take 2 tablets (650 mg total) by mouth every 6 (six) hours as needed. (Patient not taking: Reported on 04/22/2015) 60 tablet 1  . methocarbamol (ROBAXIN) 750 MG tablet Take 1 tablet (750 mg total) by mouth every 6 (six) hours  as needed for muscle spasms. (Patient not taking: Reported on 04/22/2015) 90 tablet 0    Home: Home Living Family/patient expects to be discharged to:: Private residence Living Arrangements: Spouse/significant other Available Help at Discharge: Family, Available PRN/intermittently Type of Home: House Home Access: Stairs to enter Technical brewer of Steps: 3 Entrance Stairs-Rails: Right, Left Home Layout: One level Home Equipment: Environmental consultant - 2 wheels, Cane - single point, Bedside commode, Shower seat  Functional History: Prior Function Level of Independence: Independent with assistive device(s) Functional Status:  Mobility: Bed Mobility Overal bed mobility: Needs Assistance Bed Mobility: Supine to Sit Supine to sit: Mod assist, +2 for physical assistance General bed mobility comments: Verbal and physical cues for safest technique Transfers Overall transfer level: Needs assistance Equipment used: Rolling walker (2 wheeled) Transfers: Sit to/from Stand Sit to Stand: Mod assist, +2 physical assistance General transfer comment: Cues for technique Ambulation/Gait Ambulation/Gait assistance: Mod assist, +2 safety/equipment Ambulation Distance (Feet): 6 Feet Assistive device: Rolling walker (2 wheeled) General Gait Details: pt accepting very little weight through LLE despite cues that it was ok to WB 50% Gait Pattern/deviations: Step-to pattern, Decreased stride length, Antalgic Gait velocity: decreased Gait velocity interpretation: Below normal speed for age/gender    ADL:    Cognition: Cognition Overall Cognitive Status: Within Functional Limits for tasks assessed Orientation Level: Oriented X4 Cognition Arousal/Alertness: Awake/alert Behavior During Therapy: WFL for tasks assessed/performed Overall Cognitive Status: Within Functional Limits for tasks assessed  Blood pressure 139/57, pulse 106, temperature 100.8 F (38.2 C), temperature source Oral, resp. rate 17,  height 5\' 1"  (1.549 m), weight 87.181 kg (192 lb 3.2 oz), SpO2 98 %. Physical Exam  Nursing note and vitals reviewed. Constitutional: She is oriented to person, place, and time. She appears well-developed and well-nourished.  HENT:  Head: Normocephalic and atraumatic.  Eyes: Conjunctivae are normal. Pupils are equal, round, and reactive to light.  Neck: Normal range of motion. Neck supple.  Cardiovascular: Normal rate and regular rhythm.   Respiratory: Effort normal and breath sounds normal. No respiratory distress. She has no wheezes.  GI: Soft. Bowel sounds are normal. She exhibits no distension. There is no tenderness.  Musculoskeletal: She exhibits edema.  Min edema left hip. Multiple dry dressings on hip.   Neurological: She is alert and oriented to person, place, and time.  Speech clear. Follows commands without difficulty. UE's 4/5. LLE: 1/5 prox to 3/5 distally. RLE: 3/5 to 4/5 prox to distal.   Skin: Skin is warm and dry.  Psychiatric: She has a normal mood and affect. Her behavior is normal. Judgment and thought content normal.    Results for orders placed or performed during the hospital encounter of 04/22/15 (from the past 24  hour(s))  Culture, blood (routine x 2)     Status: None (Preliminary result)   Collection Time: 04/26/15  7:30 PM  Result Value Ref Range   Specimen Description BLOOD RIGHT ARM    Special Requests BOTTLES DRAWN AEROBIC AND ANAEROBIC 5CC    Culture             BLOOD CULTURE RECEIVED NO GROWTH TO DATE CULTURE WILL BE HELD FOR 5 DAYS BEFORE ISSUING A FINAL NEGATIVE REPORT Performed at [facility]    Report Status PENDING   Urinalysis, Routine w reflex microscopic     Status: Abnormal   Collection Time: 04/26/15 11:37 PM  Result Value Ref Range   Color, Urine YELLOW YELLOW   APPearance CLOUDY (A) CLEAR   Specific Gravity, Urine 1.019 1.005 - 1.030   pH 6.5 5.0 - 8.0   Glucose, UA NEGATIVE NEGATIVE mg/dL   Hgb urine dipstick NEGATIVE  NEGATIVE   Bilirubin Urine NEGATIVE NEGATIVE   Ketones, ur NEGATIVE NEGATIVE mg/dL   Protein, ur NEGATIVE NEGATIVE mg/dL   Urobilinogen, UA 1.0 0.0 - 1.0 mg/dL   Nitrite NEGATIVE NEGATIVE   Leukocytes, UA MODERATE (A) NEGATIVE  Urine microscopic-add on     Status: Abnormal   Collection Time: 04/26/15 11:37 PM  Result Value Ref Range   Squamous Epithelial / LPF MANY (A) RARE   WBC, UA TOO NUMEROUS TO COUNT <3 WBC/hpf   Bacteria, UA RARE RARE  CBC     Status: Abnormal   Collection Time: 04/27/15  5:48 AM  Result Value Ref Range   WBC 18.0 (H) 4.0 - 10.5 K/uL   RBC 2.93 (L) 3.87 - 5.11 MIL/uL   Hemoglobin 8.2 (L) 12.0 - 15.0 g/dL   HCT 24.5 (L) 36.0 - 46.0 %   MCV 83.6 78.0 - 100.0 fL   MCH 28.0 26.0 - 34.0 pg   MCHC 33.5 30.0 - 36.0 g/dL   RDW 14.0 11.5 - 15.5 %   Platelets 303 150 - 400 K/uL  Basic metabolic panel     Status: Abnormal   Collection Time: 04/27/15  5:48 AM  Result Value Ref Range   Sodium 133 (L) 135 - 145 mmol/L   Potassium 3.9 3.5 - 5.1 mmol/L   Chloride 100 (L) 101 - 111 mmol/L   CO2 27 22 - 32 mmol/L   Glucose, Bld 154 (H) 70 - 99 mg/dL   BUN 9 6 - 20 mg/dL   Creatinine, Ser 0.62 0.44 - 1.00 mg/dL   Calcium 8.5 (L) 8.9 - 10.3 mg/dL   GFR calc non Af Amer >60 >60 mL/min   GFR calc Af Amer >60 >60 mL/min   Anion gap 6 5 - 15   Dg Chest Port 1 View  04/26/2015   CLINICAL DATA:  Low-grade fever for 2 days  EXAM: PORTABLE CHEST - 1 VIEW  COMPARISON:  04/22/2015  FINDINGS: Heart size and vascular pattern normal.  No infiltrate or effusion.  Partial visualization of extensive sclerotic lesion right humeral head neck and shaft. As described on report of prior study this is consistent with an enchondroma and would require further evaluation if there is pain associated with it.  IMPRESSION: No acute findings.   Electronically Signed   By: Skipper Cliche M.D.   On: 04/26/2015 17:40    Assessment/Plan: Diagnosis: left peritrochanteric femur fx--->converted to left  THA 1. Does the need for close, 24 hr/day medical supervision in concert with the patient's rehab needs make it  unreasonable for this patient to be served in a less intensive setting? Yes 2. Co-Morbidities requiring supervision/potential complications: htn, osteoporosis, ABLA 3. Due to bladder management, bowel management, safety, skin/wound care, disease management, medication administration, pain management and patient education, does the patient require 24 hr/day rehab nursing? Yes 4. Does the patient require coordinated care of a physician, rehab nurse, PT (1-2 hrs/day, 5 days/week) and OT (1-2 hrs/day, 5 days/week) to address physical and functional deficits in the context of the above medical diagnosis(es)? Yes Addressing deficits in the following areas: balance, endurance, locomotion, strength, transferring, bowel/bladder control, bathing, dressing, feeding, grooming, toileting and psychosocial support 5. Can the patient actively participate in an intensive therapy program of at least 3 hrs of therapy per day at least 5 days per week? Yes 6. The potential for patient to make measurable gains while on inpatient rehab is excellent 7. Anticipated functional outcomes upon discharge from inpatient rehab are modified independent  with PT, modified independent with OT, n/a with SLP. 8. Estimated rehab length of stay to reach the above functional goals is: 7-11 days 9. Does the patient have adequate social supports and living environment to accommodate these discharge functional goals? Yes 10. Anticipated D/C setting: Home 11. Anticipated post D/C treatments: HH therapy and Outpatient therapy 12. Overall Rehab/Functional Prognosis: excellent  RECOMMENDATIONS: This patient's condition is appropriate for continued rehabilitative care in the following setting: CIR Patient has agreed to participate in recommended program. Yes Note that insurance prior authorization may be required for reimbursement for  recommended care.  Comment: Rehab Admissions Coordinator to follow up. Pt did very well with [place] during her last stay. Would expect the same again.   Thanks,  [name], MD, Mellody Drown     04/27/2015

## 2015-04-27 NOTE — Care Management Note (Unsigned)
    Page 1 of 1   04/27/2015     2:54:35 PM CARE MANAGEMENT NOTE 04/27/2015  Patient:  [name], [name]   Account Number:  [id number]  Date Initiated:  04/27/2015  Documentation initiated by:  Carles Collet  Subjective/Objective Assessment:   hip replacement, recieved one unit of blood two days ago, CIR following, IF SNF family prefers [facility].     Action/Plan:   will follow for needs   Anticipated DC Date:  04/29/2015   Anticipated DC Plan:  IP REHAB FACILITY  In-house referral  Clinical Social Worker      DC Planning Services  CM consult      Choice offered to / List presented to:             Status of service:  In process, will continue to follow Medicare Important Message given?  NO (If response is "NO", the following Medicare IM given date fields will be blank) Date Medicare IM given:   Medicare IM given by:   Date Additional Medicare IM given:   Additional Medicare IM given by:    Discharge Disposition:    Per UR Regulation:  Reviewed for med. necessity/level of care/duration of stay  If discussed at [place] of Stay Meetings, dates discussed:    Comments:  04-27-15 Awaiting CIR recommendation. Will follow for dc needs. Carles Collet RN BSN CM

## 2015-04-27 NOTE — Clinical Social Work Placement (Signed)
   CLINICAL SOCIAL WORK PLACEMENT  NOTE  Date:  04/27/2015  Patient Details  Name: [name] MRN: [id number] Date of Birth: 11-15-1951  Clinical Social Work is seeking post-discharge placement for this patient at the [place] level of care (*CSW will initial, date and re-position this form in  chart as items are completed):  Yes   Patient/family provided with [place] Work Department's list of facilities offering this level of care within the geographic area requested by the patient (or if unable, by the patient's family).  Yes   Patient/family informed of their freedom to choose among providers that offer the needed level of care, that participate in Medicare, Medicaid or managed care program needed by the patient, have an available bed and are willing to accept the patient.  Yes   Patient/family informed of [facility]'s ownership interest in [facility] and [facility], as well as of the fact that they are under no obligation to receive care at these facilities.  PASRR submitted to EDS on       PASRR number received on       Existing PASRR number confirmed on       FL2 transmitted to all facilities in geographic area requested by pt/family on       FL2 transmitted to all facilities within larger geographic area on 04/27/15     Patient informed that his/her managed care company has contracts with or will negotiate with certain facilities, including the following:            Patient/family informed of bed offers received.  Patient chooses bed at       Physician recommends and patient chooses bed at      Patient to be transferred to   on  .  Patient to be transferred to facility by       Patient family notified on   of transfer.  Name of family member notified:        PHYSICIAN       Additional Comment:    _______________________________________________ Rigoberto Noel, LCSW 04/27/2015, 11:40 AM

## 2015-04-27 NOTE — Progress Notes (Signed)
Utilization Review completed. Peggy Loge RN BSN CM 

## 2015-04-27 NOTE — Progress Notes (Signed)
PROGRESS NOTE  [name] GXQ:[id number] DOB: 17-Nov-1951 DOA: 04/22/2015 PCP: Jerlyn Ly, MD  This is a 64 year old patient referred to the hospitalist service for admission by Dr. Colin Rhein in the ER. 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 According to the patient she initially had a surgical procedure to repair a left hip fracture on 12/15/14 by Dr. Veverly Fells. Her rehabilitative course had been unremarkable and up until recently she had been able to ambulate without difficulty with a cane. She had revision surgery on 4/29.   Assessment/Plan: Closed hip fracture CIR consulted PT- 50% WB  UTI: bactrim. Check urine cx   HTN  -continue home meds with hold parameters  ABLA -some dilutional component -transfused 1 unit as patient symptomatic on 4/30. stable   Hypothyroidism -Continue Synthroid   Leg edema, left -duplex negative   Code Status: full Family Communication: husband Disposition Plan: CIR   Consultants:  Ortho  PM&R  Procedures: Conversion of failed left hip surgery to left total hip replacement  HPI/Subjective: C/o wrong diet. Gluten sensitive. No dysuria. Pain controlled  Objective: Filed Vitals:   04/27/15 1600  BP:   Pulse:   Temp:   Resp: 16    Intake/Output Summary (Last 24 hours) at 04/27/15 1720 Last data filed at 04/27/15 1400  Gross per 24 hour  Intake    960 ml  Output   1100 ml  Net   -140 ml   Filed Weights   04/23/15 0734  Weight: 87.181 kg (192 lb 3.2 oz)    Exam:   General:  A+Ox3, NAD  Cardiovascular: rrr without MGR  Respiratory: clear without WRR  Abdomen: +BS, soft  Musculoskeletal: no CCE  Data Reviewed: Basic Metabolic Panel:  Recent Labs Lab 04/22/15 0709 04/22/15 1219 04/22/15 1518 04/24/15 1447 04/25/15 0541 04/26/15 0642 04/27/15 0548  NA 134* 137  --   --  135 135  133*  K 3.6 3.4*  --   --  4.5 3.9 3.9  CL 102 104  --   --  103 101 100*  CO2 24 26  --   --  26 28 27   GLUCOSE 147* 146*  --   --  172* 135* 154*  BUN 22 18  --   --  15 9 9   CREATININE 0.67 0.64  --  0.70 0.78 0.72 0.62  CALCIUM 9.2 9.1 9.2  --  8.5 8.5* 8.5*   Liver Function Tests:  Recent Labs Lab 04/22/15 0709 04/22/15 1219 04/22/15 1518  AST 16 18  --   ALT 21 19  --   ALKPHOS 90 86  --   BILITOT 0.5 0.6  --   PROT 6.7 6.1  --   ALBUMIN 3.7 3.4* 3.6   No results for input(s): LIPASE, AMYLASE in the last 168 hours. No results for input(s): AMMONIA in the last 168 hours. CBC:  Recent Labs Lab 04/22/15 0709 04/24/15 1447 04/25/15 0541 04/26/15 0642 04/27/15 0548  WBC 10.2 21.4* 14.4* 16.2* 18.0*  NEUTROABS 7.5  --   --   --   --   HGB 12.3 9.6* 7.4* 8.8* 8.2*  HCT 37.2 29.9* 22.8* 26.4* 24.5*  MCV 83.4 85.2 85.1 82.8 83.6  PLT 246 303 257 272 303   Cardiac Enzymes: No results for input(s): CKTOTAL, CKMB, CKMBINDEX, TROPONINI in the last 168 hours. BNP (last 3 results)  No results for input(s): BNP in the last 8760 hours.  ProBNP (last 3 results) No results for input(s): PROBNP in the last 8760 hours.  CBG: No results for input(s): GLUCAP in the last 168 hours.  Recent Results (from the past 240 hour(s))  Urine culture     Status: None   Collection Time: 04/22/15  8:20 AM  Result Value Ref Range Status   Specimen Description URINE, CATHETERIZED  Final   Special Requests ADDED [id number]  Final   Colony Count NO GROWTH Performed at [facility]   Final   Culture NO GROWTH Performed at [facility]   Final   Report Status 04/23/2015 FINAL  Final  Surgical PCR screen     Status: None   Collection Time: 04/23/15  3:06 PM  Result Value Ref Range Status   MRSA, PCR NEGATIVE NEGATIVE Final   Staphylococcus aureus NEGATIVE NEGATIVE Final    Comment:        The Xpert SA Assay (FDA approved for NASAL specimens in patients over 21 years  of age), is one component of a comprehensive surveillance program.  Test performance has been validated by [facility] for patients greater than or equal to 52 year old. It is not intended to diagnose infection nor to guide or monitor treatment.   Culture, blood (routine x 2)     Status: None (Preliminary result)   Collection Time: 04/26/15  7:30 PM  Result Value Ref Range Status   Specimen Description BLOOD RIGHT ARM  Final   Special Requests BOTTLES DRAWN AEROBIC AND ANAEROBIC 5CC  Final   Culture   Final           BLOOD CULTURE RECEIVED NO GROWTH TO DATE CULTURE WILL BE HELD FOR 5 DAYS BEFORE ISSUING A FINAL NEGATIVE REPORT Performed at [facility]    Report Status PENDING  Incomplete     Studies: Dg Chest Port 1 View  04/26/2015   CLINICAL DATA:  Low-grade fever for 2 days  EXAM: PORTABLE CHEST - 1 VIEW  COMPARISON:  04/22/2015  FINDINGS: Heart size and vascular pattern normal.  No infiltrate or effusion.  Partial visualization of extensive sclerotic lesion right humeral head neck and shaft. As described on report of prior study this is consistent with an enchondroma and would require further evaluation if there is pain associated with it.  IMPRESSION: No acute findings.   Electronically Signed   By: Skipper Cliche M.D.   On: 04/26/2015 17:40    Scheduled Meds: . sodium chloride   Intravenous Once  . docusate sodium  100 mg Oral BID  . enoxaparin (LOVENOX) injection  40 mg Subcutaneous Q24H  . ferrous sulfate  325 mg Oral TID PC  . irbesartan  37.5 mg Oral Daily  . levothyroxine  88 mcg Oral QAC breakfast  . sulfamethoxazole-trimethoprim  1 tablet Oral Q12H  . Vitamin D (Ergocalciferol)  50,000 Units Oral Once per day on Mon Wed Fri   Continuous Infusions:   Antibiotics Given (last 72 hours)    Date/Time Action Medication Dose Rate   04/24/15 2031 Given   ceFAZolin (ANCEF) IVPB 2 g/50 mL premix 2 g 100 mL/hr     Time spent: 25 min  [place] [facility] Pager [phone] If 7PM-7AM, please contact night-coverage at [email or web address], password [facility] 04/27/2015, 5:20 PM  LOS: 5 days

## 2015-04-28 ENCOUNTER — Ambulatory Visit: Payer: 59 | Admitting: Physical Therapy

## 2015-04-28 ENCOUNTER — Encounter (HOSPITAL_COMMUNITY): Payer: Self-pay | Admitting: Orthopedic Surgery

## 2015-04-28 ENCOUNTER — Inpatient Hospital Stay (HOSPITAL_COMMUNITY)
Admission: RE | Admit: 2015-04-28 | Discharge: 2015-05-04 | DRG: 923 | Disposition: A | Discharge status: Home / Self Care | Payer: 59 | Source: Intra-hospital | Attending: Physical Medicine & Rehabilitation | Admitting: Physical Medicine & Rehabilitation

## 2015-04-28 DIAGNOSIS — M1712 Unilateral primary osteoarthritis, left knee: Secondary | ICD-10-CM | POA: Diagnosis present

## 2015-04-28 DIAGNOSIS — N3000 Acute cystitis without hematuria: Secondary | ICD-10-CM | POA: Diagnosis not present

## 2015-04-28 DIAGNOSIS — Z9660 Presence of unspecified orthopedic joint implant: Secondary | ICD-10-CM | POA: Diagnosis not present

## 2015-04-28 DIAGNOSIS — S7222XS Displaced subtrochanteric fracture of left femur, sequela: Secondary | ICD-10-CM

## 2015-04-28 DIAGNOSIS — I10 Essential (primary) hypertension: Secondary | ICD-10-CM | POA: Diagnosis present

## 2015-04-28 DIAGNOSIS — M96662 Fracture of femur following insertion of orthopedic implant, joint prosthesis, or bone plate, left leg: Secondary | ICD-10-CM | POA: Diagnosis present

## 2015-04-28 DIAGNOSIS — S7222XA Displaced subtrochanteric fracture of left femur, initial encounter for closed fracture: Secondary | ICD-10-CM | POA: Diagnosis present

## 2015-04-28 DIAGNOSIS — Z87440 Personal history of urinary (tract) infections: Secondary | ICD-10-CM | POA: Diagnosis not present

## 2015-04-28 DIAGNOSIS — Y838 Other surgical procedures as the cause of abnormal reaction of the patient, or of later complication, without mention of misadventure at the time of the procedure: Secondary | ICD-10-CM | POA: Diagnosis present

## 2015-04-28 DIAGNOSIS — D62 Acute posthemorrhagic anemia: Secondary | ICD-10-CM | POA: Diagnosis present

## 2015-04-28 DIAGNOSIS — M629 Disorder of muscle, unspecified: Secondary | ICD-10-CM | POA: Insufficient documentation

## 2015-04-28 DIAGNOSIS — N390 Urinary tract infection, site not specified: Secondary | ICD-10-CM | POA: Diagnosis present

## 2015-04-28 DIAGNOSIS — T84011S Broken internal left hip prosthesis, sequela: Principal | ICD-10-CM

## 2015-04-28 DIAGNOSIS — N342 Other urethritis: Secondary | ICD-10-CM

## 2015-04-28 DIAGNOSIS — R262 Difficulty in walking, not elsewhere classified: Secondary | ICD-10-CM | POA: Insufficient documentation

## 2015-04-28 DIAGNOSIS — M25552 Pain in left hip: Secondary | ICD-10-CM | POA: Insufficient documentation

## 2015-04-28 LAB — CBC
HCT: 24.4 % — ABNORMAL LOW (ref 36.0–46.0)
HEMOGLOBIN: 8 g/dL — AB (ref 12.0–15.0)
MCH: 27.7 pg (ref 26.0–34.0)
MCHC: 32.8 g/dL (ref 30.0–36.0)
MCV: 84.4 fL (ref 78.0–100.0)
Platelets: 406 10*3/uL — ABNORMAL HIGH (ref 150–400)
RBC: 2.89 MIL/uL — AB (ref 3.87–5.11)
RDW: 14.1 % (ref 11.5–15.5)
WBC: 13.9 10*3/uL — AB (ref 4.0–10.5)

## 2015-04-28 MED ORDER — POLYETHYLENE GLYCOL 3350 17 G PO PACK: 17.0000 g | PACK | Freq: Every day | ORAL | Status: DC | PRN

## 2015-04-28 MED ORDER — FLEET ENEMA 7-19 GM/118ML RE ENEM: 1.0000 | ENEMA | Freq: Once | RECTAL | Status: AC | PRN

## 2015-04-28 MED ORDER — GUAIFENESIN-DM 100-10 MG/5ML PO SYRP: 5.0000 mL | ORAL_SOLUTION | Freq: Four times a day (QID) | ORAL | Status: DC | PRN

## 2015-04-28 MED ORDER — ALUM & MAG HYDROXIDE-SIMETH 200-200-20 MG/5ML PO SUSP: 30.0000 mL | ORAL | Status: DC | PRN

## 2015-04-28 MED ORDER — SULFAMETHOXAZOLE-TRIMETHOPRIM 800-160 MG PO TABS: 1.0000 | ORAL_TABLET | Freq: Two times a day (BID) | ORAL | Status: DC

## 2015-04-28 MED ORDER — POLYETHYLENE GLYCOL 3350 17 G PO PACK
17.0000 g | PACK | Freq: Every day | ORAL | Status: DC | PRN
  Filled 2015-04-28 (×2): qty 1

## 2015-04-28 MED ORDER — ENOXAPARIN SODIUM 40 MG/0.4ML ~~LOC~~ SOLN
40.0000 mg | SUBCUTANEOUS | Status: DC
  Administered 2015-04-29 – 2015-05-03 (×5): 40 mg via SUBCUTANEOUS
  Filled 2015-04-28 (×7): qty 0.4

## 2015-04-28 MED ORDER — LORATADINE 10 MG PO TABS
10.0000 mg | ORAL_TABLET | Freq: Every day | ORAL | Status: DC
  Administered 2015-04-28 – 2015-05-04 (×7): 10 mg via ORAL
  Filled 2015-04-28 (×8): qty 1

## 2015-04-28 MED ORDER — FERROUS SULFATE 325 (65 FE) MG PO TABS: 325.0000 mg | ORAL_TABLET | Freq: Every day | ORAL | Status: DC

## 2015-04-28 MED ORDER — DIPHENHYDRAMINE-ZINC ACETATE 2-0.1 % EX CREA
TOPICAL_CREAM | Freq: Two times a day (BID) | CUTANEOUS | Status: DC | PRN
  Filled 2015-04-28: qty 28

## 2015-04-28 MED ORDER — ENOXAPARIN SODIUM 40 MG/0.4ML ~~LOC~~ SOLN: 40.0000 mg | SUBCUTANEOUS | Status: DC

## 2015-04-28 MED ORDER — SULFAMETHOXAZOLE-TRIMETHOPRIM 800-160 MG PO TABS
1.0000 | ORAL_TABLET | Freq: Two times a day (BID) | ORAL | Status: AC
  Administered 2015-04-28 – 2015-04-30 (×4): 1 via ORAL
  Filled 2015-04-28 (×4): qty 1

## 2015-04-28 MED ORDER — ONDANSETRON HCL 4 MG/2ML IJ SOLN
4.0000 mg | Freq: Four times a day (QID) | INTRAMUSCULAR | Status: DC | PRN
  Administered 2015-04-30: 4 mg via INTRAVENOUS
  Filled 2015-04-28: qty 2

## 2015-04-28 MED ORDER — AZELASTINE HCL 0.1 % NA SOLN
1.0000 | Freq: Two times a day (BID) | NASAL | Status: DC
  Administered 2015-04-28 – 2015-05-04 (×12): 1 via NASAL
  Filled 2015-04-28 (×2): qty 30

## 2015-04-28 MED ORDER — FAMOTIDINE 10 MG PO TABS
10.0000 mg | ORAL_TABLET | Freq: Two times a day (BID) | ORAL | Status: DC
  Administered 2015-04-28 – 2015-05-04 (×12): 10 mg via ORAL
  Filled 2015-04-28 (×14): qty 1

## 2015-04-28 MED ORDER — ONDANSETRON HCL 4 MG PO TABS
4.0000 mg | ORAL_TABLET | Freq: Four times a day (QID) | ORAL | Status: DC | PRN
  Administered 2015-04-29 – 2015-04-30 (×2): 4 mg via ORAL
  Filled 2015-04-28 (×2): qty 1

## 2015-04-28 MED ORDER — BISACODYL 10 MG RE SUPP
10.0000 mg | Freq: Every day | RECTAL | Status: DC | PRN
  Filled 2015-04-28: qty 1

## 2015-04-28 MED ORDER — IRBESARTAN 75 MG PO TABS
37.5000 mg | ORAL_TABLET | Freq: Every day | ORAL | Status: DC
  Administered 2015-04-29 – 2015-05-04 (×6): 37.5 mg via ORAL
  Filled 2015-04-28 (×7): qty 0.5

## 2015-04-28 MED ORDER — BISACODYL 10 MG RE SUPP: 10.0000 mg | Freq: Every day | RECTAL | Status: DC | PRN

## 2015-04-28 MED ORDER — PHENOL 1.4 % MT LIQD
1.0000 | OROMUCOSAL | Status: DC | PRN
  Filled 2015-04-28: qty 177

## 2015-04-28 MED ORDER — HYDROCODONE-ACETAMINOPHEN 5-325 MG PO TABS
1.0000 | ORAL_TABLET | ORAL | Status: DC | PRN
  Administered 2015-04-28 – 2015-05-02 (×19): 2 via ORAL
  Administered 2015-05-03: 1 via ORAL
  Administered 2015-05-03: 2 via ORAL
  Administered 2015-05-03: 1 via ORAL
  Administered 2015-05-03: 2 via ORAL
  Administered 2015-05-04: 1 via ORAL
  Filled 2015-04-28 (×19): qty 2
  Filled 2015-04-28: qty 1
  Filled 2015-04-28 (×3): qty 2

## 2015-04-28 MED ORDER — LEVOTHYROXINE SODIUM 88 MCG PO TABS
88.0000 ug | ORAL_TABLET | Freq: Every day | ORAL | Status: DC
  Administered 2015-04-29 – 2015-05-04 (×6): 88 ug via ORAL
  Filled 2015-04-28 (×7): qty 1

## 2015-04-28 MED ORDER — POLYSACCHARIDE IRON COMPLEX 150 MG PO CAPS
150.0000 mg | ORAL_CAPSULE | Freq: Two times a day (BID) | ORAL | Status: DC
  Administered 2015-04-28 – 2015-05-03 (×11): 150 mg via ORAL
  Filled 2015-04-28 (×13): qty 1

## 2015-04-28 MED ORDER — ACETAMINOPHEN 325 MG PO TABS
325.0000 mg | ORAL_TABLET | ORAL | Status: DC | PRN
Start: 2015-04-28 — End: 2015-05-04

## 2015-04-28 MED ORDER — ONDANSETRON HCL 4 MG PO TABS: 4.0000 mg | ORAL_TABLET | Freq: Three times a day (TID) | ORAL | Status: DC | PRN

## 2015-04-28 MED ORDER — METHOCARBAMOL 500 MG PO TABS
500.0000 mg | ORAL_TABLET | Freq: Four times a day (QID) | ORAL | Status: DC | PRN
  Administered 2015-04-29 – 2015-05-04 (×5): 500 mg via ORAL
  Filled 2015-04-28 (×6): qty 1

## 2015-04-28 MED ORDER — VITAMIN D (ERGOCALCIFEROL) 1.25 MG (50000 UNIT) PO CAPS
50000.0000 [IU] | ORAL_CAPSULE | ORAL | Status: DC
  Administered 2015-04-29 – 2015-05-04 (×3): 50000 [IU] via ORAL
  Filled 2015-04-28 (×3): qty 1

## 2015-04-28 MED ORDER — TRAZODONE HCL 50 MG PO TABS: 25.0000 mg | ORAL_TABLET | Freq: Every evening | ORAL | Status: DC | PRN

## 2015-04-28 MED ORDER — DOCUSATE SODIUM 100 MG PO CAPS: 100.0000 mg | ORAL_CAPSULE | Freq: Two times a day (BID) | ORAL | Status: DC

## 2015-04-28 MED ORDER — MENTHOL 3 MG MT LOZG
1.0000 | LOZENGE | OROMUCOSAL | Status: DC | PRN
  Filled 2015-04-28: qty 9

## 2015-04-28 NOTE — Progress Notes (Signed)
Rehab admissions - I met with pt and her husband in follow up to rehab MD consult to explain the possibility of inpatient rehab. Pt and her husband are already aware of our rehab program as pt was at [facility] in Dec 2015 -Jan. 2016. They are very interested in pt coming to CIR. Informational brochures were given and further questions were answered.  Pt has [employer] and we will need insurance authorization to consider possible inpatient rehab admit. In addition, we have limited bed availability today and the next few days. This was explained to both pt and her husband. They shared that pt has no benefits for SNF under her current [employer] plan and that they prefer CIR.   I have shared initial clinical information with RN for [facility]uermann and am now waiting on insurance determination. In addition, we are still determining available rehab beds for today.  I will keep the pt/family and medical team aware of any updates as I wait to hear from insurance.  I updated Hilda Blades, case Freight forwarder as well.  Thanks.  Nanetta Batty, PT Rehabilitation Admissions Coordinator [phone]

## 2015-04-28 NOTE — Progress Notes (Signed)
Physical Therapy Treatment Patient Details Name: [name] MRN: [id number] DOB: Dec 13, 1951 Today's Date: 04/28/2015    History of Present Illness TOTAL HIP ARTHROPLASTY AND REMOVAL OF IM AFFIXUS NAIL (Left) due to failure of the nail. 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 Continue to recommend CIR for ongoing Physical Therapy.  Pt fatigues very quickly with mobility and needs cueing for safest technique with mobility and to follow hip precautions. Pt very motivated to increase activity level.      Follow Up Recommendations  CIR;Supervision for mobility/OOB     Equipment Recommendations  Wheelchair (measurements PT)    Recommendations for Other Services       Precautions / Restrictions Precautions Precautions: Posterior Hip Precaution Booklet Issued: Yes (comment) Restrictions Weight Bearing Restrictions: Yes LLE Weight Bearing: Partial weight bearing LLE Partial Weight Bearing Percentage or Pounds: 50    Mobility  Bed Mobility Overal bed mobility:  (NT, pt up in chair when PT arrived)                Transfers Overall transfer level: Needs assistance Equipment used: Rolling walker (2 wheeled) Transfers: Sit to/from Stand Sit to Stand: Min assist         General transfer comment: Cues for technique-hand placement and to place LLE in front  Ambulation/Gait Ambulation/Gait assistance: Min assist Ambulation Distance (Feet): 20 Feet Assistive device: Rolling walker (2 wheeled) Gait Pattern/deviations: Step-to pattern;Decreased stride length;Antalgic Gait velocity: decreased Gait velocity interpretation: Below normal speed for age/gender General Gait Details: Needed less cues for sequencing today. Very fatigued after walking 78ft. Took 1 standing rest break. C/o feelign hot and nauseated after walking   Financial trader Rankin (Stroke Patients Only)        Balance                                    Cognition Arousal/Alertness: Awake/alert Behavior During Therapy: WFL for tasks assessed/performed Overall Cognitive Status: Within Functional Limits for tasks assessed                      Exercises Total Joint Exercises Ankle Circles/Pumps: AROM;Both;10 reps;Seated Quad Sets: AROM;10 reps;Supine;Both Hip ABduction/ADduction: AAROM;10 reps;Supine;Both Long Arc Quad: AROM;Both;10 reps;Seated    General Comments General comments (skin integrity, edema, etc.): husband not present for today's session. Pt hopefuo she will be allowed to go to CIR soon.      Pertinent Vitals/Pain Pain Assessment: 0-10 Pain Score: 4  Pain Location: left hip Pain Intervention(s): Limited activity within patient's tolerance;Monitored during session    Home Living                      Prior Function            PT Goals (current goals can now be found in the care plan section) Acute Rehab PT Goals Patient Stated Goal: To be able to return home Progress towards PT goals: Progressing toward goals    Frequency       PT Plan Current plan remains appropriate    Co-evaluation             End of Session Equipment Utilized During Treatment: Gait belt Activity Tolerance: Patient tolerated treatment well Patient left:  in chair;with call bell/phone within reach     Time: 0918-0945 PT Time Calculation (min) (ACUTE ONLY): 27 min  Charges:  $Gait Training: 8-22 mins $Therapeutic Exercise: 8-22 mins                    G Codes:      [name] 2015-05-25, 10:01 AM  [name], PT  [phone] 05-25-15

## 2015-04-28 NOTE — Progress Notes (Signed)
Nutrition Brief Note  RD received consult due to multiple allergies/intolerances/ food preferences.   Wt Readings from Last 15 Encounters:  04/23/15 192 lb 3.2 oz (87.181 kg)  12/16/14 193 lb (87.544 kg)  08/04/14 191 lb 12.8 oz (87 kg)  01/01/14 193 lb (87.544 kg)  11/01/13 190 lb (86.183 kg)  07/16/13 181 lb (82.101 kg)   Spoke with pt who reports she has gluten and soy intolerance and avoids peanuts. Updated allergies in [facility] and [facility]. RD has also requested food service staff to visit pt for meal selections.   Pt reports no further concerns with diet order or meal trays. Offered to place meal order, however, pt reports that this has already been placed and she is happy with her choice. Reviewed menu at bedside and discussed alternatives conducive for her diet restrictions and allergies. Pt also reveals she has some snacks from home.  Pt reports her appetite is returning. Plan is to d/c to CIR when medically stable, pending insurance approval.   Body mass index is 36.33 kg/(m^2). Patient meets criteria for obesity, class I based on current BMI.   Current diet order is gluten free, patient is consuming approximately 100% of meals at this time. Labs and medications reviewed.   No nutrition interventions warranted at this time. If nutrition issues arise, please consult RD.   Zyen Triggs A. Jimmye Norman, RD, LDN, CDE Pager: [phone] After hours Pager: [phone]

## 2015-04-28 NOTE — Progress Notes (Signed)
Report given to Cecilia, RN on 4 MW.  Will transfer pt. To 4 MW 08 via bed.  Alphonzo Lemmings, RN

## 2015-04-28 NOTE — Discharge Summary (Addendum)
Physician Discharge Summary  [name] ONG:[id number] DOB: 12/04/1951 DOA: 04/22/2015  PCP: Jerlyn Ly, MD  Admit date: 04/22/2015 Discharge date: 04/28/2015  Time spent: Greater than 30 minutes  Recommendations for Outpatient Follow-up:  1. To inpatient rehabilitation. 2. Follow-up with Dr. Alvan Dame in 2 weeks for wound check 3. 50%/partial weightbearing to left leg  Discharge Diagnoses:    Closed left hip fracture Active Problems:   HTN (hypertension)   Hypothyroidism   UTI (urinary tract infection)   Leg edema, left   Gluten intolerance   Postoperative anemia due to acute blood loss   Discharge Condition: Stable  Diet recommendation: Gluten free, no soy products  Filed Weights   04/23/15 0734  Weight: 87.181 kg (192 lb 3.2 oz)    History of present illness:   64 yo female who is 4 months s/p acute left comminuted peritrochanteric femur fracture treated with IM Nailing. She reports a several week history of increasing pain in the hip and increasingly difficulty ambulating. Currently admitted after eval in the ED revealed a hardware failure(nail breakage) and nonunion of the left peritroch fracture.  Hospital Course:   Admitted to hospitalist service. Orthopedics consulted.  Had uneventful Conversion of failed left hip surgery to left total hip replacement by Dr. Ihor Gully. He recommends 50% weightbearing. Patient worked with physical therapy and occupational therapy. They recommended inpatient rehabilitation. Physical medicine and rehabilitation has consulted and patient is stable for transfer today. Dr. Ihor Gully wants to see patient in 2 weeks.  Has had TED hose and Lovenox for DVT prophylaxis. May continue at rehabilitation.  UTI: Patient developed a fever postoperatively. Chest x-ray negative. Blood cultures negative to date. Urinalysis was consistent with UTI. Urine culture ordered but has not yet been collected. Have recommended a three-day course of Bactrim. Her leukocytosis  is improving and her fever has resolved on current therapy.   ABLA -some dilutional component -transfused 1 unit as patient symptomatic on 4/30. stable   Leg edema, left noted on admission -duplex negative  Other medical problems remain stable.  Code Status: full    Consultants:  Ortho  PM&R  Procedures: Conversion of failed left hip surgery to left total hip replacement  Discharge Exam: Filed Vitals:   04/28/15 1359  BP: 117/65  Pulse: 98  Temp: 99.1 F (37.3 C)  Resp: 14    General: Alert, oriented. Cardiovascular: Regular rate rhythm without murmurs gallops rubs Respiratory: Clear to auscultation bilaterally without wheezes rhonchi or rales Extremities left leg dressings clean dry and intact.  Discharge Instructions   Discharge Instructions    Diet - low sodium heart healthy    Complete by:  As directed      Walk with assistance    Complete by:  As directed           Current Discharge Medication List    START taking these medications   Details  bisacodyl (DULCOLAX) 10 MG suppository Place 1 suppository (10 mg total) rectally daily as needed for moderate constipation. Qty: 12 suppository, Refills: 0    docusate sodium (COLACE) 100 MG capsule Take 1 capsule (100 mg total) by mouth 2 (two) times daily. Qty: 10 capsule, Refills: 0    enoxaparin (LOVENOX) 40 MG/0.4ML injection Inject 0.4 mLs (40 mg total) into the skin daily. For 4 weeks Qty: 0 Syringe    ferrous sulfate 325 (65 FE) MG tablet Take 1 tablet (325 mg total) by mouth daily with breakfast. Refills: 3    ondansetron (ZOFRAN) 4 MG tablet  Take 1 tablet (4 mg total) by mouth every 8 (eight) hours as needed for nausea or vomiting. Qty: 20 tablet, Refills: 0    polyethylene glycol (MIRALAX / GLYCOLAX) packet Take 17 g by mouth daily as needed for mild constipation. Qty: 14 each, Refills: 0    sulfamethoxazole-trimethoprim (BACTRIM DS,SEPTRA DS) 800-160 MG per tablet Take 1 tablet by mouth  every 12 (twelve) hours. For 4 more doses      CONTINUE these medications which have NOT CHANGED   Details  azelastine (ASTELIN) 0.1 % nasal spray Place 1 spray into both nostrils 2 (two) times daily. Use in each nostril as directed Qty: 90 mL, Refills: 3   Associated Diagnoses: Seasonal allergies    Cyanocobalamin (VITAMIN B-12 PO) Take 1 tablet by mouth daily.    fluticasone (FLONASE) 50 MCG/ACT nasal spray Place 2 sprays into both nostrils daily. Qty: 48 g, Refills: 3   Associated Diagnoses: Seasonal allergies    HYDROcodone-acetaminophen (NORCO/VICODIN) 5-325 MG per tablet Take 1-2 tablets by mouth every 6 (six) hours as needed for moderate pain. Qty: 90 tablet, Refills: 0    levothyroxine (SYNTHROID) 88 MCG tablet Take 1 tablet (88 mcg total) by mouth daily. Qty: 90 tablet, Refills: 3    valsartan (DIOVAN) 320 MG tablet TAKE 1 TABLET DAILY Qty: 90 tablet, Refills: 3   Associated Diagnoses: Essential hypertension    Vitamin D, Ergocalciferol, (DRISDOL) 50000 UNITS CAPS capsule Take 50,000 Units by mouth 3 (three) times a week.    acetaminophen (TYLENOL) 325 MG tablet Take 2 tablets (650 mg total) by mouth every 6 (six) hours as needed. Qty: 60 tablet, Refills: 1      STOP taking these medications     methocarbamol (ROBAXIN) 750 MG tablet        Allergies  Allergen Reactions  . Gluten Meal   . Soy Allergy   . Statins Diarrhea  . Eggs Or Egg-Derived Products Diarrhea and Rash   Follow-up Information    Follow up with Mauri Pole, MD In 2 weeks.   Specialty:  Orthopedic Surgery   Why:  For wound check   Contact information:   [address] [place] [address] [phone]        The results of significant diagnostics from this hospitalization (including imaging, microbiology, ancillary and laboratory) are listed below for reference.    Significant Diagnostic Studies: Dg Chest 1 View  04/22/2015   CLINICAL DATA:  Progressive left leg  pain. Failure of the proximal aspect of left femoral intramedullary rod.  EXAM: CHEST  1 VIEW  COMPARISON:  12/15/2014  FINDINGS: Speckled density in the right proximal humerus measuring 9.2 cm in length probably representing enchondroma.  Atherosclerotic aortic arch. Heart size within normal limits. The lungs appear clear.  IMPRESSION: 1. No active cardiopulmonary disease is radiographically apparent. 2. Large right proximal humeral enchondroma. In the absence of symptoms, this does not require further workup.   Electronically Signed   By: Van Clines M.D.   On: 04/22/2015 08:58   Dg Pelvis 1-2 Views  04/22/2015   CLINICAL DATA:  Acute onset of left leg pain.  Initial encounter.  EXAM: PELVIS - 1-2 VIEW  COMPARISON:  None.  FINDINGS: There is a fracture through the proximal aspect of the intramedullary rod, at its intersection with the hip screws, with angulation of the proximal portion of the intramedullary rod. Underlying known intertrochanteric fracture fragments are difficult to fully assess. The left femoral head remains seated at  the acetabulum. The right hip joint is unremarkable in appearance.  Mild degenerative change is noted at the lower lumbar spine. The visualized bowel gas pattern is grossly unremarkable.  IMPRESSION: Failure of the proximal aspect of the left femoral intramedullary rod, with a fracture through the rod at its intersection with the hip screws, and angulation of the proximal portion of the intramedullary rod. Underlying intertrochanteric fracture fragment alignment not well assessed.   Electronically Signed   By: Garald Balding M.D.   On: 04/22/2015 06:34   Ct Lumbar Spine Wo Contrast  04/22/2015   CLINICAL DATA:  Low back and left hip pain. Lumbar compression fracture. Initial encounter.  EXAM: CT LUMBAR SPINE WITHOUT CONTRAST  TECHNIQUE: Multidetector CT imaging of the lumbar spine was performed without intravenous contrast administration. Multiplanar CT image  reconstructions were also generated.  COMPARISON:  None.  FINDINGS: The patient has a compression fracture of L4 with vertebral body height loss of up to 95%. There is bony retropulsion off the superior endplate as described below. The fracture is of indeterminate chronicity. No extension into the posterior elements is identified. No other fracture is seen. Vertebral body alignment is maintained. Imaged paraspinous structures demonstrate scattered aortic atherosclerosis without aneurysm.  T11-12: The disc is calcified. No bulge or protrusion. The central canal and foramina appear open.  T12-L1: The disc is calcified. No bulge or protrusion. The central canal and foramina appear open.  L1-2:  Negative.  L2-3: There is some facet degenerative disease. No disc bulge or protrusion. The central canal and foramina are open.  L3-4: Bony retropulsion off the superior endplate of L4 results in moderately severe central canal stenosis. Moderate bilateral foraminal narrowing is also seen.  L4-5: Advanced facet degenerative disease is seen. There is a shallow disc bulge and some ligamentum flavum thickening. Moderate to moderately severe central canal stenosis is present. Left worse than right foraminal narrowing is identified.  L5-S1: Shallow disc bulge and endplate spur without central canal narrowing. Moderately severe to severe foraminal narrowing appears worse on the left.  IMPRESSION: Age-indeterminate L4 compression fracture with up to 95 % vertebral body height loss. Bony retropulsion off the superior endplate of L4 results in moderately severe central canal stenosis at L3-4.  Moderate to moderately severe central canal stenosis L4-5 where there is left worse than right foraminal narrowing.  Moderately severe to severe bilateral foraminal narrowing L5-S1 is worse on the left.   Electronically Signed   By: Inge Rise M.D.   On: 04/22/2015 09:13   Dg Chest Port 1 View  04/26/2015   CLINICAL DATA:  Low-grade fever  for 2 days  EXAM: PORTABLE CHEST - 1 VIEW  COMPARISON:  04/22/2015  FINDINGS: Heart size and vascular pattern normal.  No infiltrate or effusion.  Partial visualization of extensive sclerotic lesion right humeral head neck and shaft. As described on report of prior study this is consistent with an enchondroma and would require further evaluation if there is pain associated with it.  IMPRESSION: No acute findings.   Electronically Signed   By: Skipper Cliche M.D.   On: 04/26/2015 17:40   Dg Hip Port Unilat With Pelvis 1v Left  04/24/2015   CLINICAL DATA:  Status post hip replacement.  EXAM: LEFT HIP (WITH PELVIS) 1 VIEW PORTABLE  COMPARISON:  04/22/2015  FINDINGS: Removed intra medullary nail, now with total arthroplasty that is reinforced with cerclage and claw plate. There is no evidence of periprosthetic fracture or dislocation.  IMPRESSION: Conversion to  total left hip arthroplasty.  No acute findings.   Electronically Signed   By: Monte Fantasia M.D.   On: 04/24/2015 13:29   Dg Femur Min 2 Views Left  04/22/2015   CLINICAL DATA:  Acute onset of left leg pain. Unable to bear weight. Initial encounter.  EXAM: LEFT FEMUR 2 VIEWS  COMPARISON:  None.  FINDINGS: There appears to be a fracture through the proximal aspect of the patient's intramedullary rod, at the level at which it intersects with the hip screws. The proximal intramedullary rod is abnormally angulated. There is some degree of displacement of underlying intertrochanteric fracture fragments, difficult to fully assess.  The left femoral head remains seated at the acetabulum. No significant soft tissue abnormalities are characterized on radiograph. The knee joint is grossly unremarkable, with underlying mild osteoarthritis.  IMPRESSION: Failure of the proximal aspect of the patient's intramedullary rod, with a fracture through the rod, at the level at which it intersects with the hip screws. The proximal intramedullary rod is abnormally angulated.  Some degree of displacement of underlying intertrochanteric fracture fragments, difficult to fully assess.   Electronically Signed   By: Garald Balding M.D.   On: 04/22/2015 06:27    Microbiology: Recent Results (from the past 240 hour(s))  Urine culture     Status: None   Collection Time: 04/22/15  8:20 AM  Result Value Ref Range Status   Specimen Description URINE, CATHETERIZED  Final   Special Requests ADDED [id number]  Final   Colony Count NO GROWTH Performed at [facility]   Final   Culture NO GROWTH Performed at [facility]   Final   Report Status 04/23/2015 FINAL  Final  Surgical PCR screen     Status: None   Collection Time: 04/23/15  3:06 PM  Result Value Ref Range Status   MRSA, PCR NEGATIVE NEGATIVE Final   Staphylococcus aureus NEGATIVE NEGATIVE Final    Comment:        The Xpert SA Assay (FDA approved for NASAL specimens in patients over 99 years of age), is one component of a comprehensive surveillance program.  Test performance has been validated by [facility] for patients greater than or equal to 83 year old. It is not intended to diagnose infection nor to guide or monitor treatment.   Culture, blood (routine x 2)     Status: None (Preliminary result)   Collection Time: 04/26/15  7:30 PM  Result Value Ref Range Status   Specimen Description BLOOD RIGHT ARM  Final   Special Requests BOTTLES DRAWN AEROBIC AND ANAEROBIC 5CC  Final   Culture   Final           BLOOD CULTURE RECEIVED NO GROWTH TO DATE CULTURE WILL BE HELD FOR 5 DAYS BEFORE ISSUING A FINAL NEGATIVE REPORT Performed at [facility]    Report Status PENDING  Incomplete     Labs: Basic Metabolic Panel:  Recent Labs Lab 04/22/15 0709 04/22/15 1219 04/22/15 1518 04/24/15 1447 04/25/15 0541 04/26/15 0642 04/27/15 0548  NA 134* 137  --   --  135 135 133*  K 3.6 3.4*  --   --  4.5 3.9 3.9  CL 102 104  --   --  103 101 100*  CO2 24 26  --   --  26 28 27    GLUCOSE 147* 146*  --   --  172* 135* 154*  BUN 22 18  --   --  15 9  9  CREATININE 0.67 0.64  --  0.70 0.78 0.72 0.62  CALCIUM 9.2 9.1 9.2  --  8.5 8.5* 8.5*   Liver Function Tests:  Recent Labs Lab 04/22/15 0709 04/22/15 1219 04/22/15 1518  AST 16 18  --   ALT 21 19  --   ALKPHOS 90 86  --   BILITOT 0.5 0.6  --   PROT 6.7 6.1  --   ALBUMIN 3.7 3.4* 3.6   No results for input(s): LIPASE, AMYLASE in the last 168 hours. No results for input(s): AMMONIA in the last 168 hours. CBC:  Recent Labs Lab 04/22/15 0709 04/24/15 1447 04/25/15 0541 04/26/15 0642 04/27/15 0548 04/28/15 0648  WBC 10.2 21.4* 14.4* 16.2* 18.0* 13.9*  NEUTROABS 7.5  --   --   --   --   --   HGB 12.3 9.6* 7.4* 8.8* 8.2* 8.0*  HCT 37.2 29.9* 22.8* 26.4* 24.5* 24.4*  MCV 83.4 85.2 85.1 82.8 83.6 84.4  PLT 246 303 257 272 303 406*   Cardiac Enzymes: No results for input(s): CKTOTAL, CKMB, CKMBINDEX, TROPONINI in the last 168 hours. BNP: BNP (last 3 results) No results for input(s): BNP in the last 8760 hours.  ProBNP (last 3 results) No results for input(s): PROBNP in the last 8760 hours.  CBG: No results for input(s): GLUCAP in the last 168 hours.     SignedDelfina Redwood  [facility] 04/28/2015, 3:10 PM

## 2015-04-28 NOTE — H&P (Signed)
Physical Medicine and Rehabilitation Admission H&P   Chief Complaint  Patient presents with  . Leg Pain due to hardware failure    HPI: [name] is a 64 y.o. female with history of left femur fracture treated with IM Nailing 11/2014 (with CIR for therapy) who has had increase in pain in the hip for the past few weeks with difficulty walking. 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 She was started on narcotics for pain management and underwent conversion to L-THR by Dr. Alvan Dame on 04/24/15. Post op 50% WB on LLE and SCDs for DVT prophylaxis. Follow up labs with ABLA with drop in H/H to 8.0.  She developed fever on 05/01 with T- 102 and was noted to have elevated WBC -18.0 due to UTI. She was started on septra for treatment and has defervesced    Review of Systems  HENT: Negative for hearing loss.  Eyes: Negative for blurred vision and double vision.  Respiratory: Negative for cough and shortness of breath.  Cardiovascular: Negative for chest pain and palpitations.  Gastrointestinal: Positive for nausea (since last night) and diarrhea. Negative for heartburn, vomiting and abdominal pain.  Genitourinary: Negative for urgency and frequency.  Musculoskeletal: Positive for joint pain.  Neurological: Positive for headaches (due to congestion). Negative for dizziness and tingling.  Psychiatric/Behavioral: The patient is not nervous/anxious and does not have insomnia.      Past Medical History  Diagnosis Date  . Chickenpox   . Genital warts   . Environmental allergies   . HTN (hypertension)   . Hyperlipidemia   . Thyroid disease   . Thyroid nodule   . Hypothyroidism   . Complication of anesthesia     "hard to wake up:  Marland Kitchen Family history of adverse reaction to anesthesia     "sister & mom are hard to wake up:   . Heart murmur   . 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 Carpal tunnel release Bilateral 1990's  . Cystectomy      From the cervix  . Intramedullary (im) nail intertrochanteric Left 12/15/2014    Procedure: INTRAMEDULLARY (IM) NAIL INTERTROCHANTRIC; Surgeon: Augustin Schooling, MD; Location: [facility]; Service: Orthopedics; Laterality: Left;  . Fracture surgery    . Total hip arthroplasty Left 04/24/2015    Procedure: TOTAL HIP ARTHROPLASTY AND REMOVAL OF IM AFFIXUS NAIL ; Surgeon: Paralee Cancel, MD; Location: [facility]; Service: Orthopedics; Laterality: Left;    Family History  Problem Relation Age of Onset  . Arthritis Mother   . Hyperlipidemia Mother   . Heart disease Father   . Stroke Father     x's 2  . High blood pressure Father   . High blood pressure Mother   . Diabetes Mother   . Myasthenia gravis Mother     Social History: Married. Independent with cane PTA due to hip surgery. 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 She reports that she has never smoked. She has never used smokeless tobacco. She reports that she drinks alcohol on occasion. She reports that she does not use illicit drugs.    Allergies  Allergen Reactions  . Gluten Meal   . Statins Diarrhea  . Eggs Or Egg-Derived Products Diarrhea and Rash    Medications Prior  to Admission  Medication Sig Dispense Refill  . azelastine (ASTELIN) 0.1 % nasal spray Place 1 spray into both nostrils 2 (two) times daily. Use in each nostril as directed 90 mL 3  . Cyanocobalamin (VITAMIN B-12 PO) Take 1 tablet by mouth daily.    . fluticasone (FLONASE) 50 MCG/ACT nasal spray Place 2 sprays into both nostrils daily. 48 g 3  . HYDROcodone-acetaminophen (NORCO/VICODIN) 5-325 MG per tablet Take 1-2 tablets by  mouth every 6 (six) hours as needed for moderate pain. 90 tablet 0  . levothyroxine (SYNTHROID) 88 MCG tablet Take 1 tablet (88 mcg total) by mouth daily. 90 tablet 3  . valsartan (DIOVAN) 320 MG tablet TAKE 1 TABLET DAILY 90 tablet 3  . Vitamin D, Ergocalciferol, (DRISDOL) 50000 UNITS CAPS capsule Take 50,000 Units by mouth 3 (three) times a week.    Marland Kitchen acetaminophen (TYLENOL) 325 MG tablet Take 2 tablets (650 mg total) by mouth every 6 (six) hours as needed. (Patient not taking: Reported on 04/22/2015) 60 tablet 1  . methocarbamol (ROBAXIN) 750 MG tablet Take 1 tablet (750 mg total) by mouth every 6 (six) hours as needed for muscle spasms. (Patient not taking: Reported on 04/22/2015) 90 tablet 0    Home: Home Living Family/patient expects to be discharged to:: Private residence Living Arrangements: Spouse/significant other Available Help at Discharge: Family, Available PRN/intermittently Type of Home: House Home Access: Stairs to enter Technical brewer of Steps: 3 Entrance Stairs-Rails: Right, Left Home Layout: One level Home Equipment: Environmental consultant - 2 wheels, Cane - single point, Bedside commode, Shower seat, Tub bench  Functional History: Prior Function Level of Independence: Independent with assistive device(s)  Functional Status:  Mobility: Bed Mobility Overal bed mobility: (NT, pt up in chair when PT arrived) Bed Mobility: Supine to Sit Supine to sit: Mod assist, +2 for physical assistance General bed mobility comments: pt up in recliner Transfers Overall transfer level: Needs assistance Equipment used: Rolling walker (2 wheeled) Transfers: Sit to/from Stand Sit to Stand: Min assist General transfer comment: Cues for technique-hand placement and to place LLE in front Ambulation/Gait Ambulation/Gait assistance: Min assist Ambulation Distance (Feet): 20 Feet Assistive device: Rolling walker (2 wheeled) General Gait Details: Needed less cues  for sequencing today. Very fatigued after walking 36f. Took 1 standing rest break. C/o feelign hot and nauseated after walking Gait Pattern/deviations: Step-to pattern, Decreased stride length, Antalgic Gait velocity: decreased Gait velocity interpretation: Below normal speed for age/gender    ADL: ADL Overall ADL's : Needs assistance/impaired Grooming: Wash/dry hands, Wash/dry face, Standing, Minimal assistance Upper Body Bathing: Supervision/ safety, Set up, Sitting Lower Body Bathing: Moderate assistance Upper Body Dressing : Supervision/safety, Set up, Sitting Lower Body Dressing: Maximal assistance, Adhering to hip precautions Toilet Transfer: Minimal assistance, Comfort height toilet, RW, Cueing for safety Toilet Transfer Details (indicate cue type and reason): cues for hand placement Toileting- Clothing Manipulation and Hygiene: Minimal assistance, Sit to/from stand Functional mobility during ADLs: Minimal assistance, Cueing for safety  Cognition: Cognition Overall Cognitive Status: Within Functional Limits for tasks assessed Orientation Level: Oriented X4 Cognition Arousal/Alertness: Awake/alert Behavior During Therapy: WFL for tasks assessed/performed Overall Cognitive Status: Within Functional Limits for tasks assessed    Blood pressure 142/57, pulse 93, temperature 98.5 F (36.9 C), temperature source Oral, resp. rate 16, height _0  (1.549 m), weight 87.181 kg (192 lb 3.2 oz), SpO2 95 %. Physical Exam  Nursing note and vitals reviewed. Constitutional: She is oriented to person, place, and time. She appears well-developed  and well-nourished.  HENT: oral mucosa pink and moist Head: Normocephalic and atraumatic.  Eyes: Conjunctivae are normal. Pupils are equal, round, and reactive to light.  Neck: Normal range of motion. Neck supple.  Cardiovascular: Normal rate and regular rhythm. no murmur, norubs Respiratory: Effort normal and breath sounds normal.  GI: Soft.  Bowel sounds are normal. She exhibits no distension. There is no tenderness.  Musculoskeletal: She exhibits edema.  Surgical dressing on left hip/thigh. Wounds with minimal drainage.. Small incision lateral to knee clean, dry and intact. Moderate edema at left hip and left ankle.  Neurological: She is alert and oriented to person, place, and time.  Speech clear. Follows commands without difficulty. UE's 4/5. LLE: 1/5 prox to 3/5 distally. RLE: 3/5 to 4/5 prox to distal.  Skin: Skin is warm and dry.  Psychiatric: She has a normal mood and affect. Her behavior is normal. Judgment and thought content normal.     Lab Results Last 48 Hours    Results for orders placed or performed during the hospital encounter of 04/22/15 (from the past 48 hour(s))  Culture, blood (routine x 2) Status: None (Preliminary result)   Collection Time: 04/26/15 7:30 PM  Result Value Ref Range   Specimen Description BLOOD RIGHT ARM    Special Requests BOTTLES DRAWN AEROBIC AND ANAEROBIC 5CC    Culture       BLOOD CULTURE RECEIVED NO GROWTH TO DATE CULTURE WILL BE HELD FOR 5 DAYS BEFORE ISSUING A FINAL NEGATIVE REPORT Performed at [facility]    Report Status PENDING   Urinalysis, Routine w reflex microscopic Status: Abnormal   Collection Time: 04/26/15 11:37 PM  Result Value Ref Range   Color, Urine YELLOW YELLOW   APPearance CLOUDY (A) CLEAR   Specific Gravity, Urine 1.019 1.005 - 1.030   pH 6.5 5.0 - 8.0   Glucose, UA NEGATIVE NEGATIVE mg/dL   Hgb urine dipstick NEGATIVE NEGATIVE   Bilirubin Urine NEGATIVE NEGATIVE   Ketones, ur NEGATIVE NEGATIVE mg/dL   Protein, ur NEGATIVE NEGATIVE mg/dL   Urobilinogen, UA 1.0 0.0 - 1.0 mg/dL   Nitrite NEGATIVE NEGATIVE   Leukocytes, UA MODERATE (A) NEGATIVE  Urine microscopic-add on Status: Abnormal   Collection Time: 04/26/15 11:37 PM  Result Value Ref  Range   Squamous Epithelial / LPF MANY (A) RARE   WBC, UA TOO NUMEROUS TO COUNT <3 WBC/hpf   Bacteria, UA RARE RARE  CBC Status: Abnormal   Collection Time: 04/27/15 5:48 AM  Result Value Ref Range   WBC 18.0 (H) 4.0 - 10.5 K/uL   RBC 2.93 (L) 3.87 - 5.11 MIL/uL   Hemoglobin 8.2 (L) 12.0 - 15.0 g/dL   HCT 24.5 (L) 36.0 - 46.0 %   MCV 83.6 78.0 - 100.0 fL   MCH 28.0 26.0 - 34.0 pg   MCHC 33.5 30.0 - 36.0 g/dL   RDW 14.0 11.5 - 15.5 %   Platelets 303 150 - 400 K/uL  Basic metabolic panel Status: Abnormal   Collection Time: 04/27/15 5:48 AM  Result Value Ref Range   Sodium 133 (L) 135 - 145 mmol/L   Potassium 3.9 3.5 - 5.1 mmol/L   Chloride 100 (L) 101 - 111 mmol/L   CO2 27 22 - 32 mmol/L   Glucose, Bld 154 (H) 70 - 99 mg/dL   BUN 9 6 - 20 mg/dL   Creatinine, Ser 0.62 0.44 - 1.00 mg/dL   Calcium 8.5 (L) 8.9 - 10.3 mg/dL   GFR calc  non Af Amer >60 >60 mL/min   GFR calc Af Amer >60 >60 mL/min    Comment: (NOTE) The eGFR has been calculated using the CKD EPI equation. This calculation has not been validated in all clinical situations. eGFR's persistently <90 mL/min signify possible Chronic Kidney Disease.    Anion gap 6 5 - 15  CBC Status: Abnormal   Collection Time: 04/28/15 6:48 AM  Result Value Ref Range   WBC 13.9 (H) 4.0 - 10.5 K/uL   RBC 2.89 (L) 3.87 - 5.11 MIL/uL   Hemoglobin 8.0 (L) 12.0 - 15.0 g/dL   HCT 24.4 (L) 36.0 - 46.0 %   MCV 84.4 78.0 - 100.0 fL   MCH 27.7 26.0 - 34.0 pg   MCHC 32.8 30.0 - 36.0 g/dL   RDW 14.1 11.5 - 15.5 %   Platelets 406 (H) 150 - 400 K/uL      Imaging Results (Last 48 hours)    Dg Chest Port 1 View  04/26/2015 CLINICAL DATA: Low-grade fever for 2 days EXAM: PORTABLE CHEST - 1 VIEW COMPARISON: 04/22/2015 FINDINGS: Heart size and vascular pattern normal. No  infiltrate or effusion. Partial visualization of extensive sclerotic lesion right humeral head neck and shaft. As described on report of prior study this is consistent with an enchondroma and would require further evaluation if there is pain associated with it. IMPRESSION: No acute findings. Electronically Signed By: Skipper Cliche M.D. On: 04/26/2015 17:40        Medical Problem List and Plan: 1. 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 DVT Prophylaxis/Anticoagulation: Pharmaceutical: Lovenox 3. Pain Management: Continue hydrocodone prn 4. Mood: LCSW to follow for evaluation and support.  5. Neuropsych: This patient is capable of making decisions on her own behalf. 6. Skin/Wound Care: Routine pressure relief measures.  7. Fluids/Electrolytes/Nutrition: Monitor I/O. Check lytes in am.  8. Reactive Leucocytosis: Monitor for signs of infection.  9. UTI: Continue septra DS bid # 2/5 10. ABLA: continue iron supplement. Recheck labs in am. 12. HTN: Monitor BP every shift. Continue Avapro.  13. Nausea: Question antibiotic related. Also reporting abdominal cramps--will check stool for c diff and d/c colace. Will add pepcid to help with GI symptoms and change iron to niferex as better tolerated.    Post Admission Physician Evaluation: 1. Functional deficits secondary to left peritrochanteric hip fx/hardware failure---conversion to left THA. 2. Patient is admitted to receive collaborative, interdisciplinary care between the physiatrist, rehab nursing staff, and therapy team. 3. Patient's level of medical complexity and substantial therapy needs in context of that medical necessity cannot be provided at a lesser intensity of care such as a SNF. 4. Patient has experienced substantial functional loss from his/her baseline which was documented above under the "Functional History" and "Functional Status" headings. Judging by the  patient's diagnosis, physical exam, and functional history, the patient has potential for functional progress which will result in measurable gains while on inpatient rehab. These gains will be of substantial and practical use upon discharge in facilitating mobility and self-care at the household level. 5. Physiatrist will provide 24 hour management of medical needs as well as oversight of the therapy plan/treatment and provide guidance as appropriate regarding the interaction of the two. 6. 24 hour rehab nursing will assist with bladder management, bowel management, safety, skin/wound care, disease management, medication administration, pain management and patient education and help integrate therapy concepts, techniques,education, etc. 7. PT will assess and treat for/with: Lower extremity strength, range  of motion, stamina, balance, functional mobility, safety, adaptive techniques and equipment, hip precautions, pain control, community reintegration. Goals are: mod I. 8. OT will assess and treat for/with: ADL's, functional mobility, safety, upper extremity strength, adaptive techniques and equipment, pain mgt, hip precautions, ego support, community reintegration. Goals are: mod I. Therapy may proceed with showering this patient if wounds covered.. 9. SLP will assess and treat for/with: n/a. Goals are: n/a. 10. Case Management and Social Worker will assess and treat for psychological issues and discharge planning. 11. Team conference will be held weekly to assess progress toward goals and to determine barriers to discharge. 12. Patient will receive at least 3 hours of therapy per day at least 5 days per week. 13. ELOS: 7-8 days  14. Prognosis: excellent     [name], MD, [facility] 04/28/2015

## 2015-04-28 NOTE — PMR Pre-admission (Signed)
PMR Admission Coordinator Pre-Admission Assessment  Patient: [name] is an 64 y.o., female MRN: [id number] DOB: 06-07-1951 Height: _0  (154.9 cm) Weight: 87.181 kg (192 lb 3.2 oz)              Insurance Information  PRIMARY: [place]      Policy#: [id number]      Subscriber: self CM Name: Inocencio Homes      Phone#: [phone], ext. [phone]     Fax#: [phone] Approval given from 04-28-15 through 05-05-15 with updates due to Judson Roch, RN at above fax. If pt is not DC'd on 05-05-15, then updates are due on 05-05-15 with phone update to Judson Roch as well. Pre-Cert#: [id number]      Employer: working full time for [employer] Benefits:  Phone #: [phone]     Name: [name]. 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 female with history of left femur fracture treated with IM Nailing 11/2014 (with  CIR for therapy) who has had increase in pain in the hip for the past few weeks with difficulty walking. 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 She was started on narcotics for pain management and underwent conversion to L-THR by Dr. Alvan Dame on 04/24/15. Post op 50% WB on LLE and SCDs for DVT prophylaxis.  Follow up labs with ABLA with drop in H/H to 7.4/26.4.  She was febrile last pm with T- 102 and was noted to  have elevated WBC -18.0 due to UTI. Inpatient rehab was recommended and pt was making good progress in therapy.   Past Medical History  Past Medical History  Diagnosis Date  . Chickenpox   . Genital warts   . Environmental allergies   . HTN (hypertension)   . Hyperlipidemia   . Thyroid disease   . Thyroid nodule   . Hypothyroidism   . Complication of anesthesia     "hard to wake up:  Marland Kitchen Family history of adverse reaction to anesthesia     "sister & mom are hard to wake up:  . Heart murmur   . Arthritis     Bilateral in the Knees and fingers (04/22/2015)    Family History  family history includes Arthritis in her mother; Diabetes in her mother; Heart disease in her father; High blood pressure in her father and mother; Hyperlipidemia in her mother; Myasthenia gravis in her mother; Stroke in her father.  Prior Rehab/Hospitalizations: pt was at [facility] in late Dec. 2015 - January 2016 after THA. She then had follow up home health services and also was most recently getting outpatient physical therapy.   Current Medications   Current facility-administered medications:  .  0.9 %  sodium  chloride infusion, , Intravenous, Once, Paralee Cancel, MD .  acetaminophen (TYLENOL) tablet 650 mg, 650 mg, Oral, Q6H PRN, Geradine Girt, DO, 650 mg at 04/26/15 1641 .  alum & mag hydroxide-simeth (MAALOX/MYLANTA) 200-200-20 MG/5ML suspension 30 mL, 30 mL, Oral, Q4H PRN, Danae Orleans, PA-C .  bisacodyl (DULCOLAX) suppository 10 mg, 10 mg, Rectal, Daily PRN, Danae Orleans, PA-C .  diphenhydrAMINE (BENADRYL) capsule 25 mg, 25 mg, Oral, Q6H PRN, Danae Orleans, PA-C, 25 mg at 04/27/15 1204 .  diphenhydrAMINE-zinc acetate (BENADRYL) 2-0.1 % cream, , Topical, BID PRN, Dianne Dun, NP .  docusate sodium (COLACE) capsule 100 mg, 100 mg, Oral, BID, Samella Parr, NP, 100 mg at 04/27/15 1000 .  enoxaparin (LOVENOX) injection 40 mg, 40 mg, Subcutaneous, Q24H, Matthew Babish, PA-C, 40 mg at 04/28/15  1058 .  ferrous sulfate tablet 325 mg, 325 mg, Oral, TID PC, Samella Parr, NP, 325 mg at 04/28/15 1226 .  HYDROcodone-acetaminophen (NORCO/VICODIN) 5-325 MG per tablet 1-2 tablet, 1-2 tablet, Oral, Q4H PRN, Danae Orleans, PA-C, 2 tablet at 04/28/15 1226 .  HYDROmorphone (DILAUDID) injection 1 mg, 1 mg, Intravenous, Q2H PRN, Samella Parr, NP, 1 mg at 04/28/15 0547 .  irbesartan (AVAPRO) tablet 37.5 mg, 37.5 mg, Oral, Daily, Jessica U Vann, DO, 37.5 mg at 04/28/15 1058 .  levothyroxine (SYNTHROID, LEVOTHROID) tablet 88 mcg, 88 mcg, Oral, QAC breakfast, Samella Parr, NP, 88 mcg at 04/28/15 1057 .  menthol-cetylpyridinium (CEPACOL) lozenge 3 mg, 1 lozenge, Oral, PRN **OR** phenol (CHLORASEPTIC) mouth spray 1 spray, 1 spray, Mouth/Throat, PRN, Danae Orleans, PA-C .  metoCLOPramide (REGLAN) tablet 5-10 mg, 5-10 mg, Oral, Q8H PRN **OR** metoCLOPramide (REGLAN) injection 5-10 mg, 5-10 mg, Intravenous, Q8H PRN, Danae Orleans, PA-C .  ondansetron (ZOFRAN) injection 4 mg, 4 mg, Intravenous, Q6H PRN, Tomi Bamberger Vann, DO, 4 mg at 04/23/15 1310 .  polyethylene glycol (MIRALAX / GLYCOLAX) packet 17 g, 17 g, Oral, Daily PRN, Danae Orleans, PA-C .  sulfamethoxazole-trimethoprim (BACTRIM DS,SEPTRA DS) 800-160 MG per tablet 1 tablet, 1 tablet, Oral, Q12H, Delfina Redwood, MD, 1 tablet at 04/28/15 1058 .  Vitamin D (Ergocalciferol) (DRISDOL) capsule 50,000 Units, 50,000 Units, Oral, Once per day on Mon Wed Fri, Samella Parr, NP, 50,000 Units at 04/27/15 1159  Patients Current Diet: Diet gluten free Room service appropriate?: Yes; Fluid consistency:: Thin  Precautions / Restrictions Precautions Precautions: Posterior Hip Precaution Booklet Issued: Yes (comment) Precaution Comments: Reviewed hip precautions with pt and husband Restrictions Weight Bearing Restrictions: Yes LLE Weight Bearing: Partial weight bearing LLE Partial Weight Bearing Percentage or Pounds: 50 Other Position/Activity  Restrictions: 50%   Prior Activity Level Community (5-7x/wk): Pt got out nearly everyday and 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 She enjoys arts and doing painting/drawing. Pt was going to outpt PT for further strengthening with her cane.   Home Assistive Devices / Equipment Home Assistive Devices/Equipment: Eyeglasses, Radio producer (specify quad or straight), Walker (specify type), Bedside commode/3-in-1, Hand-held shower hose Home Equipment: Walker - 2 wheels, Cane - single point, Bedside commode, Shower seat, Tub bench  Prior Functional Level Prior Function Level of Independence: Independent with assistive device(s)  Current Functional Level Cognition  Overall Cognitive Status: Within Functional Limits for tasks assessed Orientation Level: Oriented X4    Extremity Assessment (includes Sensation/Coordination)  Upper Extremity Assessment: Generalized weakness  Lower Extremity Assessment: Defer to PT evaluation LLE Deficits / Details: ROM and strength greatly limited in LLE  ADLs  Overall ADL's : Needs assistance/impaired Grooming: Wash/dry hands, Wash/dry face, Standing, Minimal assistance Upper Body Bathing: Supervision/ safety, Set up, Sitting Lower Body Bathing: Moderate assistance Upper Body Dressing : Supervision/safety, Set up, Sitting Lower Body Dressing: Maximal assistance, Adhering to hip precautions Toilet Transfer: Minimal assistance, Comfort height toilet, RW, Cueing for safety Toilet Transfer Details (indicate cue type and reason): cues for hand placement Toileting- Clothing Manipulation and Hygiene: Minimal assistance, Sit to/from stand Functional mobility during ADLs: Minimal assistance, Cueing for safety    Mobility  Overal bed mobility:  (NT, pt up in chair when PT arrived) Bed Mobility: Supine to Sit Supine to sit: Mod assist, +2 for physical assistance General bed mobility comments: pt up in recliner    Transfers  Overall transfer  level: Needs assistance Equipment used: Rolling walker (2 wheeled) Transfers: Sit to/from Stand Sit to Stand: Min assist General transfer comment: Cues for technique-hand placement and to place LLE in front    Ambulation / Gait / Stairs / Wheelchair Mobility  Ambulation/Gait Ambulation/Gait assistance: Museum/gallery curator (Feet): 20 Feet Assistive device: Rolling walker (2 wheeled) General Gait Details: Needed less cues for sequencing today. Very fatigued after walking 74f. Took 1 standing rest break. C/o feelign hot and nauseated after walking Gait Pattern/deviations: Step-to pattern, Decreased stride length, Antalgic Gait velocity: decreased Gait velocity interpretation: Below normal speed for age/gender    Posture / Balance Balance Overall balance assessment: Needs assistance Sitting-balance support: No upper extremity supported, Feet supported Sitting balance-Leahy Scale: Fair Standing balance support: During functional activity Standing balance-Leahy Scale: Poor Standing balance comment: requires Both arms to balance using RW    Special needs/care consideration BiPAP/CPAP no CPM no  Continuous Drip IV no  Dialysis no         Life Vest no  Oxygen no  Special Bed no  Trach Size no  Wound Vac (area) no        Skin - pt has history of eczema and has some redness on her lower legs from the SCD's per pt report                               Bowel mgmt: last BM on 04-22-15 Bladder mgmt: currently using bedpan or BSC with assist Diabetic mgmt no   Previous Home Environment Living Arrangements: Spouse/significant other Available Help at Discharge: Family, Available PRN/intermittently Type of Home: House Home Layout: One level Home Access: Stairs to enter Entrance Stairs-Rails: Right, Left Entrance Stairs-Number of Steps: 3 Bathroom Shower/Tub: TOptometrist Yes Home Care Services: No  Discharge Living  Setting Plans for Discharge Living Setting: Patient's home, House Type of Home at Discharge: House Discharge Home Layout: One level Discharge Home Access: Stairs to enter Entrance Stairs-Rails: Right, Left Entrance Stairs-Number of Steps: 3 Discharge Bathroom Shower/Tub: Tub/shower unit Discharge Bathroom Toilet: Standard Does the patient have any problems obtaining your medications?: No  Social/Family/Support Systems Patient Roles: Spouse, Other (Comment) (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, 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: husband is primary caregiver Anticipated Caregiver: husband Anticipated CAmbulance personInformation: see above Ability/Limitations of Caregiver: no limitations Caregiver Availability: 24/7 Discharge Plan Discussed with Primary Caregiver: Yes (pt's husband is very supportive) Is Caregiver In Agreement with Plan?: Yes Does Caregiver/Family have Issues with Lodging/Transportation while Pt is in Rehab?: No  Goals/Additional Needs Patient/Family Goal for Rehab: Mod Ind with PT/OT; NA for SLP  Expected length of stay: 7-11 dyas Cultural Considerations: none Dietary Needs: gluten free, no soy Equipment Needs: to be determined Pt/Family Agrees to Admission and willing to participate: Yes Program Orientation Provided & Reviewed with Pt/Caregiver Including Roles  & Responsibilities: Yes   Decrease burden of Care through IP rehab admission: NA   Possible need for SNF placement upon discharge: not anticipated   Patient Condition: This patient's condition remains as documented in the consult dated 04-27-15, in which the Rehabilitation Physician determined and documented that the patient's condition is appropriate for intensive rehabilitative care in an inpatient rehabilitation facility. Will admit to inpatient rehab today.  Preadmission Screen Completed By:  Nanetta Batty, PT, 04/28/2015 2:37  PM ______________________________________________________________________   Discussed status with Dr. Naaman Plummer on 04-28-15 at 1437 and received telephone approval for admission today.  Admission Coordinator:  Nanetta Batty, PT, time 1437/Date 04-28-15

## 2015-04-28 NOTE — Progress Notes (Signed)
Patient ID: [name], female   DOB: [birth year], 64 y.o.   MRN: [id number] Patient admitted to 4M08 via bed, escorted by nursing staff and spouse.  Patient has been to rehab previously, verbalized understanding of rehab process, signed fall safety agreement.  Patient appears to be in no immediate distress at this time.  Will continue to monitor.  Brita Romp, RN

## 2015-04-28 NOTE — Progress Notes (Signed)
Rehab admissions - I have received insurance approval for inpatient rehab and spoke with Dr. Conley Canal who gave medical clearance for pt. We have an available rehab bed today and will admit to inpatient rehab later today.  I called and updated pt who was pleased with this plan. I will complete admission paperwork with pt shortly.  I called and updated Hilda Blades and Jackelyn Poling, case Publishing rights manager and Marshell Levan, social work.  Please call me with any questions. Thanks.  Nanetta Batty, PT Rehabilitation Admissions Coordinator [phone]

## 2015-04-29 ENCOUNTER — Inpatient Hospital Stay (HOSPITAL_COMMUNITY): Payer: 59 | Admitting: Occupational Therapy

## 2015-04-29 ENCOUNTER — Inpatient Hospital Stay (HOSPITAL_COMMUNITY): Payer: 59

## 2015-04-29 ENCOUNTER — Encounter (HOSPITAL_COMMUNITY): Payer: Self-pay | Admitting: Orthopedic Surgery

## 2015-04-29 DIAGNOSIS — N3000 Acute cystitis without hematuria: Secondary | ICD-10-CM

## 2015-04-29 LAB — CBC WITH DIFFERENTIAL/PLATELET
Basophils Absolute: 0.1 10*3/uL (ref 0.0–0.1)
Basophils Relative: 1 % (ref 0–1)
Eosinophils Absolute: 0.6 10*3/uL (ref 0.0–0.7)
Eosinophils Relative: 4 % (ref 0–5)
HEMATOCRIT: 28.4 % — AB (ref 36.0–46.0)
HEMOGLOBIN: 9 g/dL — AB (ref 12.0–15.0)
LYMPHS ABS: 2.5 10*3/uL (ref 0.7–4.0)
LYMPHS PCT: 17 % (ref 12–46)
MCH: 27.1 pg (ref 26.0–34.0)
MCHC: 31.7 g/dL (ref 30.0–36.0)
MCV: 85.5 fL (ref 78.0–100.0)
MONO ABS: 1.3 10*3/uL — AB (ref 0.1–1.0)
Monocytes Relative: 9 % (ref 3–12)
Neutro Abs: 10.1 10*3/uL — ABNORMAL HIGH (ref 1.7–7.7)
Neutrophils Relative %: 69 % (ref 43–77)
Platelets: 585 10*3/uL — ABNORMAL HIGH (ref 150–400)
RBC: 3.32 MIL/uL — ABNORMAL LOW (ref 3.87–5.11)
RDW: 14.3 % (ref 11.5–15.5)
Smear Review: INCREASED
WBC: 14.6 10*3/uL — ABNORMAL HIGH (ref 4.0–10.5)

## 2015-04-29 LAB — COMPREHENSIVE METABOLIC PANEL
ALT: 51 U/L (ref 14–54)
AST: 33 U/L (ref 15–41)
Albumin: 2.8 g/dL — ABNORMAL LOW (ref 3.5–5.0)
Alkaline Phosphatase: 88 U/L (ref 38–126)
Anion gap: 13 (ref 5–15)
BUN: 13 mg/dL (ref 6–20)
CO2: 24 mmol/L (ref 22–32)
Calcium: 9 mg/dL (ref 8.9–10.3)
Chloride: 95 mmol/L — ABNORMAL LOW (ref 101–111)
Creatinine, Ser: 0.8 mg/dL (ref 0.44–1.00)
GFR calc non Af Amer: >60 mL/min (ref >60)
GLUCOSE: 145 mg/dL — AB (ref 70–99)
Potassium: 4.1 mmol/L (ref 3.5–5.1)
SODIUM: 132 mmol/L — AB (ref 135–145)
TOTAL PROTEIN: 6.3 g/dL — AB (ref 6.5–8.1)
Total Bilirubin: 0.6 mg/dL (ref 0.3–1.2)

## 2015-04-29 MED ORDER — DICLOFENAC SODIUM 1 % TD GEL
2.0000 g | Freq: Four times a day (QID) | TRANSDERMAL | Status: DC
  Administered 2015-04-29 – 2015-05-04 (×20): 2 g via TOPICAL
  Filled 2015-04-29: qty 100

## 2015-04-29 NOTE — Care Management Note (Signed)
[facility] Individual Statement of Services  Patient Name:  Lashaya Kienitz  Date:  04/29/2015  Welcome to the [facility].  Our goal is to provide you with an individualized program based on your diagnosis and situation, designed to meet your specific needs.  With this comprehensive rehabilitation program, you will be expected to participate in at least 3 hours of rehabilitation therapies Monday-Friday, with modified therapy programming on the weekends.  Your rehabilitation program will include the following services:  Physical Therapy (PT), Occupational Therapy (OT), 24 hour per day rehabilitation nursing, Therapeutic Recreaction (TR), Case Management (Social Worker), Rehabilitation Medicine, Nutrition Services and Pharmacy Services  Weekly team conferences will be held on Wednesday to discuss your progress.  Your Social Worker will talk with you frequently to get your input and to update you on team discussions.  Team conferences with you and your family in attendance may also be held.  Expected length of stay: 7-10 days Overall anticipated outcome: mod/i level  Depending on your progress and recovery, your program may change. Your Social Worker will coordinate services and will keep you informed of any changes. Your Social Worker's name and contact numbers are listed  below.  The following services may also be recommended but are not provided by the [facility] will be made to provide these services after discharge if needed.  Arrangements include referral to agencies that provide these services.  Your insurance has been verified to be:  [facility] Your primary doctor is:  [facility]  Pertinent information will be shared with your doctor and your insurance company.  Social Worker:  Ovidio Kin, [place] or ([phone]  Information discussed with and copy given to patient by: Elease Hashimoto, 04/29/2015, 1:29 PM

## 2015-04-29 NOTE — Progress Notes (Signed)
[facility] Admission Coordinator Signed Physical Medicine and Rehabilitation PMR Pre-admission 04/28/2015 2:23 PM  Related encounter: ED to Hosp-Admission (Discharged) from 04/22/2015 in [place] All Collapse All   PMR Admission Coordinator Pre-Admission Assessment  Patient: [name] is an 64 y.o., female MRN: [id number] DOB: [birth year] Height: 5\' 1"  (154.9 cm) Weight: 87.181 kg (192 lb 3.2 oz)  Insurance Information  PRIMARY: [facility] Policy#: [id number] Subscriber: self CM Name: Inocencio Homes Phone#: [phone], ext. [phone] Fax#: [phone] Approval given from 04-28-15 through 05-05-15 with updates due to Judson Roch, RN at above fax. If pt is not DC'd on 05-05-15, then updates are due on 05-05-15 with phone update to Judson Roch as well. Pre-Cert#: [id number] Employer: working full time for [employer] Benefits: Phone #: [phone] Name: [name]. Date: 01-08-12 Deduct: $275 (met all) Out of Pocket Max: $5000 (met $1906.87) Life Max: none CIR: 90/10%, pre-auth needed SNF: not covered Outpatient: 90/10% Co-Pay: no copay, 60 day visit limit Home Health: 90/10% Co-Pay: none, 25 visits/year DME: 90/10% Co-Pay: none Providers: in network  Emergency Contact Information Contact Information    Name Relation Home Work Mobile   [place] Spouse   [phone]     Current Medical History  Patient Admitting Diagnosis: left peritrochanteric femur fx--->converted to left THA  History of Present Illness: Jahlia Omura is a 64 y.o. female with history of left femur fracture treated with IM Nailing 11/2014 (with CIR for therapy) who has had increase in pain in the hip for the past few weeks with difficulty walking. She was admitted  via ED on 04/22/15 with severe pain and X rays were done revealing hardware failure (nail breakage) and nonunion of the left peritroch fracture. She was started on narcotics for pain management and underwent conversion to L-THR by Dr. Alvan Dame on 04/24/15. Post op 50% WB on LLE and SCDs for DVT prophylaxis. Follow up labs with [facility] with drop in H/H to 7.4/26.4. She was febrile last pm with T- 102 and was noted to have elevated WBC -18.0 due to UTI.Inpatient rehab was recommended and pt was making good progress in therapy.   Past Medical History  Past Medical History  Diagnosis Date  . Chickenpox   . Genital warts   . Environmental allergies   . HTN (hypertension)   . Hyperlipidemia   . Thyroid disease   . Thyroid nodule   . Hypothyroidism   . Complication of anesthesia     "hard to wake up:  Marland Kitchen Family history of adverse reaction to anesthesia     "sister & mom are hard to wake up:  . Heart murmur   . Arthritis     Bilateral in the Knees and fingers (04/22/2015)    Family History  family history includes Arthritis in her mother; Diabetes in her mother; Heart disease in her father; High blood pressure in her father and mother; Hyperlipidemia in her mother; Myasthenia gravis in her mother; Stroke in her father.  Prior Rehab/Hospitalizations: pt was at [facility] in late Dec. 2015 - January 2016 after THA. She then had follow up home health services and also was most recently getting outpatient physical therapy.  Current Medications   Current facility-administered medications:  . 0.9 % sodium chloride infusion, , Intravenous, Once, Paralee Cancel, MD . acetaminophen (TYLENOL) tablet 650 mg, 650 mg, Oral, Q6H PRN, Geradine Girt, DO, 650 mg at 04/26/15 1641 . alum & mag hydroxide-simeth (MAALOX/MYLANTA) 200-200-20 MG/5ML suspension 30 mL,  30 mL, Oral, Q4H PRN, Danae Orleans, PA-C . bisacodyl (DULCOLAX) suppository 10 mg, 10 mg, Rectal, Daily  PRN, Danae Orleans, PA-C . diphenhydrAMINE (BENADRYL) capsule 25 mg, 25 mg, Oral, Q6H PRN, Danae Orleans, PA-C, 25 mg at 04/27/15 1204 . diphenhydrAMINE-zinc acetate (BENADRYL) 2-0.1 % cream, , Topical, BID PRN, Dianne Dun, NP . docusate sodium (COLACE) capsule 100 mg, 100 mg, Oral, BID, Samella Parr, NP, 100 mg at 04/27/15 1000 . enoxaparin (LOVENOX) injection 40 mg, 40 mg, Subcutaneous, Q24H, Matthew Babish, PA-C, 40 mg at 04/28/15 1058 . ferrous sulfate tablet 325 mg, 325 mg, Oral, TID PC, Samella Parr, NP, 325 mg at 04/28/15 1226 . HYDROcodone-acetaminophen (NORCO/VICODIN) 5-325 MG per tablet 1-2 tablet, 1-2 tablet, Oral, Q4H PRN, Danae Orleans, PA-C, 2 tablet at 04/28/15 1226 . HYDROmorphone (DILAUDID) injection 1 mg, 1 mg, Intravenous, Q2H PRN, Samella Parr, NP, 1 mg at 04/28/15 0547 . irbesartan (AVAPRO) tablet 37.5 mg, 37.5 mg, Oral, Daily, Jessica U Vann, DO, 37.5 mg at 04/28/15 1058 . levothyroxine (SYNTHROID, LEVOTHROID) tablet 88 mcg, 88 mcg, Oral, QAC breakfast, Samella Parr, NP, 88 mcg at 04/28/15 1057 . menthol-cetylpyridinium (CEPACOL) lozenge 3 mg, 1 lozenge, Oral, PRN **OR** phenol (CHLORASEPTIC) mouth spray 1 spray, 1 spray, Mouth/Throat, PRN, Danae Orleans, PA-C . metoCLOPramide (REGLAN) tablet 5-10 mg, 5-10 mg, Oral, Q8H PRN **OR** metoCLOPramide (REGLAN) injection 5-10 mg, 5-10 mg, Intravenous, Q8H PRN, Danae Orleans, PA-C . ondansetron (ZOFRAN) injection 4 mg, 4 mg, Intravenous, Q6H PRN, Tomi Bamberger Vann, DO, 4 mg at 04/23/15 1310 . polyethylene glycol (MIRALAX / GLYCOLAX) packet 17 g, 17 g, Oral, Daily PRN, Danae Orleans, PA-C . sulfamethoxazole-trimethoprim (BACTRIM DS,SEPTRA DS) 800-160 MG per tablet 1 tablet, 1 tablet, Oral, Q12H, Delfina Redwood, MD, 1 tablet at 04/28/15 1058 . Vitamin D (Ergocalciferol) (DRISDOL) capsule 50,000 Units, 50,000 Units, Oral, Once per day on Mon Wed Fri, Samella Parr, NP, 50,000 Units at  04/27/15 1159  Patients Current Diet: Diet gluten free Room service appropriate?: Yes; Fluid consistency:: Thin  Precautions / Restrictions Precautions Precautions: Posterior Hip Precaution Booklet Issued: Yes (comment) Precaution Comments: Reviewed hip precautions with pt and husband Restrictions Weight Bearing Restrictions: Yes LLE Weight Bearing: Partial weight bearing LLE Partial Weight Bearing Percentage or Pounds: 50 Other Position/Activity Restrictions: 50%   Prior Activity Level Community (5-7x/wk): Pt got out nearly everyday and 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 She enjoys arts and doing painting/drawing. Pt was going to outpt PT for further strengthening with her cane.   Home Assistive Devices / Equipment Home Assistive Devices/Equipment: Eyeglasses, Radio producer (specify quad or straight), Walker (specify type), Bedside commode/3-in-1, Hand-held shower hose Home Equipment: Walker - 2 wheels, Cane - single point, Bedside commode, Shower seat, Tub bench  Prior Functional Level Prior Function Level of Independence: Independent with assistive device(s)  Current Functional Level Cognition  Overall Cognitive Status: Within Functional Limits for tasks assessed Orientation Level: Oriented X4   Extremity Assessment (includes Sensation/Coordination)  Upper Extremity Assessment: Generalized weakness  Lower Extremity Assessment: Defer to PT evaluation LLE Deficits / Details: ROM and strength greatly limited in LLE    ADLs  Overall ADL's : Needs assistance/impaired Grooming: Wash/dry hands, Wash/dry face, Standing, Minimal assistance Upper Body Bathing: Supervision/ safety, Set up, Sitting Lower Body Bathing: Moderate assistance Upper Body Dressing : Supervision/safety, Set up, Sitting Lower Body Dressing: Maximal assistance, Adhering to hip precautions Toilet Transfer: Minimal assistance, Comfort height toilet, RW, Cueing for safety Toilet  Transfer Details (indicate cue type and reason): cues for hand placement Toileting- Clothing Manipulation and Hygiene: Minimal assistance, Sit to/from stand Functional mobility during ADLs: Minimal assistance, Cueing for safety    Mobility  Overal bed mobility: (NT, pt up in chair when PT arrived) Bed Mobility: Supine to Sit Supine to sit: Mod assist, +2 for physical assistance General bed mobility comments: pt up in recliner    Transfers  Overall transfer level: Needs assistance Equipment used: Rolling walker (2 wheeled) Transfers: Sit to/from Stand Sit to Stand: Min assist General transfer comment: Cues for technique-hand placement and to place LLE in front    Ambulation / Gait / Stairs / Wheelchair Mobility  Ambulation/Gait Ambulation/Gait assistance: Museum/gallery curator (Feet): 20 Feet Assistive device: Rolling walker (2 wheeled) General Gait Details: Needed less cues for sequencing today. Very fatigued after walking 81ft. Took 1 standing rest break. C/o feelign hot and nauseated after walking Gait Pattern/deviations: Step-to pattern, Decreased stride length, Antalgic Gait velocity: decreased Gait velocity interpretation: Below normal speed for age/gender    Posture / Balance Balance Overall balance assessment: Needs assistance Sitting-balance support: No upper extremity supported, Feet supported Sitting balance-Leahy Scale: Fair Standing balance support: During functional activity Standing balance-Leahy Scale: Poor Standing balance comment: requires Both arms to balance using RW    Special needs/care consideration BiPAP/CPAP no CPM no  Continuous Drip IV no  Dialysis no  Life Vest no  Oxygen no  Special Bed no  Trach Size no  Wound Vac (area) no  Skin - pt has history of eczema and has some redness on her lower legs from the SCD's per pt report  Bowel mgmt: last BM on 04-22-15 Bladder mgmt:  currently using bedpan or BSC with assist Diabetic mgmt no   Previous Home Environment Living Arrangements: Spouse/significant other Available Help at Discharge: Family, Available PRN/intermittently Type of Home: House Home Layout: One level Home Access: Stairs to enter Entrance Stairs-Rails: Right, Left Entrance Stairs-Number of Steps: 3 Bathroom Shower/Tub: Optometrist: Yes Home Care Services: No  Discharge Living Setting Plans for Discharge Living Setting: Patient's home, House Type of Home at Discharge: House Discharge Home Layout: One level Discharge Home Access: Stairs to enter Entrance Stairs-Rails: Right, Left Entrance Stairs-Number of Steps: 3 Discharge Bathroom Shower/Tub: Tub/shower unit Discharge Bathroom Toilet: Standard Does the patient have any problems obtaining your medications?: No  Social/Family/Support Systems Patient Roles: Spouse, Other (Comment) (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, 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 and exercise psychologist Information: husband is primary caregiver Anticipated Caregiver: husband Anticipated Ambulance person Information: see above Ability/Limitations of Caregiver: no limitations Caregiver Availability: 24/7 Discharge Plan Discussed with Primary Caregiver: Yes (pt's husband is very supportive) Is Caregiver In Agreement with Plan?: Yes Does Caregiver/Family have Issues with Lodging/Transportation while Pt is in Rehab?: No  Goals/Additional Needs Patient/Family Goal for Rehab: Mod Ind with PT/OT; NA for SLP Expected length of stay: 7-11 dyas Cultural Considerations: none Dietary Needs: gluten free, no soy Equipment Needs: to be determined Pt/Family Agrees to Admission and willing to participate: Yes Program Orientation Provided & Reviewed with Pt/Caregiver Including Roles & Responsibilities: Yes   Decrease burden of Care through IP rehab admission: NA   Possible need for SNF placement  upon discharge: not anticipated   Patient Condition: This patient's condition remains as documented in the consult dated 04-27-15, in which the Rehabilitation Physician determined and documented that the patient's condition is appropriate for intensive rehabilitative care in an inpatient rehabilitation  facility. Will admit to inpatient rehab today.  Preadmission Screen Completed By: Nanetta Batty, PT, 04/28/2015 2:37 PM ______________________________________________________________________  Discussed status with Dr. Naaman Plummer on 04-28-15 at 1437 and received telephone approval for admission today.  Admission Coordinator: Nanetta Batty, PT, time 1437/Date 04-28-15          Cosigned by: [name], MD at 04/28/2015 2:55 PM  Revision History     Date/Time User Provider Type Action   04/28/2015 2:55 PM [name], MD Physician Cosign   04/28/2015 2:54 PM [facility] Admission Coordinator Sign

## 2015-04-29 NOTE — Progress Notes (Signed)
Occupational Therapy Session Note  Patient Details  Name: [name] MRN: [id number] Date of Birth: [birth year]  Today's Date: 04/29/2015 OT Individual Time: 1401-1531 OT Individual Time Calculation (min): 90 min    Skilled Therapeutic Interventions/Progress Updates:    Pt began session in ADL apartment practicing bed mobility on regular elevated bed, which is what she has at home.  She was able to perform transfers in and out of bed with overall mod demonstrational cueing to maintain left hip precautions and min facilitation to place the LLE.  Husband present for session as well.  Educated both on use of a pillow between her legs to avoid internal rotation and adduction if she chooses to sleep on her right side.  Progressed to practicing with use of AE for LB dressing.  Pt was able to doff her shoe and sock as well as donn her sock using the sockaide on the RLE.  Husband reports that he may assist with LB dressing as well.  Discussed availability of items in the gift shop as well.  Transitioned to practicing tub/shower transfers using the RW and tub bench, similar to what she was already using at home.  Mod assist for lifting the LLE over the edge of the tub as well as mod demonstrational cueing for technique to adhere to her hip precautions.  Finished session by having her propel herself back to the room in the wheelchair and transfer to the recliner with min assist.   Therapy Documentation Precautions:  Precautions Precautions: Posterior Hip Precaution Booklet Issued: Yes (comment) Precaution Comments: Reviewed hip precautions with pt and husband; stated 2/3 Restrictions Weight Bearing Restrictions: Yes LLE Weight Bearing: Partial weight bearing LLE Partial Weight Bearing Percentage or Pounds: 50 %  Pain: Pain Assessment Pain Assessment: Faces Pain Score: 8  Faces Pain Scale: Hurts little more Pain Type: Acute pain Pain Location: Hip Pain Orientation: Left Pain Descriptors /  Indicators: Aching Pain Frequency: Intermittent Pain Onset: With Activity Patients Stated Pain Goal: 4 Pain Intervention(s): Repositioned;Medication (See eMAR) ADL: See FIM for current functional status  Therapy/Group: Individual Therapy  Jailon Schaible OTR/L 04/29/2015, 4:05 PM

## 2015-04-29 NOTE — Patient Care Conference (Signed)
Inpatient RehabilitationTeam Conference and Plan of Care Update Date: 04/30/2015   Time: 11;15 AM    Patient Name: [name]      Medical Record Number: [id number]  Date of Birth: 11/13/1951 Sex: Female         Room/Bed: 4M08C/4M08C-01 Payor Info: Payor: [employer] / Plan: Electrical engineer / Product Type: *No Product type* /    Admitting Diagnosis: failed l hip pinning  Admit Date/Time:  04/28/2015  4:56 PM Admission Comments: No comment available   Primary Diagnosis:  Closed left subtrochanteric femur fracture Principal Problem: Closed left subtrochanteric femur fracture  Patient Active Problem List   Diagnosis Date Noted  . Gluten intolerance 04/27/2015  . Postoperative anemia due to acute blood loss 04/27/2015  . Closed hip fracture 04/22/2015  . Leg edema, left 04/22/2015  . Acute blood loss anemia 12/23/2014  . Fracture, intertrochanteric, left femur 12/18/2014  . Fall   . Subtrochanteric fracture of femur 12/16/2014  . UTI (urinary tract infection) 12/16/2014  . Leukocytosis 12/16/2014  . Hip fracture requiring operative repair   . Closed left subtrochanteric femur fracture 12/15/2014  . Lesion of right humerus 12/15/2014  . Osteoporosis, unspecified 10/15/2013  . HTN (hypertension) 07/16/2013  . Hypothyroidism 07/16/2013  . Osteopenia 07/16/2013  . Environmental allergies 07/16/2013  . Obesity (BMI 30-39.9) 07/16/2013    Expected Discharge Date:    Team Members Present: Physician leading conference: Dr. Alysia Penna Social Worker Present: Ovidio Kin, LCSW Nurse Present: Dorien Chihuahua, RN PT Present: Cameron Sprang, PT;Rodney Lajuana Matte, PT OT Present: Benay Pillow, OT SLP Present: Windell Moulding, SLP PPS Coordinator present : Daiva Nakayama, RN, CRRN     Current Status/Progress Goal Weekly Team Focus  Medical   no severe pain except with movement , bowels doing ok  Home d/c, maintain hip prec  intiate rehab program   Bowel/Bladder   Continent of bowel and bladder;  LBM 5/3  Mod I  Assess for contipation and treat as needed   Swallow/Nutrition/ Hydration     na        ADL's   min assist overall, total with LB due to posterior hip precautions  Mod I  education on AE, activity tolerance   Mobility     min assist-working on strengthening and exercises   mod/i to supervision   education, activity tolerance and endurance  Communication     na        Safety/Cognition/ Behavioral Observations    no unsafe behaviors        Pain   C/o pain in left hip- vicodin 2 tabs q4h prn  < 3  Assess and treat for pain q shift and prn   Skin   Incision to left hip closed with dermabond; small serous filled blister under left buttocks, 2 small spots opened; rubbed area from ted hose  Mod I  Assess skin q shift and prn      *See Care Plan and progress notes for long and short-term goals.  Barriers to Discharge: 50% WB    Possible Resolutions to Barriers:  BUE strengthening    Discharge Planning/Teaching Needs:    Home with husband who can provide assistance, here last Dec and glad to be back.       Team Discussion:  Goals mod/i level, supportive husband, managing pain issues. Treating UTI.  Short length of stay-one week  Revisions to Treatment Plan:  New eval   Continued Need for Acute Rehabilitation Level of Care: The patient requires daily  medical management by a physician with specialized training in physical medicine and rehabilitation for the following conditions: Daily direction of a multidisciplinary physical rehabilitation program to ensure safe treatment while eliciting the highest outcome that is of practical value to the patient.: Yes Daily medical management of patient stability for increased activity during participation in an intensive rehabilitation regime.: Yes Daily analysis of laboratory values and/or radiology reports with any subsequent need for medication adjustment of medical intervention for : Other;Post surgical problems  [name],  [name] 05/01/2015, 8:59 AM

## 2015-04-29 NOTE — Progress Notes (Signed)
Social Work Assessment and Plan Social Work Assessment and Plan  Patient Details  Name: [name] MRN: [id number] Date of Birth: 1951-01-02  Today's Date: 04/29/2015  Problem List:  Patient Active Problem List   Diagnosis Date Noted  . Gluten intolerance 04/27/2015  . Postoperative anemia due to acute blood loss 04/27/2015  . Closed hip fracture 04/22/2015  . Leg edema, left 04/22/2015  . Acute blood loss anemia 12/23/2014  . Fracture, intertrochanteric, left femur 12/18/2014  . Fall   . Subtrochanteric fracture of femur 12/16/2014  . UTI (urinary tract infection) 12/16/2014  . Leukocytosis 12/16/2014  . Hip fracture requiring operative repair   . Closed left subtrochanteric femur fracture 12/15/2014  . Lesion of right humerus 12/15/2014  . Osteoporosis, unspecified 10/15/2013  . HTN (hypertension) 07/16/2013  . Hypothyroidism 07/16/2013  . Osteopenia 07/16/2013  . Environmental allergies 07/16/2013  . Obesity (BMI 30-39.9) 07/16/2013   Past Medical History:  Past Medical History  Diagnosis Date  . Chickenpox   . Genital warts   . Environmental allergies   . HTN (hypertension)   . Hyperlipidemia   . Thyroid disease   . Thyroid nodule   . Hypothyroidism   . Complication of anesthesia     "hard to wake up:  Marland Kitchen Family history of adverse reaction to anesthesia     "sister & mom are hard to wake up:  . Heart murmur   . 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 Carpal tunnel release Bilateral 1990's  . Cystectomy      From the cervix  . Intramedullary (im) nail intertrochanteric Left 12/15/2014    Procedure: INTRAMEDULLARY (IM) NAIL INTERTROCHANTRIC;  Surgeon: Augustin Schooling, MD;  Location: [facility];  Service: Orthopedics;  Laterality: Left;  . Fracture surgery    . Total hip arthroplasty Left 04/24/2015    Procedure:  TOTAL HIP ARTHROPLASTY AND REMOVAL OF IM AFFIXUS  NAIL ;  Surgeon: Paralee Cancel, MD;  Location: [facility];  Service: Orthopedics;  Laterality: Left;   Social History:  reports that she has never smoked. She has never used smokeless tobacco. She reports that she drinks alcohol. She reports that she does not use illicit drugs.  Family / Support Systems Marital Status: Married Patient Roles: Spouse, Other (Comment) (employee) Spouse/Significant Other: Jori Moll [phone] Children: none Other Supports: friends Anticipated Caregiver: husband Ability/Limitations of Caregiver: healthy and no limitations Caregiver Availability: 24/7 Family Dynamics: Very close relationship with husband who is here daily and spends a great deal of time in therapies with her also.  Both are very reliant upon one another.  He helped her through the last admission which failed, accroding to both of them.  Social History Preferred language: English Religion: Jewish Cultural Background: No issues Education: Data processing manager: Yes Write: Yes Employment Status: Employed Name of Employer: [employer]-works from home Return to Work Plans: Plans to return when able Freight forwarder Issues: no issues Guardian/Conservator: None-according to MD pt is capable of making her own decisions while here.   Abuse/Neglect Physical Abuse: Denies Verbal Abuse: Denies Sexual Abuse: Denies Exploitation of patient/patient's resources: Denies Self-Neglect: Denies  Emotional Status Pt's affect, behavior adn adjustment status: Pt is familiar wiht rehab unit since was here last Dec and now back again after the Im nailing did not work.  She wishes she had the THR in the first place and would be recvoered from  all of this. Husband chimes in and also wishes it was a success instead of months of recovery and to no avail.  Pt is motivated to workin therapies and regain her independence. Recent Psychosocial Issues: Other health issues-last hospitalization in Dec 2015 Pyschiatric History: No  history deferred depression screen due to tp doing well with this surgery and feels well about her recovery this time.  Will monitor but will be short length of stay. Substance Abuse History: No issues  Patient / Family Perceptions, Expectations & Goals Pt/Family understanding of illness & functional limitations: Pt and husband have a good understnaidng of the treatment plan and the process.  Both speka wiht the MD 's rounding and feel they are being heard and questions are being addressed. 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, employee, home owner, freind, etc Anticipated changes in roles/activities/participation: resume Pt/family expectations/goals: Pt states: " I already feel better about this time than before, I will get there."  Husband states: " I wish she would of have this the first time, instead of going through it again."  [employer]: None Premorbid Home Care/DME Agencies: Other (Comment) (had in past HH and OP) Transportation available at discharge: husband  Discharge Planning Living Arrangements: Spouse/significant other Support Systems: Spouse/significant other, Friends/neighbors, Social worker community Type of Residence: Private residence Insurance Resources: Multimedia programmer (specify) Psychologist, counselling) Financial Resources: Employment, Secondary school teacher Screen Referred: No Living Expenses: Lives with family Money Management: Spouse, Patient Does the patient have any problems obtaining your medications?: No Home Management: Both, husband was doing the home management due to pt unable to, was still recovering from her first surgery. Patient/Family Preliminary Plans: Return home with husband who can assist, like before.  They have all equipment except wheelchair, sent it back.  Want to go to [facility] OP since feel making good progress there.  Aware will be short length of stay here. Social Work Anticipated Follow Up Needs: HH/OP  Clinical  Impression Very pleasant former pat who is well known to rehab since was here last Dec.  Both she and husband are glad to be back and are aware it will be short this time.  Pt is moving much better than last time. Husband is here daily and participates in therapies with pt.  Will set up for OP upon discharge and follow along with them.  Elease Hashimoto 04/29/2015, 1:50 PM

## 2015-04-29 NOTE — Progress Notes (Signed)
[name], MD Physician Signed Physical Medicine and Rehabilitation Consult Note 04/27/2015 1:06 PM  Related encounter: ED to Hosp-Admission (Discharged) from 04/22/2015 in [facility]        Physical Medicine and Rehabilitation Consult  Reason for Consult: Failed left hip pinning Referring Physician: Dr. Conley Canal   HPI: [name] is a 64 y.o. female with history of left femur fracture treated with IM Nailing 11/2014 (with CIR for therapy) who has had increase in pain in the hip for the past few weeks with difficulty walking. 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 She was started on narcotics for pain management and underwent conversion to L-THR by Dr. Alvan Dame on 04/24/15. Post op 50% WB on LLE and SCDs for DVT prophylaxis. Follow up labs with ABLA with drop in H/H to 7.4/26.4. She was febrile last pm with T- 102 and was noted to have elevated WBC -18.0 due to UTI?    Review of Systems  HENT: Negative for hearing loss.  Eyes: Negative for blurred vision.  Respiratory: Negative for cough and shortness of breath.  Gastrointestinal: Negative for heartburn and nausea.  Genitourinary: Negative for urgency and frequency.  Musculoskeletal: Positive for myalgias and joint pain.  Neurological: Negative for dizziness, tingling and headaches.  Psychiatric/Behavioral: The patient does not have insomnia.      Past Medical History  Diagnosis Date  . Chickenpox   . Genital warts   . Environmental allergies   . HTN (hypertension)   . Hyperlipidemia   . Thyroid disease   . Thyroid nodule   . Hypothyroidism   . Complication of anesthesia     "hard to wake up:  Marland Kitchen Family history of adverse reaction to anesthesia     "sister & mom are hard to wake up:  . Heart  murmur   . 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 Carpal tunnel release Bilateral 1990's  . Cystectomy      From the cervix  . Intramedullary (im) nail intertrochanteric Left 12/15/2014    Procedure: INTRAMEDULLARY (IM) NAIL INTERTROCHANTRIC; Surgeon: Augustin Schooling, MD; Location: [facility]; Service: Orthopedics; Laterality: Left;  . Fracture surgery      Family History  Problem Relation Age of Onset  . Arthritis Mother   . Hyperlipidemia Mother   . Heart disease Father   . Stroke Father     x's 2  . High blood pressure Father   . High blood pressure Mother   . Diabetes Mother   . Myasthenia gravis Mother     Social History: Married. Independent with cane PTA due to hip surgery. 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 She reports that she has never smoked. She has never used smokeless tobacco. She reports that she drinks alcohol on occasion. She reports that she does not use illicit drugs.    Allergies  Allergen Reactions  . Gluten Meal   . Statins Diarrhea  . Eggs Or Egg-Derived Products Diarrhea and Rash    Medications Prior to Admission  Medication Sig Dispense Refill  . azelastine (ASTELIN) 0.1 % nasal spray Place 1 spray into both nostrils 2 (two) times daily. Use in each nostril as directed 90 mL 3  .  Cyanocobalamin (VITAMIN B-12 PO) Take 1 tablet by mouth daily.    . fluticasone (FLONASE) 50 MCG/ACT nasal spray Place 2 sprays into both nostrils daily. 48 g 3  . HYDROcodone-acetaminophen (NORCO/VICODIN) 5-325 MG per tablet Take 1-2 tablets by mouth every 6 (six) hours as needed for moderate pain. 90 tablet 0  . levothyroxine (SYNTHROID) 88 MCG tablet Take 1 tablet (88 mcg total) by mouth daily. 90 tablet 3  . valsartan (DIOVAN) 320 MG tablet TAKE  1 TABLET DAILY 90 tablet 3  . Vitamin D, Ergocalciferol, (DRISDOL) 50000 UNITS CAPS capsule Take 50,000 Units by mouth 3 (three) times a week.    Marland Kitchen acetaminophen (TYLENOL) 325 MG tablet Take 2 tablets (650 mg total) by mouth every 6 (six) hours as needed. (Patient not taking: Reported on 04/22/2015) 60 tablet 1  . methocarbamol (ROBAXIN) 750 MG tablet Take 1 tablet (750 mg total) by mouth every 6 (six) hours as needed for muscle spasms. (Patient not taking: Reported on 04/22/2015) 90 tablet 0    Home: Home Living Family/patient expects to be discharged to:: Private residence Living Arrangements: Spouse/significant other Available Help at Discharge: Family, Available PRN/intermittently Type of Home: House Home Access: Stairs to enter Technical brewer of Steps: 3 Entrance Stairs-Rails: Right, Left Home Layout: One level Home Equipment: Environmental consultant - 2 wheels, Cane - single point, Bedside commode, Shower seat  Functional History: Prior Function Level of Independence: Independent with assistive device(s) Functional Status:  Mobility: Bed Mobility Overal bed mobility: Needs Assistance Bed Mobility: Supine to Sit Supine to sit: Mod assist, +2 for physical assistance General bed mobility comments: Verbal and physical cues for safest technique Transfers Overall transfer level: Needs assistance Equipment used: Rolling walker (2 wheeled) Transfers: Sit to/from Stand Sit to Stand: Mod assist, +2 physical assistance General transfer comment: Cues for technique Ambulation/Gait Ambulation/Gait assistance: Mod assist, +2 safety/equipment Ambulation Distance (Feet): 6 Feet Assistive device: Rolling walker (2 wheeled) General Gait Details: pt accepting very little weight through LLE despite cues that it was ok to WB 50% Gait Pattern/deviations: Step-to pattern, Decreased stride length, Antalgic Gait velocity: decreased Gait velocity interpretation: Below normal speed for  age/gender    ADL:    Cognition: Cognition Overall Cognitive Status: Within Functional Limits for tasks assessed Orientation Level: Oriented X4 Cognition Arousal/Alertness: Awake/alert Behavior During Therapy: WFL for tasks assessed/performed Overall Cognitive Status: Within Functional Limits for tasks assessed  Blood pressure 139/57, pulse 106, temperature 100.8 F (38.2 C), temperature source Oral, resp. rate 17, height 5\' 1"  (1.549 m), weight 87.181 kg (192 lb 3.2 oz), SpO2 98 %. Physical Exam  Nursing note and vitals reviewed. Constitutional: She is oriented to person, place, and time. She appears well-developed and well-nourished.  HENT:  Head: Normocephalic and atraumatic.  Eyes: Conjunctivae are normal. Pupils are equal, round, and reactive to light.  Neck: Normal range of motion. Neck supple.  Cardiovascular: Normal rate and regular rhythm.  Respiratory: Effort normal and breath sounds normal. No respiratory distress. She has no wheezes.  GI: Soft. Bowel sounds are normal. She exhibits no distension. There is no tenderness.  Musculoskeletal: She exhibits edema.  Min edema left hip. Multiple dry dressings on hip.  Neurological: She is alert and oriented to person, place, and time.  Speech clear. Follows commands without difficulty. UE's 4/5. LLE: 1/5 prox to 3/5 distally. RLE: 3/5 to 4/5 prox to distal.  Skin: Skin is warm and dry.  Psychiatric: She has a normal mood and affect. Her behavior is normal. Judgment  and thought content normal.     Lab Results Last 24 Hours    Results for orders placed or performed during the hospital encounter of 04/22/15 (from the past 24 hour(s))  Culture, blood (routine x 2) Status: None (Preliminary result)   Collection Time: 04/26/15 7:30 PM  Result Value Ref Range   Specimen Description BLOOD RIGHT ARM    Special Requests BOTTLES DRAWN AEROBIC AND ANAEROBIC 5CC    Culture       BLOOD CULTURE  RECEIVED NO GROWTH TO DATE CULTURE WILL BE HELD FOR 5 DAYS BEFORE ISSUING A FINAL NEGATIVE REPORT Performed at [facility]    Report Status PENDING   Urinalysis, Routine w reflex microscopic Status: Abnormal   Collection Time: 04/26/15 11:37 PM  Result Value Ref Range   Color, Urine YELLOW YELLOW   APPearance CLOUDY (A) CLEAR   Specific Gravity, Urine 1.019 1.005 - 1.030   pH 6.5 5.0 - 8.0   Glucose, UA NEGATIVE NEGATIVE mg/dL   Hgb urine dipstick NEGATIVE NEGATIVE   Bilirubin Urine NEGATIVE NEGATIVE   Ketones, ur NEGATIVE NEGATIVE mg/dL   Protein, ur NEGATIVE NEGATIVE mg/dL   Urobilinogen, UA 1.0 0.0 - 1.0 mg/dL   Nitrite NEGATIVE NEGATIVE   Leukocytes, UA MODERATE (A) NEGATIVE  Urine microscopic-add on Status: Abnormal   Collection Time: 04/26/15 11:37 PM  Result Value Ref Range   Squamous Epithelial / LPF MANY (A) RARE   WBC, UA TOO NUMEROUS TO COUNT <3 WBC/hpf   Bacteria, UA RARE RARE  CBC Status: Abnormal   Collection Time: 04/27/15 5:48 AM  Result Value Ref Range   WBC 18.0 (H) 4.0 - 10.5 K/uL   RBC 2.93 (L) 3.87 - 5.11 MIL/uL   Hemoglobin 8.2 (L) 12.0 - 15.0 g/dL   HCT 24.5 (L) 36.0 - 46.0 %   MCV 83.6 78.0 - 100.0 fL   MCH 28.0 26.0 - 34.0 pg   MCHC 33.5 30.0 - 36.0 g/dL   RDW 14.0 11.5 - 15.5 %   Platelets 303 150 - 400 K/uL  Basic metabolic panel Status: Abnormal   Collection Time: 04/27/15 5:48 AM  Result Value Ref Range   Sodium 133 (L) 135 - 145 mmol/L   Potassium 3.9 3.5 - 5.1 mmol/L   Chloride 100 (L) 101 - 111 mmol/L   CO2 27 22 - 32 mmol/L   Glucose, Bld 154 (H) 70 - 99 mg/dL   BUN 9 6 - 20 mg/dL   Creatinine, Ser 0.62 0.44 - 1.00 mg/dL   Calcium 8.5 (L) 8.9 - 10.3 mg/dL   GFR calc non Af Amer >60 >60 mL/min   GFR calc Af Amer >60 >60 mL/min   Anion gap 6 5 -  15      Imaging Results (Last 48 hours)    Dg Chest Port 1 View  04/26/2015 CLINICAL DATA: Low-grade fever for 2 days EXAM: PORTABLE CHEST - 1 VIEW COMPARISON: 04/22/2015 FINDINGS: Heart size and vascular pattern normal. No infiltrate or effusion. Partial visualization of extensive sclerotic lesion right humeral head neck and shaft. As described on report of prior study this is consistent with an enchondroma and would require further evaluation if there is pain associated with it. IMPRESSION: No acute findings. Electronically Signed By: Skipper Cliche M.D. On: 04/26/2015 17:40     Assessment/Plan: Diagnosis: left peritrochanteric femur fx--->converted to left THA 1. Does the need for close, 24 hr/day medical supervision in concert with the patient's rehab needs make it unreasonable for  this patient to be served in a less intensive setting? Yes 2. Co-Morbidities requiring supervision/potential complications: htn, osteoporosis, ABLA 3. Due to bladder management, bowel management, safety, skin/wound care, disease management, medication administration, pain management and patient education, does the patient require 24 hr/day rehab nursing? Yes 4. Does the patient require coordinated care of a physician, rehab nurse, PT (1-2 hrs/day, 5 days/week) and OT (1-2 hrs/day, 5 days/week) to address physical and functional deficits in the context of the above medical diagnosis(es)? Yes Addressing deficits in the following areas: balance, endurance, locomotion, strength, transferring, bowel/bladder control, bathing, dressing, feeding, grooming, toileting and psychosocial support 5. Can the patient actively participate in an intensive therapy program of at least 3 hrs of therapy per day at least 5 days per week? Yes 6. The potential for patient to make measurable gains while on inpatient rehab is excellent 7. Anticipated functional outcomes upon discharge from inpatient rehab are modified  independent with PT, modified independent with OT, n/a with SLP. 8. Estimated rehab length of stay to reach the above functional goals is: 7-11 days 9. Does the patient have adequate social supports and living environment to accommodate these discharge functional goals? Yes 10. Anticipated D/C setting: Home 11. Anticipated post D/C treatments: HH therapy and Outpatient therapy 12. Overall Rehab/Functional Prognosis: excellent  RECOMMENDATIONS: This patient's condition is appropriate for continued rehabilitative care in the following setting: CIR Patient has agreed to participate in recommended program. Yes Note that insurance prior authorization may be required for reimbursement for recommended care.  Comment: Rehab Admissions Coordinator to follow up. Pt did very well with [place] during her last stay. Would expect the same again.   Thanks,  [name], MD, Mellody Drown     04/27/2015       Revision History     Date/Time User Provider Type Action   04/27/2015 4:21 PM [name], MD Physician Sign   04/27/2015 4:04 PM Bary Leriche, PA-C Physician Assistant Share   View Details Report       Routing History     Date/Time From To Method   04/27/2015 4:21 PM [name], MD [name], MD In Basket   04/27/2015 4:21 PM [name], MD Crist Infante, MD Fax

## 2015-04-29 NOTE — Evaluation (Addendum)
Physical Therapy Assessment and Plan  Patient Details  Name: [name] MRN: [id number] Date of Birth: January 24, 1951  PT Diagnosis: Difficulty walking, Dizziness and giddiness and Osteoarthritis Rehab Potential: Good ELOS: 7-10   Today's Date: 04/29/2015 PT Individual Time: 1300-1400 PT Individual Time Calculation (min): 60 min    Problem List:  Patient Active Problem List   Diagnosis Date Noted  . Gluten intolerance 04/27/2015  . Postoperative anemia due to acute blood loss 04/27/2015  . Closed hip fracture 04/22/2015  . Leg edema, left 04/22/2015  . Acute blood loss anemia 12/23/2014  . Fracture, intertrochanteric, left femur 12/18/2014  . Fall   . Subtrochanteric fracture of femur 12/16/2014  . UTI (urinary tract infection) 12/16/2014  . Leukocytosis 12/16/2014  . Hip fracture requiring operative repair   . Closed left subtrochanteric femur fracture 12/15/2014  . Lesion of right humerus 12/15/2014  . Osteoporosis, unspecified 10/15/2013  . HTN (hypertension) 07/16/2013  . Hypothyroidism 07/16/2013  . Osteopenia 07/16/2013  . Environmental allergies 07/16/2013  . Obesity (BMI 30-39.9) 07/16/2013    Past Medical History:  Past Medical History  Diagnosis Date  . Chickenpox   . Genital warts   . Environmental allergies   . HTN (hypertension)   . Hyperlipidemia   . Thyroid disease   . Thyroid nodule   . Hypothyroidism   . Complication of anesthesia     "hard to wake up:  Marland Kitchen Family history of adverse reaction to anesthesia     "sister & mom are hard to wake up:  . Heart murmur   . 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 Carpal tunnel release Bilateral 1990's  . Cystectomy      From the cervix  . Intramedullary (im) nail intertrochanteric Left 12/15/2014    Procedure: INTRAMEDULLARY (IM) NAIL INTERTROCHANTRIC;  Surgeon: Augustin Schooling, MD;  Location: [facility];   Service: Orthopedics;  Laterality: Left;  . Fracture surgery    . Total hip arthroplasty Left 04/24/2015    Procedure:  TOTAL HIP ARTHROPLASTY AND REMOVAL OF IM AFFIXUS NAIL ;  Surgeon: Paralee Cancel, MD;  Location: [facility];  Service: Orthopedics;  Laterality: Left;    Assessment & Plan Clinical Impression:  [name] is a 64 y.o. female with history of left femur fracture treated with IM Nailing 11/2014 (with CIR for therapy) who has had increase in pain in the hip for the past few weeks with difficulty walking. 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 She was started on narcotics for pain management and underwent conversion to L-THR by Dr. Alvan Dame on 04/24/15. Post op 50% WB on LLE and SCDs for DVT prophylaxis. Patient transferred to CIR on 04/28/2015 .   Patient currently requires mod with mobility secondary to muscle weakness and decreased cardiorespiratoy endurance.  Prior to hospitalization, patient was modified independent . PTA, pt was using SPC indoors, RW outdoors; she lived with Spouse in a House home.  Home access is 3Stairs to enter, 2 rails but can only reach 1 at a time.  Patient will benefit from skilled PT intervention to maximize safe functional mobility, minimize fall risk and decrease caregiver burden for planned discharge home with intermittent assist.  Anticipate patient will benefit from follow up [facility] at discharge.  PT - End  of Session Activity Tolerance: Tolerates 10 - 20 min activity with multiple rests Endurance Deficit: Yes Endurance Deficit Description: dizzy, DOE after gait and steps PT Assessment Rehab Potential (ACUTE/IP ONLY): Good PT Patient demonstrates impairments in the following area(s): Balance;Edema;Endurance;Motor PT Transfers Functional Problem(s): Bed Mobility;Bed to Chair;Car;Furniture PT Locomotion Functional Problem(s):  Ambulation;Wheelchair Mobility;Stairs PT Plan PT Intensity: Minimum of 1-2 x/day ,45 to 90 minutes PT Frequency: 5 out of 7 days PT Duration Estimated Length of Stay: 7-10 PT Treatment/Interventions: Ambulation/gait training;Balance/vestibular training;Discharge planning;Community reintegration;DME/adaptive equipment instruction;Functional mobility training;Patient/family education;Neuromuscular re-education;Pain management;Psychosocial support;Therapeutic Exercise;Therapeutic Activities;Stair training;UE/LE Strength taining/ROM;UE/LE Coordination activities;Wheelchair propulsion/positioning PT Transfers Anticipated Outcome(s): modified independent basic, min assist car PT Locomotion Anticipated Outcome(s): supervision controlled env x 150; modified independent home env x 50'; supervision up/down 5 steps 1 rail PT Recommendation Follow Up Recommendations: Home health PT Patient destination: Home Equipment Recommended: Wheelchair (measurements);Wheelchair cushion (measurements);To be determined Equipment Details: may need w/c for community access  Skilled Therapeutic Intervention tx 1: pt able to state 1/3 THR hip precautions, and 50% wt bearing LLE.  PT reviewed and demonstrated hip precautions throughout session. By end of session, pt able to state 3/3 precautions and 50% wt bearing.   Pt unable to tolerate TEDs due to skin breakdown R calf where TED has abraded skin slightly; consulted RN. Gait with RW x 20' including 2 turns to L, with min assist.  Up/down 5 steps, step -to method, bil rails. Pt anxious about dizziness/DOE; improved with cues for slow deep breathing.  PT educated pt on step- through technique for gait with RW to optimize efficient gait. W/c propulsion x 180' for activity tolerance; foot rests lowered for comfort and function. PT requested husband measure bed ht at home; they state it is high, so pt may need a step stool for safety to stand> sit on bed at home.  Pt handed off to  OT for next tx session.  PT Evaluation Precautions/Restrictions Precautions Precautions: Posterior Hip Precaution Booklet Issued: Yes (comment) Precaution Comments: Reviewed hip precautions with pt ;stated 1/3 Restrictions Weight Bearing Restrictions: Yes LLE Weight Bearing: Partial weight bearing LLE Partial Weight Bearing Percentage or Pounds: 50 % General   Vital SignsTherapy Vitals BP: (!) 127/51 mmHg Patient Position (if appropriate): Sitting before gait Oxygen Therapy SpO2: 98 % O2 Device: Not Delivered Pain Pain Assessment Pain Assessment: Faces Pain Score:1 Pain Type: Acute pain Pain Location: Hip Pain Orientation: Left Pain Descriptors / Indicators: Aching Pain Frequency: Intermittent Pain Onset: With Activity Patients Stated Pain Goal: 4 Home Living/Prior Functioning Home Living Living Arrangements: Spouse/significant other Available Help at Discharge: Family;Available PRN/intermittently Type of Home: House Home Access: Stairs to enter [employer] of Steps: 3 Entrance Stairs-Rails: Right;Left, cannot reach both at same time Home Layout: One level  Lives With: Spouse Prior Function Level of Independence: Requires assistive device for independence  Able to Take Stairs?: Yes Driving: No Vocation: Full time employment (works from home; currently using sick leave) Leisure: Hobbies-yes (Comment) (enjoys arts and crafts) Comments: use of cane in home and RW outside on grass or in community Vision/Perception - wears glasses all the time; no change from baseline    Cognition Overall Cognitive Status: Within Functional Limits for tasks assessed Arousal/Alertness: Awake/alert Orientation Level: Oriented to person;Oriented to place;Oriented to time;Oriented to situation Attention: Selective Safety/Judgment: Appears intact Sensation Sensation Light Touch: Appears Intact Proprioception: Appears Intact Coordination Gross Motor Movements are Fluid and  Coordinated: No Fine Motor Movements are Fluid and Coordinated: No Heel Shin Test:  NT Motor  Motor Motor - Skilled Clinical Observations: generalized weakness L hip  Mobility Bed Mobility Bed Mobility: Not assessed Transfers Transfers: Yes Stand Pivot Transfers: 3: Mod assist Stand Pivot Transfer Details: Verbal cues for sequencing;Verbal cues for technique;Verbal cues for precautions/safety Stand Pivot Transfer Details (indicate cue type and reason): lowering assistance Locomotion  Ambulation Ambulation: Yes Ambulation/Gait Assistance: 4: Min assist Ambulation Distance (Feet): 20 Feet Assistive device: Rolling walker Ambulation/Gait Assistance Details: Verbal cues for gait pattern;Visual cues/gestures for precautions/safety;Visual cues/gestures for sequencing;Verbal cues for safe use of DME/AE;Verbal cues for technique Gait Gait: Yes Gait Pattern: Impaired Gait Pattern: Wide base of support;Trunk flexed;Decreased weight shift to left;Step-to pattern;Decreased hip/knee flexion - left;Decreased stance time - left;Abducted - left Gait velocity: decreased  Stairs / Additional Locomotion Stairs: Yes Stairs Assistance: 4: Min assist Stairs Assistance Details: Verbal cues for technique;Verbal cues for sequencing;Verbal cues for precautions/safety Stair Management Technique: Two Oceanographer: Yes Wheelchair Assistance: 5: Careers information officer: Both upper extremities Wheelchair Parts Management: Needs assistance Distance: 180  Trunk/Postural Assessment  Cervical Assessment Cervical Assessment: Within Functional Limits Thoracic Assessment Thoracic Assessment: Within Functional Limits Lumbar Assessment Lumbar Assessment: Exceptions to WFL (decreased wt bearing L hip) Postural Control Postural Control: Deficits on evaluation Postural Limitations: significant trunk flexion in standing; pt stated this was the case PTA   Balance Balance Balance Assessed: Yes Static Sitting Balance Static Sitting - Balance Support: Feet supported (modifiied independent) Dynamic Sitting Balance Dynamic Sitting - Balance Support: Feet supported (stand by assistance) Static Standing Balance Static Standing - Balance Support: Bilateral upper extremity supported (min guard assist) Dynamic Standing Balance Dynamic Standing - Balance Support: Bilateral upper extremity supported (with RW, min assist) Extremity Assessment      RLE Assessment RLE Assessment: Within Functional Limits (strength SFLS) LLE Assessment LLE Assessment: Exceptions to [facility] (moderate-severe edema hip/thigh ; strength grossly in sitting: hip NT, knee ext >4/5; ankle DF 4+/5; minimal resistance give)  FIM:  FIM - Bed/Chair Transfer Bed/Chair Transfer: 3: Chair or W/C > Bed: Mod A (lift or lower assist);3: Bed > Chair or W/C: Mod A (lift or lower assist) FIM - Locomotion: Wheelchair Distance: 180 Locomotion: Wheelchair: 5: Travels 150 ft or more: maneuvers on rugs and over door sills with supervision, cueing or coaxing FIM - Locomotion: Ambulation Locomotion: Ambulation Assistive Devices: Administrator Ambulation/Gait Assistance: 4: Min assist Locomotion: Ambulation: 1: Travels less than 50 ft with minimal assistance (Pt.>75%) FIM - Locomotion: Stairs Locomotion: Scientist, physiological: Hand rail - 2 Locomotion: Stairs: 2: Up and Down 4 - 11 stairs with minimal assistance (Pt.>75%)   Refer to Care Plan for Long Term Goals  Recommendations for other services: None  Discharge Criteria: Patient will be discharged from PT if patient refuses treatment 3 consecutive times without medical reason, if treatment goals not met, if there is a change in medical status, if patient makes no progress towards goals or if patient is discharged from hospital.  The above assessment, treatment plan, treatment alternatives and goals were discussed and mutually agreed  upon: by patient  Luverna Degenhart 04/29/2015, 4:55 PM

## 2015-04-29 NOTE — Progress Notes (Signed)
64 y.o. female with history of left femur fracture treated with IM Nailing 11/2014 (with CIR for therapy) who has had increase in pain in the hip for the past few weeks with difficulty walking. 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 She was started on narcotics for pain management and underwent conversion to L-THR by Dr. Alvan Dame on 04/24/15. Post op 50% WB on LLE   Subjective/Complaints: Has KInee arthritis on L>R Hip pain doing ok   Objective: Vital Signs: Blood pressure 156/58, pulse 86, temperature 98.1 F (36.7 C), temperature source Oral, resp. rate 17, height $RemoveBe'5\' 1"'XzporBJyp$  (1.549 m), weight 89.6 kg (197 lb 8.5 oz), SpO2 100 %. No results found. Results for orders placed or performed during the hospital encounter of 04/22/15 (from the past 72 hour(s))  Culture, blood (routine x 2)     Status: None (Preliminary result)   Collection Time: 04/26/15  7:30 PM  Result Value Ref Range   Specimen Description BLOOD RIGHT ARM    Special Requests BOTTLES DRAWN AEROBIC AND ANAEROBIC 5CC    Culture             BLOOD CULTURE RECEIVED NO GROWTH TO DATE CULTURE WILL BE HELD FOR 5 DAYS BEFORE ISSUING A FINAL NEGATIVE REPORT Performed at [facility]    Report Status PENDING   Urinalysis, Routine w reflex microscopic     Status: Abnormal   Collection Time: 04/26/15 11:37 PM  Result Value Ref Range   Color, Urine YELLOW YELLOW   APPearance CLOUDY (A) CLEAR   Specific Gravity, Urine 1.019 1.005 - 1.030   pH 6.5 5.0 - 8.0   Glucose, UA NEGATIVE NEGATIVE mg/dL   Hgb urine dipstick NEGATIVE NEGATIVE   Bilirubin Urine NEGATIVE NEGATIVE   Ketones, ur NEGATIVE NEGATIVE mg/dL   Protein, ur NEGATIVE NEGATIVE mg/dL   Urobilinogen, UA 1.0 0.0 - 1.0 mg/dL   Nitrite NEGATIVE NEGATIVE   Leukocytes, UA MODERATE (A) NEGATIVE  Urine microscopic-add on     Status: Abnormal   Collection  Time: 04/26/15 11:37 PM  Result Value Ref Range   Squamous Epithelial / LPF MANY (A) RARE   WBC, UA TOO NUMEROUS TO COUNT <3 WBC/hpf   Bacteria, UA RARE RARE  CBC     Status: Abnormal   Collection Time: 04/27/15  5:48 AM  Result Value Ref Range   WBC 18.0 (H) 4.0 - 10.5 K/uL   RBC 2.93 (L) 3.87 - 5.11 MIL/uL   Hemoglobin 8.2 (L) 12.0 - 15.0 g/dL   HCT 24.5 (L) 36.0 - 46.0 %   MCV 83.6 78.0 - 100.0 fL   MCH 28.0 26.0 - 34.0 pg   MCHC 33.5 30.0 - 36.0 g/dL   RDW 14.0 11.5 - 15.5 %   Platelets 303 150 - 400 K/uL  Basic metabolic panel     Status: Abnormal   Collection Time: 04/27/15  5:48 AM  Result Value Ref Range   Sodium 133 (L) 135 - 145 mmol/L   Potassium 3.9 3.5 - 5.1 mmol/L   Chloride 100 (L) 101 - 111 mmol/L   CO2 27 22 - 32 mmol/L   Glucose, Bld 154 (H) 70 - 99 mg/dL   BUN 9 6 - 20 mg/dL   Creatinine, Ser 0.62 0.44 - 1.00 mg/dL   Calcium 8.5 (L) 8.9 - 10.3 mg/dL   GFR calc non Af Amer >60 >  60 mL/min   GFR calc Af Amer >60 >60 mL/min    Comment: (NOTE) The eGFR has been calculated using the CKD EPI equation. This calculation has not been validated in all clinical situations. eGFR's persistently <90 mL/min signify possible Chronic Kidney Disease.    Anion gap 6 5 - 15  CBC     Status: Abnormal   Collection Time: 04/28/15  6:48 AM  Result Value Ref Range   WBC 13.9 (H) 4.0 - 10.5 K/uL   RBC 2.89 (L) 3.87 - 5.11 MIL/uL   Hemoglobin 8.0 (L) 12.0 - 15.0 g/dL   HCT 24.4 (L) 36.0 - 46.0 %   MCV 84.4 78.0 - 100.0 fL   MCH 27.7 26.0 - 34.0 pg   MCHC 32.8 30.0 - 36.0 g/dL   RDW 14.1 11.5 - 15.5 %   Platelets 406 (H) 150 - 400 K/uL     HEENT: normal Cardio: RRR and no murmur Resp: CTA B/L and unlabored GI: BS positive and NT, ND Extremity:  Pulses positive and No Edema Skin:   Intact and Wound Left hip wrapped for prior to shower, no surrounding ecchymosis Neuro: Alert/Oriented, Normal Sensory and Abnormal Motor 2- Left HF, 3- KE, 4/5 L adf, normal strength on  R Musc/Skel:  Swelling Left hip Gen NAD   Assessment/Plan: 1. 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 Physiatrist is providing close team supervision and 24 hour management of active medical problems listed below. Physiatrist and rehab team continue to assess barriers to discharge/monitor patient progress toward functional and medical goals. Team conference today please see physician documentation under team conference tab, met with team face-to-face to discuss problems,progress, and goals. Formulized individual treatment plan based on medical history, underlying problem and comorbidities. FIM:          FIM - Radio producer Devices: Elevated toilet seat, Grab bars, Walker        Comprehension Comprehension Mode: Auditory Comprehension: 5-Understands complex 90% of the time/Cues < 10% of the time  Expression Expression Mode: Verbal Expression: 7-Expresses complex ideas: With no assist  Social Interaction Social Interaction: 5-Interacts appropriately 90% of the time - Needs monitoring or encouragement for participation or interaction.  Problem Solving Problem Solving: 5-Solves basic problems: With no assist  Memory Memory: 5-Recognizes or recalls 90% of the time/requires cueing < 10% of the time   Medical Problem List and Plan: 1. 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  DVT Prophylaxis/Anticoagulation: Pharmaceutical: Lovenox 3. Pain Management: Continue hydrocodone prn, Left Knee OA, add voltaren gel , last cortisone injection 1.5 yr ago 4. Mood: LCSW to follow for evaluation and support.   5. Neuropsych: This patient is capable of making decisions on her own behalf. 6. Skin/Wound Care: Routine pressure relief measures.    7.  Fluids/Electrolytes/Nutrition: Monitor I/O.  Check lytes in am.   8. Reactive Leucocytosis: Monitor for signs of infection.   9. UTI: Continue septra DS bid # 3/5 10. ABLA: continue iron supplement. Recheck labs in am. 12. HTN: Monitor BP every shift. Continue Avapro.  13. Nausea:  Question antibiotic related. Also reporting abdominal cramps--will check stool for c diff and d/c colace. Will add pepcid to help with GI symptoms and change iron to niferex as better tolerated.     LOS (Days) 1 A FACE TO FACE  EVALUATION WAS PERFORMED  Charlett Blake 04/29/2015, 7:53 AM

## 2015-04-29 NOTE — Evaluation (Signed)
Occupational Therapy Assessment and Plan  Patient Details  Name: [name] MRN: [id number] Date of Birth: 1951-06-26  OT Diagnosis: muscle weakness (generalized) and pain in joint Rehab Potential: Rehab Potential (ACUTE ONLY): Good ELOS: 7-10 days   Today's Date: 04/29/2015 OT Individual Time: 5784-6962 OT Individual Time Calculation (min): 60 min     Problem List:  Patient Active Problem List   Diagnosis Date Noted  . Gluten intolerance 04/27/2015  . Postoperative anemia due to acute blood loss 04/27/2015  . Closed hip fracture 04/22/2015  . Leg edema, left 04/22/2015  . Acute blood loss anemia 12/23/2014  . Fracture, intertrochanteric, left femur 12/18/2014  . Fall   . Subtrochanteric fracture of femur 12/16/2014  . UTI (urinary tract infection) 12/16/2014  . Leukocytosis 12/16/2014  . Hip fracture requiring operative repair   . Closed left subtrochanteric femur fracture 12/15/2014  . Lesion of right humerus 12/15/2014  . Osteoporosis, unspecified 10/15/2013  . HTN (hypertension) 07/16/2013  . Hypothyroidism 07/16/2013  . Osteopenia 07/16/2013  . Environmental allergies 07/16/2013  . Obesity (BMI 30-39.9) 07/16/2013    Past Medical History:  Past Medical History  Diagnosis Date  . Chickenpox   . Genital warts   . Environmental allergies   . HTN (hypertension)   . Hyperlipidemia   . Thyroid disease   . Thyroid nodule   . Hypothyroidism   . Complication of anesthesia     "hard to wake up:  Marland Kitchen Family history of adverse reaction to anesthesia     "sister & mom are hard to wake up:  . Heart murmur   . 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 Carpal tunnel release Bilateral 1990's  . Cystectomy      From the cervix  . Intramedullary (im) nail intertrochanteric Left 12/15/2014    Procedure: INTRAMEDULLARY (IM) NAIL INTERTROCHANTRIC;  Surgeon: Augustin Schooling,  MD;  Location: [facility];  Service: Orthopedics;  Laterality: Left;  . Fracture surgery    . Total hip arthroplasty Left 04/24/2015    Procedure:  TOTAL HIP ARTHROPLASTY AND REMOVAL OF IM AFFIXUS NAIL ;  Surgeon: Paralee Cancel, MD;  Location: [facility];  Service: Orthopedics;  Laterality: Left;    Assessment & Plan Clinical Impression: Patient is a 64 y.o. year old female with history of left femur fracture treated with IM Nailing 11/2014 (with CIR for therapy) who has had increase in pain in the hip for the past few weeks with difficulty walking. 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 She was started on narcotics for pain management and underwent conversion to L-THR by Dr. Alvan Dame on 04/24/15. Post op 50% WB on LLE and SCDs for DVT prophylaxis. Follow up labs with [facility]LA with drop in H/H to 8.0.  She developed fever on 05/01 with T- 102 and was noted to have elevated WBC -18.0 due to UTI. She was started on septra for treatment and has defervesced.   Patient transferred to CIR on 04/28/2015 .    Patient currently requires total with basic self-care skills secondary to muscle weakness and decreased standing balance, difficulty maintaining precautions.  Prior to hospitalization, patient could complete ADLs with modified independent .  Patient will benefit from skilled intervention to increase independence with basic self-care skills prior to discharge  home with care partner.  Anticipate patient will require intermittent supervision and follow up home health.  OT - End of Session Activity Tolerance: Tolerates 30+ min activity with multiple rests Endurance Deficit: No OT Assessment Rehab Potential (ACUTE ONLY): Good OT Patient demonstrates impairments in the following area(s): Balance;Endurance;Motor;Pain;Safety OT Basic ADL's Functional Problem(s): Grooming;Bathing;Dressing;Toileting OT  Advanced ADL's Functional Problem(s): Simple Meal Preparation OT Transfers Functional Problem(s): Toilet;Tub/Shower OT Additional Impairment(s): None OT Plan OT Intensity: Minimum of 1-2 x/day, 45 to 90 minutes OT Frequency: 5 out of 7 days OT Duration/Estimated Length of Stay: 7-10 days OT Treatment/Interventions: Balance/vestibular training;Community reintegration;Discharge planning;Disease mangement/prevention;DME/adaptive equipment instruction;Functional mobility training;Pain management;Patient/family education;Psychosocial support;Self Care/advanced ADL retraining;Skin care/wound managment;Therapeutic Activities;Therapeutic Exercise;UE/LE Strength taining/ROM;UE/LE Coordination activities OT Self Feeding Anticipated Outcome(s): Independent OT Basic Self-Care Anticipated Outcome(s): Mod I OT Toileting Anticipated Outcome(s): Mod I OT Bathroom Transfers Anticipated Outcome(s): Mod I OT Recommendation Patient destination: Home Follow Up Recommendations: Home health OT Equipment Recommended: None recommended by OT Equipment Details: has all equipment   Skilled Therapeutic Intervention OT eval completed with discussion on OT purpose, rehab process, goals, and ELOS.  ADL assessment completed at sit > stand level with bathing from tub bench in room shower and dressing seated at EOB.  Min assist with ambulation with RW with min cues for PWB status.  Pt able to recall 1/3 posterior hip precautions, educated on precautions during functional tasks.  Total assist with LB bathing and dressing secondary to hip precautions.  OT Evaluation Precautions/Restrictions  Precautions Precautions: Posterior Hip Restrictions Weight Bearing Restrictions: Yes LLE Weight Bearing: Partial weight bearing LLE Partial Weight Bearing Percentage or Pounds: 50% General   Vital Signs Therapy Vitals Temp: 98.1 F (36.7 C) Temp Source: Oral Pulse Rate: 86 Resp: 17 BP: (!) 156/58 mmHg Patient Position (if  appropriate): Lying Oxygen Therapy SpO2: 100 % O2 Device: Not Delivered Pain Pain Assessment Pain Assessment: No/denies pain Pain Score: 2  Pain Type: Surgical pain Pain Location: Hip Pain Orientation: Left Pain Descriptors / Indicators: Aching Pain Frequency: Intermittent Pain Onset: On-going Pain Intervention(s): Medication (See eMAR) Multiple Pain Sites: No Home Living/Prior Functioning Home Living Family/patient expects to be discharged to:: Private residence Living Arrangements: Spouse/significant other Available Help at Discharge: Family, Available PRN/intermittently Type of Home: House Home Access: Stairs to enter Technical brewer of Steps: 3 Entrance Stairs-Rails: Right, Left Home Layout: One level  Lives With: Spouse Prior Function Level of Independence: Requires assistive device for independence Driving: No Vocation: Full time employment (works from home) Leisure: Hobbies-yes (Comment) (enjoys arts and crafts) Comments: use of cane in home and RW outside on grass or in community ADL  See FIM Vision/Perception  Vision- History Baseline Vision/History: Wears glasses Wears Glasses: At all times Patient Visual Report: No change from baseline Vision- Assessment Vision Assessment?: No apparent visual deficits  Cognition Overall Cognitive Status: Within Functional Limits for tasks assessed Arousal/Alertness: Awake/alert Orientation Level: Oriented to person;Oriented to place;Oriented to time;Oriented to situation Attention: Selective Safety/Judgment: Appears intact Sensation Sensation Light Touch: Appears Intact Hot/Cold: Appears Intact Proprioception: Appears Intact Coordination Gross Motor Movements are Fluid and Coordinated: No Fine Motor Movements are Fluid and Coordinated: Yes Extremity/Trunk Assessment RUE Assessment RUE Assessment: Within Functional Limits LUE Assessment LUE Assessment: Within Functional Limits  FIM:  FIM -  Grooming Grooming Steps: Wash, rinse, dry face;Wash, rinse, dry hands;Brush, comb hair Grooming: 4: Steadying assist  or patient completes 3 of 4 or 4 of 5 steps FIM - Bathing Bathing Steps Patient Completed: Chest;Right Arm;Left  Arm;Abdomen;Front perineal area;Buttocks;Right upper leg;Left upper leg Bathing: 4: Min-Patient completes 8-9 81f 10 parts or 75+ percent FIM - Upper Body Dressing/Undressing Upper body dressing/undressing steps patient completed: Thread/unthread right bra strap;Thread/unthread left bra strap;Hook/unhook bra;Thread/unthread right sleeve of pullover shirt/dresss;Thread/unthread left sleeve of pullover shirt/dress;Put head through opening of pull over shirt/dress;Pull shirt over trunk Upper body dressing/undressing: 5: Set-up assist to: Obtain clothing/put away FIM - Lower Body Dressing/Undressing Lower body dressing/undressing steps patient completed: Pull underwear up/down;Pull pants up/down Lower body dressing/undressing: 1: Total-Patient completed less than 25% of tasks FIM - Toileting Toileting steps completed by patient: Adjust clothing prior to toileting;Performs perineal hygiene;Adjust clothing after toileting Toileting Assistive Devices: Grab bar or rail for support Toileting: 4: Steadying assist FIM - Bed/Chair Transfer Bed/Chair Transfer: 4: Supine > Sit: Min A (steadying Pt. > 75%/lift 1 leg);4: Bed > Chair or W/C: Min A (steadying Pt. > 75%);4: Chair or W/C > Bed: Min A (steadying Pt. > 75%) FIM - Air cabin crew Transfers Assistive Devices: Elevated toilet seat;Grab bars;Walker Toilet Transfers: 4-To toilet/BSC: Min A (steadying Pt. > 75%);4-From toilet/BSC: Min A (steadying Pt. > 75%) FIM - Tub/Shower Transfers Tub/Shower Assistive Devices: Tub transfer bench;Grab bars;Walker;Walk in shower Tub/shower Transfers: 4-Into Tub/Shower: Min A (steadying Pt. > 75%/lift 1 leg);4-Out of Tub/Shower: Min A (steadying Pt. > 75%/lift 1 leg)   Refer to Care  Plan for Long Term Goals  Recommendations for other services: None  Discharge Criteria: Patient will be discharged from OT if patient refuses treatment 3 consecutive times without medical reason, if treatment goals not met, if there is a change in medical status, if patient makes no progress towards goals or if patient is discharged from hospital.  The above assessment, treatment plan, treatment alternatives and goals were discussed and mutually agreed upon: by patient  Tommy Goostree, [facility] 04/29/2015, 9:15 AM

## 2015-04-29 NOTE — Progress Notes (Signed)
Patient information reviewed and entered into eRehab system by Alcee Sipos, RN, CRRN, PPS Coordinator.  Information including medical coding and functional independence measure will be reviewed and updated through discharge.    

## 2015-04-30 ENCOUNTER — Inpatient Hospital Stay (HOSPITAL_COMMUNITY): Payer: 59

## 2015-04-30 ENCOUNTER — Inpatient Hospital Stay (HOSPITAL_COMMUNITY): Payer: 59 | Admitting: *Deleted

## 2015-04-30 ENCOUNTER — Inpatient Hospital Stay (HOSPITAL_COMMUNITY): Payer: 59 | Admitting: Occupational Therapy

## 2015-04-30 ENCOUNTER — Ambulatory Visit: Payer: 59 | Admitting: Physical Therapy

## 2015-04-30 LAB — URINE CULTURE

## 2015-04-30 NOTE — Progress Notes (Signed)
Occupational Therapy Session Note  Patient Details  Name: [name] MRN: [id number] Date of Birth: 30-Oct-1951  Today's Date: 04/30/2015 OT Individual Time: 1004-1103 OT Individual Time Calculation (min): 59 min    Short Term Goals: Week 1:  OT Short Term Goal 1 (Week 1): STG = LTGs due to ELOS  Skilled Therapeutic Interventions/Progress Updates:    Pt performed shower transfer with min guard assist using the RW for support.  Integrated use of the reacher and LH sponge for washing and for drying LEs in order to adhere to hip precautions.  She was able to perform dressing in front of the sink with use of reacher and sockaide as well with min assist just to donn the left shoe.    Therapy Documentation Precautions:  Precautions Precautions: Posterior Hip Precaution Booklet Issued: Yes (comment) Precaution Comments: Reviewed hip precautions with pt ; she stated 2/3 Restrictions Weight Bearing Restrictions: Yes LLE Weight Bearing: Partial weight bearing LLE Partial Weight Bearing Percentage or Pounds: 50% Other Position/Activity Restrictions: 50%  Pain: Pain Assessment Pain Assessment: Faces Pain Score: 6  Faces Pain Scale: Hurts little more Pain Type: Acute pain Pain Location: Hip Pain Orientation: Right Pain Descriptors / Indicators: Dull Pain Frequency: Intermittent Pain Onset: With Activity Pain Intervention(s): Repositioned;Emotional support ADL: See FIM for current functional status  Therapy/Group: Individual Therapy  Tavian Callander OTR/L 04/30/2015, 12:50 PM

## 2015-04-30 NOTE — Progress Notes (Signed)
Occupational Therapy Session Note  Patient Details  Name: [name] MRN: [id number] Date of Birth: 01/02/1951  Today's Date: 04/30/2015 OT Individual Time: 1433-1501 OT Individual Time Calculation (min): 28 min    Skilled Therapeutic Interventions/Progress Updates:    Pt performed transfers in the ADL apartment to begin session.  Still with decreased ability to state 3/3 hip precautions.  She is able to identify 2/3 but gets confused with less than 90 degree angle.  She continues to need use of a leg lifter for lifting the LLE into the bed but can move it to the edge to get OOB with supervision.  Min assist for transfer to the 3:1 over the toilet as well.  Complete session in room with continued work on donning left shoe using reacher and [place] shoe horn.  Pt continues to need min assist to donn with elastic laces in place, secondary to weakness and swelling.    Therapy Documentation Precautions:  Precautions Precautions: Posterior Hip Precaution Booklet Issued: Yes (comment) Precaution Comments: Reviewed hip precautions with pt ; she stated 2/3 Restrictions Weight Bearing Restrictions: Yes LLE Weight Bearing: Partial weight bearing LLE Partial Weight Bearing Percentage or Pounds: 50% Other Position/Activity Restrictions: 50%  Pain: Pain Assessment Pain Assessment: 0-10 Faces Pain Scale: Hurts a little bit Pain Type: Acute pain Pain Location: Hip Pain Orientation: Left Pain Intervention(s): Medication (See eMAR);Repositioned ADL: See FIM for current functional status  Therapy/Group: Individual Therapy  Madonna Flegal OTR/L 04/30/2015, 4:37 PM

## 2015-04-30 NOTE — Progress Notes (Addendum)
Physical Therapy Session Note  Patient Details  Name: [name] MRN: [id number] Date of Birth: 05/04/1951  Today's Date: 04/30/2015 PT Individual Time: 0905-1005, 1300-1400 PT Individual Time Calculation (min): 60 min , 60 min  Short Term Goals: Week 1:  PT Short Term Goal 1 (Week 1): = LTGs due to LOS  Skilled Therapeutic Interventions/Progress Updates:    tx 1 focused on gait training, L LE strengthening exs, w/c  Ulice Dash basic cushion with solid insert added to w/c for pelvic positioning and comfort.  Recliner> w/c with RW, min assist. Gait with RW x 35' with 1 standing rest break, x 10' , mod cues for sequencing, step length, upright posture and forward gaze.  Therapeutic exercise performed with LE to increase strength for functional mobility:  Sitting: 5 x 1 R long arc quad knee ext with 3#, L without wt, with ankle pumps at end range; standing: 10 x 1 bil calf raises ,10 x 1  L hip abd, 4 x 1 L hip flexion. Hip flexion limited by pain.  Mod cues throughout for diaphragmatic breathing during rest breaks and during painful movements.    Hand out provided for surgical hip exs.  Pt propelled w/c 77' with supervision; managed brakes before transfers with cues.  OT picked up pt for next session.  tx 2 focused on w/c propulsion for activity tolerance,gait , therapeutic exs  W/c propulsion x 150' with supervision; extended brake placed on w/c L to limit hip flexion during manipulation.  Cues for w/c set up for transfer.  Gait with RW in home setting x 12' and controlled setting x 50' , x 68' with min guard assist, mod cues as above. Up/down 3 (5" ) steps bil rail, ascending and descending forward, min assist, step to technique with min cues.  Supine, (with pillow partially under L hip for comfort) exs: 10 x 1 L short arc quad knee ext, R bridging, active assistive hip flexion/ext.  AROM hip limited by pain. R sidelying: 10 x 2 active assistive hip ext toward neutral. Hip flexor  tightness limits AROM. R sidelying > sit with min assist for LLE.  Pt left resting in room with all needs within reach. Therapy Documentation Precautions:  Precautions Precautions: Posterior Hip Precaution Booklet Issued: Yes (comment) Precaution Comments: Reviewed hip precautions with pt ; she stated 2/3 Restrictions Weight Bearing Restrictions: Yes LLE Weight Bearing: Partial weight bearing LLE Partial Weight Bearing Percentage or Pounds: 50% Other Position/Activity Restrictions: 50%   Vital Signs: Therapy Vitals Pulse Rate: 93 BP: (!) 155/64 mmHg (at rest) Patient Position (if appropriate): Sitting Pain: Pain Assessment Pain Assessment: 0-10 Pain Score: 3  Pain Type: Acute pain Pain Location: Hip Pain Orientation: Left Pain Descriptors / Indicators: Dull Pain Intervention(s): Medication (See eMAR)    See FIM for current functional status  Therapy/Group: Individual Therapy  [name] 04/30/2015, 12:12 PM

## 2015-04-30 NOTE — IPOC Note (Signed)
Overall Plan of Care [facility]) Patient Details Name: [name] MRN: [id number] DOB: 1951/12/11  Admitting Diagnosis: failed l hip pinning  Hospital Problems: Principal Problem:   Closed left subtrochanteric femur fracture Active Problems:   Postoperative anemia due to acute blood loss     Functional Problem List: Nursing Bowel, Edema, Endurance, Medication Management, Motor, Pain, Skin Integrity  PT Balance, Edema, Endurance, Motor  OT Balance, Endurance, Motor, Pain, Safety  SLP    TR Endurance, Edema, Motor, Pain, Safety, Skin Integrity       Basic ADL's: OT Grooming, Bathing, Dressing, Toileting     Advanced  ADL's: OT Simple Meal Preparation     Transfers: PT Bed Mobility, Bed to Chair, Car, Manufacturing systems engineer, Metallurgist: PT Ambulation, Emergency planning/management officer, Stairs     Additional Impairments: OT None  SLP        TR      Anticipated Outcomes Item Anticipated Outcome  Self Feeding Independent  Swallowing      Basic self-care  Mod I  Toileting  Mod I   Bathroom Transfers Mod I  Bowel/Bladder  Mod I  Transfers  modified independent basic, min assist car  Locomotion  supervision gait controlled env x 150; modified independent gait home env x 50'; supervision up/down 5 steps 1 rail; modified ind. w/c propulsion controlled env x 150' supervison w/c community 150' independent;   Communication     Cognition     Pain  < 4  Safety/Judgment  Mod I   Therapy Plan: PT Intensity: Minimum of 1-2 x/day ,45 to 90 minutes PT Frequency: 5 out of 7 days PT Duration Estimated Length of Stay: 7-10 OT Intensity: Minimum of 1-2 x/day, 45 to 90 minutes OT Frequency: 5 out of 7 days OT Duration/Estimated Length of Stay: 7-10 days         Team Interventions: Nursing Interventions Patient/Family Education, Bowel Management, Pain Management, Medication Management, Skin Care/Wound Management  PT interventions Ambulation/gait training,  Training and development officer, Discharge planning, Community reintegration, DME/adaptive equipment instruction, Functional mobility training, Patient/family education, Neuromuscular re-education, Pain management, Psychosocial support, Therapeutic Exercise, Therapeutic Activities, Stair training, UE/LE Strength taining/ROM, UE/LE Coordination activities, Wheelchair propulsion/positioning  OT Interventions Training and development officer, Academic librarian, Discharge planning, Disease mangement/prevention, Engineer, drilling, Functional mobility training, Pain management, Patient/family education, Psychosocial support, Self Care/advanced ADL retraining, Skin care/wound managment, Therapeutic Activities, Therapeutic Exercise, UE/LE Strength taining/ROM, UE/LE Coordination activities  SLP Interventions    TR Interventions    SW/CM Interventions Discharge Planning, Psychosocial Support, Patient/Family Education    Team Discharge Planning: Destination: PT-Home ,OT- Home , SLP-  Projected Follow-up: PT-Home health PT, OT-  Home health OT, SLP-  Projected Equipment Needs: PT-Wheelchair (measurements), Wheelchair cushion (measurements), To be determined, OT- None recommended by OT, SLP-  Equipment Details: PT-may need w/c for community access, OT-has all equipment Patient/family involved in discharge planning: PT- Patient, Family member/caregiver,  OT-Patient, SLP-   MD ELOS: 7-10 days Medical Rehab Prognosis:  Excellent Assessment: The patient has been admitted for CIR therapies with the diagnosis of left peri-trochanteric hip fx. The team will be addressing functional mobility, strength, stamina, balance, safety, adaptive techniques and equipment, self-care, bowel and bladder mgt, patient and caregiver education, pain mgt, ortho precautions, community reintegration, ego support. Goals have been set at mod I for mobility and self-care.    [name], MD, FAAPMR      See Team  Conference Notes for weekly updates to the plan of care

## 2015-04-30 NOTE — Progress Notes (Signed)
64 y.o. female with history of left femur fracture treated with IM Nailing 11/2014 (with CIR for therapy) who has had increase in pain in the hip for the past few weeks with difficulty walking. 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 She was started on narcotics for pain management and underwent conversion to L-THR by Dr. Alvan Dame on 04/24/15. Post op 50% WB on LLE   Subjective/Complaints: Bowels and bladder doing ok Less knee pain with voltaren gel  ROS Neg except as above Objective: Vital Signs: Blood pressure 117/54, pulse 84, temperature 98.9 F (37.2 C), temperature source Oral, resp. rate 18, height $RemoveBe'5\' 1"'[id number]$  (1.549 m), weight 89.6 kg (197 lb 8.5 oz), SpO2 95 %. No results found. Results for orders placed or performed during the hospital encounter of 04/28/15 (from the past 72 hour(s))  Urine culture     Status: None   Collection Time: 04/28/15 11:05 PM  Result Value Ref Range   Specimen Description URINE, CLEAN CATCH    Special Requests bactrim    Colony Count      30,000 COLONIES/ML Performed at [facility]    Culture      Multiple bacterial morphotypes present, none predominant. Suggest appropriate recollection if clinically indicated. Performed at [facility]    Report Status 04/30/2015 FINAL   CBC WITH DIFFERENTIAL     Status: Abnormal   Collection Time: 04/29/15  8:44 AM  Result Value Ref Range   WBC 14.6 (H) 4.0 - 10.5 K/uL   RBC 3.32 (L) 3.87 - 5.11 MIL/uL   Hemoglobin 9.0 (L) 12.0 - 15.0 g/dL   HCT 28.4 (L) 36.0 - 46.0 %   MCV 85.5 78.0 - 100.0 fL   MCH 27.1 26.0 - 34.0 pg   MCHC 31.7 30.0 - 36.0 g/dL   RDW 14.3 11.5 - 15.5 %   Platelets 585 (H) 150 - 400 K/uL   Neutrophils Relative % 69 43 - 77 %   Lymphocytes Relative 17 12 - 46 %   Monocytes Relative 9 3 - 12 %   Eosinophils Relative 4 0 - 5 %   Basophils Relative 1 0 - 1 %    Neutro Abs 10.1 (H) 1.7 - 7.7 K/uL   Lymphs Abs 2.5 0.7 - 4.0 K/uL   Monocytes Absolute 1.3 (H) 0.1 - 1.0 K/uL   Eosinophils Absolute 0.6 0.0 - 0.7 K/uL   Basophils Absolute 0.1 0.0 - 0.1 K/uL   RBC Morphology POLYCHROMASIA PRESENT    WBC Morphology MILD LEFT SHIFT (1-5% METAS, OCC MYELO, OCC BANDS)    Smear Review PLATELETS APPEAR INCREASED   Comprehensive metabolic panel     Status: Abnormal   Collection Time: 04/29/15  8:44 AM  Result Value Ref Range   Sodium 132 (L) 135 - 145 mmol/L   Potassium 4.1 3.5 - 5.1 mmol/L   Chloride 95 (L) 101 - 111 mmol/L   CO2 24 22 - 32 mmol/L   Glucose, Bld 145 (H) 70 - 99 mg/dL   BUN 13 6 - 20 mg/dL   Creatinine, Ser 0.80 0.44 - 1.00 mg/dL   Calcium 9.0 8.9 - 10.3 mg/dL   Total Protein 6.3 (L) 6.5 - 8.1 g/dL   Albumin 2.8 (L) 3.5 - 5.0 g/dL   AST 33 15 - 41 U/L   ALT 51 14 - 54 U/L   Alkaline Phosphatase 88  38 - 126 U/L   Total Bilirubin 0.6 0.3 - 1.2 mg/dL   GFR calc non Af Amer >60 >60 mL/min   GFR calc Af Amer >60 >60 mL/min    Comment: (NOTE) The eGFR has been calculated using the CKD EPI equation. This calculation has not been validated in all clinical situations. eGFR's persistently <90 mL/min signify possible Chronic Kidney Disease.    Anion gap 13 5 - 15     HEENT: normal Cardio: RRR and no murmur Resp: CTA B/L and unlabored GI: BS positive and NT, ND Extremity:  Pulses positive and No Edema Skin:   Intact and Wound Left hip wrapped for prior to shower, no surrounding ecchymosis Neuro: Alert/Oriented, Normal Sensory and Abnormal Motor 2- Left HF, 3- KE, 4/5 L adf, normal strength on R Musc/Skel:  Swelling Left hip Gen NAD   Assessment/Plan: 1. 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 Physiatrist is providing close team supervision and 24 hour management of active medical  problems listed below. Physiatrist and rehab team continue to assess barriers to discharge/monitor patient progress toward functional and medical goals.  FIM: FIM - Bathing Bathing Steps Patient Completed: Chest, Right Arm, Left Arm, Abdomen, Front perineal area, Buttocks, Right upper leg, Left upper leg Bathing: 4: Min-Patient completes 8-9 7f 10 parts or 75+ percent  FIM - Upper Body Dressing/Undressing Upper body dressing/undressing steps patient completed: Thread/unthread right bra strap, Thread/unthread left bra strap, Hook/unhook bra, Thread/unthread right sleeve of pullover shirt/dresss, Thread/unthread left sleeve of pullover shirt/dress, Put head through opening of pull over shirt/dress, Pull shirt over trunk Upper body dressing/undressing: 5: Set-up assist to: Obtain clothing/put away FIM - Lower Body Dressing/Undressing Lower body dressing/undressing steps patient completed: Pull underwear up/down, Pull pants up/down Lower body dressing/undressing: 1: Total-Patient completed less than 25% of tasks  FIM - Toileting Toileting steps completed by patient: Adjust clothing prior to toileting, Performs perineal hygiene, Adjust clothing after toileting Toileting Assistive Devices: Grab bar or rail for support Toileting: 4: Steadying assist  FIM - Radio producer Devices: Elevated toilet seat, Grab bars, Insurance account manager Transfers: 4-To toilet/BSC: Min A (steadying Pt. > 75%), 4-From toilet/BSC: Min A (steadying Pt. > 75%)  FIM - Bed/Chair Transfer Bed/Chair Transfer: 3: Chair or W/C > Bed: Mod A (lift or lower assist), 3: Bed > Chair or W/C: Mod A (lift or lower assist)  FIM - Locomotion: Wheelchair Distance: 180 Locomotion: Wheelchair: 5: Travels 150 ft or more: maneuvers on rugs and over door sills with supervision, cueing or coaxing FIM - Locomotion: Ambulation Locomotion: Ambulation Assistive Devices: Administrator Ambulation/Gait Assistance: 4: Min  assist Locomotion: Ambulation: 1: Travels less than 50 ft with minimal assistance (Pt.>75%)  Comprehension Comprehension Mode: Auditory Comprehension: 5-Understands complex 90% of the time/Cues < 10% of the time  Expression Expression Mode: Verbal Expression: 7-Expresses complex ideas: With no assist  Social Interaction Social Interaction: 5-Interacts appropriately 90% of the time - Needs monitoring or encouragement for participation or interaction.  Problem Solving Problem Solving: 5-Solves basic problems: With no assist  Memory Memory: 5-Recognizes or recalls 90% of the time/requires cueing < 10% of the time   Medical Problem List and Plan: 1. 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  DVT Prophylaxis/Anticoagulation: Pharmaceutical: Lovenox 3. Pain Management: Continue hydrocodone prn, Left Knee OA, add voltaren  gel , last cortisone injection 1.5 yr ago 4. Mood: LCSW to follow for evaluation and support.   5. Neuropsych: This patient is capable of making decisions on her own behalf. 6. Skin/Wound Care: Routine pressure relief measures.    7. Fluids/Electrolytes/Nutrition: Monitor I/O.  Check lytes in am.   8. Reactive Leucocytosis: Monitor for signs of infection.   9. UTI: Continue septra DS bid # 4/5 10. ABLA: continue iron supplement. Recheck hgb 9.0 12. HTN: Monitor BP every shift. Continue Avapro.  13. Nausea:  improved  LOS (Days) 2 A FACE TO FACE EVALUATION WAS PERFORMED  Malesha Suliman E 04/30/2015, 8:03 AM

## 2015-05-01 ENCOUNTER — Inpatient Hospital Stay (HOSPITAL_COMMUNITY): Payer: 59 | Admitting: Occupational Therapy

## 2015-05-01 ENCOUNTER — Inpatient Hospital Stay (HOSPITAL_COMMUNITY): Payer: 59 | Admitting: Rehabilitation

## 2015-05-01 ENCOUNTER — Inpatient Hospital Stay (HOSPITAL_COMMUNITY): Payer: 59

## 2015-05-01 LAB — CREATININE, SERUM: Creatinine, Ser: 0.82 mg/dL (ref 0.44–1.00)

## 2015-05-01 NOTE — Progress Notes (Signed)
Recreational Therapy Session Note  Patient Details  Name: [name] MRN: [id number] Date of Birth: [birth year] Today's Date: 05/01/2015   Due to anticipated discharge date of Monday, 5/9 full TR eval deferred. [place] 05/01/2015, 1:35 PM

## 2015-05-01 NOTE — Progress Notes (Signed)
Social Work Patient ID: [name], female   DOB: 1951/01/12, 65 y.o.   MRN: [id number] Met with pt and husband team feels pt will be ready for discharge on Monday.  MD and PA aware and are agreeable.  Pt does want to get a wheelchair for longer distances, she sent it back From last time. She does want to go to [facility] since she was going there before this happened. Work toward discharge on Monday.

## 2015-05-01 NOTE — Progress Notes (Signed)
Patient's last bowel movement noted to be 5/3. Offered patient Miralax PRN dose, patient declined. Stating she rather try prune juice. Explained to patient that iron medication, and pain medication can cause constipation. Encouraged to increase water intake. Verbalized understanding will continue to monitor.[place]

## 2015-05-01 NOTE — Progress Notes (Signed)
Physical Therapy Session Note  Patient Details  Name: [name] MRN: [id number] Date of Birth: 1951-01-02  Today's Date: 05/01/2015 PT Individual Time: 1325-1430 PT Individual Time Calculation (min): 65 min  Patient missed 10 minutes due to pain and fatigue.  Short Term Goals: Week 1:  PT Short Term Goal 1 (Week 1): = LTGs due to LOS  Skilled Therapeutic Interventions/Progress Updates:  Patient sitting in wheelchair upon entering room. Patient propelled wheelchair 150+ feet to and from treatment areas. Patient worked on ambulating in home simulated environment with RW and supervision. Patient worked on sit <> supine on regular double bed. Patient required cueing not to break hip precautions by bending forward to assist left LE with UE's to get on and off bed. Patient required min assist. Pt tried leg lifter with some success. Patient performed car transfer with min assist for left LE into and out of car and cueing for hip precautions. Patient ambulated up and down 4 steps using 1 rail and min steady assist. Patient performed left LE exer in supine x 10 reps each including heel slides, ankle pumps, SAQ's, knee presses, and hip ab/adduction. Patient had difficulty getting left LE flat on mat due to pain in medial hip. Patient ambulated 96 feet with RW and supervision. Patient reported "I'm done." Patient returned to room and transferred into recliner. Patient left with all items in reach and husband present. RN notified that patient would like pain medicine.  Therapy Documentation Precautions:  Precautions Precautions: Posterior Hip Precaution Booklet Issued: Yes (comment) Precaution Comments: Reviewed hip precautions with pt ; she stated 2/3 Restrictions Weight Bearing Restrictions: Yes LLE Weight Bearing: Partial weight bearing LLE Partial Weight Bearing Percentage or Pounds: 50% Other Position/Activity Restrictions: 50% General: PT Amount of Missed Time (min): 10 Minutes PT Missed  Treatment Reason: Pain;Patient fatigue  Pain: Pain Assessment Pain Assessment: 0-10 Pain Score: 6  Pain Type: Surgical pain Pain Location: Hip Pain Orientation: Left Pain Descriptors / Indicators: Aching Pain Frequency: Constant Pain Onset: Progressive Patients Stated Pain Goal: 3 Pain Intervention(s): Medication (See eMAR)  Locomotion : Ambulation Ambulation/Gait Assistance: 5: Supervision Wheelchair Mobility Distance: 150    See FIM for current functional status  Therapy/Group: Individual Therapy  Elder Love M 05/01/2015, 2:54 PM

## 2015-05-01 NOTE — Progress Notes (Signed)
64 y.o. female with history of left femur fracture treated with IM Nailing 11/2014 (with CIR for therapy) who has had increase in pain in the hip for the past few weeks with difficulty walking. 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 She was started on narcotics for pain management and underwent conversion to L-THR by Dr. Alvan Dame on 04/24/15. Post op 50% WB on LLE   Subjective/Complaints: No issues overnite except sleeping position, had some nausea yesterday pm, received IV Zofran  ROS Neg except as above Objective: Vital Signs: Blood pressure 139/55, pulse 98, temperature 98.7 F (37.1 C), temperature source Oral, resp. rate 18, height _0  (1.549 m), weight 89.6 kg (197 lb 8.5 oz), SpO2 98 %. No results found. Results for orders placed or performed during the hospital encounter of 04/28/15 (from the past 72 hour(s))  Urine culture     Status: None   Collection Time: 04/28/15 11:05 PM  Result Value Ref Range   Specimen Description URINE, CLEAN CATCH    Special Requests bactrim    Colony Count      30,000 COLONIES/ML Performed at [facility]    Culture      Multiple bacterial morphotypes present, none predominant. Suggest appropriate recollection if clinically indicated. Performed at [facility]    Report Status 04/30/2015 FINAL   CBC WITH DIFFERENTIAL     Status: Abnormal   Collection Time: 04/29/15  8:44 AM  Result Value Ref Range   WBC 14.6 (H) 4.0 - 10.5 K/uL   RBC 3.32 (L) 3.87 - 5.11 MIL/uL   Hemoglobin 9.0 (L) 12.0 - 15.0 g/dL   HCT 28.4 (L) 36.0 - 46.0 %   MCV 85.5 78.0 - 100.0 fL   MCH 27.1 26.0 - 34.0 pg   MCHC 31.7 30.0 - 36.0 g/dL   RDW 14.3 11.5 - 15.5 %   Platelets 585 (H) 150 - 400 K/uL   Neutrophils Relative % 69 43 - 77 %   Lymphocytes Relative 17 12 - 46 %   Monocytes Relative 9 3 - 12 %   Eosinophils Relative 4 0 - 5 %    Basophils Relative 1 0 - 1 %   Neutro Abs 10.1 (H) 1.7 - 7.7 K/uL   Lymphs Abs 2.5 0.7 - 4.0 K/uL   Monocytes Absolute 1.3 (H) 0.1 - 1.0 K/uL   Eosinophils Absolute 0.6 0.0 - 0.7 K/uL   Basophils Absolute 0.1 0.0 - 0.1 K/uL   RBC Morphology POLYCHROMASIA PRESENT    WBC Morphology MILD LEFT SHIFT (1-5% METAS, OCC MYELO, OCC BANDS)    Smear Review PLATELETS APPEAR INCREASED   Comprehensive metabolic panel     Status: Abnormal   Collection Time: 04/29/15  8:44 AM  Result Value Ref Range   Sodium 132 (L) 135 - 145 mmol/L   Potassium 4.1 3.5 - 5.1 mmol/L   Chloride 95 (L) 101 - 111 mmol/L   CO2 24 22 - 32 mmol/L   Glucose, Bld 145 (H) 70 - 99 mg/dL   BUN 13 6 - 20 mg/dL   Creatinine, Ser 0.80 0.44 - 1.00 mg/dL   Calcium 9.0 8.9 - 10.3 mg/dL   Total Protein 6.3 (L) 6.5 - 8.1 g/dL   Albumin 2.8 (L) 3.5 - 5.0 g/dL   AST 33 15 - 41 U/L   ALT 51 14 - 54 U/L  Alkaline Phosphatase 88 38 - 126 U/L   Total Bilirubin 0.6 0.3 - 1.2 mg/dL   GFR calc non Af Amer >60 >60 mL/min   GFR calc Af Amer >60 >60 mL/min    Comment: (NOTE) The eGFR has been calculated using the CKD EPI equation. This calculation has not been validated in all clinical situations. eGFR's persistently <90 mL/min signify possible Chronic Kidney Disease.    Anion gap 13 5 - 15  Creatinine, serum     Status: None   Collection Time: 05/01/15  5:49 AM  Result Value Ref Range   Creatinine, Ser 0.82 0.44 - 1.00 mg/dL   GFR calc non Af Amer >60 >60 mL/min   GFR calc Af Amer >60 >60 mL/min    Comment: (NOTE) The eGFR has been calculated using the CKD EPI equation. This calculation has not been validated in all clinical situations. eGFR's persistently <60 mL/min signify possible Chronic Kidney Disease.      HEENT: normal Cardio: RRR and no murmur Resp: CTA B/L and unlabored GI: BS positive and NT, ND Extremity:  Pulses positive and No Edema Skin:   Intact and Wound Left hip CDI Neuro: Alert/Oriented, Normal Sensory  and Abnormal Motor 2- Left HF, 3- KE, 4/5 L adf, normal strength on R Musc/Skel:  Swelling Left hip Gen NAD   Assessment/Plan: 1. 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 Physiatrist is providing close team supervision and 24 hour management of active medical problems listed below. Physiatrist and rehab team continue to assess barriers to discharge/monitor patient progress toward functional and medical goals.  FIM: FIM - Bathing Bathing Steps Patient Completed: Chest, Right Arm, Left Arm, Abdomen, Front perineal area, Buttocks, Right upper leg, Left upper leg, Right lower leg (including foot), Left lower leg (including foot) Bathing: 5: Supervision: Safety issues/verbal cues  FIM - Upper Body Dressing/Undressing Upper body dressing/undressing steps patient completed: Thread/unthread right bra strap, Thread/unthread left bra strap, Hook/unhook bra, Thread/unthread right sleeve of pullover shirt/dresss, Thread/unthread left sleeve of pullover shirt/dress, Put head through opening of pull over shirt/dress, Pull shirt over trunk Upper body dressing/undressing: 5: Set-up assist to: Obtain clothing/put away FIM - Lower Body Dressing/Undressing Lower body dressing/undressing steps patient completed: Pull underwear up/down, Pull pants up/down, Thread/unthread right underwear leg, Thread/unthread left underwear leg, Thread/unthread right pants leg, Don/Doff right sock, Thread/unthread left pants leg, Don/Doff left sock, Don/Doff right shoe Lower body dressing/undressing: 4: Min-Patient completed 75 plus % of tasks  FIM - Toileting Toileting steps completed by patient: Adjust clothing prior to toileting, Performs perineal hygiene, Adjust clothing after toileting Toileting Assistive Devices: Grab bar or rail for support Toileting: 4:  Steadying assist  FIM - Radio producer Devices: Elevated toilet seat, Grab bars, Insurance account manager Transfers: 4-To toilet/BSC: Min A (steadying Pt. > 75%), 4-From toilet/BSC: Min A (steadying Pt. > 75%)  FIM - Bed/Chair Transfer Bed/Chair Transfer: 4: Bed > Chair or W/C: Min A (steadying Pt. > 75%), 5: Sit > Supine: Supervision (verbal cues/safety issues), 6: Supine > Sit: No assist  FIM - Locomotion: Wheelchair Distance: 50 Locomotion: Wheelchair: 2: Travels 50 - 149 ft with supervision, cueing or coaxing FIM - Locomotion: Ambulation Locomotion: Ambulation Assistive Devices: Administrator Ambulation/Gait Assistance: 4: Min guard Locomotion: Ambulation: 1: Travels less than 50 ft with minimal assistance (Pt.>75%)  Comprehension Comprehension Mode:  Auditory Comprehension: 5-Follows basic conversation/direction: With extra time/assistive device  Expression Expression Mode: Verbal Expression: 5-Expresses basic 90% of the time/requires cueing < 10% of the time.  Social Interaction Social Interaction: 5-Interacts appropriately 90% of the time - Needs monitoring or encouragement for participation or interaction.  Problem Solving Problem Solving: 5-Solves basic problems: With no assist  Memory Memory: 5-Recognizes or recalls 90% of the time/requires cueing < 10% of the time   Medical Problem List and Plan: 1. 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  DVT Prophylaxis/Anticoagulation: Pharmaceutical: Lovenox 3. Pain Management: Continue hydrocodone prn, Left Knee OA, add voltaren gel , last cortisone injection 1.5 yr ago 4. Mood: LCSW to follow for evaluation and support.   5. Neuropsych: This patient is capable of making decisions on her own behalf. 6. Skin/Wound Care: Routine pressure relief measures.    7. Fluids/Electrolytes/Nutrition: Monitor I/O.  Check lytes in am.   8.  Reactive Leucocytosis: Monitor for signs of infection.   9. UTI: Continue septra DS bid # 5/5 10. ABLA: continue iron supplement. Recheck hgb 9.0 12. HTN: Monitor BP every shift. Continue Avapro.  13. Nausea:  improved , D/C IV  LOS (Days) 3 A FACE TO FACE EVALUATION WAS PERFORMED  KIRSTEINS,ANDREW E 05/01/2015, 7:30 AM

## 2015-05-01 NOTE — Progress Notes (Signed)
Physical Therapy Session Note  Patient Details  Name: [name] MRN: [id number] Date of Birth: 1951/08/28  Today's Date: 05/01/2015 PT Individual Time: 1045-1200 PT Individual Time Calculation (min): 75 min   Short Term Goals: Week 1:  PT Short Term Goal 1 (Week 1): = LTGs due to LOS  Skilled Therapeutic Interventions/Progress Updates:   Skilled session focused on functional activities to prepare for D/C home.  Performed gait in controlled and home environment, bed mobility, tub transfer with bench, stair negotiation, and standing therex.  Performed stairs with use of L rail sideways in step to fashion with min/guard to min A level with demonstration to both pt and husband prior to performing for safety and technique.  Discussed sleeping positions and placement of pillows to maintain precautions.  Pt verbalized understanding.  Performed gait x 65' x 1 and 95' x 1 with RW at close S level with min cues for posture and maintaining L foot in straight alignment.  Performed bed mobility to ADL apt bed at S level with min cues for safety and technique when getting up from lower surface to ensure THP.  Discussed with pt and husband concerns for bed height being 31", side stepping into restroom and performing tub transfer via bench.  Stated that we could practice getting in/out of higher bed this afternoon with PT session.  Performed side stepping in/out of restroom at S level w/ RW with min cues when turning for THP.  Performed tub transfer via transfer bench at min A level for elevating LLE into tub while maintaining THP.  Pt and husband verbalized understanding.  Ended session with standing therex as follows; seated LAQ's x 10 reps LLE, standing marching x 10 reps LLE, standing hip abd x 10 reps LLE, standing knee flex x 10 reps LLE, standing hip ext x 10 reps LLE.  Pt tolerated well with min cues for posture and technique with seated rest breaks following two exercises due to fatigue.  Assisted back to  room following gait.  Left in w/c with all needs in reach and ice pack on hip for pain and swelling control.    Therapy Documentation Precautions:  Precautions Precautions: Posterior Hip Precaution Booklet Issued: Yes (comment) Precaution Comments: Reviewed hip precautions with pt ; she stated 2/3 Restrictions Weight Bearing Restrictions: Yes LLE Weight Bearing: Partial weight bearing LLE Partial Weight Bearing Percentage or Pounds: 50% Other Position/Activity Restrictions: 50%  Pain: Pain Assessment Pain Assessment: 0-10 Pain Score: 7  Pain Type: Surgical pain Pain Location: Hip Pain Orientation: Left Pain Descriptors / Indicators: Aching Pain Frequency: Constant Pain Onset: Progressive Patients Stated Pain Goal: 3 Pain Intervention(s): Medication (See eMAR)   Locomotion : Ambulation Ambulation/Gait Assistance: 5: Supervision Wheelchair Mobility Distance: 100   See FIM for current functional status  Therapy/Group: Individual Therapy  Denice Bors 05/01/2015, 12:09 PM

## 2015-05-01 NOTE — Discharge Instructions (Signed)
Inpatient Rehab Discharge Instructions  [name] Discharge date and time:  05/04/15  Activities/Precautions/ Functional Status: Activity: no lifting, driving, or strenuous exercise till cleared by MD.  Diet: regular diet Wound Care: keep wound clean and dry Contact MD if you notice any redness, swelling, purulent/bloody drainage, fever or chills.   Functional status:  ___ No restrictions     ___ Walk up steps independently ___ 24/7 supervision/assistance   ___ Walk up steps with assistance ___ Intermittent supervision/assistance  ___ Bathe/dress independently ___ Walk with walker     ___ Bathe/dress with assistance ___ Walk Independently    ___ Shower independently ___ Walk with assistance    ___ Shower with assistance _X__ No alcohol     ___ Return to work/school ________  Special Instructions: 1. Maintain left total hip precautions. 50% weight on Left leg.     COMMUNITY REFERRALS UPON DISCHARGE:    Outpatient: PT  Agency:[facility] [phone]   Date of Last Service:05/04/2015  Appointment Date/Time:SCHEDULE WITH HUSBAND REGARDING WHEN AVAILABLE  Medical Equipment/Items [place]   [phone]  My questions have been answered and I understand these instructions. I will adhere to these goals and the provided educational materials after my discharge from the hospital.  Patient/Caregiver Signature _______________________________ Date __________  Clinician Signature _______________________________________ Date __________  Please bring this form and your medication list with you to all your follow-up doctor's appointments.

## 2015-05-01 NOTE — Progress Notes (Addendum)
Occupational Therapy Session Note  Patient Details  Name: [name] MRN: [id number] Date of Birth: [birth year]  Today's Date: 05/01/2015 OT Individual Time:  -       Short Term Goals: Week 1:  OT Short Term Goal 1 (Week 1): STG = LTGs due to ELOS  Skilled Therapeutic Interventions/Progress Updates:    Bathing and dressing shower level.  She was able to gather clothing for the shower using the RW with supervision and then ambulate to the 3:1 over the toilet.  All aspects of toileting completed with supervision as well.  She was able to perform all bathing with supervision and use of AE.  Min assist needed for donning the left shoe and tightening the laces.  All grooming tasks completed at modified independent level from the wheelchair.   Pt making great progress but still needs min assist for for recall of all THR precautions.   Therapy Documentation Precautions:  Precautions Precautions: Posterior Hip Precaution Booklet Issued: Yes (comment) Precaution Comments: Reviewed hip precautions with pt ; she stated 2/3 Restrictions Weight Bearing Restrictions: Yes LLE Weight Bearing: Partial weight bearing LLE Partial Weight Bearing Percentage or Pounds: 50% Other Position/Activity Restrictions: 50%  Pain: Pain Assessment Pain Assessment: 0-10 Pain Score: 2  Pain Type: Surgical pain Pain Orientation: Left Pain Intervention(s): Medication (See eMAR);Repositioned;Ambulation/increased activity ADL: See FIM for current functional status  Therapy/Group: Individual Therapy  Shamar Kracke OTR/L 05/01/2015, 9:51 AM

## 2015-05-02 ENCOUNTER — Inpatient Hospital Stay (HOSPITAL_COMMUNITY): Payer: 59 | Admitting: Physical Therapy

## 2015-05-02 ENCOUNTER — Inpatient Hospital Stay (HOSPITAL_COMMUNITY): Payer: 59 | Admitting: Occupational Therapy

## 2015-05-02 DIAGNOSIS — Z87440 Personal history of urinary (tract) infections: Secondary | ICD-10-CM

## 2015-05-02 LAB — CULTURE, BLOOD (ROUTINE X 2): Culture: NO GROWTH

## 2015-05-02 NOTE — Progress Notes (Signed)
Occupational Therapy Session Note  Patient Details  Name: [name] MRN: [id number] Date of Birth: Apr 28, 1951  Today's Date: 05/02/2015 OT Individual Time:  1130-1215  (45 min) 1st session:   OT Individual Time Calculation (min): 45 min   1615-1700   2nd session                                        Short Term Goals: Week 1:  OT Short Term Goal 1 (Week 1): STG = LTGs due to ELOS Week 2:     Skilled Therapeutic Interventions/Progress Updates:     1st session:  Addressed functional transfers, moibility, AE use, education regarding hip precautions.  Pt and husband in room.  Pt. Recalled 2/3 hip precautions and verbalized weight bearing rule.  Ambulated with RW to shower and transferred with SBA and verbal cues for technique.  Pt utilized AE for LB bathing and dressing with min educational cues..   Completed sit to stand and standing balance with SBA and using grab bar.  Husband observed but not involved in care during this session.  Pt. Left in wc at sink performing grooming.  Husband available to assist as needed.    2nd session:  Addressed mobility with RW, sit to stand, walker safety and techniques in kitchen.  Pt has 3/10 hip pain.  Practiced going from sit to stand with husband observing.  Cued pt on sliding LLE out in front so as not to break hip precautions.  Practice x5 but needs to review on future therapy sessions.  Pt ambulated in kitchen with RW and practice retrieving items butr did not prepare a simple meal.  Practiced using reaching and getting low items with hip extension strategy.  Pt had difficulty with hip extension strategy to reach low items, so recommend arrange items up high.  Pt. Practiced passing items from one counter to another.  Ambulated about 30 feet before needing sit rest break.  Propelled wc back to room and transferred to recliner with SBA.  Asked rehab tech to obtain reacher bag for reacher.    Therapy Documentation Precautions: Hip precautions; 560 % weight  bearing  Therapy Vitals Temp: 98.6 F (37 C) Temp Source: Oral Pulse Rate: 87 Resp: 18 BP: (!) 119/58 mmHg Patient Position (if appropriate): Sitting Oxygen Therapy SpO2: 97 % O2 Device: Not Delivered Pain:  3/10  Left hip for both sessions      Other Treatments:    See FIM for current functional status  Therapy/Group: Individual Therapy  Lisa Roca 05/02/2015, 5:01 PM

## 2015-05-02 NOTE — Progress Notes (Addendum)
Physical Therapy Session Note  Patient Details  Name: [name] MRN: [id number] Date of Birth: Jul 20, 1951  Today's Date: 05/02/2015 PT Individual Time: 1300-1345 PT Individual Time Calculation (min): 45 min   Short Term Goals: Week 1:  PT Short Term Goal 1 (Week 1): = LTGs due to LOS  Skilled Therapeutic Interventions/Progress Updates:  Pt was seen bedside in the pm. Pt sitting up in w/c. Pt propelled w/c about 150 feet with B UEs and S. Pt performed multiple transfers w/c to edge of mat, edge of mat to w/c with S and rolling walker with verbal cues. Pt performed multiple transfers edge of mat to supine, supine to edge of mat with S and verbal cues. Pt ambulated with rolling walker about 110 feet with S and verbal cues. Pt ascended/descended 4 stairs with 1 rail and min A with verbal cues. Pt propelled w/c back to room with B UEs and S. Pt transferred w/c to recliner with S and rolling walker with verbal cues.   Therapy Documentation Precautions:  Precautions Precautions: Posterior Hip Precaution Booklet Issued: Yes (comment) Precaution Comments: Reviewed hip precautions with pt ; she stated 2/3 Restrictions Weight Bearing Restrictions: Yes LLE Weight Bearing: Partial weight bearing LLE Partial Weight Bearing Percentage or Pounds: 50% Other Position/Activity Restrictions: 50% General:   Pain: No c/o pain.    Locomotion : Ambulation Ambulation/Gait Assistance: 5: Supervision   See FIM for current functional status  Therapy/Group: Individual Therapy  Dub Amis 05/02/2015, 2:58 PM

## 2015-05-02 NOTE — Progress Notes (Signed)
Physical Therapy Session Note  Patient Details  Name: [name] MRN: [id number] Date of Birth: 04/30/1951  Today's Date: 05/02/2015 PT Individual Time: 0900-1000 PT Individual Time Calculation (min): 60 min   Short Term Goals: Week 1:  PT Short Term Goal 1 (Week 1): = LTGs due to LOS  Skilled Therapeutic Interventions/Progress Updates:  Pt was seen bedside in the am sitting up in recliner. Pt able to recall 3/3 posterior hip precautions. Pt transferred recliner to w/c with rolling walker and S with verbal cues. Pt propelled w/c about 150 feet with B UEs and S. Pt transferred w/c to edge of mat with S and rolling walker. Pt transferred edge of mat to supine with S to min A and verbal cues. Pt performed 2 sets x 10 reps each, ankle pumps, heel slides, hip abd/add, SAQs, and knee presses. Pt transferred supine to edge of bed with min A and verbal cues. Pt transferred edge of mat to w/c with rolling walker and S. Raised mat to 31" to match pt's bed and placed sheet on mat to decrease friction. Pt transferred w/c to edge of mat with rolling walker and S. Pt transferred edge of mat to supine with S and verbal cues, pt cued to slide bottom further onto mattress until L LE is on the bed to level of knee and then pt able to slide it onto the mat without utilizing UEs to assist L LE or breaking hip precautions. Pt transferred supine to edge of mat with S and verbal cues. Pt transferred edge of mat to w/c with S and rolling walker. Pt transferred w/c to mat, mat to w/c with S and rolling walker. Pt transferred edge of mat to supine, supine to edge of mat with S and verbal cues. Pt ambulated about 60 feet with rolling walker and S. Pt propelled w/c about 50 feet with B UEs and S. Pt transferred w/c to recliner with rolling walker and S. Pt left sitting in reliner with call bell within reach.   Therapy Documentation Precautions:  Precautions Precautions: Posterior Hip Precaution Booklet Issued: Yes  (comment) Precaution Comments: Reviewed hip precautions with pt ; she stated 2/3 Restrictions Weight Bearing Restrictions: Yes LLE Weight Bearing: Partial weight bearing LLE Partial Weight Bearing Percentage or Pounds: 50% Other Position/Activity Restrictions: 50% General:   Pain: No c/o pain.    Locomotion : Ambulation Ambulation/Gait Assistance: 5: Supervision   See FIM for current functional status  Therapy/Group: Individual Therapy  Dub Amis 05/02/2015, 12:39 PM

## 2015-05-02 NOTE — Progress Notes (Signed)
64 y.o. female with history of left femur fracture treated with IM Nailing 11/2014 (with CIR for therapy) who has had increase in pain in the hip for the past few weeks with difficulty walking. 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 She was started on narcotics for pain management and underwent conversion to L-THR by Dr. Alvan Dame on 04/24/15. Post op 50% WB on LLE   Subjective/Complaints: No issues overntie,  Slept ok Pain comes and goes  ROS Neg except as above Objective: Vital Signs: Blood pressure 138/71, pulse 92, temperature 98.7 F (37.1 C), temperature source Oral, resp. rate 18, height _0  (1.549 m), weight 89.6 kg (197 lb 8.5 oz), SpO2 98 %. No results found. Results for orders placed or performed during the hospital encounter of 04/28/15 (from the past 72 hour(s))  CBC WITH DIFFERENTIAL     Status: Abnormal   Collection Time: 04/29/15  8:44 AM  Result Value Ref Range   WBC 14.6 (H) 4.0 - 10.5 K/uL   RBC 3.32 (L) 3.87 - 5.11 MIL/uL   Hemoglobin 9.0 (L) 12.0 - 15.0 g/dL   HCT 28.4 (L) 36.0 - 46.0 %   MCV 85.5 78.0 - 100.0 fL   MCH 27.1 26.0 - 34.0 pg   MCHC 31.7 30.0 - 36.0 g/dL   RDW 14.3 11.5 - 15.5 %   Platelets 585 (H) 150 - 400 K/uL   Neutrophils Relative % 69 43 - 77 %   Lymphocytes Relative 17 12 - 46 %   Monocytes Relative 9 3 - 12 %   Eosinophils Relative 4 0 - 5 %   Basophils Relative 1 0 - 1 %   Neutro Abs 10.1 (H) 1.7 - 7.7 K/uL   Lymphs Abs 2.5 0.7 - 4.0 K/uL   Monocytes Absolute 1.3 (H) 0.1 - 1.0 K/uL   Eosinophils Absolute 0.6 0.0 - 0.7 K/uL   Basophils Absolute 0.1 0.0 - 0.1 K/uL   RBC Morphology POLYCHROMASIA PRESENT    WBC Morphology MILD LEFT SHIFT (1-5% METAS, OCC MYELO, OCC BANDS)    Smear Review PLATELETS APPEAR INCREASED   Comprehensive metabolic panel     Status: Abnormal   Collection Time: 04/29/15  8:44 AM  Result Value Ref  Range   Sodium 132 (L) 135 - 145 mmol/L   Potassium 4.1 3.5 - 5.1 mmol/L   Chloride 95 (L) 101 - 111 mmol/L   CO2 24 22 - 32 mmol/L   Glucose, Bld 145 (H) 70 - 99 mg/dL   BUN 13 6 - 20 mg/dL   Creatinine, Ser 0.80 0.44 - 1.00 mg/dL   Calcium 9.0 8.9 - 10.3 mg/dL   Total Protein 6.3 (L) 6.5 - 8.1 g/dL   Albumin 2.8 (L) 3.5 - 5.0 g/dL   AST 33 15 - 41 U/L   ALT 51 14 - 54 U/L   Alkaline Phosphatase 88 38 - 126 U/L   Total Bilirubin 0.6 0.3 - 1.2 mg/dL   GFR calc non Af Amer >60 >60 mL/min   GFR calc Af Amer >60 >60 mL/min    Comment: (NOTE) The eGFR has been calculated using the CKD EPI equation. This calculation has not been validated in all clinical situations. eGFR's persistently <90 mL/min signify possible Chronic Kidney Disease.    Anion gap 13 5 - 15  Creatinine, serum     Status: None  Collection Time: 05/01/15  5:49 AM  Result Value Ref Range   Creatinine, Ser 0.82 0.44 - 1.00 mg/dL   GFR calc non Af Amer >60 >60 mL/min   GFR calc Af Amer >60 >60 mL/min    Comment: (NOTE) The eGFR has been calculated using the CKD EPI equation. This calculation has not been validated in all clinical situations. eGFR's persistently <60 mL/min signify possible Chronic Kidney Disease.      HEENT: normal Cardio: RRR and no murmur Resp: CTA B/L and unlabored GI: BS positive and NT, ND Extremity:  Pulses positive and No Edema Skin:   Intact and Wound Left hip CDI, Left thigh CDI Neuro: Alert/Oriented, Normal Sensory and Abnormal Motor 2- Left HF, 3- KE, 4/5 L adf, normal strength on R Musc/Skel:  Swelling Left hip Gen NAD   Assessment/Plan: 1. 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 Physiatrist is providing close team supervision and 24 hour management of active medical problems listed below. Physiatrist and rehab  team continue to assess barriers to discharge/monitor patient progress toward functional and medical goals.  FIM: FIM - Bathing Bathing Steps Patient Completed: Chest, Right Arm, Left Arm, Abdomen, Front perineal area, Buttocks, Right upper leg, Left upper leg, Right lower leg (including foot), Left lower leg (including foot) Bathing: 5: Supervision: Safety issues/verbal cues  FIM - Upper Body Dressing/Undressing Upper body dressing/undressing steps patient completed: Thread/unthread right bra strap, Thread/unthread left bra strap, Hook/unhook bra, Thread/unthread right sleeve of pullover shirt/dresss, Thread/unthread left sleeve of pullover shirt/dress, Put head through opening of pull over shirt/dress, Pull shirt over trunk Upper body dressing/undressing: 5: Set-up assist to: Obtain clothing/put away FIM - Lower Body Dressing/Undressing Lower body dressing/undressing steps patient completed: Pull underwear up/down, Pull pants up/down, Thread/unthread right underwear leg, Thread/unthread left underwear leg, Thread/unthread right pants leg, Don/Doff right sock, Thread/unthread left pants leg, Don/Doff left sock, Don/Doff right shoe Lower body dressing/undressing: 4: Min-Patient completed 75 plus % of tasks  FIM - Toileting Toileting steps completed by patient: Adjust clothing prior to toileting, Performs perineal hygiene, Adjust clothing after toileting Toileting Assistive Devices: Grab bar or rail for support Toileting: 5: Supervision: Safety issues/verbal cues  FIM - Radio producer Devices: Engineer, civil (consulting), Insurance account manager Transfers: 5-From toilet/BSC: Supervision (verbal cues/safety issues), 5-To toilet/BSC: Supervision (verbal cues/safety issues)  FIM - IT sales professional Transfer: 4: Supine > Sit: Min A (steadying Pt. > 75%/lift 1 leg), 4: Sit > Supine: Min A (steadying pt. > 75%/lift 1 leg), 5: Bed > Chair or W/C: Supervision (verbal cues/safety  issues), 5: Chair or W/C > Bed: Supervision (verbal cues/safety issues)  FIM - Locomotion: Wheelchair Distance: 150 Locomotion: Wheelchair: 5: Travels 150 ft or more: maneuvers on rugs and over door sills with supervision, cueing or coaxing FIM - Locomotion: Ambulation Locomotion: Ambulation Assistive Devices: Administrator Ambulation/Gait Assistance: 5: Supervision Locomotion: Ambulation: 2: Travels 50 - 149 ft with supervision/safety issues  Comprehension Comprehension Mode: Auditory Comprehension: 5-Follows basic conversation/direction: With extra time/assistive device  Expression Expression Mode: Verbal Expression: 5-Expresses basic 90% of the time/requires cueing < 10% of the time.  Social Interaction Social Interaction: 5-Interacts appropriately 90% of the time - Needs monitoring or encouragement for participation or interaction.  Problem Solving Problem Solving: 5-Solves basic problems: With no assist  Memory Memory: 5-Recognizes or recalls 90% of the time/requires  cueing < 10% of the time   Medical Problem List and Plan: 1. 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  DVT Prophylaxis/Anticoagulation: Pharmaceutical: Lovenox 3. Pain Management: Continue hydrocodone prn, Left Knee OA, add voltaren gel , last cortisone injection 1.5 yr ago 4. Mood: LCSW to follow for evaluation and support.   5. Neuropsych: This patient is capable of making decisions on her own behalf. 6. Skin/Wound Care: Routine pressure relief measures.    7. Fluids/Electrolytes/Nutrition: Monitor I/O.  Check lytes in am.   8. Reactive Leucocytosis: Monitor for signs of infection.   9. UTI: off abx , monitor clinically 10. ABLA: continue iron supplement. Recheck hgb 9.0 12. HTN: Monitor BP every shift. Continue Avapro.    LOS (Days) 4 A FACE TO FACE EVALUATION WAS PERFORMED  [name] E 05/02/2015, 8:04 AM

## 2015-05-03 ENCOUNTER — Inpatient Hospital Stay (HOSPITAL_COMMUNITY): Payer: 59 | Admitting: Physical Therapy

## 2015-05-03 ENCOUNTER — Inpatient Hospital Stay (HOSPITAL_COMMUNITY): Payer: 59 | Admitting: Occupational Therapy

## 2015-05-03 NOTE — Progress Notes (Addendum)
Physical Therapy Session Note  Patient Details  Name: [name] MRN: [id number] Date of Birth: 1951/02/22  Today's Date: 05/03/2015 PT Individual Time: 1300-1400 PT Individual Time Calculation (min): 60 min   Short Term Goals: Week 1:  PT Short Term Goal 1 (Week 1): = LTGs due to LOS  Skilled Therapeutic Interventions/Progress Updates:  Pt was seen bedside in the pm. Pt propelled w/c 150 feet with S and B UEs. Pt performed car transfers x 3, pt requires S and verbal cues to get into the car and min A to exit the car. Pt performed multiple transfers w/c to edge of bed, edge of bed to w/c with rolling walker and S. Pt transferred edge of mat to supine, supine to edge of mat with x 3 with S and verbal cues. Pt ambulated with rolling walker and S with occasional verbal cues for 100 feet. Pt ascended/descended 4 stairs with 1 rail S and verbal cues. Pt returned to room. Pt transferred from w/c to recliner with rolling walker and S assist of husband. Pt is able to state 3/3 hip precautions and WB status. Discussed with husband pt needs S at this time. Pt continues to require verbal cues at times during functional activity to prevent breaking precautions.   Therapy Documentation Precautions:  Precautions Precautions: Posterior Hip Precaution Booklet Issued: Yes (comment) Precaution Comments: Reviewed hip precautions with pt ; she stated 2/3 Restrictions Weight Bearing Restrictions: Yes LLE Weight Bearing: Partial weight bearing LLE Partial Weight Bearing Percentage or Pounds: 50% Other Position/Activity Restrictions: 50% General:   Pain: No c/o pain.   Mobility:   Locomotion : Ambulation Ambulation/Gait Assistance: 5: Supervision   See FIM for current functional status  Therapy/Group: Individual Therapy  Dub Amis 05/03/2015, 4:40 PM

## 2015-05-03 NOTE — Progress Notes (Signed)
Subjective/Complaints: Pt feels ready for D/C in am  ROS Neg except as above Objective: Vital Signs: Blood pressure 119/58, pulse 87, temperature 98.6 F (37 C), temperature source Oral, resp. rate 18, height $RemoveBe'5\' 1"'[id number]$  (1.549 m), weight 89.6 kg (197 lb 8.5 oz), SpO2 97 %. No results found. Results for orders placed or performed during the hospital encounter of 04/28/15 (from the past 72 hour(s))  Creatinine, serum     Status: None   Collection Time: 05/01/15  5:49 AM  Result Value Ref Range   Creatinine, Ser 0.82 0.44 - 1.00 mg/dL   GFR calc non Af Amer >60 >60 mL/min   GFR calc Af Amer >60 >60 mL/min    Comment: (NOTE) The eGFR has been calculated using the CKD EPI equation. This calculation has not been validated in all clinical situations. eGFR's persistently <60 mL/min signify possible Chronic Kidney Disease.      HEENT: normal Cardio: RRR and no murmur Resp: CTA B/L and unlabored GI: BS positive and NT, ND Extremity:  Pulses positive and No Edema Skin:   Intact and Wound Left hip CDI, Left thigh CDI Neuro: Alert/Oriented, Normal Sensory and Abnormal Motor 2- Left HF, 3- KE, 4/5 L adf, normal strength on R Musc/Skel:  Swelling Left hip Gen NAD   Assessment/Plan: 1. 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 Physiatrist is providing close team supervision and 24 hour management of active medical problems listed below. Physiatrist and rehab team continue to assess barriers to discharge/monitor patient progress toward functional and medical goals. Plan D/C in am FIM: FIM - Bathing Bathing Steps Patient Completed: Chest, Right Arm, Left Arm, Abdomen, Front perineal area, Buttocks, Right upper leg, Left upper leg, Right lower leg (including foot), Left lower leg (including foot) Bathing: 5: Supervision: Safety  issues/verbal cues  FIM - Upper Body Dressing/Undressing Upper body dressing/undressing steps patient completed: Thread/unthread right bra strap, Thread/unthread left bra strap, Hook/unhook bra, Thread/unthread right sleeve of pullover shirt/dresss, Thread/unthread left sleeve of pullover shirt/dress, Put head through opening of pull over shirt/dress, Pull shirt over trunk Upper body dressing/undressing: 5: Set-up assist to: Obtain clothing/put away FIM - Lower Body Dressing/Undressing Lower body dressing/undressing steps patient completed: Pull underwear up/down, Pull pants up/down, Thread/unthread right underwear leg, Thread/unthread left underwear leg, Thread/unthread right pants leg, Don/Doff right sock, Thread/unthread left pants leg, Don/Doff left sock, Don/Doff right shoe Lower body dressing/undressing: 4: Min-Patient completed 75 plus % of tasks  FIM - Toileting Toileting steps completed by patient: Adjust clothing prior to toileting, Performs perineal hygiene, Adjust clothing after toileting Toileting Assistive Devices: Grab bar or rail for support Toileting: 5: Supervision: Safety issues/verbal cues  FIM - Radio producer Devices: Engineer, civil (consulting), Insurance account manager Transfers: 5-From toilet/BSC: Supervision (verbal cues/safety issues), 5-To toilet/BSC: Supervision (verbal cues/safety issues)  FIM - Control and instrumentation engineer Devices: Arm rests, Copy: 5: Bed > Chair or W/C: Supervision (verbal cues/safety issues), 5: Chair or W/C > Bed: Supervision (verbal cues/safety issues), 5: Supine > Sit: Supervision (verbal cues/safety issues), 5: Sit > Supine: Supervision (verbal cues/safety issues)  FIM - Locomotion: Wheelchair Distance: 150 Locomotion: Wheelchair: 5: Travels 150 ft or more: maneuvers on rugs and over door sills with supervision, cueing or coaxing FIM - Locomotion: Ambulation Locomotion: Ambulation Assistive  Devices: Administrator Ambulation/Gait Assistance: 5: Supervision Locomotion:  Ambulation: 2: Travels 50 - 149 ft with supervision/safety issues  Comprehension Comprehension Mode: Auditory Comprehension: 5-Follows basic conversation/direction: With extra time/assistive device  Expression Expression Mode: Verbal Expression: 5-Expresses basic 90% of the time/requires cueing < 10% of the time.  Social Interaction Social Interaction: 5-Interacts appropriately 90% of the time - Needs monitoring or encouragement for participation or interaction.  Problem Solving Problem Solving: 5-Solves basic problems: With no assist  Memory Memory: 5-Recognizes or recalls 90% of the time/requires cueing < 10% of the time   Medical Problem List and Plan: 1. 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  DVT Prophylaxis/Anticoagulation: Pharmaceutical: Lovenox 3. Pain Management: Continue hydrocodone prn, Left Knee OA, add voltaren gel , last cortisone injection 1.5 yr ago 4. Mood: LCSW to follow for evaluation and support.   5. Neuropsych: This patient is capable of making decisions on her own behalf. 6. Skin/Wound Care: Routine pressure relief measures.    7. Fluids/Electrolytes/Nutrition: Monitor I/O.  Check lytes in am.   8. Reactive Leucocytosis: Monitor for signs of infection.   9. UTI: off abx , monitor clinically 10. ABLA: continue iron supplement. Recheck hgb 9.0 12. HTN: Monitor BP every shift. Continue Avapro.    LOS (Days) 5 A FACE TO FACE EVALUATION WAS PERFORMED  KIRSTEINS,ANDREW E 05/03/2015, 8:07 AM

## 2015-05-03 NOTE — Progress Notes (Signed)
Occupational Therapy Session Note  Patient Details  Name: [name] MRN: [id number] Date of Birth: 28-Dec-1950  Today's Date: 05/03/2015 OT Individual Time: 1000-1100 OT Individual Time Calculation (min): 60 min    Skilled Therapeutic Interventions/Progress Updates: utilized RW to go to toilet and then room shower from her recliner with supervision.  Patient appeared realatively safe and demonstrated Total Hip precautions during this session.   She required Mod A to use the long handled shoe horn and reacher to don her shoes. This clinician reminded her that she should leave her elastic shoe laces engaged and not loosened when she doffs them so that they will be laced when she goes to don them the next time.  (She had unlaced them to doff them prior)     Therapy Documentation Precautions:  Precautions Precautions: Posterior Hip Precaution Booklet Issued: Yes (comment) Precaution Comments: Reviewed hip precautions with pt ; she stated 2/3 Restrictions Weight Bearing Restrictions: Yes LLE Weight Bearing: Partial weight bearing LLE Partial Weight Bearing Percentage or Pounds: 50% Other Position/Activity Restrictions: 50%  Pain: Pain Assessment Pain Assessment: 0-10 Pain Score: 0-No pain Faces Pain Scale: No hurt Pain Type: Surgical pain Pain Location: Hip Pain Orientation: Left Pain Descriptors / Indicators: Aching Pain Intervention(s): Medication (See eMAR) PAINAD (Pain Assessment in Advanced Dementia) Breathing: normal See FIM for current functional status  Therapy/Group: Individual Therapy  [facility] 05/03/2015, 3:43 PM

## 2015-05-04 DIAGNOSIS — Z9660 Presence of unspecified orthopedic joint implant: Secondary | ICD-10-CM

## 2015-05-04 MED ORDER — FAMOTIDINE 10 MG PO TABS: 10.0000 mg | ORAL_TABLET | Freq: Two times a day (BID) | ORAL | Status: DC

## 2015-05-04 MED ORDER — FERROUS SULFATE 325 (65 FE) MG PO TABS: 325.0000 mg | ORAL_TABLET | Freq: Every day | ORAL | Status: DC

## 2015-05-04 MED ORDER — DICLOFENAC SODIUM 1 % TD GEL: 2.0000 g | Freq: Four times a day (QID) | TRANSDERMAL | Status: DC

## 2015-05-04 MED ORDER — HYDROCODONE-ACETAMINOPHEN 5-325 MG PO TABS: 1.0000 | ORAL_TABLET | Freq: Four times a day (QID) | ORAL | Status: DC | PRN

## 2015-05-04 MED ORDER — LEVOTHYROXINE SODIUM 88 MCG PO TABS: 88.0000 ug | ORAL_TABLET | Freq: Every day | ORAL | Status: DC

## 2015-05-04 MED ORDER — LORATADINE 10 MG PO TABS: 10.0000 mg | ORAL_TABLET | Freq: Every day | ORAL | Status: DC

## 2015-05-04 MED ORDER — METHOCARBAMOL 500 MG PO TABS: 500.0000 mg | ORAL_TABLET | Freq: Four times a day (QID) | ORAL | Status: DC | PRN

## 2015-05-04 NOTE — Progress Notes (Signed)
Pt discharged home with husband. Discharge instructions provided by Kathrynn Speed, PA. All questions answered. Pt verbalized understanding. Pt escorted off unit in w/c with personal belonging by Lavella Lemons, NT.

## 2015-05-04 NOTE — Progress Notes (Signed)
Subjective/Complaints:  Pt feels  ok ROS Neg except as above Objective: Vital Signs: Blood pressure 133/71, pulse 85, temperature 97.7 F (36.5 C), temperature source Oral, resp. rate 17, height 5\' 1"  (1.549 m), weight 89.6 kg (197 lb 8.5 oz), SpO2 98 %. No results found. No results found for this or any previous visit (from the past 72 hour(s)).   HEENT: normal Cardio: RRR and no murmur Resp: CTA B/L and unlabored GI: BS hypoactive and NT, ND Extremity:  Pulses positive and No Edema Skin:   Intact and Wound Left hip CDI, Left thigh CDI Neuro: Alert/Oriented, Normal Sensory and Abnormal Motor 2- Left HF, 3- KE, 4/5 L adf, normal strength on R Musc/Skel:  Swelling Left hip Gen NAD   Assessment/Plan: 1. 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  Stable for D/C today F/u PCP in 1-2 weeks F/u ortho this week See D/C summary See D/C instructions FIM: FIM - Bathing Bathing Steps Patient Completed: Chest, Right Arm, Left Arm, Abdomen, Front perineal area, Buttocks, Right upper leg, Left upper leg, Right lower leg (including foot), Left lower leg (including foot) Bathing: 5: Supervision: Safety issues/verbal cues  FIM - Upper Body Dressing/Undressing Upper body dressing/undressing steps patient completed: Thread/unthread right bra strap, Hook/unhook bra, Thread/unthread left bra strap, Thread/unthread right sleeve of pullover shirt/dresss, Put head through opening of pull over shirt/dress, Thread/unthread left sleeve of pullover shirt/dress, Pull shirt over trunk Upper body dressing/undressing: 5: Supervision: Safety issues/verbal cues FIM - Lower Body Dressing/Undressing Lower body dressing/undressing steps patient completed: Thread/unthread right pants leg, Pull pants up/down, Fasten/unfasten pants, Thread/unthread left pants leg, Don/Doff right shoe (did not wear socks) Lower body dressing/undressing: 4: Min-Patient  completed 75 plus % of tasks  FIM - Toileting Toileting steps completed by patient: Adjust clothing prior to toileting, Performs perineal hygiene, Adjust clothing after toileting Toileting Assistive Devices: Grab bar or rail for support Toileting: 0: Activity did not occur  FIM - Radio producer Devices: Insurance account manager Transfers: 6-To toilet/ BSC, 5-From toilet/BSC: Supervision (verbal cues/safety issues)  FIM - Control and instrumentation engineer Devices: Arm rests, Copy: 5: Supine > Sit: Supervision (verbal cues/safety issues), 5: Sit > Supine: Supervision (verbal cues/safety issues), 5: Bed > Chair or W/C: Supervision (verbal cues/safety issues), 5: Chair or W/C > Bed: Supervision (verbal cues/safety issues)  FIM - Locomotion: Wheelchair Distance: 150 Locomotion: Wheelchair: 5: Travels 150 ft or more: maneuvers on rugs and over door sills with supervision, cueing or coaxing FIM - Locomotion: Ambulation Locomotion: Ambulation Assistive Devices: Administrator Ambulation/Gait Assistance: 5: Supervision Locomotion: Ambulation: 2: Travels 50 - 149 ft with supervision/safety issues  Comprehension Comprehension Mode: Auditory Comprehension: 5-Understands basic 90% of the time/requires cueing < 10% of the time  Expression Expression Mode: Verbal Expression: 5-Expresses complex 90% of the time/cues < 10% of the time  Social Interaction Social Interaction: 5-Interacts appropriately 90% of the time - Needs monitoring or encouragement for participation or interaction.  Problem Solving Problem Solving: 5-Solves basic problems: With no assist  Memory Memory: 5-Recognizes or recalls 90% of the time/requires cueing < 10% of the time   Medical Problem List and Plan: 1. 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  DVT Prophylaxis/Anticoagulation:  Pharmaceutical: Lovenox 3. Pain Management: Continue hydrocodone prn, Left Knee OA, add voltaren gel , last cortisone injection 1.5 yr ago 4. Mood: LCSW to  follow for evaluation and support.   5. Neuropsych: This patient is capable of making decisions on her own behalf. 6. Skin/Wound Care: Routine pressure relief measures.    7. Fluids/Electrolytes/Nutrition: Monitor I/O.  Check lytes in am.   8. Reactive Leucocytosis: Monitor for signs of infection.   9. UTI: off abx , monitor clinically 10. ABLA: continue iron supplement. Recheck hgb 9.0 12. HTN: Monitor BP every shift. Continue Avapro.    LOS (Days) 6 A FACE TO FACE EVALUATION WAS PERFORMED  KIRSTEINS,ANDREW E 05/04/2015, 7:51 AM

## 2015-05-04 NOTE — Progress Notes (Signed)
Social Work Discharge Note Discharge Note  The overall goal for the admission was met for:   Discharge location: [place]   Length of Stay: Yes-6 DAYS  Discharge activity level: Yes-MOD/I LEVEL  Home/community participation: Yes  Services provided included: MD, RD, PT, OT, RN, CM, Pharmacy and [place]: Private Insurance: [place]  Follow-up services arranged: Outpatient: [facility]-TO CONTACT HUSBAND TO SET UP APPT  Comments (or additional information):PT DID WELL AND REACHED HER GOALS SOONER-JUST HERE IN DEC 2015. HAS ALL DME AND PREFERS OP AT [facility]  Patient/Family verbalized understanding of follow-up arrangements: Yes  Individual responsible for coordination of the follow-up plan: PATIENT & RONALD-HUSBAND  Confirmed correct DME delivered: Elease Hashimoto 05/04/2015    Elease Hashimoto

## 2015-05-04 NOTE — Progress Notes (Signed)
Occupational Therapy Discharge Summary  Patient Details  Name: [name] MRN: [id number] Date of Birth: 1951/08/16    Patient has met 8 of 10 long term goals due to improved activity tolerance, improved balance and ability to compensate for deficits.  Patient to discharge at overall Supervision to modified independent level.  Patient's care partner is independent to provide the necessary physical and cognitive assistance at discharge.    Reasons goals not met: Pt continues to need min assist for dressing the left foot and for lifting the LLE over the edge of the tub using a tub bench.    Recommendation:  No further OT needs at this time as pt's spouse has been educated on safe assistance with selfcare tasks in previous sessions and will be able to assist.  Feel pt's further needs will be better addressed through PT services.   Equipment: No equipment provided  Reasons for discharge: treatment goals met and discharge from hospital  Patient/family agrees with progress made and goals achieved: Yes  OT Discharge Precautions/Restrictions  Precautions Precautions: Posterior Hip Precaution Comments: Reviewed hip precautions with pt ; she stated 2/3 Restrictions Weight Bearing Restrictions: Yes LLE Weight Bearing: Partial weight bearing LLE Partial Weight Bearing Percentage or Pounds: 50% Other Position/Activity Restrictions: 50%  Pain Pain Assessment Pain Assessment: 0-10 Pain Score: 0-No pain ADL  See FIM scale for details  Vision/Perception  Vision- History Baseline Vision/History: Wears glasses Wears Glasses: At all times Patient Visual Report: No change from baseline Vision- Assessment Vision Assessment?: No apparent visual deficits  Cognition Overall Cognitive Status: Within Functional Limits for tasks assessed Orientation Level: Oriented X4 Attention: Selective;Sustained Sustained Attention: Appears intact Selective Attention: Appears intact Memory:  Impaired Memory Impairment: Storage deficit;Decreased recall of new information Awareness: Appears intact Problem Solving: Appears intact Safety/Judgment: Appears intact Comments: Pt still with decreased ability to recall all THR precautions.  Consistently able to follow 2/3 precautions.  Sensation Sensation Light Touch: Appears Intact Hot/Cold: Appears Intact Proprioception: Appears Intact Coordination Gross Motor Movements are Fluid and Coordinated: Yes (In the UEs) Fine Motor Movements are Fluid and Coordinated: Yes Motor  Motor Motor: Abnormal postural alignment and control Motor - Discharge Observations: Pt with increased trunk flexion in standing and with mobility.  Mobility  Bed Mobility Bed Mobility: Supine to Sit Supine to Sit: 6: Modified independent (Device/Increase time);HOB flat;Other (comment) (Pt used the leg lifter to assist with moving the LLE on and off of the bed. ) Transfers Transfers: Sit to Stand;Stand to Sit Sit to Stand: 6: Modified independent (Device/Increase time);With upper extremity assist;With armrests;From chair/3-in-1 Stand to Sit: 6: Modified independent (Device/Increase time);With armrests;To chair/3-in-1  Trunk/Postural Assessment  Cervical Assessment Cervical Assessment: Within Functional Limits Thoracic Assessment Thoracic Assessment: Within Functional Limits Lumbar Assessment Lumbar Assessment: Within Functional Limits Postural Control Postural Control: Deficits on evaluation Postural Limitations: Noted trunk flexion in standing and with mobility.  Balance Balance Balance Assessed: Yes Static Sitting Balance Static Sitting - Balance Support: Feet supported Static Sitting - Level of Assistance: 6: Modified independent (Device/Increase time) Dynamic Sitting Balance Dynamic Sitting - Balance Support: Feet supported Dynamic Sitting - Level of Assistance: 6: Modified independent (Device/Increase time) Static Standing Balance Static  Standing - Balance Support: Right upper extremity supported;Left upper extremity supported Static Standing - Level of Assistance: 6: Modified independent (Device/Increase time) Dynamic Standing Balance Dynamic Standing - Balance Support: Right upper extremity supported;Left upper extremity supported Dynamic Standing - Level of Assistance: 6: Modified independent (Device/Increase time) (during selfcare tasks) Extremity/Trunk Assessment  RUE Assessment RUE Assessment: Within Functional Limits LUE Assessment LUE Assessment: Within Functional Limits  See FIM for current functional status  Cayman Kielbasa OTR/L 05/04/2015, 8:42 AM

## 2015-05-04 NOTE — Progress Notes (Signed)
Physical Therapy Discharge Summary  Patient Details  Name: [name] MRN: [id number] Date of Birth: [birth year]  Today's Date: 05/04/2015   -     Patient has met 7 of 12 long term goals due to improved activity tolerance, improved postural control, increased strength, ability to compensate for deficits, functional use of  left lower extremity and improved awareness.  Patient to discharge at an ambulatory level, after transfer, with modified independence in the home. Due to pain and apparent anxiety, pt needed VCS at times for safe bed mobility and transfers in the hospital setting.  Once standing, she is safe to ambulate in her home.   Patient's care partner is independent to provide the necessary cognitive assistance at discharge.  Reasons goals not met: reduced memory for new information, pain  Recommendation:  Patient will benefit from ongoing skilled PT services in outpatient setting to continue to advance safe functional mobility, address ongoing impairments in strength, pain, balance, precautions, activity tolerance, and minimize fall risk.  Equipment: rental w/c with basic cushion  Reasons for discharge: discharge from hospital  Patient/family agrees with progress made and goals achieved: Yes  PT Discharge Precautions/Restrictions Precautions Precautions: Posterior Hip Precaution Comments: Reviewed hip precautions with pt ; she stated 2/3, but functionally adheres to 3/3 except for the task of sit> supine on a 31" high bed, as per home; cues to avoid using her UEs to lift her LLE onto the bed, breaking hip flexion precaution Restrictions Weight Bearing Restrictions: Yes LLE Weight Bearing: Partial weight bearing LLE Partial Weight Bearing Percentage or Pounds: 50% Other Position/Activity Restrictions: 50%   Pain no pain at rest   Vision/Perception no change from baseline    Cognition- limited memory for new information   Sensation Sensation Light Touch: Appears  Intact Proprioception: Appears Intact Coordination Gross Motor Movements are Fluid and Coordinated: No Fine Motor Movements are Fluid and Coordinated: No Heel Shin Test: NT Motor  Motor Motor: Abnormal postural alignment and control Motor - Discharge Observations: Pt with increased trunk flexion in standing and with mobility.   Mobility Bed Mobility Bed Mobility: Sit to Supine Supine to Sit: 6: Modified independent (Device/Increase time) Sit to Supine: 5: Supervision Sit to Supine - Details: Verbal cues for precautions/safety (on high bed as per home) Transfers Transfers: Yes Sit to Stand: 6: Modified independent (Device/Increase time);With upper extremity assist;With armrests;From chair/3-in-1 Stand to Sit: 6: Modified independent (Device/Increase time);With armrests;To chair/3-in-1 Stand Pivot Transfers: supervision;With armrests;Other (comment) (with RW) Locomotion  Ambulation Ambulation: Yes Ambulation/Gait Assistance: 5: Supervision Ambulation Distance (Feet): 110 Feet Assistive device: Rolling walker Gait Gait: Yes Gait Pattern: Impaired Gait Pattern: Decreased step length - left;Trunk flexed;Wide base of support;Abducted- right (LLE " toes in") Gait velocity: decreased Stairs / Additional Locomotion Stairs: Yes Stairs Assistance: supervision Stair Management Technique: Two rails Number of Stairs: 5 Height of Stairs: 7 Wheelchair Mobility Wheelchair Mobility: Yes Wheelchair Assistance: 6: Modified independent (Device/Increase time) Environmental health practitioner: Both upper extremities Wheelchair Parts Management: Needs assistance Distance: 150  Trunk/Postural Assessment  Cervical Assessment Cervical Assessment: Within Functional Limits Thoracic Assessment Thoracic Assessment: Within Functional Limits Lumbar Assessment Lumbar Assessment: Within Functional Limits Postural Control Postural Control: Deficits on evaluation Postural Limitations: Noted trunk flexion in  standing and with mobility.  Balance Balance Balance Assessed: Yes Static Sitting Balance Static Sitting - Balance Support: Feet supported Static Sitting - Level of Assistance: 6: Modified independent (Device/Increase time) Dynamic Sitting Balance Dynamic Sitting - Balance Support: Feet supported Dynamic Sitting - Level of Assistance:  6: Modified independent (Device/Increase time) Static Standing Balance Static Standing - Balance Support: Right upper extremity supported;Left upper extremity supported Static Standing - Level of Assistance: 6: Modified independent (Device/Increase time) Dynamic Standing Balance Dynamic Standing - Balance Support: Right upper extremity supported;Left upper extremity supported Dynamic Standing - Level of Assistance: 6: Modified independent (Device/Increase time) (during selfcare tasks) Extremity Assessment      RLE Assessment RLE Assessment: Within Functional Limits (strength SFLS) LLE Assessment LLE Assessment: Not tested at d/c  See FIM for current functional status  Argelio Granier 05/04/2015, 5:29 PM

## 2015-05-04 NOTE — Plan of Care (Signed)
Problem: RH Dressing Goal: LTG Patient will perform lower body dressing w/assist (OT) LTG: Patient will perform lower body dressing with assist, with/without cues in positioning using equipment (OT)  Outcome: Not Met (add Reason) She continues to need min assist for donning her left shoe.  Problem: RH Tub/Shower Transfers Goal: LTG Patient will perform tub/shower transfers w/assist (OT) LTG: Patient will perform tub/shower transfers with assist, with/without cues using equipment (OT)  Outcome: Not Met (add Reason) Needs min assist for lifting the LLE over the edge of the tub.

## 2015-05-08 ENCOUNTER — Encounter: Payer: Self-pay | Admitting: Rehabilitation

## 2015-05-08 ENCOUNTER — Ambulatory Visit: Payer: 59 | Attending: Orthopedic Surgery | Admitting: Rehabilitation

## 2015-05-08 DIAGNOSIS — R29898 Other symptoms and signs involving the musculoskeletal system: Secondary | ICD-10-CM

## 2015-05-08 DIAGNOSIS — M629 Disorder of muscle, unspecified: Secondary | ICD-10-CM | POA: Diagnosis not present

## 2015-05-08 DIAGNOSIS — M25552 Pain in left hip: Secondary | ICD-10-CM

## 2015-05-08 DIAGNOSIS — R262 Difficulty in walking, not elsewhere classified: Secondary | ICD-10-CM | POA: Diagnosis not present

## 2015-05-08 DIAGNOSIS — R269 Unspecified abnormalities of gait and mobility: Secondary | ICD-10-CM

## 2015-05-08 NOTE — Therapy (Signed)
[address] [place], [place], [address] Phone: [phone]   Fax:  [phone]  Physical Therapy Evaluation  Patient Details  Name: [name] MRN: [id number] Date of Birth: 02-Aug-1951 Referring Provider:  Paralee Cancel, MD  Encounter Date: 05/08/2015      PT End of Session - 05/08/15 1111    Visit Number 1   Date for PT Re-Evaluation 07/08/15   Authorization Type [employer]   Authorization Time Period 37 visit/yr   PT Start Time 1015   PT Stop Time 1115   PT Time Calculation (min) 60 min   Activity Tolerance Patient tolerated treatment well      Past Medical History  Diagnosis Date  . Chickenpox   . Genital warts   . Environmental allergies   . HTN (hypertension)   . Hyperlipidemia   . Thyroid disease   . Thyroid nodule   . Hypothyroidism   . Complication of anesthesia     "hard to wake up:  Marland Kitchen Family history of adverse reaction to anesthesia     "sister & mom are hard to wake up:  . Heart murmur   . 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 Carpal tunnel release Bilateral 1990's  . Cystectomy      From the cervix  . Intramedullary (im) nail intertrochanteric Left 12/15/2014    Procedure: INTRAMEDULLARY (IM) NAIL INTERTROCHANTRIC;  Surgeon: Augustin Schooling, MD;  Location: [facility];  Service: Orthopedics;  Laterality: Left;  . Fracture surgery    . Total hip arthroplasty Left 04/24/2015    Procedure:  TOTAL HIP ARTHROPLASTY AND REMOVAL OF IM AFFIXUS NAIL ;  Surgeon: Paralee Cancel, MD;  Location: [facility];  Service: Orthopedics;  Laterality: Left;    There were no vitals filed for this visit.  Visit Diagnosis:  Abnormality of gait - Plan: PT plan of care cert/re-cert  Weakness of left hip - Plan: PT plan of care cert/re-cert  Pain in hip joint, left - Plan: PT plan of care cert/re-cert      Subjective Assessment  - 05/08/15 1025    Subjective Pt. reports had sx 4/29 to convert failed L hip ORIF to THR.    She is 50% WB LLE and saw PA yesterday and had good report.    Returns in 3 weeks.   Incision well healing.     Patient is accompained by: Family member   Pertinent History Pt. had fx of the IM rod as well as nonunion peritrochanteric fragments   Currently in Pain? Yes   Pain Score 5    Pain Location Hip   Pain Descriptors / Indicators Aching   Pain Type Acute pain;Surgical pain            [facility] PT Assessment - 05/08/15 0001    Assessment   Medical Diagnosis L THA   Onset Date 04/24/15   Next MD Visit 3 wk   Precautions   Precautions Posterior Hip   Restrictions   Weight Bearing Restrictions Yes   LLE Weight Bearing Partial weight bearing   LLE Partial Weight Bearing Percentage or Pounds 50%   Balance Screen   Has the patient fallen in the past 6 months Yes   How many times? 1   Has the patient had a decrease in activity level because of a fear of falling?  Yes  [place] Private residence   Living Arrangements Spouse/significant other   Additional Comments 3 steps to enter   Prior Function   Level of Independence Independent with homemaking with ambulation   Strength   Overall Strength Comments unable to SLR independently   Bed Mobility   Bed Mobility Sit to Supine   Supine to Sit 6: Modified independent (Device/Increase time)   Sit to Supine 6: Modified independent (Device/Increase time)   Transfers   Transfers Yes   Sit to Stand 6: Modified independent (Device/Increase time)   Stand to Sit 6: Modified independent (Device/Increase time)   Stand Pivot Transfers 6: Modified independent (Device/Increase time)   Ambulation/Gait   Ambulation/Gait Yes   Ambulation/Gait Assistance 6: Modified independent (Device/Increase time)   Ambulation Distance (Feet) 200 Feet   Assistive device Rolling walker   Gait Pattern Step-to pattern   Ambulation Surface  Level   Balance   Balance Assessed Yes   Static Standing Balance   Static Standing - Balance Support Bilateral upper extremity supported                           PT Education - 05/08/15 1110    Education provided Yes   Education Details posterior hip precautions, step to gait pattern with RW to maintain 50% WB LLE   Person(s) Educated Patient;Spouse   Methods Demonstration;Explanation   Comprehension Returned demonstration          PT Short Term Goals - 05/08/15 1115    PT SHORT TERM GOAL #1   Title independent with HEP   Baseline not independent   Time 4   Period Weeks   Status New           PT Long Term Goals - 05/08/15 1123    PT LONG TERM GOAL #1   Title Patient will be able to independently SLR with LLE   Baseline unable   Time 8   Period Weeks   Status New   PT LONG TERM GOAL #2   Title Patient will report 50% subjective decrease in pain with 3-4/10 worst rating   Baseline 8/10 worst   Time 8   Period Weeks   Status New               Plan - 05/08/15 1112    Clinical Impression Statement Pt. will benefit from 1 visit/wk for now.   When she is FWB LLE we will increase to 2-3x/wk.   PT is necessary to review, advance her home exercises and for futher gait training/ safety teaching   Pt will benefit from skilled therapeutic intervention in order to improve on the following deficits Abnormal gait;Decreased strength;Pain   Rehab Potential Excellent   PT Frequency 1x / week   PT Duration 4 weeks   PT Treatment/Interventions ADLs/Self Care Home Management;Electrical Stimulation;Gait training;Therapeutic exercise;Patient/family education;Moist Heat;Cryotherapy   PT Next Visit Plan advance HEP with leg cuff wts, ? nu step without resistance and within hip restrictions/below 90 deg end range flexion   PT Home Exercise Plan standing exercises LLE only due to 50% precaution   Recommended Other Services no   Consulted and Agree with Plan of  Care Patient;Family member/caregiver         Problem List Patient Active Problem List   Diagnosis Date Noted  . Gluten intolerance 04/27/2015  . Postoperative anemia due to acute blood loss 04/27/2015  . Closed hip fracture 04/22/2015  .  Leg edema, left 04/22/2015  . Acute blood loss anemia 12/23/2014  . Fracture, intertrochanteric, left femur 12/18/2014  . Fall   . Subtrochanteric fracture of femur 12/16/2014  . UTI (urinary tract infection) 12/16/2014  . Leukocytosis 12/16/2014  . Hip fracture requiring operative repair   . Closed left subtrochanteric femur fracture 12/15/2014  . Lesion of right humerus 12/15/2014  . Osteoporosis, unspecified 10/15/2013  . HTN (hypertension) 07/16/2013  . Hypothyroidism 07/16/2013  . Osteopenia 07/16/2013  . Environmental allergies 07/16/2013  . Obesity (BMI 30-39.9) 07/16/2013    Dorothy Polhemus , PT  05/08/2015, 11:27 AM  [address] [place], [place], [address] Phone: [phone]   Fax:  [phone]

## 2015-05-14 ENCOUNTER — Ambulatory Visit: Payer: 59 | Admitting: Physical Therapy

## 2015-05-14 DIAGNOSIS — R29898 Other symptoms and signs involving the musculoskeletal system: Secondary | ICD-10-CM

## 2015-05-14 DIAGNOSIS — M25552 Pain in left hip: Secondary | ICD-10-CM

## 2015-05-14 DIAGNOSIS — R262 Difficulty in walking, not elsewhere classified: Secondary | ICD-10-CM | POA: Diagnosis not present

## 2015-05-14 NOTE — Discharge Summary (Signed)
Physician Discharge Summary  Patient ID: [name] MRN: [id number] DOB/AGE: Apr 03, 1951 64 y.o.  Admit date: 04/28/2015 Discharge date: 05/14/2015  Discharge Diagnoses:  Principal Problem:   Closed left subtrochanteric femur fracture Active Problems:   Postoperative anemia due to acute blood loss   Discharged Condition: Stable  Labs:  Basic Metabolic Panel: BMP Latest Ref Rng 05/01/2015 04/29/2015 04/27/2015  Glucose 70 - 99 mg/dL - 145(H) 154(H)  BUN 6 - 20 mg/dL - 13 9  Creatinine 0.44 - 1.00 mg/dL 0.82 0.80 0.62  Sodium 135 - 145 mmol/L - 132(L) 133(L)  Potassium 3.5 - 5.1 mmol/L - 4.1 3.9  Chloride 101 - 111 mmol/L - 95(L) 100(L)  CO2 22 - 32 mmol/L - 24 27  Calcium 8.9 - 10.3 mg/dL - 9.0 8.5(L)     CBC: CBC Latest Ref Rng 04/29/2015 04/28/2015 04/27/2015  WBC 4.0 - 10.5 K/uL 14.6(H) 13.9(H) 18.0(H)  Hemoglobin 12.0 - 15.0 g/dL 9.0(L) 8.0(L) 8.2(L)  Hematocrit 36.0 - 46.0 % 28.4(L) 24.4(L) 24.5(L)  Platelets 150 - 400 K/uL 585(H) 406(H) 303     CBG: No results for input(s): GLUCAP in the last 168 hours.  Brief HPI:   [name] is a 64 y.o. female with history of left femur fracture treated with IM Nailing 11/2014 (with CIR for therapy) who has had increase in pain in the hip for the past few weeks with difficulty walking. 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 She was started on narcotics for pain management and underwent conversion to L-THR by Dr. Alvan Dame on 04/24/15. Post op 50% WB on LLE and on SCDs for DVT prophylaxis. post op with ABLA with drop in H/H to 8.0 as well as fever with T- 102 with leucocytosis due to UTI. She defervesced on septra. Therapy has been ongoing and CIR was recommended for follow up therapy.    Hospital Course: [name] was admitted to rehab 04/28/2015 for inpatient therapies to consist of PT and OT at least three hours  five days a week. Past admission physiatrist, therapy team and rehab RN have worked together to provide customized collaborative inpatient rehab. Lovenox was added for DVT prophylaxis. Left knee OA was managed with use of Voltaren gel.  Follow up labs revealed improvement in H/H and elevated WBC is resolving.  Left hip incision is healing well without signs or symptoms of infection. Urine culture showed no growth therefore antibiotic was discontinued. Blood pressures as well as hip pain has been well controlled and she has been afebrile during her stay. She has made good progress during her rehab stay and is supervision to modified independent with RW at discharge. She will continue to receive follow up outpatient physical therapy at [facility] past discharge.    Rehab course: During patient's stay in rehab weekly team conferences were held to monitor patient's progress, set goals and discuss barriers to discharge. At admission, patient required total assistance with self care tasks and moderate assistance with mobility. She has had improvement in activity tolerance, balance, postural control, as well as ability to compensate for deficits. She is able to complete bathing and UB dressing with supervision and requires min assist with LB dressing. She requires supervision for transfers and ambulation. She is able to ambulate 46' with RW and is able to navigate 4 stairs with supervision.     Disposition: 01-Home or Self Care  Diet: Regular.   Special Instructions: 1. Partial weight on LLE. Maintain Left total hip precautions.     Medication List    STOP taking these medications        enoxaparin 40 MG/0.4ML injection  Commonly known as:  LOVENOX     sulfamethoxazole-trimethoprim 800-160 MG per tablet  Commonly known as:  BACTRIM DS,SEPTRA DS      TAKE these medications        acetaminophen 325 MG tablet  Commonly known as:  TYLENOL  Take 2 tablets (650 mg total) by mouth  every 6 (six) hours as needed.     azelastine 0.1 % nasal spray  Commonly known as:  ASTELIN  Place 1 spray into both nostrils 2 (two) times daily. Use in each nostril as directed     bisacodyl 10 MG suppository  Commonly known as:  DULCOLAX  Place 1 suppository (10 mg total) rectally daily as needed for moderate constipation.     diclofenac sodium 1 % Gel  Commonly known as:  VOLTAREN  Apply 2 g topically 4 (four) times daily.     docusate sodium 100 MG capsule  Commonly known as:  COLACE  Take 1 capsule (100 mg total) by mouth 2 (two) times daily.     famotidine 10 MG tablet  Commonly known as:  PEPCID  Take 1 tablet (10 mg total) by mouth 2 (two) times daily.     ferrous sulfate 325 (65 FE) MG tablet  Take 1 tablet (325 mg total) by mouth daily with breakfast.     fluticasone 50 MCG/ACT nasal spray  Commonly known as:  FLONASE  Place 2 sprays into both nostrils daily.     HYDROcodone-acetaminophen 5-325 MG per tablet--Rx # 90 pills   Commonly known as:  NORCO/VICODIN  Take 1-2 tablets by mouth every 6 (six) hours as needed for moderate pain.     levothyroxine 88 MCG tablet  Commonly known as:  SYNTHROID  Take 1 tablet (88 mcg total) by mouth daily.     loratadine 10 MG tablet  Commonly known as:  CLARITIN  Take 1 tablet (10 mg total) by mouth daily.     methocarbamol 500 MG tablet  Commonly known as:  ROBAXIN  Take 1 tablet (500 mg total) by mouth every 6 (six) hours as needed for muscle spasms.     ondansetron 4 MG tablet  Commonly known as:  ZOFRAN  Take 1 tablet (4 mg total) by mouth every 8 (eight) hours as needed for nausea or vomiting.     polyethylene glycol packet  Commonly known as:  MIRALAX / GLYCOLAX  Take 17 g by mouth daily as needed for mild constipation.     valsartan 320 MG tablet  Commonly known as:  DIOVAN  TAKE 1 TABLET DAILY     VITAMIN B-12 PO  Take 1 tablet by mouth daily.     Vitamin D (Ergocalciferol) 50000 UNITS Caps capsule   Commonly known as:  DRISDOL  Take 50,000 Units by mouth 3 (three) times a week.           Follow-up Information    Follow up with Jerlyn Ly, MD.   Specialty:  Internal Medicine   Why:  MD OFFICE ARRANGES OWN APPOINTMENT-WILL CONTACT PT AT HOME   Contact information:   [address] [place] [address] [phone]       Follow up with Mauri Pole, MD. Call today.   Specialty:  Orthopedic Surgery  Why:  for follow up appointment   Contact information:   [address] [place] [address] [phone]       Call Charlett Blake, MD.   Specialty:  Physical Medicine and Rehabilitation   Why:  As needed   Contact information:   [place] [address] [phone]       Signed: Bary Leriche 05/14/2015, 6:18 PM

## 2015-05-14 NOTE — Therapy (Signed)
[place] [address] [place], [place], [address] Phone: [phone]   Fax:  [phone]  Physical Therapy Treatment  Patient Details  Name: [name] MRN: [id number] Date of Birth: [birth year] Referring Provider:  Paralee Cancel, MD  Encounter Date: 05/14/2015      PT End of Session - 05/14/15 1521    Visit Number 2   Number of Visits 16   Date for PT Re-Evaluation 07/08/15   Authorization Type [employer]   Authorization Time Period 22 visit/yr   PT Start Time 1449   PT Stop Time 1542   PT Time Calculation (min) 53 min   Activity Tolerance Patient tolerated treatment well      Past Medical History  Diagnosis Date  . Chickenpox   . Genital warts   . Environmental allergies   . HTN (hypertension)   . Hyperlipidemia   . Thyroid disease   . Thyroid nodule   . Hypothyroidism   . Complication of anesthesia     "hard to wake up:  Marland Kitchen Family history of adverse reaction to anesthesia     "sister & mom are hard to wake up:  . Heart murmur   . 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 Carpal tunnel release Bilateral 1990's  . Cystectomy      From the cervix  . Intramedullary (im) nail intertrochanteric Left 12/15/2014    Procedure: INTRAMEDULLARY (IM) NAIL INTERTROCHANTRIC;  Surgeon: Augustin Schooling, MD;  Location: [facility];  Service: Orthopedics;  Laterality: Left;  . Fracture surgery    . Total hip arthroplasty Left 04/24/2015    Procedure:  TOTAL HIP ARTHROPLASTY AND REMOVAL OF IM AFFIXUS NAIL ;  Surgeon: Paralee Cancel, MD;  Location: [facility];  Service: Orthopedics;  Laterality: Left;    There were no vitals filed for this visit.  Visit Diagnosis:  Weakness of left hip  Pain in hip joint, left  Left hip pain      Subjective Assessment - 05/14/15 1447    Subjective not too bad today. took otc meds for pain around 12:00. Working  on new exercises at home. going well.    Patient is accompained by: Family member   Limitations Sitting;Standing;Walking;House hold activities   Patient Stated Goals walk without pain and be normal, I was walking daily for exercise   Currently in Pain? Yes   Pain Score 4    Pain Location Hip   Pain Orientation Left   Pain Descriptors / Indicators Aching   Aggravating Factors  not sure   Pain Relieving Factors ice, meds, elevating                         [facility] Adult PT Treatment/Exercise - 05/14/15 0001    Knee/Hip Exercises: Standing   Heel Raises 10 reps   Other Standing Knee Exercises toe lunges fwd with RW support   Other Standing Knee Exercises Hip Abd gentle  with RW support   Knee/Hip Exercises: Seated   Long Arc Quad 2 sets;10 reps   Other Seated Knee Exercises Hams curls combinined with LAQ s seated 2X10   Modalities   Modalities Electrical Stimulation;Cryotherapy   Cryotherapy   Number Minutes Cryotherapy 15 Minutes   Cryotherapy Location Hip   Type of Cryotherapy Ice pack   Electrical Stimulation   Electrical Stimulation Location hip  Electrical Stimulation Goals Pain                  PT Short Term Goals - 05/08/15 1115    PT SHORT TERM GOAL #1   Title independent with HEP   Baseline not independent   Time 4   Period Weeks   Status New           PT Long Term Goals - 05/08/15 1123    PT LONG TERM GOAL #1   Title Patient will be able to independently SLR with LLE   Baseline unable   Time 8   Period Weeks   Status New   PT LONG TERM GOAL #2   Title Patient will report 50% subjective decrease in pain with 3-4/10 worst rating   Baseline 8/10 worst   Time 8   Period Weeks   Status New               Plan - 05/14/15 1523    Clinical Impression Statement patient fatigued easily today with clinic ther ex. she was very nervous about Nu step today, we did not utilize it today.    Pt will benefit from skilled therapeutic  intervention in order to improve on the following deficits Abnormal gait;Decreased strength;Pain   Rehab Potential Excellent   PT Frequency 1x / week   PT Duration 4 weeks   PT Treatment/Interventions ADLs/Self Care Home Management;Electrical Stimulation;Gait training;Therapeutic exercise;Patient/family education;Moist Heat;Cryotherapy   PT Next Visit Plan advance HEP with leg cuff wts, ? nu step without resistance and within hip restrictions/below 90 deg end range flexion   PT Home Exercise Plan standing exercises LLE only due to 50% precaution   Consulted and Agree with Plan of Care Patient;Family member/caregiver        Problem List Patient Active Problem List   Diagnosis Date Noted  . Gluten intolerance 04/27/2015  . Postoperative anemia due to acute blood loss 04/27/2015  . Closed hip fracture 04/22/2015  . Leg edema, left 04/22/2015  . Acute blood loss anemia 12/23/2014  . Fracture, intertrochanteric, left femur 12/18/2014  . Fall   . Subtrochanteric fracture of femur 12/16/2014  . UTI (urinary tract infection) 12/16/2014  . Leukocytosis 12/16/2014  . Hip fracture requiring operative repair   . Closed left subtrochanteric femur fracture 12/15/2014  . Lesion of right humerus 12/15/2014  . Osteoporosis, unspecified 10/15/2013  . HTN (hypertension) 07/16/2013  . Hypothyroidism 07/16/2013  . Osteopenia 07/16/2013  . Environmental allergies 07/16/2013  . Obesity (BMI 30-39.9) 07/16/2013     Natividad Brood, PTA  05/14/2015, 3:26 PM  [address] [place], [place], [address] Phone: [phone]   Fax:  [phone]

## 2015-05-15 ENCOUNTER — Encounter (HOSPITAL_COMMUNITY): Payer: Self-pay | Admitting: Orthopedic Surgery

## 2015-05-21 ENCOUNTER — Encounter: Payer: Self-pay | Admitting: Physical Therapy

## 2015-05-21 ENCOUNTER — Ambulatory Visit: Payer: 59 | Admitting: Physical Therapy

## 2015-05-21 DIAGNOSIS — R262 Difficulty in walking, not elsewhere classified: Secondary | ICD-10-CM | POA: Diagnosis not present

## 2015-05-21 DIAGNOSIS — M25552 Pain in left hip: Secondary | ICD-10-CM

## 2015-05-21 NOTE — Therapy (Signed)
[address] [place], [place], [address] Phone: [phone]   Fax:  [phone]  Physical Therapy Treatment  Patient Details  Name: [name] MRN: [id number] Date of Birth: [birth year] Referring Provider:  Paralee Cancel, MD  Encounter Date: 05/21/2015      PT End of Session - 05/21/15 1604    Visit Number 3   Number of Visits 12   Date for PT Re-Evaluation 07/08/15   Authorization Type [employer]   PT Start Time 1525   PT Stop Time 1630   PT Time Calculation (min) 65 min      Past Medical History  Diagnosis Date  . Chickenpox   . Genital warts   . Environmental allergies   . HTN (hypertension)   . Hyperlipidemia   . Thyroid disease   . Thyroid nodule   . Hypothyroidism   . Complication of anesthesia     "hard to wake up:  Marland Kitchen Family history of adverse reaction to anesthesia     "sister & mom are hard to wake up:  . Heart murmur   . 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 Carpal tunnel release Bilateral 1990's  . Cystectomy      From the cervix  . Intramedullary (im) nail intertrochanteric Left 12/15/2014    Procedure: INTRAMEDULLARY (IM) NAIL INTERTROCHANTRIC;  Surgeon: Augustin Schooling, MD;  Location: [facility];  Service: Orthopedics;  Laterality: Left;  . Fracture surgery    . Total hip arthroplasty Left 04/24/2015    Procedure:  TOTAL HIP ARTHROPLASTY AND REMOVAL OF IM AFFIXUS NAIL ;  Surgeon: Paralee Cancel, MD;  Location: [facility];  Service: Orthopedics;  Laterality: Left;    There were no vitals filed for this visit.  Visit Diagnosis:  Left hip pain      Subjective Assessment - 05/21/15 1531    Subjective seeming to be getting some better, pain not as bad. MD next Thursday 6/3, continues with PWB   Currently in Pain? No/denies   Pain Score 0-No pain            [facility] PT Assessment - 05/21/15 0001    ROM  / Strength   AROM / PROM / Strength Strength   Strength   Strength Assessment Site Hip   Right/Left Hip Left   Left Hip Flexion 3-/5   Left Hip Extension 3-/5   Left Hip ABduction 3-/5   Left Hip ADduction 3/5                     [facility] Adult PT Treatment/Exercise - 05/21/15 0001    Knee/Hip Exercises: Aerobic   Elliptical Nustep L1 66min   Knee/Hip Exercises: Standing   Other Standing Knee Exercises red tband left only hip flex,ext,abd and marching 15 times each   Other Standing Knee Exercises 6inch step touch fwd and lateral 2 sets 10 with RW Left only   Knee/Hip Exercises: Seated   Long Arc Quad Strengthening;Left;2 sets;10 reps  red tband   Other Seated Knee Exercises seated HS red tband 2 sets 10   Other Seated Knee Exercises red tband hip abd 3 sets 10, add ball squeeze 3 sec hold 20 times   Knee/Hip Exercises: Supine   Terminal Knee Extension Strengthening;Left;2 sets;10 reps   Bridges Limitations 3 sets 10 with ball, KTC 2 sets 10  difficulty  with bridge as fatigued   Straight Leg Raises Strengthening;2 sets;4 sets;5 reps  SLR with abd Left   Knee/Hip Exercises: Sidelying   Hip ABduction Strengthening;Left;2 sets;10 reps   Clams 2 sets 10 Left   Cryotherapy   Number Minutes Cryotherapy 15 Minutes   Cryotherapy Location Hip   Type of Cryotherapy Ice pack   Electrical Stimulation   Electrical Stimulation Location hip   Electrical Stimulation Parameters IFC   Electrical Stimulation Goals Edema;Pain                  PT Short Term Goals - 05/21/15 1610    PT SHORT TERM GOAL #1   Title independent with HEP   Status Achieved   PT SHORT TERM GOAL #2   Title walk with walker with step through gait   Status Achieved           PT Long Term Goals - 05/08/15 1123    PT LONG TERM GOAL #1   Title Patient will be able to independently SLR with LLE   Baseline unable   Time 8   Period Weeks   Status New   PT LONG TERM GOAL #2   Title Patient  will report 50% subjective decrease in pain with 3-4/10 worst rating   Baseline 8/10 worst   Time 8   Period Weeks   Status New               Plan - 05/21/15 1611    Clinical Impression Statement pt with increased tolerance to ther ex and increased mobility   PT Next Visit Plan seeing MFD next week, assess any new weight bearing orders        Problem List Patient Active Problem List   Diagnosis Date Noted  . Gluten intolerance 04/27/2015  . Postoperative anemia due to acute blood loss 04/27/2015  . Closed hip fracture 04/22/2015  . Leg edema, left 04/22/2015  . Acute blood loss anemia 12/23/2014  . Fracture, intertrochanteric, left femur 12/18/2014  . Fall   . Subtrochanteric fracture of femur 12/16/2014  . UTI (urinary tract infection) 12/16/2014  . Leukocytosis 12/16/2014  . Hip fracture requiring operative repair   . Closed left subtrochanteric femur fracture 12/15/2014  . Lesion of right humerus 12/15/2014  . Osteoporosis, unspecified 10/15/2013  . HTN (hypertension) 07/16/2013  . Hypothyroidism 07/16/2013  . Osteopenia 07/16/2013  . Environmental allergies 07/16/2013  . Obesity (BMI 30-39.9) 07/16/2013    PAYSEUR,ANGIE PTA 05/21/2015, 4:12 PM  [address] [place], [place], [address] Phone: [phone]   Fax:  [phone]

## 2015-06-01 ENCOUNTER — Ambulatory Visit: Payer: 59 | Attending: Orthopedic Surgery | Admitting: Physical Therapy

## 2015-06-01 DIAGNOSIS — R262 Difficulty in walking, not elsewhere classified: Secondary | ICD-10-CM | POA: Insufficient documentation

## 2015-06-01 DIAGNOSIS — M629 Disorder of muscle, unspecified: Secondary | ICD-10-CM | POA: Diagnosis present

## 2015-06-01 DIAGNOSIS — R29898 Other symptoms and signs involving the musculoskeletal system: Secondary | ICD-10-CM | POA: Insufficient documentation

## 2015-06-01 DIAGNOSIS — M25552 Pain in left hip: Secondary | ICD-10-CM

## 2015-06-01 DIAGNOSIS — R269 Unspecified abnormalities of gait and mobility: Secondary | ICD-10-CM | POA: Insufficient documentation

## 2015-06-01 DIAGNOSIS — M6289 Other specified disorders of muscle: Secondary | ICD-10-CM

## 2015-06-01 NOTE — Therapy (Signed)
[place] [address] [place], [place], [address] Phone: [phone]   Fax:  [phone]  Physical Therapy Treatment  Patient Details  Name: [name] MRN: [id number] Date of Birth: 18-Sep-1951 Referring Provider:  Paralee Cancel, MD  Encounter Date: 06/01/2015      PT End of Session - 06/01/15 1606    Visit Number 4   Number of Visits 12   Date for PT Re-Evaluation 07/08/15   PT Start Time 1532   PT Stop Time 1616   PT Time Calculation (min) 44 min   Equipment Utilized During Treatment Gait belt   Activity Tolerance Patient tolerated treatment well   Behavior During Therapy [facility] for tasks assessed/performed      Past Medical History  Diagnosis Date  . Chickenpox   . Genital warts   . Environmental allergies   . HTN (hypertension)   . Hyperlipidemia   . Thyroid disease   . Thyroid nodule   . Hypothyroidism   . Complication of anesthesia     "hard to wake up:  Marland Kitchen Family history of adverse reaction to anesthesia     "sister & mom are hard to wake up:  . Heart murmur   . 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 Carpal tunnel release Bilateral 1990's  . Cystectomy      From the cervix  . Intramedullary (im) nail intertrochanteric Left 12/15/2014    Procedure: INTRAMEDULLARY (IM) NAIL INTERTROCHANTRIC;  Surgeon: Augustin Schooling, MD;  Location: [facility];  Service: Orthopedics;  Laterality: Left;  . Fracture surgery    . Total hip arthroplasty Left 04/24/2015    Procedure:  TOTAL HIP ARTHROPLASTY AND REMOVAL OF IM AFFIXUS NAIL ;  Surgeon: Paralee Cancel, MD;  Location: [facility];  Service: Orthopedics;  Laterality: Left;    There were no vitals filed for this visit.  Visit Diagnosis:  Left hip pain  Weakness of left hip  Pain in hip joint, left  Abnormality of gait  Difficulty walking  Leg fascia tightness       Subjective Assessment - 06/01/15 1535    Subjective Allowed to WBAT now after MD visit; needs to use RW and can progress to cane/no device in ~ 6 weeks   Limitations Sitting;Standing;Walking;House hold activities   Currently in Pain? No/denies            [facility] PT Assessment - 06/01/15 0001    Restrictions   Weight Bearing Restrictions Yes   LLE Weight Bearing Weight bearing as tolerated                     [facility] Adult PT Treatment/Exercise - 06/01/15 1539    Ambulation/Gait   Ambulation/Gait Yes   Ambulation/Gait Assistance 5: Supervision   Ambulation/Gait Assistance Details overall good sequencing and posture with gait with SPC; working on progression from [employer] (Feet) 250 Feet   Assistive device Straight cane   Gait Pattern Step-to pattern;Decreased stance time - left   Ambulation Surface Level;Indoor   Knee/Hip Exercises: Aerobic   Elliptical Nustep L5 39min   Knee/Hip Exercises: Standing   Heel Raises 10 reps   Heel Raises Limitations with toe raises   Functional Squat 10 reps   Functional Squat Limitations min cues for technique   Other Standing Knee Exercises standing hip flexion, abdct, ext; knee  flexion x 10 bil with bil UE support   Modalities   Modalities Electrical Stimulation;Cryotherapy   Cryotherapy   Number Minutes Cryotherapy 15 Minutes   Cryotherapy Location Hip   Type of Cryotherapy Ice pack   Electrical Stimulation   Electrical Stimulation Location L hip   Electrical Stimulation Action IFC   Electrical Stimulation Parameters to tolerance x 15 min   Electrical Stimulation Goals Edema;Pain                PT Education - 06/01/15 1606    Education provided Yes   Education Details begin household ambulation with single point cane and supervision   Person(s) Educated Spouse;Patient   Methods Explanation   Comprehension Verbalized understanding          PT Short Term Goals - 05/21/15 1610    PT SHORT TERM  GOAL #1   Title independent with HEP   Status Achieved   PT SHORT TERM GOAL #2   Title walk with walker with step through gait   Status Achieved           PT Long Term Goals - 05/08/15 1123    PT LONG TERM GOAL #1   Title Patient will be able to independently SLR with LLE   Baseline unable   Time 8   Period Weeks   Status New   PT LONG TERM GOAL #2   Title Patient will report 50% subjective decrease in pain with 3-4/10 worst rating   Baseline 8/10 worst   Time 8   Period Weeks   Status New               Plan - 06/01/15 1606    Clinical Impression Statement Able to progress to WBAT today and initiated gait with SPC.  Pt demonstrated good sequencing with SPC and safe to begin short distances in home to increase strength, balance and confidence with SPC.   PT Next Visit Plan progress strengthening; pt now WBAT   Consulted and Agree with Plan of Care Patient;Family member/caregiver        Problem List Patient Active Problem List   Diagnosis Date Noted  . Gluten intolerance 04/27/2015  . Postoperative anemia due to acute blood loss 04/27/2015  . Closed hip fracture 04/22/2015  . Leg edema, left 04/22/2015  . Acute blood loss anemia 12/23/2014  . Fracture, intertrochanteric, left femur 12/18/2014  . Fall   . Subtrochanteric fracture of femur 12/16/2014  . UTI (urinary tract infection) 12/16/2014  . Leukocytosis 12/16/2014  . Hip fracture requiring operative repair   . Closed left subtrochanteric femur fracture 12/15/2014  . Lesion of right humerus 12/15/2014  . Osteoporosis, unspecified 10/15/2013  . HTN (hypertension) 07/16/2013  . Hypothyroidism 07/16/2013  . Osteopenia 07/16/2013  . Environmental allergies 07/16/2013  . Obesity (BMI 30-39.9) 07/16/2013   Laureen Abrahams, PT, DPT 06/01/2015 4:25 PM  [address] [place], [place], [address] Phone: [phone]   Fax:   [phone]

## 2015-06-03 ENCOUNTER — Ambulatory Visit: Payer: 59 | Admitting: Physical Therapy

## 2015-06-03 ENCOUNTER — Encounter: Payer: Self-pay | Admitting: Physical Therapy

## 2015-06-03 DIAGNOSIS — R29898 Other symptoms and signs involving the musculoskeletal system: Secondary | ICD-10-CM

## 2015-06-03 DIAGNOSIS — R262 Difficulty in walking, not elsewhere classified: Secondary | ICD-10-CM

## 2015-06-03 DIAGNOSIS — M25552 Pain in left hip: Secondary | ICD-10-CM

## 2015-06-03 NOTE — Therapy (Signed)
[address] [place], [place], [address] Phone: [phone]   Fax:  [phone]  Physical Therapy Treatment  Patient Details  Name: [name] MRN: [id number] Date of Birth: [birth year] Referring Provider:  Paralee Cancel, MD  Encounter Date: 06/03/2015      PT End of Session - 06/03/15 1105    Visit Number 5   Number of Visits 12   Date for PT Re-Evaluation 07/08/15   PT Start Time 1016   PT Stop Time 1118   PT Time Calculation (min) 62 min      Past Medical History  Diagnosis Date  . Chickenpox   . Genital warts   . Environmental allergies   . HTN (hypertension)   . Hyperlipidemia   . Thyroid disease   . Thyroid nodule   . Hypothyroidism   . Complication of anesthesia     "hard to wake up:  Marland Kitchen Family history of adverse reaction to anesthesia     "sister & mom are hard to wake up:  . Heart murmur   . 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 Carpal tunnel release Bilateral 1990's  . Cystectomy      From the cervix  . Intramedullary (im) nail intertrochanteric Left 12/15/2014    Procedure: INTRAMEDULLARY (IM) NAIL INTERTROCHANTRIC;  Surgeon: Augustin Schooling, MD;  Location: [facility];  Service: Orthopedics;  Laterality: Left;  . Fracture surgery    . 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  Visit Diagnosis:  Left hip pain  Weakness of left hip  Difficulty walking      Subjective Assessment - 06/03/15 1024    Subjective feeling so good- amb in with [facility]   Currently in Pain? No/denies                         [facility] Adult PT Treatment/Exercise - 06/03/15 0001    Ambulation/Gait   Ambulation/Gait Yes   Ambulation/Gait Assistance 5: Supervision  with cuing   Ambulation/Gait Assistance Details working on upright posture and arm swing   Ambulation Distance (Feet) 250 Feet   Assistive device --  no AD   Gait Pattern Step-to pattern;Decreased stance time - left   Ambulation Surface Level;Indoor   Knee/Hip Exercises: Aerobic   Elliptical Nustep L5 27min   Knee/Hip Exercises: Machines for Strengthening   Cybex Knee Extension 15# 3x10   Cybex Knee Flexion 25# 3x10; 35# x10   Cybex Leg Press 40# 3x10   Knee/Hip Exercises: Standing   Lateral Step Up Left;2 sets;10 reps  4 inch   Forward Step Up Left;2 sets;10 reps  4 inch   Functional Squat 10 reps   Functional Squat Limitations min cues for technique of equal weight on LE   Other Standing Knee Exercises standing bilateral hip flex,ext and abd 15 reps   Other Standing Knee Exercises HHA side stepping with red tband 15 feet 3 times each way   Knee/Hip Exercises: Seated   Other Seated Knee Exercises blue tband hip abd 2 sets 15, add ball squeeze 3 sec hold 20 times   Knee/Hip Exercises: Supine  Straight Leg Raises Strengthening;Left;2 sets;10 reps  SLR with hip abd                  PT Short Term Goals - 05/21/15 1610    PT SHORT TERM GOAL #1   Title independent with HEP   Status Achieved   PT SHORT TERM GOAL #2   Title walk with walker with step through gait   Status Achieved           PT Long Term Goals - 06/03/15 1047    PT LONG TERM GOAL #1   Title Patient will be able to independently SLR with LLE   Status Achieved   PT LONG TERM GOAL #2   Title Patient will report 50% subjective decrease in pain with 3-4/10 worst rating   Status On-going   PT LONG TERM GOAL #3   Title no pain with ADL's   Status On-going   PT LONG TERM GOAL #4   Title put on shoes and socks independently   Baseline limited by precautions   Status On-going               Plan - 06/03/15 1115    Clinical  Impression Statement pt tolerated ther ex well, some pain in add with ther ex but stopped with end of exercise, discussed weaning off walker, but use as needed and okay to amb as comfortable without AD in home. trial without modalities   PT Next Visit Plan assess gait and progress        Problem List Patient Active Problem List   Diagnosis Date Noted  . Gluten intolerance 04/27/2015  . Postoperative anemia due to acute blood loss 04/27/2015  . Closed hip fracture 04/22/2015  . Leg edema, left 04/22/2015  . Acute blood loss anemia 12/23/2014  . Fracture, intertrochanteric, left femur 12/18/2014  . Fall   . Subtrochanteric fracture of femur 12/16/2014  . UTI (urinary tract infection) 12/16/2014  . Leukocytosis 12/16/2014  . Hip fracture requiring operative repair   . Closed left subtrochanteric femur fracture 12/15/2014  . Lesion of right humerus 12/15/2014  . Osteoporosis, unspecified 10/15/2013  . HTN (hypertension) 07/16/2013  . Hypothyroidism 07/16/2013  . Osteopenia 07/16/2013  . Environmental allergies 07/16/2013  . Obesity (BMI 30-39.9) 07/16/2013    Zellie Jenning,ANGIE PTA 06/03/2015, 11:16 AM  [address] [place], [place], [address] Phone: [phone]   Fax:  [phone]

## 2015-06-09 ENCOUNTER — Encounter: Payer: Self-pay | Admitting: Physical Therapy

## 2015-06-09 ENCOUNTER — Ambulatory Visit: Payer: 59 | Admitting: Physical Therapy

## 2015-06-09 DIAGNOSIS — M25552 Pain in left hip: Secondary | ICD-10-CM

## 2015-06-09 DIAGNOSIS — R262 Difficulty in walking, not elsewhere classified: Secondary | ICD-10-CM

## 2015-06-09 DIAGNOSIS — R29898 Other symptoms and signs involving the musculoskeletal system: Secondary | ICD-10-CM

## 2015-06-09 NOTE — Therapy (Signed)
[address] [place], [place], [address] Phone: [phone]   Fax:  [phone]  Physical Therapy Treatment  Patient Details  Name: [name] MRN: [id number] Date of Birth: Sep 19, 1951 Referring Provider:  Paralee Cancel, MD  Encounter Date: 06/09/2015      PT End of Session - 06/09/15 1438    Visit Number 6   Number of Visits 12   Date for PT Re-Evaluation 07/08/15   PT Start Time 1330   PT Stop Time 1430   PT Time Calculation (min) 60 min      Past Medical History  Diagnosis Date  . Chickenpox   . Genital warts   . Environmental allergies   . HTN (hypertension)   . Hyperlipidemia   . Thyroid disease   . Thyroid nodule   . Hypothyroidism   . Complication of anesthesia     "hard to wake up:  Marland Kitchen Family history of adverse reaction to anesthesia     "sister & mom are hard to wake up:  . Heart murmur   . 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 Carpal tunnel release Bilateral 1990's  . Cystectomy      From the cervix  . Intramedullary (im) nail intertrochanteric Left 12/15/2014    Procedure: INTRAMEDULLARY (IM) NAIL INTERTROCHANTRIC;  Surgeon: Augustin Schooling, MD;  Location: [facility];  Service: Orthopedics;  Laterality: Left;  . Fracture surgery    . Total hip arthroplasty Left 04/24/2015    Procedure:  TOTAL HIP ARTHROPLASTY AND REMOVAL OF IM AFFIXUS NAIL ;  Surgeon: Paralee Cancel, MD;  Location: [place];  Service: Orthopedics;  Laterality: Left;    There were no vitals filed for this visit.  Visit Diagnosis:  Left hip pain  Difficulty walking  Weakness of left hip      Subjective Assessment - 06/09/15 1434    Subjective every day is getting better   Currently in Pain? Yes   Pain Score 2    Pain Location Hip   Pain Orientation Left   Pain Descriptors / Indicators Aching                          [facility] Adult PT Treatment/Exercise - 06/09/15 0001    Knee/Hip Exercises: Aerobic   Elliptical Nustep L5 72min   Knee/Hip Exercises: Machines for Strengthening   Cybex Knee Extension 10# 2 sets 10  left only   Cybex Knee Flexion 15# 2 sets 10   left only   Cybex Leg Press 40# 3x10  1 set toes up , 1 set ER   Knee/Hip Exercises: Standing   Lateral Step Up Left;2 sets;10 reps  4 inch   Forward Step Up Left;2 sets;10 reps  4 inch   Wall Squat 15 reps   Walking with Sports Cord 20# 5 times fwd/back, 3 times each way  min A d/t LOB   Other Standing Knee Exercises HHA red tband alt hip flex,ext and abd 10 times each   Other Standing Knee Exercises HHA side stepping with red tband 15 feet 3 times each way   Knee/Hip Exercises: Supine   Other Supine Knee Exercises bridge with ball,KTC and obligues 2 sets 10   Other Supine Knee Exercises hip addct stretch 5x10 sec  PT Short Term Goals - 05/21/15 1610    PT SHORT TERM GOAL #1   Title independent with HEP   Status Achieved   PT SHORT TERM GOAL #2   Title walk with walker with step through gait   Status Achieved           PT Long Term Goals - 06/03/15 1047    PT LONG TERM GOAL #1   Title Patient will be able to independently SLR with LLE   Status Achieved   PT LONG TERM GOAL #2   Title Patient will report 50% subjective decrease in pain with 3-4/10 worst rating   Status On-going   PT LONG TERM GOAL #3   Title no pain with ADL's   Status On-going   PT LONG TERM GOAL #4   Title put on shoes and socks independently   Baseline limited by precautions   Status On-going               Plan - 06/09/15 1438    Clinical Impression Statement pt with improved tolerance to advanced ther ex, imporved strength as noted but increased ease of activity. Gait with toes fwd vs IR.   PT Next Visit Plan gait outside, gait without AD        Problem List Patient  Active Problem List   Diagnosis Date Noted  . Gluten intolerance 04/27/2015  . Postoperative anemia due to acute blood loss 04/27/2015  . Closed hip fracture 04/22/2015  . Leg edema, left 04/22/2015  . Acute blood loss anemia 12/23/2014  . Fracture, intertrochanteric, left femur 12/18/2014  . Fall   . Subtrochanteric fracture of femur 12/16/2014  . UTI (urinary tract infection) 12/16/2014  . Leukocytosis 12/16/2014  . Hip fracture requiring operative repair   . Closed left subtrochanteric femur fracture 12/15/2014  . Lesion of right humerus 12/15/2014  . Osteoporosis, unspecified 10/15/2013  . HTN (hypertension) 07/16/2013  . Hypothyroidism 07/16/2013  . Osteopenia 07/16/2013  . Environmental allergies 07/16/2013  . Obesity (BMI 30-39.9) 07/16/2013    Laurna Shetley,ANGIE PTA 06/09/2015, 2:40 PM  [address] [place], [place], [address] Phone: [phone]   Fax:  [phone]

## 2015-06-11 ENCOUNTER — Ambulatory Visit: Payer: 59 | Admitting: Physical Therapy

## 2015-06-11 ENCOUNTER — Encounter: Payer: Self-pay | Admitting: Physical Therapy

## 2015-06-11 DIAGNOSIS — R29898 Other symptoms and signs involving the musculoskeletal system: Secondary | ICD-10-CM

## 2015-06-11 DIAGNOSIS — R262 Difficulty in walking, not elsewhere classified: Secondary | ICD-10-CM

## 2015-06-11 DIAGNOSIS — M25552 Pain in left hip: Secondary | ICD-10-CM | POA: Diagnosis not present

## 2015-06-11 NOTE — Therapy (Signed)
[address] [place], [place], [address] Phone: [phone]   Fax:  [phone]  Physical Therapy Treatment  Patient Details  Name: [name] MRN: [id number] Date of Birth: 1951-07-01 Referring Provider:  Paralee Cancel, MD  Encounter Date: 06/11/2015      PT End of Session - 06/11/15 1457    Visit Number 7   Number of Visits 12   Date for PT Re-Evaluation 07/08/15   PT Start Time 1400   PT Stop Time 1500   PT Time Calculation (min) 60 min      Past Medical History  Diagnosis Date  . Chickenpox   . Genital warts   . Environmental allergies   . HTN (hypertension)   . Hyperlipidemia   . Thyroid disease   . Thyroid nodule   . Hypothyroidism   . Complication of anesthesia     "hard to wake up:  Marland Kitchen Family history of adverse reaction to anesthesia     "sister & mom are hard to wake up:  . Heart murmur   . 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 Carpal tunnel release Bilateral 1990's  . Cystectomy      From the cervix  . Intramedullary (im) nail intertrochanteric Left 12/15/2014    Procedure: INTRAMEDULLARY (IM) NAIL INTERTROCHANTRIC;  Surgeon: Augustin Schooling, MD;  Location: [facility];  Service: Orthopedics;  Laterality: Left;  . Fracture surgery    . Total hip arthroplasty Left 04/24/2015    Procedure:  TOTAL HIP ARTHROPLASTY AND REMOVAL OF IM AFFIXUS NAIL ;  Surgeon: Paralee Cancel, MD;  Location: [place] [facility];  Service: Orthopedics;  Laterality: Left;    There were no vitals filed for this visit.  Visit Diagnosis:  Difficulty walking  Weakness of left hip      Subjective Assessment - 06/11/15 1414    Subjective very tired after last session but felt okay   Currently in Pain? Yes   Pain Score 2    Pain Location Hip   Pain Orientation Left                         [facility] Adult PT Treatment/Exercise  - 06/11/15 0001    Knee/Hip Exercises: Aerobic   Elliptical Elliptical 4 min I 3 R 3   Tread Mill Nustep L 5 6 min   Knee/Hip Exercises: Machines for Strengthening   Cybex Knee Extension 15# 3 sets 10   Cybex Knee Flexion 25# 3 sets 10   Cybex Leg Press 50# 3x10   Knee/Hip Exercises: Standing   Forward Step Up Both;1 set;10 reps  BOSU, opp hip ext   Functional Squat 10 reps;2 sets  with weighted ball   SLS with Vectors 10# pulleys 10 times each leg hip flex,ext and abd   Walking with Sports Cord 30# 5 times fwd/back, 3 times each way  min A d/t LOB   Other Standing Knee Exercises donkey kick 10 reps each   Other Standing Knee Exercises hip flex,ext,abd no rest 10 each leg                  PT Short Term Goals - 05/21/15 1610    PT SHORT TERM GOAL #1   Title independent with HEP   Status Achieved   PT SHORT TERM GOAL #2   Title  walk with walker with step through gait   Status Achieved           PT Long Term Goals - 06/11/15 1458    PT LONG TERM GOAL #1   Title Patient will be able to independently SLR with LLE   Status Achieved   PT LONG TERM GOAL #2   Title Patient will report 50% subjective decrease in pain with 3-4/10 worst rating   Status On-going   PT LONG TERM GOAL #3   Title no pain with ADL's   Status On-going   PT LONG TERM GOAL #4   Title put on shoes and socks independently   Status On-going               Plan - 06/11/15 1457    Clinical Impression Statement pt with difficulty with SLS on Left to move RT leg, decreased stamina and strength   PT Next Visit Plan gait outside, gait without AD        Problem List Patient Active Problem List   Diagnosis Date Noted  . Gluten intolerance 04/27/2015  . Postoperative anemia due to acute blood loss 04/27/2015  . Closed hip fracture 04/22/2015  . Leg edema, left 04/22/2015  . Acute blood loss anemia 12/23/2014  . Fracture, intertrochanteric, left femur 12/18/2014  . Fall   .  Subtrochanteric fracture of femur 12/16/2014  . UTI (urinary tract infection) 12/16/2014  . Leukocytosis 12/16/2014  . Hip fracture requiring operative repair   . Closed left subtrochanteric femur fracture 12/15/2014  . Lesion of right humerus 12/15/2014  . Osteoporosis, unspecified 10/15/2013  . HTN (hypertension) 07/16/2013  . Hypothyroidism 07/16/2013  . Osteopenia 07/16/2013  . Environmental allergies 07/16/2013  . Obesity (BMI 30-39.9) 07/16/2013    Denvil Canning,ANGIE PTA 06/11/2015, 2:59 PM  [address] [place], [place], [address] Phone: [phone]   Fax:  [phone]

## 2015-06-15 ENCOUNTER — Ambulatory Visit: Payer: 59 | Admitting: Physical Therapy

## 2015-06-15 ENCOUNTER — Encounter: Payer: Self-pay | Admitting: Physical Therapy

## 2015-06-15 DIAGNOSIS — R262 Difficulty in walking, not elsewhere classified: Secondary | ICD-10-CM

## 2015-06-15 DIAGNOSIS — R269 Unspecified abnormalities of gait and mobility: Secondary | ICD-10-CM

## 2015-06-15 DIAGNOSIS — R29898 Other symptoms and signs involving the musculoskeletal system: Secondary | ICD-10-CM

## 2015-06-15 DIAGNOSIS — M25552 Pain in left hip: Secondary | ICD-10-CM | POA: Diagnosis not present

## 2015-06-15 NOTE — Therapy (Signed)
[address] [place], [place], [address] Phone: [phone]   Fax:  [phone]  Physical Therapy Treatment  Patient Details  Name: [name] MRN: [id number] Date of Birth: 1951-02-03 Referring Provider:  Crist Infante, MD  Encounter Date: 06/15/2015      PT End of Session - 06/15/15 1800    Visit Number 8   PT Start Time 3662   PT Stop Time 1616   PT Time Calculation (min) 46 min   Activity Tolerance Patient tolerated treatment well      Past Medical History  Diagnosis Date  . Chickenpox   . Genital warts   . Environmental allergies   . HTN (hypertension)   . Hyperlipidemia   . Thyroid disease   . Thyroid nodule   . Hypothyroidism   . Complication of anesthesia     "hard to wake up:  Marland Kitchen Family history of adverse reaction to anesthesia     "sister & mom are hard to wake up:  . Heart murmur   . 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 Carpal tunnel release Bilateral 1990's  . Cystectomy      From the cervix  . Intramedullary (im) nail intertrochanteric Left 12/15/2014    Procedure: INTRAMEDULLARY (IM) NAIL INTERTROCHANTRIC;  Surgeon: Augustin Schooling, MD;  Location: [facility];  Service: Orthopedics;  Laterality: Left;  . Fracture surgery    . Total hip arthroplasty Left 04/24/2015    Procedure:  TOTAL HIP ARTHROPLASTY AND REMOVAL OF IM AFFIXUS NAIL ;  Surgeon: Paralee Cancel, MD;  Location: [facility];  Service: Orthopedics;  Laterality: Left;    There were no vitals filed for this visit.  Visit Diagnosis:  Difficulty walking  Weakness of left hip  Abnormality of gait      Subjective Assessment - 06/15/15 1758    Subjective Not bad, still fatigue easily per patient   Pain Score 2    Pain Location Hip   Pain Orientation Left   Aggravating Factors  being up on it   Pain Relieving Factors rest                          [facility] Adult PT Treatment/Exercise - 06/15/15 0001    Ambulation/Gait   Gait Comments 180 feet without device, cues for speed and step length   Knee/Hip Exercises: Aerobic   Elliptical Elliptical 4 min I 3 R 3   Tread Mill Nustep L 5 6 min   Knee/Hip Exercises: Machines for Strengthening   Cybex Knee Extension 15# 3 sets 10   Cybex Knee Flexion 25# 3 sets 10   Cybex Leg Press 50# 3x10   Knee/Hip Exercises: Standing   Forward Step Up Both;1 set;10 reps   Functional Squat 10 reps;2 sets   Other Standing Knee Exercises balanc activities with ball catch and side step, ball toss on airex   Other Standing Knee Exercises hip flex,ext,abd no rest 10 each leg                  PT Short Term Goals - 05/21/15 1610    PT SHORT TERM GOAL #1   Title independent with HEP   Status Achieved   PT SHORT TERM GOAL #2   Title walk with walker with step through gait   Status Achieved  PT Long Term Goals - 06/11/15 1458    PT LONG TERM GOAL #1   Title Patient will be able to independently SLR with LLE   Status Achieved   PT LONG TERM GOAL #2   Title Patient will report 50% subjective decrease in pain with 3-4/10 worst rating   Status On-going   PT LONG TERM GOAL #3   Title no pain with ADL's   Status On-going   PT LONG TERM GOAL #4   Title put on shoes and socks independently   Status On-going               Plan - 06/15/15 1800    Clinical Impression Statement Pat[place] needing continued work on strength and gait.  Still using a SPC, and with poor gait at times   PT Next Visit Plan gait outside, gait without AD   Consulted and Agree with Plan of Care Patient        Problem List Patient Active Problem List   Diagnosis Date Noted  . Gluten intolerance 04/27/2015  . Postoperative anemia due to acute blood loss 04/27/2015  . Closed hip fracture 04/22/2015  . Leg edema, left 04/22/2015  . Acute blood loss anemia  12/23/2014  . Fracture, intertrochanteric, left femur 12/18/2014  . Fall   . Subtrochanteric fracture of femur 12/16/2014  . UTI (urinary tract infection) 12/16/2014  . Leukocytosis 12/16/2014  . Hip fracture requiring operative repair   . Closed left subtrochanteric femur fracture 12/15/2014  . Lesion of right humerus 12/15/2014  . Osteoporosis, unspecified 10/15/2013  . HTN (hypertension) 07/16/2013  . Hypothyroidism 07/16/2013  . Osteopenia 07/16/2013  . Environmental allergies 07/16/2013  . Obesity (BMI 30-39.9) 07/16/2013    Sumner Boast., PT 06/15/2015, 6:02 PM  [address] [place], [place], [address] Phone: [phone]   Fax:  [phone]

## 2015-06-18 ENCOUNTER — Encounter: Payer: Self-pay | Admitting: Physical Therapy

## 2015-06-18 ENCOUNTER — Ambulatory Visit: Payer: 59 | Admitting: Physical Therapy

## 2015-06-18 DIAGNOSIS — R269 Unspecified abnormalities of gait and mobility: Secondary | ICD-10-CM

## 2015-06-18 DIAGNOSIS — M25552 Pain in left hip: Secondary | ICD-10-CM | POA: Diagnosis not present

## 2015-06-18 DIAGNOSIS — R262 Difficulty in walking, not elsewhere classified: Secondary | ICD-10-CM

## 2015-06-18 DIAGNOSIS — R29898 Other symptoms and signs involving the musculoskeletal system: Secondary | ICD-10-CM

## 2015-06-18 NOTE — Therapy (Signed)
[address] [place], [place], [address] Phone: [phone]   Fax:  [phone]  Physical Therapy Treatment  Patient Details  Name: [name] MRN: [id number] Date of Birth: 16-May-1951 Referring Provider:  Paralee Cancel, MD  Encounter Date: 06/18/2015      PT End of Session - 06/18/15 1640    Visit Number 9   Date for PT Re-Evaluation 07/08/15   PT Start Time 1527   PT Stop Time 1630   PT Time Calculation (min) 63 min   Behavior During Therapy [facility] for tasks assessed/performed      Past Medical History  Diagnosis Date  . Chickenpox   . Genital warts   . Environmental allergies   . HTN (hypertension)   . Hyperlipidemia   . Thyroid disease   . Thyroid nodule   . Hypothyroidism   . Complication of anesthesia     "hard to wake up:  Marland Kitchen Family history of adverse reaction to anesthesia     "sister & mom are hard to wake up:  . Heart murmur   . 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 Carpal tunnel release Bilateral 1990's  . Cystectomy      From the cervix  . Intramedullary (im) nail intertrochanteric Left 12/15/2014    Procedure: INTRAMEDULLARY (IM) NAIL INTERTROCHANTRIC;  Surgeon: Augustin Schooling, MD;  Location[facility];  Service: Orthopedics;  Laterality: Left;  . Fracture surgery    . Total hip arthroplasty Left 04/24/2015    Procedure:  TOTAL HIP ARTHROPLASTY AND REMOVAL OF IM AFFIXUS NAIL ;  Surgeon: Paralee Cancel, MD;  Location: [facility];  Service: Orthopedics;  Laterality: Left;    There were no vitals filed for this visit.  Visit Diagnosis:  Difficulty walking  Weakness of left hip  Abnormality of gait      Subjective Assessment - 06/18/15 1538    Subjective Not sore, would like to try steps today   Currently in Pain? No/denies                         [facility] Adult PT Treatment/Exercise  - 06/18/15 0001    Ambulation/Gait   Gait Comments stairs reciprocally, gait with light HHA working on speed, posture   Knee/Hip Exercises: Aerobic   Elliptical Elliptical 4 min I 3 R 3   Tread Mill Nustep L 5 6 min   Knee/Hip Exercises: Machines for Strengthening   Cybex Knee Extension 15# 3 sets 10   Cybex Knee Flexion 25# 3 sets 10   Knee/Hip Exercises: Seated   Other Seated Knee/Hip Exercises passive HS and piriformis stretch, 5# hip abduction and extension   Other Seated Knee/Hip Exercises red tband left ankle exercises   Knee/Hip Exercises: Supine   Other Supine Knee/Hip Exercises minitramp x 3 minutes, side stepping and back ward walking, on airex head turns and narrow BOS                  PT Short Term Goals - 05/21/15 1610    PT SHORT TERM GOAL #1   Title independent with HEP   Status Achieved   PT SHORT TERM GOAL #2   Title walk with walker with step through gait   Status Achieved           PT Long Term Goals -  06/18/15 1652    PT LONG TERM GOAL #2   Title Patient will report 50% subjective decrease in pain with 3-4/10 worst rating   Status Partially Met               Plan - 06/18/15 1640    Clinical Impression Statement Did very well on stairs today, still with slight IR of the hip and some flexion in the trunk with gait   PT Next Visit Plan continue with ROM, gait, function   Consulted and Agree with Plan of Care Patient        Problem List Patient Active Problem List   Diagnosis Date Noted  . Gluten intolerance 04/27/2015  . Postoperative anemia due to acute blood loss 04/27/2015  . Closed hip fracture 04/22/2015  . Leg edema, left 04/22/2015  . Acute blood loss anemia 12/23/2014  . Fracture, intertrochanteric, left femur 12/18/2014  . Fall   . Subtrochanteric fracture of femur 12/16/2014  . UTI (urinary tract infection) 12/16/2014  . Leukocytosis 12/16/2014  . Hip fracture requiring operative repair   . Closed left  subtrochanteric femur fracture 12/15/2014  . Lesion of right humerus 12/15/2014  . Osteoporosis, unspecified 10/15/2013  . HTN (hypertension) 07/16/2013  . Hypothyroidism 07/16/2013  . Osteopenia 07/16/2013  . Environmental allergies 07/16/2013  . Obesity (BMI 30-39.9) 07/16/2013    Sumner Boast, PT 06/18/2015, 4:53 PM  [address] [place], [place], [address] Phone: [phone]   Fax:  [phone]

## 2015-06-22 ENCOUNTER — Encounter: Payer: Self-pay | Admitting: Physical Therapy

## 2015-06-22 ENCOUNTER — Ambulatory Visit: Payer: 59 | Admitting: Physical Therapy

## 2015-06-22 DIAGNOSIS — R29898 Other symptoms and signs involving the musculoskeletal system: Secondary | ICD-10-CM

## 2015-06-22 DIAGNOSIS — M25552 Pain in left hip: Secondary | ICD-10-CM | POA: Diagnosis not present

## 2015-06-22 DIAGNOSIS — R262 Difficulty in walking, not elsewhere classified: Secondary | ICD-10-CM

## 2015-06-22 DIAGNOSIS — R269 Unspecified abnormalities of gait and mobility: Secondary | ICD-10-CM

## 2015-06-22 NOTE — Therapy (Signed)
[address] [place], [place], [address] Phone: [phone]   Fax:  [phone]  Physical Therapy Treatment  Patient Details  Name: [name] MRN: [id number] Date of Birth: 01-Oct-1951 Referring Provider:  Paralee Cancel, MD  Encounter Date: 06/22/2015      PT End of Session - 06/22/15 1701    Visit Number 10   Date for PT Re-Evaluation 07/08/15   PT Start Time 5277   PT Stop Time 8242   PT Time Calculation (min) 63 min   Activity Tolerance Patient tolerated treatment well      Past Medical History  Diagnosis Date  . Chickenpox   . Genital warts   . Environmental allergies   . HTN (hypertension)   . Hyperlipidemia   . Thyroid disease   . Thyroid nodule   . Hypothyroidism   . Complication of anesthesia     "hard to wake up:  Marland Kitchen Family history of adverse reaction to anesthesia     "sister & mom are hard to wake up:  . Heart murmur   . 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 Carpal tunnel release Bilateral 1990's  . Cystectomy      From the cervix  . Intramedullary (im) nail intertrochanteric Left 12/15/2014    Procedure: INTRAMEDULLARY (IM) NAIL INTERTROCHANTRIC;  Surgeon: Augustin Schooling, MD;  Location: [facility];  Service: Orthopedics;  Laterality: Left;  . Fracture surgery    . 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  Visit Diagnosis:  Difficulty walking  Weakness of left hip  Abnormality of gait      Subjective Assessment - 06/22/15 1556    Subjective some pain in the back this weekend using the cane, walking without cane today   Currently in Pain? Yes   Pain Score 1    Pain Location Back   Aggravating Factors  using cane   Pain Relieving Factors not using cane                         [facility] Adult PT Treatment/Exercise - 06/22/15 0001    Ambulation/Gait   Gait Comments stairs reciprocally, gait with light HHA working on speed, posture, walked outside   Knee/Hip Exercises: Aerobic   Elliptical Elliptical 4 min I 3 R 3   Tread Mill Nustep L 5 6 min   Knee/Hip Exercises: Machines for Strengthening   Cybex Knee Extension 10# 2x10, 5# eccentrics with left only   Cybex Knee Flexion 25# 3 sets 10   Cybex Leg Press 40# 2x10, eccentrics 20# single leg, single leg without weight   Knee/Hip Exercises: Standing   Other Standing Knee Exercises balanc activities with ball catch and side step, ball toss on airex   Other Standing Knee Exercises hip flex,ext,abd no rest 10 each leg   Knee/Hip Exercises: Supine   Other Supine Knee/Hip Exercises minitramp x 3 minutes, side stepping and back ward walking, on airex head turns and narrow BOS                  PT Short Term Goals - 05/21/15 1610  PT SHORT TERM GOAL #1   Title independent with HEP   Status Achieved   PT SHORT TERM GOAL #2   Title walk with walker with step through gait   Status Achieved           PT Long Term Goals - July 06, 2015 1705    PT LONG TERM GOAL #3   Title no pain with ADL's   Status Partially Met               Plan - Jul 06, 2015 1704    Clinical Impression Statement Walking without cane today.  Still requires cues for posture and arm swing, used mirror to assist with this for visual cues   PT Next Visit Plan continue with ROM, gait, function   Consulted and Agree with Plan of Care Patient          G-Codes - 07/06/15 1705    Functional Assessment Tool Used FOTO   Functional Limitation Mobility: Walking and moving around   Mobility: Walking and Moving Around Current Status [phone]) At least 40 percent but less than 60 percent impaired, limited or restricted   Mobility:  Walking and Moving Around Goal Status [phone]) At least 40 percent but less than 60 percent impaired, limited or restricted      Problem List Patient Active Problem List   Diagnosis Date Noted  . Gluten intolerance 04/27/2015  . Postoperative anemia due to acute blood loss 04/27/2015  . Closed hip fracture 04/22/2015  . Leg edema, left 04/22/2015  . Acute blood loss anemia 12/23/2014  . Fracture, intertrochanteric, left femur 12/18/2014  . Fall   . Subtrochanteric fracture of femur 12/16/2014  . UTI (urinary tract infection) 12/16/2014  . Leukocytosis 12/16/2014  . Hip fracture requiring operative repair   . Closed left subtrochanteric femur fracture 12/15/2014  . Lesion of right humerus 12/15/2014  . Osteoporosis, unspecified 10/15/2013  . HTN (hypertension) 07/16/2013  . Hypothyroidism 07/16/2013  . Osteopenia 07/16/2013  . Environmental allergies 07/16/2013  . Obesity (BMI 30-39.9) 07/16/2013    Sumner Boast., PT 07/06/2015, 5:11 PM  [address] [place], [place], [address] Phone: [phone]   Fax:  [phone]

## 2015-06-24 ENCOUNTER — Ambulatory Visit: Payer: 59 | Admitting: Physical Therapy

## 2015-06-24 ENCOUNTER — Encounter: Payer: Self-pay | Admitting: Physical Therapy

## 2015-06-24 DIAGNOSIS — R29898 Other symptoms and signs involving the musculoskeletal system: Secondary | ICD-10-CM

## 2015-06-24 DIAGNOSIS — M25552 Pain in left hip: Secondary | ICD-10-CM | POA: Diagnosis not present

## 2015-06-24 DIAGNOSIS — R262 Difficulty in walking, not elsewhere classified: Secondary | ICD-10-CM

## 2015-06-24 DIAGNOSIS — M6289 Other specified disorders of muscle: Secondary | ICD-10-CM

## 2015-06-24 DIAGNOSIS — R269 Unspecified abnormalities of gait and mobility: Secondary | ICD-10-CM

## 2015-06-24 IMAGING — CT CT L SPINE W/O CM
3 of 5 series · 14 of 33 positions shown, 16 images · non-contrast
Comparison: None.

CLINICAL DATA: Low back and left hip pain. Lumbar compression
fracture. Initial encounter.

EXAM:
CT LUMBAR SPINE WITHOUT CONTRAST
TECHNIQUE: Multidetector CT imaging of the lumbar spine was performed without
intravenous contrast administration. Multiplanar CT image
reconstructions were also generated.

[Series 4: l spine 2.0 i30s 3 · axial · 0.25mm/px · z∈[-498,-312]mm · 8 of 92 slices shown, 10 images]
[im 8/92  soft-tissue]
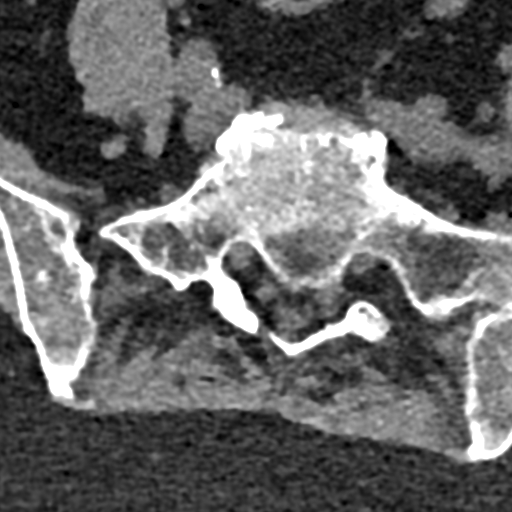
[im 8/92  bone]
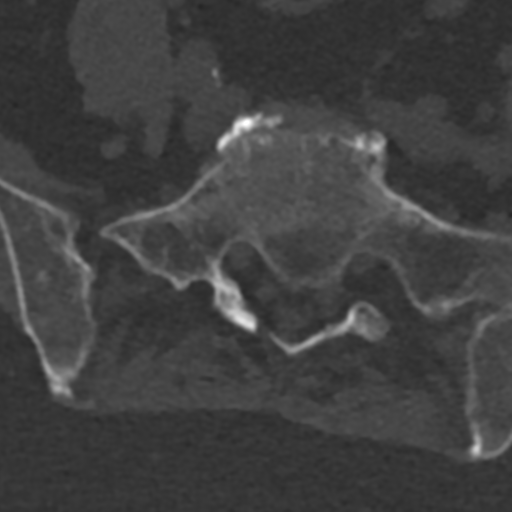
[im 22/92  bone]
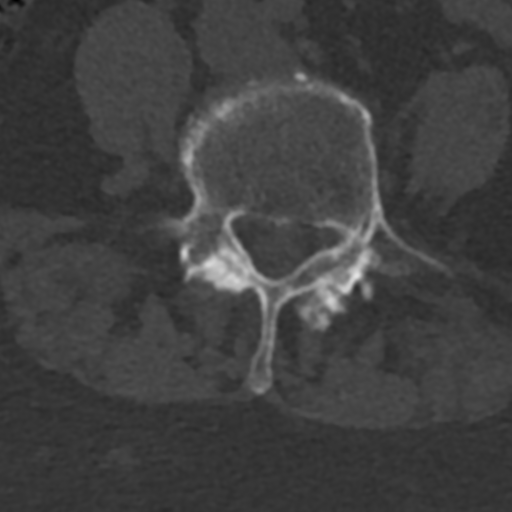
[im 29/92  bone]
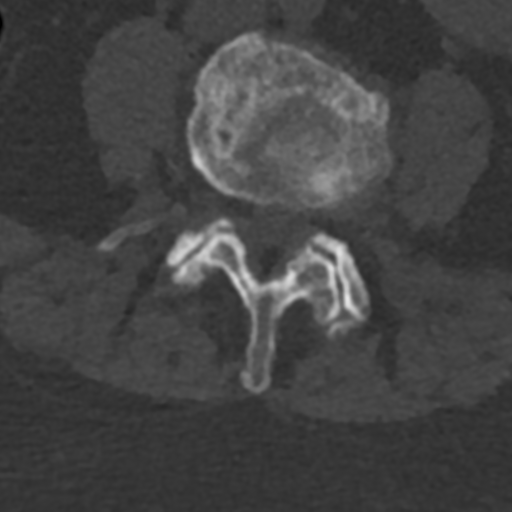
[im 43/92  bone]
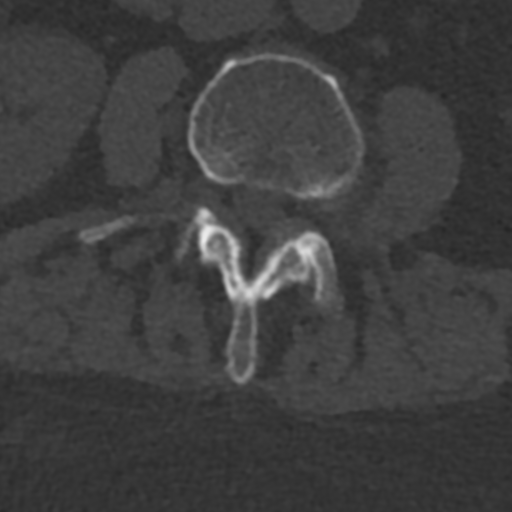
[im 50/92  soft-tissue]
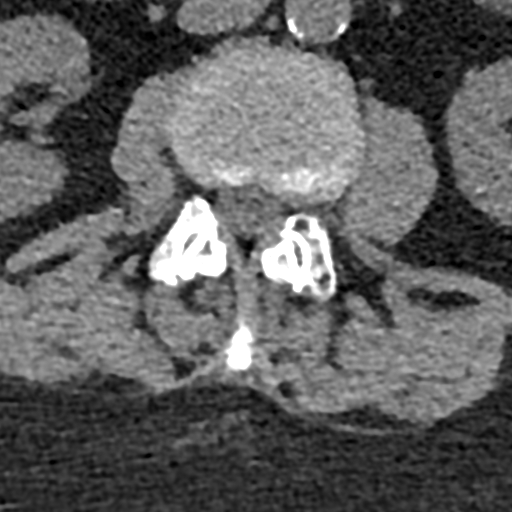
[im 50/92  bone]
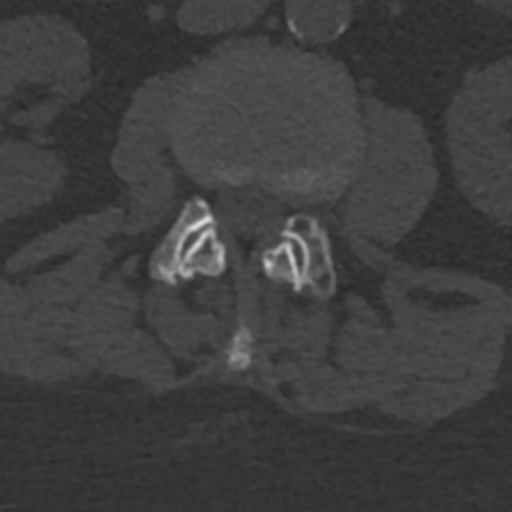
[im 64/92  bone]
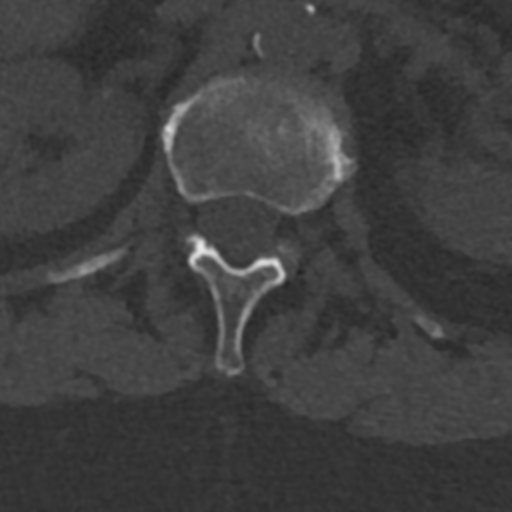
[im 71/92  bone]
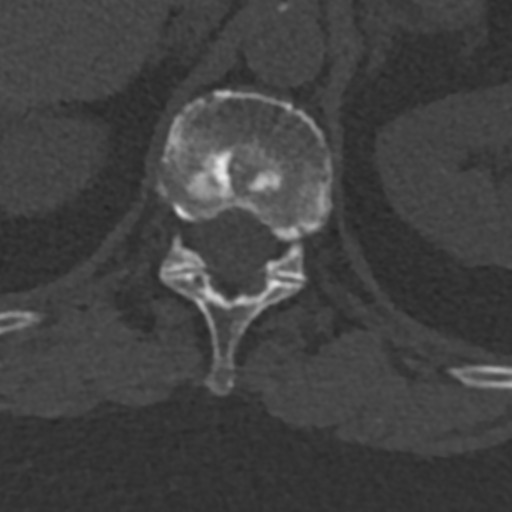
[im 85/92  bone]
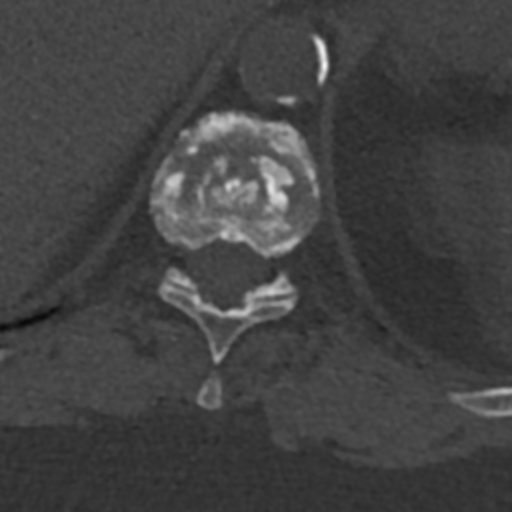

[Series 5: coronal bone · coronal · 0.36mm/px · 1 of 61 slices shown]
[im 31/61  bone]
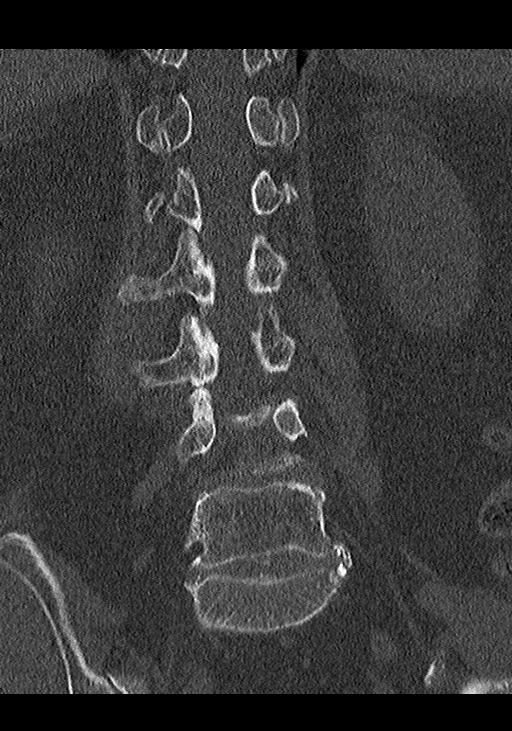

[Series 6: sagittal bone · sagittal · 0.36mm/px · 5 of 61 slices shown]
[im 11/61  bone]
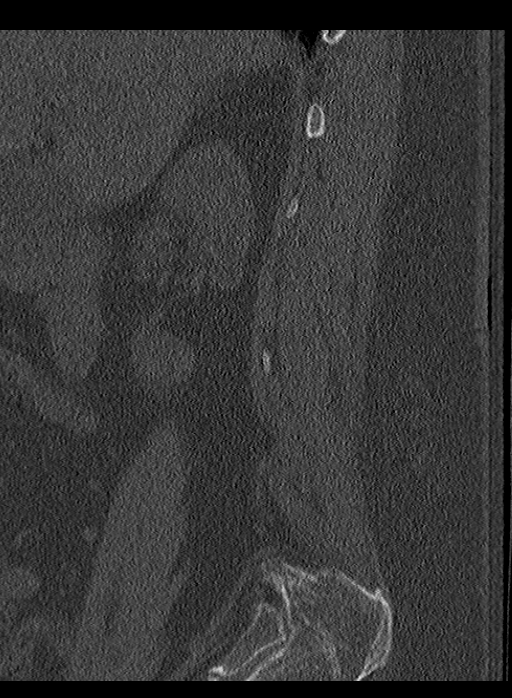
[im 21/61  bone]
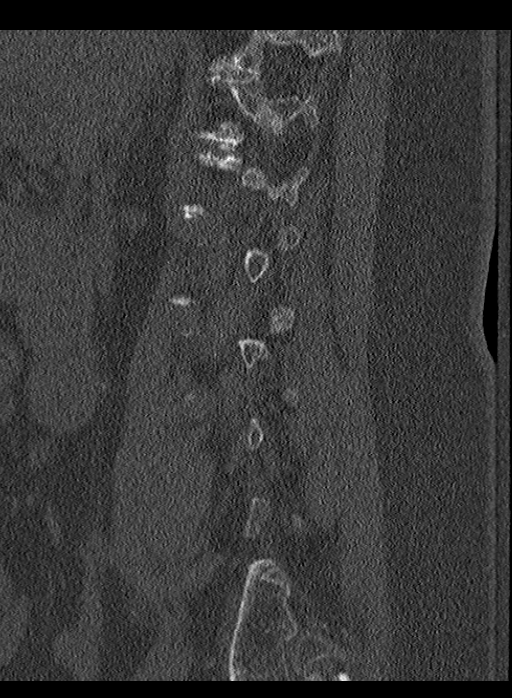
[im 31/61  bone]
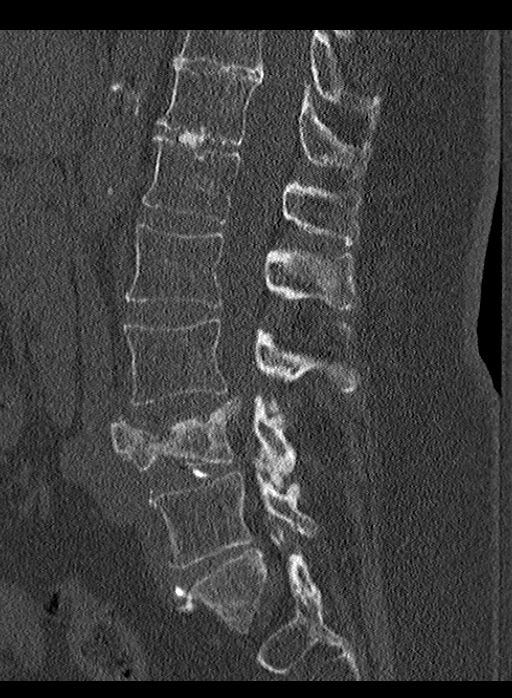
[im 41/61  bone]
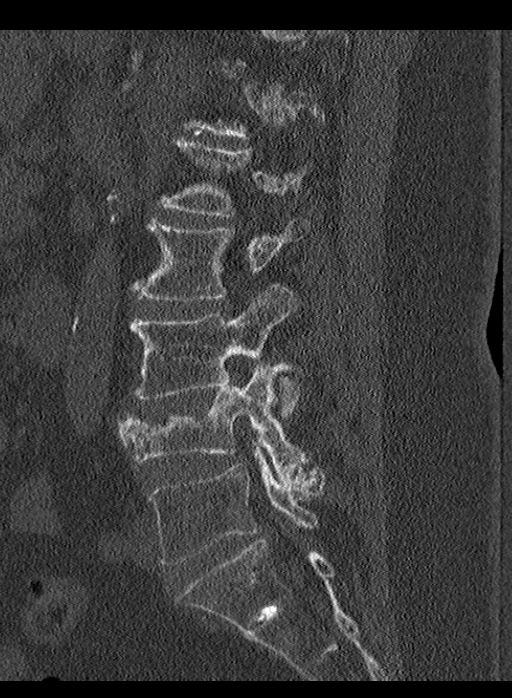
[im 51/61  bone]
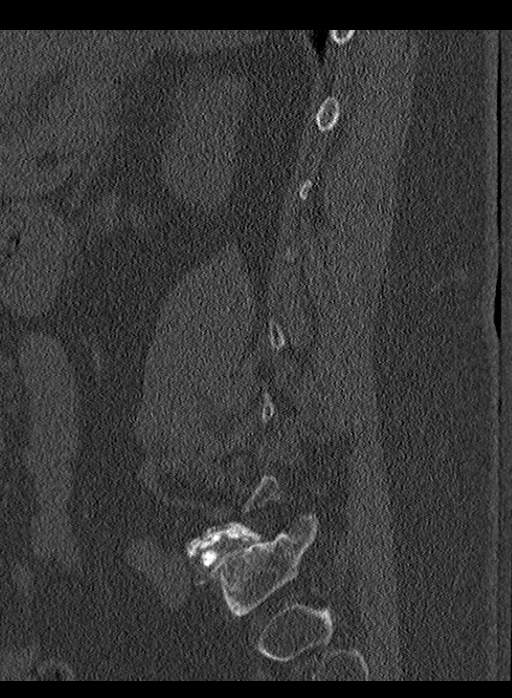

[14 of 33 positions shown; findings below may reference images not displayed]

FINDINGS: The patient has a compression fracture of L4 with vertebral body
height loss of up to 95%. There is bony retropulsion off the
superior endplate as described below. The fracture is of
indeterminate chronicity. No extension into the posterior elements
is identified. No other fracture is seen. Vertebral body alignment
is maintained. Imaged paraspinous structures demonstrate scattered
aortic atherosclerosis without aneurysm.

T11-12: The disc is calcified. No bulge or protrusion. The central
canal and foramina appear open.

T12-L1: The disc is calcified. No bulge or protrusion. The central
canal and foramina appear open.

L1-2:  Negative.

L2-3: There is some facet degenerative disease. No disc bulge or
protrusion. The central canal and foramina are open.

L3-4: Bony retropulsion off the superior endplate of L4 results in
moderately severe central canal stenosis. Moderate bilateral
foraminal narrowing is also seen.

L4-5: Advanced facet degenerative disease is seen. There is a
shallow disc bulge and some ligamentum flavum thickening. Moderate
to moderately severe central canal stenosis is present. Left worse
than right foraminal narrowing is identified.

L5-S1: Shallow disc bulge and endplate spur without central canal
narrowing. Moderately severe to severe foraminal narrowing appears
worse on the left.
IMPRESSION: Age-indeterminate L4 compression fracture with up to 95 % vertebral
body height loss. Bony retropulsion off the superior endplate of L4
results in moderately severe central canal stenosis at L3-4.

Moderate to moderately severe central canal stenosis L4-5 where
there is left worse than right foraminal narrowing.

Moderately severe to severe bilateral foraminal narrowing L5-S1 is
worse on the left.

## 2015-06-24 NOTE — Therapy (Signed)
[address] [place], [place], [address] Phone: [phone]   Fax:  [phone]  Physical Therapy Treatment  Patient Details  Name: [name] MRN: [id number] Date of Birth: [birth year] Referring Provider:  Paralee Cancel, MD  Encounter Date: 06/24/2015      PT End of Session - 06/24/15 1654    Visit Number 11   Date for PT Re-Evaluation 07/08/15   PT Start Time 1529   PT Stop Time 1630   PT Time Calculation (min) 61 min   Activity Tolerance Patient tolerated treatment well      Past Medical History  Diagnosis Date  . Chickenpox   . Genital warts   . Environmental allergies   . HTN (hypertension)   . Hyperlipidemia   . Thyroid disease   . Thyroid nodule   . Hypothyroidism   . Complication of anesthesia     "hard to wake up:  Marland Kitchen Family history of adverse reaction to anesthesia     "sister & mom are hard to wake up:  . Heart murmur   . 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 Carpal tunnel release Bilateral 1990's  . Cystectomy      From the cervix  . Intramedullary (im) nail intertrochanteric Left 12/15/2014    Procedure: INTRAMEDULLARY (IM) NAIL INTERTROCHANTRIC;  Surgeon: Augustin Schooling, MD;  Location: [facility];  Service: Orthopedics;  Laterality: Left;  . Fracture surgery    . Total hip arthroplasty Left 04/24/2015    Procedure:  TOTAL HIP ARTHROPLASTY AND REMOVAL OF IM AFFIXUS NAIL ;  Surgeon: Paralee Cancel, MD;  Location: [facility];  Service: Orthopedics;  Laterality: Left;    There were no vitals filed for this visit.  Visit Diagnosis:  Difficulty walking  Weakness of left hip  Abnormality of gait  Leg fascia tightness      Subjective Assessment - 06/24/15 1644    Subjective Some pain again, but I changed chairs, that is why I think my back is hurting.   Currently in Pain? Yes   Pain Score 2    Pain  Location Back   Pain Orientation Left   Pain Descriptors / Indicators Aching                         [facility] Adult PT Treatment/Exercise - 06/24/15 0001    Ambulation/Gait   Gait Comments stairs reciprocally, gait with light HHA working on speed, posture, walked outside, worked with patient a lot today with arm swing, step length and posture while walking, also speed and resisted gait, side stepping, high knee marching and backward gait   Knee/Hip Exercises: Public affairs consultant 30 seconds;3 reps   Hip Flexor Stretch 30 seconds;5 reps   Piriformis Stretch 2 reps;30 seconds   Piriformis Stretch Limitations also performed adductor stretch passively   Knee/Hip Exercises: Aerobic   Elliptical Elliptical 4 min I 3 R 3                  PT Short Term Goals - 05/21/15 1610    PT SHORT TERM GOAL #1   Title independent with HEP   Status Achieved   PT SHORT TERM GOAL #2   Title walk with walker with step through gait   Status Achieved  PT Long Term Goals - 06/24/15 1656    PT LONG TERM GOAL #4   Title put on shoes and socks independently   Status Partially Met               Plan - 06/24/15 1655    Clinical Impression Statement Need cues and assist to get arm swing.  has very tight hip flexor and quad.     PT Next Visit Plan continue with ROM, gait, function        Problem List Patient Active Problem List   Diagnosis Date Noted  . Gluten intolerance 04/27/2015  . Postoperative anemia due to acute blood loss 04/27/2015  . Closed hip fracture 04/22/2015  . Leg edema, left 04/22/2015  . Acute blood loss anemia 12/23/2014  . Fracture, intertrochanteric, left femur 12/18/2014  . Fall   . Subtrochanteric fracture of femur 12/16/2014  . UTI (urinary tract infection) 12/16/2014  . Leukocytosis 12/16/2014  . Hip fracture requiring operative repair   . Closed left subtrochanteric femur fracture 12/15/2014  . Lesion of right humerus  12/15/2014  . Osteoporosis, unspecified 10/15/2013  . HTN (hypertension) 07/16/2013  . Hypothyroidism 07/16/2013  . Osteopenia 07/16/2013  . Environmental allergies 07/16/2013  . Obesity (BMI 30-39.9) 07/16/2013    Sumner Boast., PT 06/24/2015, 4:57 PM  [address] [place], [place], [address] Phone: [phone]   Fax:  [phone]

## 2015-06-30 ENCOUNTER — Ambulatory Visit: Payer: 59 | Attending: Orthopedic Surgery | Admitting: Physical Therapy

## 2015-06-30 DIAGNOSIS — R29898 Other symptoms and signs involving the musculoskeletal system: Secondary | ICD-10-CM | POA: Insufficient documentation

## 2015-06-30 DIAGNOSIS — M25552 Pain in left hip: Secondary | ICD-10-CM | POA: Insufficient documentation

## 2015-06-30 DIAGNOSIS — R269 Unspecified abnormalities of gait and mobility: Secondary | ICD-10-CM

## 2015-06-30 DIAGNOSIS — M629 Disorder of muscle, unspecified: Secondary | ICD-10-CM | POA: Diagnosis present

## 2015-06-30 DIAGNOSIS — M6289 Other specified disorders of muscle: Secondary | ICD-10-CM

## 2015-06-30 DIAGNOSIS — R262 Difficulty in walking, not elsewhere classified: Secondary | ICD-10-CM | POA: Diagnosis not present

## 2015-06-30 NOTE — Therapy (Signed)
[address] [place], [place], [address] Phone: [phone]   Fax:  [phone]  Physical Therapy Treatment  Patient Details  Name: [name] MRN: [id number] Date of Birth: [birth year] Referring Provider:  Paralee Cancel, MD  Encounter Date: 06/30/2015      PT End of Session - 06/30/15 1609    Visit Number 12   Number of Visits 12   Date for PT Re-Evaluation 07/08/15   Authorization Type [employer]   Authorization Time Period 3 visit/yr   PT Start Time 1530   PT Stop Time 1610   PT Time Calculation (min) 40 min   Activity Tolerance Patient tolerated treatment well   Behavior During Therapy [facility] for tasks assessed/performed      Past Medical History  Diagnosis Date  . Chickenpox   . Genital warts   . Environmental allergies   . HTN (hypertension)   . Hyperlipidemia   . Thyroid disease   . Thyroid nodule   . Hypothyroidism   . Complication of anesthesia     "hard to wake up:  Marland Kitchen Family history of adverse reaction to anesthesia     "sister & mom are hard to wake up:  . Heart murmur   . 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 Carpal tunnel release Bilateral 1990's  . Cystectomy      From the cervix  . Intramedullary (im) nail intertrochanteric Left 12/15/2014    Procedure: INTRAMEDULLARY (IM) NAIL INTERTROCHANTRIC;  Surgeon: Augustin Schooling, MD;  Location: [facility];  Service: Orthopedics;  Laterality: Left;  . Fracture surgery    . Total hip arthroplasty Left 04/24/2015    Procedure:  TOTAL HIP ARTHROPLASTY AND REMOVAL OF IM AFFIXUS NAIL ;  Surgeon: Paralee Cancel, MD;  Location: [facility];  Service: Orthopedics;  Laterality: Left;    There were no vitals filed for this visit.  Visit Diagnosis:  Difficulty walking  Weakness of left hip  Abnormality of gait  Leg fascia tightness  Left hip pain  Pain in hip joint,  left      Subjective Assessment - 06/30/15 1531    Subjective Noticed she's having trouble getting up from lower toilet seats.   Patient Stated Goals walk without pain and be normal, I was walking daily for exercise   Currently in Pain? Yes   Pain Score 3    Pain Location Hip   Pain Orientation Left   Pain Descriptors / Indicators Aching   Pain Type Acute pain;Surgical pain   Pain Onset More than a month ago   Pain Frequency Intermittent   Aggravating Factors  lying on left side   Pain Relieving Factors rest and repositioning            [facility] PT Assessment - 06/30/15 1547    Strength   Strength Assessment Site Knee   Left Hip Flexion 3+/5   Left Hip Extension 3/5   Left Hip ABduction 3/5   Left Hip ADduction 3/5   Right/Left Knee Left   Left Knee Flexion 3+/5   Left Knee Extension 5/5                     [facility] Adult PT Treatment/Exercise - 06/30/15 1532    Knee/Hip Exercises: Aerobic   Stationary Bike bike 6   Tread Mill Nustep L 5 8 min  Knee/Hip Exercises: Standing   Forward Step Up Both;10 reps;Hand Hold: 1;Step Height: 6";2 sets   Knee/Hip Exercises: Seated   Other Seated Knee/Hip Exercises sit to stand x 15 without UE support; added 2 in block under feet to simulate lower seat pt needing UE support x 15 reps   Knee/Hip Exercises: Supine   Bridges Strengthening;2 sets;15 reps                  PT Short Term Goals - 05/21/15 1610    PT SHORT TERM GOAL #1   Title independent with HEP   Status Achieved   PT SHORT TERM GOAL #2   Title walk with walker with step through gait   Status Achieved           PT Long Term Goals - 06/24/15 1656    PT LONG TERM GOAL #4   Title put on shoes and socks independently   Status Partially Met               Plan - 06/30/15 1610    Clinical Impression Statement Pt continues to demonstrate LLE weakness affecting functional mobility.  Will continue to benefit from PT to maximize function  and decrease risk of falls.   PT Next Visit Plan continue with ROM, gait, function   Consulted and Agree with Plan of Care Patient        Problem List Patient Active Problem List   Diagnosis Date Noted  . Gluten intolerance 04/27/2015  . Postoperative anemia due to acute blood loss 04/27/2015  . Closed hip fracture 04/22/2015  . Leg edema, left 04/22/2015  . Acute blood loss anemia 12/23/2014  . Fracture, intertrochanteric, left femur 12/18/2014  . Fall   . Subtrochanteric fracture of femur 12/16/2014  . UTI (urinary tract infection) 12/16/2014  . Leukocytosis 12/16/2014  . Hip fracture requiring operative repair   . Closed left subtrochanteric femur fracture 12/15/2014  . Lesion of right humerus 12/15/2014  . Osteoporosis, unspecified 10/15/2013  . HTN (hypertension) 07/16/2013  . Hypothyroidism 07/16/2013  . Osteopenia 07/16/2013  . Environmental allergies 07/16/2013  . Obesity (BMI 30-39.9) 07/16/2013   Laureen Abrahams, PT, DPT 06/30/2015 4:12 PM  [address] [place], [place], [address] Phone: [phone]   Fax:  [phone]

## 2015-07-02 ENCOUNTER — Ambulatory Visit: Payer: 59 | Admitting: Physical Therapy

## 2015-07-07 ENCOUNTER — Encounter: Payer: Self-pay | Admitting: Physical Therapy

## 2015-07-07 ENCOUNTER — Ambulatory Visit: Payer: 59 | Admitting: Physical Therapy

## 2015-07-07 DIAGNOSIS — R269 Unspecified abnormalities of gait and mobility: Secondary | ICD-10-CM

## 2015-07-07 DIAGNOSIS — R262 Difficulty in walking, not elsewhere classified: Secondary | ICD-10-CM

## 2015-07-07 DIAGNOSIS — M6289 Other specified disorders of muscle: Secondary | ICD-10-CM

## 2015-07-07 NOTE — Therapy (Signed)
[address] [place], [place], [address] Phone: [phone]   Fax:  [phone]  Physical Therapy Treatment  Patient Details  Name: [name] MRN: [id number] Date of Birth: [birth year] Referring Provider:  Paralee Cancel, MD  Encounter Date: 07/07/2015      PT End of Session - 07/07/15 1639    Visit Number 13   Number of Visits 20   Date for PT Re-Evaluation 08/07/15   PT Start Time 1528   PT Stop Time 1633   PT Time Calculation (min) 65 min   Activity Tolerance Patient tolerated treatment well   Behavior During Therapy [facility] for tasks assessed/performed      Past Medical History  Diagnosis Date  . Chickenpox   . Genital warts   . Environmental allergies   . HTN (hypertension)   . Hyperlipidemia   . Thyroid disease   . Thyroid nodule   . Hypothyroidism   . Complication of anesthesia     "hard to wake up:  Marland Kitchen Family history of adverse reaction to anesthesia     "sister & mom are hard to wake up:  . Heart murmur   . 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 Carpal tunnel release Bilateral 1990's  . Cystectomy      From the cervix  . Intramedullary (im) nail intertrochanteric Left 12/15/2014    Procedure: INTRAMEDULLARY (IM) NAIL INTERTROCHANTRIC;  Surgeon: Augustin Schooling, MD;  Location: [facility];  Service: Orthopedics;  Laterality: Left;  . Fracture surgery    . Total hip arthroplasty Left 04/24/2015    Procedure:  TOTAL HIP ARTHROPLASTY AND REMOVAL OF IM AFFIXUS NAIL ;  Surgeon: Paralee Cancel, MD;  Location: [facility];  Service: Orthopedics;  Laterality: Left;    There were no vitals filed for this visit.  Visit Diagnosis:  Abnormality of gait  Leg fascia tightness  Difficulty walking      Subjective Assessment - 07/07/15 1531    Subjective Pt states she is doing well and she'll see the doctor next week on wed.   Has has success getting up from the toilet seat by pressing in the middle as stephanie suggested.     Pain Score 0-No pain                         [facility] Adult PT Treatment/Exercise - 07/07/15 0001    Exercises   Other Exercises  resisted gait in hallway forward/backward/side stepping 1 x forward and back to end of hallway  standing side step over green half foam roll 2x10 over/back   Knee/Hip Exercises: Stretches   Sports administrator Left;3 reps;30 seconds  sidelying   Hip Flexor Stretch 3 reps;30 seconds  sidelying   Piriformis Stretch 3 reps;30 seconds  sitting   Knee/Hip Exercises: Aerobic   Elliptical Elliptical 5 min I 3 R 3   Knee/Hip Exercises: Standing   Forward Step Up Both;2 sets;10 reps;Step Height: 6"   Knee/Hip Exercises: Seated   Other Seated Knee/Hip Exercises sit to stand x 10 with slight fingertip hand hold; added 2 in block under feet to simulate lower seat pt needing UE support x 15 reps                PT Education - 07/07/15 1638    Education provided Yes   Education  Details posture education during sitting, taking breaks with thoracic opening and pelvic tilts.  Stretching and mobility after car ride   Person(s) Educated Patient   Methods Explanation;Demonstration;Tactile cues;Verbal cues   Comprehension Verbalized understanding;Returned demonstration;Verbal cues required          PT Short Term Goals - 05/21/15 1610    PT SHORT TERM GOAL #1   Title independent with HEP   Status Achieved   PT SHORT TERM GOAL #2   Title walk with walker with step through gait   Status Achieved           PT Long Term Goals - 06/24/15 1656    PT LONG TERM GOAL #4   Title put on shoes and socks independently   Status Partially Met               Plan - 07/07/15 1639    Clinical Impression Statement Pt continues to improve with exercises with less hand held support.  Pt stated she was having difficulty stepping over computer wires in her  home when working and needed to get up for items.  We worked on side stepping over half foam roller.  She performed well decreasing her need for HHA by second set.     Pt will benefit from skilled therapeutic intervention in order to improve on the following deficits Abnormal gait;Decreased strength;Pain   Rehab Potential Excellent   PT Next Visit Plan continue with stretching of quads, hip flexors, piriformis as well as gait, and functional activities.   PT Home Exercise Plan posture education, stretching   Consulted and Agree with Plan of Care Patient        Problem List Patient Active Problem List   Diagnosis Date Noted  . Gluten intolerance 04/27/2015  . Postoperative anemia due to acute blood loss 04/27/2015  . Closed hip fracture 04/22/2015  . Leg edema, left 04/22/2015  . Acute blood loss anemia 12/23/2014  . Fracture, intertrochanteric, left femur 12/18/2014  . Fall   . Subtrochanteric fracture of femur 12/16/2014  . UTI (urinary tract infection) 12/16/2014  . Leukocytosis 12/16/2014  . Hip fracture requiring operative repair   . Closed left subtrochanteric femur fracture 12/15/2014  . Lesion of right humerus 12/15/2014  . Osteoporosis, unspecified 10/15/2013  . HTN (hypertension) 07/16/2013  . Hypothyroidism 07/16/2013  . Osteopenia 07/16/2013  . Environmental allergies 07/16/2013  . Obesity (BMI 30-39.9) 07/16/2013    Sumner Boast., PT 07/07/2015, 4:59 PM  [place] [address] [place], [place], [address] Phone: [phone]   Fax:  [phone]

## 2015-07-09 ENCOUNTER — Ambulatory Visit: Payer: 59 | Admitting: Physical Therapy

## 2015-07-13 ENCOUNTER — Encounter: Payer: Self-pay | Admitting: Physical Therapy

## 2015-07-13 ENCOUNTER — Ambulatory Visit: Payer: 59 | Admitting: Physical Therapy

## 2015-07-13 DIAGNOSIS — R262 Difficulty in walking, not elsewhere classified: Secondary | ICD-10-CM | POA: Diagnosis not present

## 2015-07-13 DIAGNOSIS — R29898 Other symptoms and signs involving the musculoskeletal system: Secondary | ICD-10-CM

## 2015-07-13 DIAGNOSIS — R269 Unspecified abnormalities of gait and mobility: Secondary | ICD-10-CM

## 2015-07-13 NOTE — Therapy (Signed)
[address] [place], [place], [address] Phone: [phone]   Fax:  [phone]  Physical Therapy Treatment  Patient Details  Name: [name] MRN: [id number] Date of Birth: 05-06-1951 Referring Provider:  Paralee Cancel, MD  Encounter Date: 07/13/2015      PT End of Session - 07/13/15 1710    Visit Number 14   PT Start Time 2010   PT Stop Time 0712   PT Time Calculation (min) 63 min   Activity Tolerance Patient tolerated treatment well   Behavior During Therapy [facility] for tasks assessed/performed      Past Medical History  Diagnosis Date  . Chickenpox   . Genital warts   . Environmental allergies   . HTN (hypertension)   . Hyperlipidemia   . Thyroid disease   . Thyroid nodule   . Hypothyroidism   . Complication of anesthesia     "hard to wake up:  Marland Kitchen Family history of adverse reaction to anesthesia     "sister & mom are hard to wake up:  . Heart murmur   . 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 Carpal tunnel release Bilateral 1990's  . Cystectomy      From the cervix  . Intramedullary (im) nail intertrochanteric Left 12/15/2014    Procedure: INTRAMEDULLARY (IM) NAIL INTERTROCHANTRIC;  Surgeon: Augustin Schooling, MD;  [facility];  Service: Orthopedics;  Laterality: Left;  . Fracture surgery    . 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  Visit Diagnosis:  Abnormality of gait - Plan: PT plan of care cert/re-cert  Difficulty walking - Plan: PT plan of care cert/re-cert  Weakness of left hip - Plan: PT plan of care cert/re-cert      Subjective Assessment - 07/13/15 1613    Subjective Reports that she was  sitting most of the dya and that she was having some increased pain in the left hip rated a 4-5/10.  Better once she got up and started moving.   Currently in Pain? No/denies            [facility] PT Assessment - 07/13/15 0001    AROM   Left Hip Flexion 80   Left Knee Flexion 120                     [facility] Adult PT Treatment/Exercise - 07/13/15 0001    Transfers   Comments sit stand without use of hands   Ambulation/Gait   Gait Comments gait with two canes to simulae trekking poles to help with posture and arm swing during gait, stairs reciprocally   High Level Balance   High Level Balance Comments resisted    Knee/Hip Exercises: Stretches   Passive Hamstring Stretch 2 reps;30 seconds   Hip Flexor Stretch 3 reps;30 seconds   Piriformis Stretch 3 reps;30 seconds   Piriformis Stretch Limitations worked on a few different positions to stretch the piriformis   Knee/Hip Exercises: Aerobic   Elliptical Elliptical 5 min I 10 R 6   Knee/Hip Exercises: Standing   Lateral Step Up Left;2 sets;10 reps   Lateral Step Up Limitations  worked on step over object to simulate home desk environment   Knee/Hip Exercises: Supine   Bridges Strengthening;2 sets;15 reps                  PT Short Term Goals - 05/21/15 1610    PT SHORT TERM GOAL #1   Title independent with HEP   Status Achieved   PT SHORT TERM GOAL #2   Title walk with walker with step through gait   Status Achieved           PT Long Term Goals - 07/13/15 1712    PT LONG TERM GOAL #1   Title Patient will be able to independently SLR with LLE   Status Achieved   PT LONG TERM GOAL #2   Title Patient will report 50% subjective decrease in pain with 3-4/10 worst rating   Status Partially Met   PT LONG TERM GOAL #3   Title no pain with ADL's   Status Partially Met   PT LONG TERM GOAL #4   Title put on shoes and socks independently   Status Partially Met               Plan - 07/13/15 1710     Clinical Impression Statement Patient is improving overall with increased strength and ROM as well as improved ability to walk.  She still is having some difficulty with fatigue, difficulty sitting for long periods and with ability to put on shoes and socks   Pt will benefit from skilled therapeutic intervention in order to improve on the following deficits Abnormal gait;Decreased strength;Pain   PT Next Visit Plan will work on the ROM for ADL function and better gait   Consulted and Agree with Plan of Care Patient        Problem List Patient Active Problem List   Diagnosis Date Noted  . Gluten intolerance 04/27/2015  . Postoperative anemia due to acute blood loss 04/27/2015  . Closed hip fracture 04/22/2015  . Leg edema, left 04/22/2015  . Acute blood loss anemia 12/23/2014  . Fracture, intertrochanteric, left femur 12/18/2014  . Fall   . Subtrochanteric fracture of femur 12/16/2014  . UTI (urinary tract infection) 12/16/2014  . Leukocytosis 12/16/2014  . Hip fracture requiring operative repair   . Closed left subtrochanteric femur fracture 12/15/2014  . Lesion of right humerus 12/15/2014  . Osteoporosis, unspecified 10/15/2013  . HTN (hypertension) 07/16/2013  . Hypothyroidism 07/16/2013  . Osteopenia 07/16/2013  . Environmental allergies 07/16/2013  . Obesity (BMI 30-39.9) 07/16/2013    Sumner Boast., PT 07/13/2015, 5:14 PM  [address] [place], [place], [address] Phone: [phone]   Fax:  [phone]

## 2015-07-16 ENCOUNTER — Encounter: Payer: Self-pay | Admitting: Physical Therapy

## 2015-07-16 ENCOUNTER — Ambulatory Visit: Payer: 59 | Admitting: Physical Therapy

## 2015-07-16 DIAGNOSIS — M25552 Pain in left hip: Secondary | ICD-10-CM

## 2015-07-16 DIAGNOSIS — R269 Unspecified abnormalities of gait and mobility: Secondary | ICD-10-CM

## 2015-07-16 DIAGNOSIS — R29898 Other symptoms and signs involving the musculoskeletal system: Secondary | ICD-10-CM

## 2015-07-16 DIAGNOSIS — R262 Difficulty in walking, not elsewhere classified: Secondary | ICD-10-CM | POA: Diagnosis not present

## 2015-07-16 NOTE — Therapy (Signed)
[address] [place], [place], [address] Phone: [phone]   Fax:  [phone]  Physical Therapy Treatment  Patient Details  Name: [name] MRN: [id number] Date of Birth: 07-21-1951 Referring Provider:  Paralee Cancel, MD  Encounter Date: 07/16/2015      PT End of Session - 07/16/15 1703    Visit Number 15   Number of Visits 20   Date for PT Re-Evaluation 08/07/15   PT Start Time 1625   PT Stop Time 1725   PT Time Calculation (min) 60 min      Past Medical History  Diagnosis Date  . Chickenpox   . Genital warts   . Environmental allergies   . HTN (hypertension)   . Hyperlipidemia   . Thyroid disease   . Thyroid nodule   . Hypothyroidism   . Complication of anesthesia     "hard to wake up:  Marland Kitchen Family history of adverse reaction to anesthesia     "sister & mom are hard to wake up:  . Heart murmur   . 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 Carpal tunnel release Bilateral 1990's  . Cystectomy      From the cervix  . Intramedullary (im) nail intertrochanteric Left 12/15/2014    Procedure: INTRAMEDULLARY (IM) NAIL INTERTROCHANTRIC;  Surgeon: Augustin Schooling, MD;  [facility];  Service: Orthopedics;  Laterality: Left;  . Fracture surgery    . Total hip arthroplasty Left 04/24/2015    Procedure:  TOTAL HIP ARTHROPLASTY AND REMOVAL OF IM AFFIXUS NAIL ;  Surgeon: Paralee Cancel, MD;  Location: [place];  Service: Orthopedics;  Laterality: Left;    There were no vitals filed for this visit.  Visit Diagnosis:  Abnormality of gait  Weakness of left hip  Left hip pain      Subjective Assessment - 07/16/15 1623    Subjective MD pleased with progress, keep up with PT and hip precautions. Injection in knee.   Currently in Pain? Yes   Pain Score 3    Pain Location Knee   Pain Orientation Left                          [facility] Adult PT Treatment/Exercise - 07/16/15 0001    Ambulation/Gait   Gait Comments gait working on increased velocity,stride and arm swing   High Level Balance   High Level Balance Activities --  airex stepping over and SW, SLS with cone tap   Knee/Hip Exercises: Aerobic   Stationary Bike Nustep L6 8 min   Elliptical I 6 R 4 3 fwd/3 back   Tread Mill 5 min  1.5 mph   Knee/Hip Exercises: Standing   Functional Squat 2 sets;10 reps  yellow weighted ball   Wall Squat 1 set;5 reps;10 seconds   Other Standing Knee Exercises fitter flex,ext and abd 2 blue 15 on Left leg                  PT Short Term Goals - 05/21/15 1610    PT SHORT TERM GOAL #1   Title independent with HEP   Status Achieved   PT SHORT TERM GOAL #2   Title walk with walker with step through gait   Status Achieved           PT Long  Term Goals - 07/16/15 1704    PT LONG TERM GOAL #1   Title Patient will be able to independently SLR with LLE   Status Achieved   PT LONG TERM GOAL #2   Title Patient will report 50% subjective decrease in pain with 3-4/10 worst rating   Status Partially Met   PT LONG TERM GOAL #3   Title no pain with ADL's   Status Partially Met   PT LONG TERM GOAL #4   Title put on shoes and socks independently   Status Not Met               Plan - 07/16/15 1703    Clinical Impression Statement working on endurance and stamina, pt still requires freq rest and has poor endurance   PT Next Visit Plan gait/strength/func        Problem List Patient Active Problem List   Diagnosis Date Noted  . Gluten intolerance 04/27/2015  . Postoperative anemia due to acute blood loss 04/27/2015  . Closed hip fracture 04/22/2015  . Leg edema, left 04/22/2015  . Acute blood loss anemia 12/23/2014  . Fracture, intertrochanteric, left femur 12/18/2014  . Fall   . Subtrochanteric fracture of femur 12/16/2014  . UTI (urinary tract infection)  12/16/2014  . Leukocytosis 12/16/2014  . Hip fracture requiring operative repair   . Closed left subtrochanteric femur fracture 12/15/2014  . Lesion of right humerus 12/15/2014  . Osteoporosis, unspecified 10/15/2013  . HTN (hypertension) 07/16/2013  . Hypothyroidism 07/16/2013  . Osteopenia 07/16/2013  . Environmental allergies 07/16/2013  . Obesity (BMI 30-39.9) 07/16/2013    PAYSEUR,ANGIE PTA 07/16/2015, 5:05 PM  [address] [place], [place], [address] Phone: [phone]   Fax:  [phone]

## 2015-07-21 ENCOUNTER — Ambulatory Visit: Payer: 59 | Admitting: Physical Therapy

## 2015-07-21 DIAGNOSIS — M6289 Other specified disorders of muscle: Secondary | ICD-10-CM

## 2015-07-21 DIAGNOSIS — M25552 Pain in left hip: Secondary | ICD-10-CM

## 2015-07-21 DIAGNOSIS — R269 Unspecified abnormalities of gait and mobility: Secondary | ICD-10-CM

## 2015-07-21 DIAGNOSIS — R29898 Other symptoms and signs involving the musculoskeletal system: Secondary | ICD-10-CM

## 2015-07-21 DIAGNOSIS — R262 Difficulty in walking, not elsewhere classified: Secondary | ICD-10-CM | POA: Diagnosis not present

## 2015-07-21 NOTE — Therapy (Signed)
[address] [place], [place], [address] Phone: [phone]   Fax:  [phone]  Physical Therapy Treatment  Patient Details  Name: [name] MRN: [id number] Date of Birth: [birth year] Referring Provider:  Paralee Cancel, MD  Encounter Date: 07/21/2015      PT End of Session - 07/21/15 1629    Visit Number 16   Number of Visits 20   Date for PT Re-Evaluation 08/07/15   PT Start Time 1528   PT Stop Time 1630   PT Time Calculation (min) 62 min   Activity Tolerance Patient tolerated treatment well   Behavior During Therapy [facility] for tasks assessed/performed      Past Medical History  Diagnosis Date  . Chickenpox   . Genital warts   . Environmental allergies   . HTN (hypertension)   . Hyperlipidemia   . Thyroid disease   . Thyroid nodule   . Hypothyroidism   . Complication of anesthesia     "hard to wake up:  Marland Kitchen Family history of adverse reaction to anesthesia     "sister & mom are hard to wake up:  . Heart murmur   . 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 Carpal tunnel release Bilateral 1990's  . Cystectomy      From the cervix  . Intramedullary (im) nail intertrochanteric Left 12/15/2014    Procedure: INTRAMEDULLARY (IM) NAIL INTERTROCHANTRIC;  Surgeon: Augustin Schooling, MD;  [facility];  Service: Orthopedics;  Laterality: Left;  . Fracture surgery    . Total hip arthroplasty Left 04/24/2015    Procedure:  TOTAL HIP ARTHROPLASTY AND REMOVAL OF IM AFFIXUS NAIL ;  Surgeon: Paralee Cancel, MD;  Location: [place];  Service: Orthopedics;  Laterality: Left;    There were no vitals filed for this visit.  Visit Diagnosis:  Abnormality of gait  Weakness of left hip  Left hip pain  Difficulty walking  Leg fascia tightness  Pain in hip joint, left      Subjective Assessment - 07/21/15 1535    Subjective Pt  reports she was exhausted after the last treatment as well as some adductor soreness.  I had some really good moments on the weekend and some that weren't as good but the good outweigh the bad.                           [facility] Adult PT Treatment/Exercise - 07/21/15 0001    Ambulation/Gait   Gait Comments gait working on increased velocity,stride and arm swing, 2 flights   resisted forward/backward   High Level Balance   High Level Balance Activities Side stepping;Marching forwards  over obstacles there and back x 2   High Level Balance Comments marching on airex x 20 x 2   Knee/Hip Exercises: Aerobic   Stationary Bike Nustep L6 8 min   Elliptical I 6 R 4 3 fwd/3 back   Tread Mill 5 min   Knee/Hip Exercises: Standing   Functional Squat 3 sets;10 reps  1 set with yellow ball, 2 sets with 2' box no ball                PT Education - 07/21/15 1629    Education provided Yes   Education Details walking   Person(s) Educated Patient   Methods Explanation  Comprehension Verbalized understanding          PT Short Term Goals - 05/21/15 1610    PT SHORT TERM GOAL #1   Title independent with HEP   Status Achieved   PT SHORT TERM GOAL #2   Title walk with walker with step through gait   Status Achieved           PT Long Term Goals - 07/16/15 1704    PT LONG TERM GOAL #1   Title Patient will be able to independently SLR with LLE   Status Achieved   PT LONG TERM GOAL #2   Title Patient will report 50% subjective decrease in pain with 3-4/10 worst rating   Status Partially Met   PT LONG TERM GOAL #3   Title no pain with ADL's   Status Partially Met   PT LONG TERM GOAL #4   Title put on shoes and socks independently   Status Not Met               Plan - 07/21/15 1631    Clinical Impression Statement pt continues to improve with strength.  Was able to perform sit to stand without UE support on 2' box.  still requires rest between reisted gait  and LE exercises.          Problem List Patient Active Problem List   Diagnosis Date Noted  . Gluten intolerance 04/27/2015  . Postoperative anemia due to acute blood loss 04/27/2015  . Closed hip fracture 04/22/2015  . Leg edema, left 04/22/2015  . Acute blood loss anemia 12/23/2014  . Fracture, intertrochanteric, left femur 12/18/2014  . Fall   . Subtrochanteric fracture of femur 12/16/2014  . UTI (urinary tract infection) 12/16/2014  . Leukocytosis 12/16/2014  . Hip fracture requiring operative repair   . Closed left subtrochanteric femur fracture 12/15/2014  . Lesion of right humerus 12/15/2014  . Osteoporosis, unspecified 10/15/2013  . HTN (hypertension) 07/16/2013  . Hypothyroidism 07/16/2013  . Osteopenia 07/16/2013  . Environmental allergies 07/16/2013  . Obesity (BMI 30-39.9) 07/16/2013    Sumner Boast., PT 07/21/2015, 4:46 PM  [address] [place], [place], [address] Phone: [phone]   Fax:  [phone]

## 2015-07-24 ENCOUNTER — Ambulatory Visit: Payer: 59 | Admitting: Physical Therapy

## 2015-07-24 DIAGNOSIS — R29898 Other symptoms and signs involving the musculoskeletal system: Secondary | ICD-10-CM

## 2015-07-24 DIAGNOSIS — R269 Unspecified abnormalities of gait and mobility: Secondary | ICD-10-CM

## 2015-07-24 DIAGNOSIS — R262 Difficulty in walking, not elsewhere classified: Secondary | ICD-10-CM | POA: Diagnosis not present

## 2015-07-24 NOTE — Therapy (Signed)
[address] [place], [place], [address] Phone: [phone]   Fax:  [phone]  Physical Therapy Treatment  Patient Details  Name: [name] MRN: [id number] Date of Birth: 1951/08/15 Referring Provider:  Paralee Cancel, MD  Encounter Date: 07/24/2015      PT End of Session - 07/24/15 1115    Visit Number 17   Date for PT Re-Evaluation 08/07/15   PT Start Time 1014   PT Stop Time 1115   PT Time Calculation (min) 61 min   Activity Tolerance Patient tolerated treatment well   Behavior During Therapy [facility] for tasks assessed/performed      Past Medical History  Diagnosis Date  . Chickenpox   . Genital warts   . Environmental allergies   . HTN (hypertension)   . Hyperlipidemia   . Thyroid disease   . Thyroid nodule   . Hypothyroidism   . Complication of anesthesia     "hard to wake up:  Marland Kitchen Family history of adverse reaction to anesthesia     "sister & mom are hard to wake up:  . Heart murmur   . 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 Carpal tunnel release Bilateral 1990's  . Cystectomy      From the cervix  . Intramedullary (im) nail intertrochanteric Left 12/15/2014    Procedure: INTRAMEDULLARY (IM) NAIL INTERTROCHANTRIC;  Surgeon: Augustin Schooling, MD;  [facility];  Service: Orthopedics;  Laterality: Left;  . Fracture surgery    . Total hip arthroplasty Left 04/24/2015    Procedure:  TOTAL HIP ARTHROPLASTY AND REMOVAL OF IM AFFIXUS NAIL ;  Surgeon: Paralee Cancel, MD;  Location: [place];  Service: Orthopedics;  Laterality: Left;    There were no vitals filed for this visit.  Visit Diagnosis:  Abnormality of gait  Weakness of left hip  Difficulty walking      Subjective Assessment - 07/24/15 1016    Subjective Pt is doing well, I was a little tired after the last session but not exhausted.     Currently in Pain? No/denies                         [facility] Adult PT Treatment/Exercise - 07/24/15 0001    Ambulation/Gait   Gait Comments stairs x 2   Posture/Postural Control   Posture Comments foward lean, rounded shoulders   High Level Balance   High Level Balance Activities Side stepping  resisted forward w/band and push forward, manual resist   High Level Balance Comments marching on airex x 20 x 2   Knee/Hip Exercises: Aerobic   Stationary Bike Nustep L6 8 min   Elliptical I 6 R 4 4 fwd/4 back   Knee/Hip Exercises: Standing   Step Down Both;2 sets;15 reps  HHA   Wall Squat 1 set;10 reps   Shoulder Exercises: Standing   Extension Both;15 reps;Strengthening  x 2   Theraband Level (Shoulder Extension) Level 3 (Green)   Row Strengthening;Both;15 reps  x2   Theraband Level (Shoulder Row) Level 3 Nyoka Cowden)                PT Education - 07/24/15 1115    Education provided Yes   Education Details posture, scap rows, extension, provided green theraband   Person(s) Educated Patient   Methods Explanation;Demonstration  Comprehension Verbalized understanding          PT Short Term Goals - 05/21/15 1610    PT SHORT TERM GOAL #1   Title independent with HEP   Status Achieved   PT SHORT TERM GOAL #2   Title walk with walker with step through gait   Status Achieved           PT Long Term Goals - 07/16/15 1704    PT LONG TERM GOAL #1   Title Patient will be able to independently SLR with LLE   Status Achieved   PT LONG TERM GOAL #2   Title Patient will report 50% subjective decrease in pain with 3-4/10 worst rating   Status Partially Met   PT LONG TERM GOAL #3   Title no pain with ADL's   Status Partially Met   PT LONG TERM GOAL #4   Title put on shoes and socks independently   Status Not Met               Plan - 07/24/15 1116    Clinical Impression Statement Pt still has decreased endurance but is making improvments with  increased CV work.  Needed HHA for step downs on 2' box.     Pt will benefit from skilled therapeutic intervention in order to improve on the following deficits Abnormal gait;Decreased strength;Pain   PT Next Visit Plan gait/strength/func   PT Home Exercise Plan posture education, stretching, theraband   Consulted and Agree with Plan of Care Patient        Problem List Patient Active Problem List   Diagnosis Date Noted  . Gluten intolerance 04/27/2015  . Postoperative anemia due to acute blood loss 04/27/2015  . Closed hip fracture 04/22/2015  . Leg edema, left 04/22/2015  . Acute blood loss anemia 12/23/2014  . Fracture, intertrochanteric, left femur 12/18/2014  . Fall   . Subtrochanteric fracture of femur 12/16/2014  . UTI (urinary tract infection) 12/16/2014  . Leukocytosis 12/16/2014  . Hip fracture requiring operative repair   . Closed left subtrochanteric femur fracture 12/15/2014  . Lesion of right humerus 12/15/2014  . Osteoporosis, unspecified 10/15/2013  . HTN (hypertension) 07/16/2013  . Hypothyroidism 07/16/2013  . Osteopenia 07/16/2013  . Environmental allergies 07/16/2013  . Obesity (BMI 30-39.9) 07/16/2013    Sumner Boast., PT 07/24/2015, 11:28 AM  [address] [place], [place], [address] Phone: [phone]   Fax:  [phone]

## 2015-07-28 ENCOUNTER — Ambulatory Visit: Payer: 59 | Attending: Orthopedic Surgery | Admitting: Physical Therapy

## 2015-07-28 ENCOUNTER — Encounter: Payer: Self-pay | Admitting: Physical Therapy

## 2015-07-28 DIAGNOSIS — R269 Unspecified abnormalities of gait and mobility: Secondary | ICD-10-CM | POA: Insufficient documentation

## 2015-07-28 DIAGNOSIS — M25552 Pain in left hip: Secondary | ICD-10-CM | POA: Diagnosis present

## 2015-07-28 DIAGNOSIS — R262 Difficulty in walking, not elsewhere classified: Secondary | ICD-10-CM | POA: Insufficient documentation

## 2015-07-28 DIAGNOSIS — R29898 Other symptoms and signs involving the musculoskeletal system: Secondary | ICD-10-CM | POA: Insufficient documentation

## 2015-07-28 DIAGNOSIS — M629 Disorder of muscle, unspecified: Secondary | ICD-10-CM | POA: Diagnosis present

## 2015-07-28 NOTE — Therapy (Signed)
[address] [place], [place], [address] Phone: [phone]   Fax:  [phone]  Physical Therapy Treatment  Patient Details  Name: [name] MRN: [id number] Date of Birth: Apr 29, 1951 Referring Provider:  Paralee Cancel, MD  Encounter Date: 07/28/2015      PT End of Session - 07/28/15 1649    Visit Number 18   Number of Visits 20   Date for PT Re-Evaluation 08/07/15   PT Start Time 7622   PT Stop Time 1700   PT Time Calculation (min) 52 min      Past Medical History  Diagnosis Date  . Chickenpox   . Genital warts   . Environmental allergies   . HTN (hypertension)   . Hyperlipidemia   . Thyroid disease   . Thyroid nodule   . Hypothyroidism   . Complication of anesthesia     "hard to wake up:  Marland Kitchen Family history of adverse reaction to anesthesia     "sister & mom are hard to wake up:  . Heart murmur   . 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 Carpal tunnel release Bilateral 1990's  . Cystectomy      From the cervix  . Intramedullary (im) nail intertrochanteric Left 12/15/2014    Procedure: INTRAMEDULLARY (IM) NAIL INTERTROCHANTRIC;  Surgeon: Augustin Schooling, MD;  [facility];  Service: Orthopedics;  Laterality: Left;  . Fracture surgery    . Total hip arthroplasty Left 04/24/2015    Procedure:  TOTAL HIP ARTHROPLASTY AND REMOVAL OF IM AFFIXUS NAIL ;  Surgeon: Paralee Cancel, MD;  Location: [place];  Service: Orthopedics;  Laterality: Left;    There were no vitals filed for this visit.  Visit Diagnosis:  Abnormality of gait  Weakness of left hip  Difficulty walking  Left hip pain      Subjective Assessment - 07/28/15 1625    Subjective Has annoying post hip pain after sitting for awhile at the computer.  I started going back to the gym again using the track and I did 10 laps, 1 lap=1/16th of a mile.    Currently in Pain? No/denies                         [facility] Adult PT Treatment/Exercise - 07/28/15 0001    Knee/Hip Exercises: Aerobic   Elliptical I 6 R 4 4 fwd/4 back   Knee/Hip Exercises: Standing   Step Down Both;2 sets;15 reps   Step Down Limitations 2"   Wall Squat 2 sets;10 reps  2-3 sec holds   Other Standing Knee Exercises marching 2 x 20 each leg on airex, lateral step over on airex 2 x 10 each way, forward stepovers 2 x 10 forward/backward                  PT Short Term Goals - 05/21/15 1610    PT SHORT TERM GOAL #1   Title independent with HEP   Status Achieved   PT SHORT TERM GOAL #2   Title walk with walker with step through gait   Status Achieved           PT Long Term Goals - 07/16/15 1704    PT LONG TERM GOAL #1   Title Patient will be able to independently SLR with LLE   Status  Achieved   PT LONG TERM GOAL #2   Title Patient will report 50% subjective decrease in pain with 3-4/10 worst rating   Status Partially Met   PT LONG TERM GOAL #3   Title no pain with ADL's   Status Partially Met   PT LONG TERM GOAL #4   Title put on shoes and socks independently   Status Not Met               Plan - 07/28/15 1649    Clinical Impression Statement Pt still lacks endurance with reps of 15.  Worked today on noncompliant surfaces doing step overs lateral and forward and marching.  This was improved from previous as she needed HHA before.  However, pt needed HHA x 1 for step downs on 2" box.  Pt brought hiking poles today to work on longer walking in hallway and stairs since it was raining.     Rehab Potential Good        Problem List Patient Active Problem List   Diagnosis Date Noted  . Gluten intolerance 04/27/2015  . Postoperative anemia due to acute blood loss 04/27/2015  . Closed hip fracture 04/22/2015  . Leg edema, left 04/22/2015  . Acute blood loss anemia 12/23/2014  . Fracture, intertrochanteric, left femur  12/18/2014  . Fall   . Subtrochanteric fracture of femur 12/16/2014  . UTI (urinary tract infection) 12/16/2014  . Leukocytosis 12/16/2014  . Hip fracture requiring operative repair   . Closed left subtrochanteric femur fracture 12/15/2014  . Lesion of right humerus 12/15/2014  . Osteoporosis, unspecified 10/15/2013  . HTN (hypertension) 07/16/2013  . Hypothyroidism 07/16/2013  . Osteopenia 07/16/2013  . Environmental allergies 07/16/2013  . Obesity (BMI 30-39.9) 07/16/2013    Sumner Boast., PT 07/28/2015, 5:16 PM  [address] [place], [place], [address] Phone: [phone]   Fax:  [phone]

## 2015-07-30 ENCOUNTER — Encounter: Payer: Self-pay | Admitting: Physical Therapy

## 2015-07-30 ENCOUNTER — Ambulatory Visit: Payer: 59 | Admitting: Physical Therapy

## 2015-07-30 DIAGNOSIS — M25552 Pain in left hip: Secondary | ICD-10-CM

## 2015-07-30 DIAGNOSIS — R262 Difficulty in walking, not elsewhere classified: Secondary | ICD-10-CM

## 2015-07-30 DIAGNOSIS — R29898 Other symptoms and signs involving the musculoskeletal system: Secondary | ICD-10-CM

## 2015-07-30 DIAGNOSIS — M6289 Other specified disorders of muscle: Secondary | ICD-10-CM

## 2015-07-30 DIAGNOSIS — R269 Unspecified abnormalities of gait and mobility: Secondary | ICD-10-CM | POA: Diagnosis not present

## 2015-07-30 NOTE — Therapy (Signed)
[address] [place], [place], [address] Phone: [phone]   Fax:  [phone]  Physical Therapy Treatment  Patient Details  Name: [name] MRN: [id number] Date of Birth: 1951-08-29 Referring Provider:  Paralee Cancel, MD  Encounter Date: 07/30/2015      PT End of Session - 07/30/15 1656    Visit Number 19   Date for PT Re-Evaluation 08/07/15   PT Start Time 1614   PT Stop Time 1714   PT Time Calculation (min) 60 min   Activity Tolerance Patient tolerated treatment well   Behavior During Therapy [facility] for tasks assessed/performed      Past Medical History  Diagnosis Date  . Chickenpox   . Genital warts   . Environmental allergies   . HTN (hypertension)   . Hyperlipidemia   . Thyroid disease   . Thyroid nodule   . Hypothyroidism   . Complication of anesthesia     "hard to wake up:  Marland Kitchen Family history of adverse reaction to anesthesia     "sister & mom are hard to wake up:  . Heart murmur   . 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 Carpal tunnel release Bilateral 1990's  . Cystectomy      From the cervix  . Intramedullary (im) nail intertrochanteric Left 12/15/2014    Procedure: INTRAMEDULLARY (IM) NAIL INTERTROCHANTRIC;  Surgeon: Augustin Schooling, MD;  Location: [facility];  Service: Orthopedics;  Laterality: Left;  . Fracture surgery    . 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  Visit Diagnosis:  Abnormality of gait  Weakness of left hip  Difficulty walking  Leg fascia tightness  Pain in hip joint, left      Subjective Assessment - 07/30/15 1615    Subjective I have been doing well. I was a little tired  after the last treatment so i went home and rested and then went to gym and walked 10 laps, yesterday I did 12 laps, i rested for a bit after 10 and then did 10 more. .     Currently in Pain? No/denies                         [facility] Adult PT Treatment/Exercise - 07/30/15 0001    High Level Balance   High Level Balance Comments step ups 2 x 10 on Bosu ball  HHA x 2   Knee/Hip Exercises: Aerobic   Elliptical I 6 R 4 4 fwd/4 back   Isokinetic Nustep 15 min   Knee/Hip Exercises: Standing   Step Down Both;2 sets;15 reps   Step Down Limitations 2"   Other Standing Knee Exercises stairs x 2, half lap around building  had to stop at top of hill to rest                  PT Short Term Goals - 05/21/15 1610    PT SHORT TERM GOAL #1   Title independent with HEP   Status Achieved   PT SHORT TERM GOAL #2   Title walk with walker with step through gait  Status Achieved           PT Long Term Goals - 07/16/15 1704    PT LONG TERM GOAL #1   Title Patient will be able to independently SLR with LLE   Status Achieved   PT LONG TERM GOAL #2   Title Patient will report 50% subjective decrease in pain with 3-4/10 worst rating   Status Partially Met   PT LONG TERM GOAL #3   Title no pain with ADL's   Status Partially Met   PT LONG TERM GOAL #4   Title put on shoes and socks independently   Status Not Met               Plan - 07/30/15 1656    Clinical Impression Statement Pt lacks endurance with outside walking going up slight incline.  Had to stop at top of hill after half lap.  Increased to step ups on bosu ball today with HHA x 2   Pt will benefit from skilled therapeutic intervention in order to improve on the following deficits Abnormal gait;Decreased strength;Pain   Rehab Potential Good   PT Next Visit Plan possible d/c, go over gym activities   Consulted and Agree with Plan of Care Patient        Problem List Patient Active Problem List    Diagnosis Date Noted  . Gluten intolerance 04/27/2015  . Postoperative anemia due to acute blood loss 04/27/2015  . Closed hip fracture 04/22/2015  . Leg edema, left 04/22/2015  . Acute blood loss anemia 12/23/2014  . Fracture, intertrochanteric, left femur 12/18/2014  . Fall   . Subtrochanteric fracture of femur 12/16/2014  . UTI (urinary tract infection) 12/16/2014  . Leukocytosis 12/16/2014  . Hip fracture requiring operative repair   . Closed left subtrochanteric femur fracture 12/15/2014  . Lesion of right humerus 12/15/2014  . Osteoporosis, unspecified 10/15/2013  . HTN (hypertension) 07/16/2013  . Hypothyroidism 07/16/2013  . Osteopenia 07/16/2013  . Environmental allergies 07/16/2013  . Obesity (BMI 30-39.9) 07/16/2013    Sumner Boast., PT 07/30/2015, 5:28 PM  [address] [place], [place], [address] Phone: [phone]   Fax:  [phone]

## 2015-08-05 ENCOUNTER — Ambulatory Visit: Payer: 59 | Admitting: Physical Therapy

## 2015-08-05 DIAGNOSIS — R29898 Other symptoms and signs involving the musculoskeletal system: Secondary | ICD-10-CM

## 2015-08-05 DIAGNOSIS — R269 Unspecified abnormalities of gait and mobility: Secondary | ICD-10-CM | POA: Diagnosis not present

## 2015-08-05 DIAGNOSIS — R262 Difficulty in walking, not elsewhere classified: Secondary | ICD-10-CM

## 2015-08-05 DIAGNOSIS — M6289 Other specified disorders of muscle: Secondary | ICD-10-CM

## 2015-08-05 DIAGNOSIS — M25552 Pain in left hip: Secondary | ICD-10-CM

## 2015-08-05 NOTE — Therapy (Signed)
[address] [place], [place], [address] Phone: [phone]   Fax:  [phone]  Physical Therapy Treatment  Patient Details  Name: [name] MRN: [id number] Date of Birth: 1951/09/22 Referring Provider:  Paralee Cancel, MD  Encounter Date: 08/05/2015      PT End of Session - 08/05/15 1741    Visit Number 20   PT Start Time 6606   PT Stop Time 1741   PT Time Calculation (min) 84 min   Activity Tolerance Patient tolerated treatment well   Behavior During Therapy [facility] for tasks assessed/performed      Past Medical History  Diagnosis Date  . Chickenpox   . Genital warts   . Environmental allergies   . HTN (hypertension)   . Hyperlipidemia   . Thyroid disease   . Thyroid nodule   . Hypothyroidism   . Complication of anesthesia     "hard to wake up:  Marland Kitchen Family history of adverse reaction to anesthesia     "sister & mom are hard to wake up:  . Heart murmur   . 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 Carpal tunnel release Bilateral 1990's  . Cystectomy      From the cervix  . Intramedullary (im) nail intertrochanteric Left 12/15/2014    Procedure: INTRAMEDULLARY (IM) NAIL INTERTROCHANTRIC;  Surgeon: Augustin Schooling, MD;  Location: [facility];  Service: Orthopedics;  Laterality: Left;  . Fracture surgery    . 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  Visit Diagnosis:  Abnormality of gait  Weakness of left hip  Difficulty walking  Leg fascia tightness  Pain in hip joint, left  Left hip pain      Subjective Assessment - 08/05/15 1628    Subjective I'm doing ok, I have some good days and bad days.  I get pains  in the side of my left leg and sometimes in the back part, usually when I'm sitting.     Patient is accompained by: Family member   Pertinent History Pt. 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 several hours-3hrs   How long can you stand comfortably? 20 min   How long can you walk comfortably? 30 min   Currently in Pain? No/denies  before I started walking i did have pain            [facility] PT Assessment - 08/05/15 0001    AROM   Overall AROM Comments 32 degrees hip ER, hip flexion 60-Left   Strength   Left Hip Flexion 4/5   Left Hip Extension 4+/5   Left Hip External Rotation  --  4+/5   Left Hip Internal Rotation  --  4/5   Left Hip ABduction 4+/5   Left Hip ADduction 4/5   Left Knee Flexion 5/5   Left Knee Extension 5/5                     [facility] Adult PT Treatment/Exercise - 08/05/15 0001  Knee/Hip Exercises: Aerobic   Tread Mill walking around the building x 1, CW   Knee/Hip Exercises: Standing   Other Standing Knee Exercises 4-way hip red theraband  education   Knee/Hip Exercises: Seated   Other Seated Knee/Hip Exercises piriformis stretching  education   Knee/Hip Exercises: Supine   Other Supine Knee/Hip Exercises hip flexor stretch, SL off of table/bed  education                  PT Short Term Goals - 05/21/15 1610    PT SHORT TERM GOAL #1   Title independent with HEP   Status Achieved   PT SHORT TERM GOAL #2   Title walk with walker with step through gait   Status Achieved           PT Long Term Goals - 07/16/15 1704    PT LONG TERM GOAL #1   Title Patient will be able to independently SLR with LLE   Status Achieved   PT LONG TERM GOAL #2   Title Patient will report 50% subjective decrease in pain with 3-4/10 worst rating   Status Partially Met   PT LONG TERM GOAL #3   Title no pain with ADL's    Status Partially Met   PT LONG TERM GOAL #4   Title put on shoes and socks independently   Status Not Met               Plan - Aug 20, 2015 1741    Clinical Impression Statement Pt did better today walking around the parking lot.  We completed one full lap going clockwise without stopping.  This is the patient's last visit so MMT/ROM measurements were taken as well as education for hip strengthening exercises, 4-way hip, hip flexor stretching off of the bed, and scar mobilization.  Education on posture and decreasing her forward lean.     Rehab Potential Good   PT Home Exercise Plan posture education, stretching, theraband   Consulted and Agree with Plan of Care Patient          G-Codes - 20-Aug-2015 1745    Functional Assessment Tool Used FOTO   Functional Limitation Mobility: Walking and moving around   Mobility: Walking and Moving Around Current Status [phone]) At least 20 percent but less than 40 percent impaired, limited or restricted   Mobility: Walking and Moving Around Goal Status [phone]) At least 40 percent but less than 60 percent impaired, limited or restricted   Mobility: Walking and Moving Around Discharge Status [phone]) At least 20 percent but less than 40 percent impaired, limited or restricted      Problem List Patient Active Problem List   Diagnosis Date Noted  . Gluten intolerance 04/27/2015  . Postoperative anemia due to acute blood loss 04/27/2015  . Closed hip fracture 04/22/2015  . Leg edema, left 04/22/2015  . Acute blood loss anemia 12/23/2014  . Fracture, intertrochanteric, left femur 12/18/2014  . Fall   . Subtrochanteric fracture of femur 12/16/2014  . UTI (urinary tract infection) 12/16/2014  . Leukocytosis 12/16/2014  . Hip fracture requiring operative repair   . Closed left subtrochanteric femur fracture 12/15/2014  . Lesion of right humerus 12/15/2014  . Osteoporosis, unspecified 10/15/2013  . HTN (hypertension) 07/16/2013  . Hypothyroidism  07/16/2013  . Osteopenia 07/16/2013  . Environmental allergies 07/16/2013  . Obesity (BMI 30-39.9) 07/16/2013   PHYSICAL THERAPY DISCHARGE SUMMARY  Visits from Start of Care: 20  Current functional level related to goals /  functional outcomes: See above   Remaining deficits: See above    Plan: Patient agrees to discharge.  Patient goals were met. Patient is being discharged due to meeting the stated rehab goals.  ?????      Sumner Boast ., PT  08/05/2015, 5:46 PM  [address] [place], [place], [address] Phone: [phone]   Fax:  [phone]

## 2015-10-26 ENCOUNTER — Other Ambulatory Visit: Payer: Self-pay

## 2015-10-26 ENCOUNTER — Other Ambulatory Visit: Payer: Self-pay | Admitting: Internal Medicine

## 2015-10-26 DIAGNOSIS — Z1231 Encounter for screening mammogram for malignant neoplasm of breast: Secondary | ICD-10-CM

## 2015-10-26 DIAGNOSIS — E041 Nontoxic single thyroid nodule: Secondary | ICD-10-CM

## 2015-10-30 ENCOUNTER — Ambulatory Visit
Admission: RE | Admit: 2015-10-30 | Discharge: 2015-10-30 | Disposition: A | Discharge status: Home / Self Care | Payer: 59 | Source: Ambulatory Visit | Attending: Internal Medicine | Admitting: Internal Medicine

## 2015-10-30 DIAGNOSIS — E041 Nontoxic single thyroid nodule: Secondary | ICD-10-CM

## 2015-11-03 ENCOUNTER — Other Ambulatory Visit: Payer: Self-pay | Admitting: Internal Medicine

## 2015-11-03 DIAGNOSIS — E041 Nontoxic single thyroid nodule: Secondary | ICD-10-CM

## 2015-11-06 ENCOUNTER — Other Ambulatory Visit: Payer: 59

## 2015-11-13 ENCOUNTER — Ambulatory Visit
Admission: RE | Admit: 2015-11-13 | Discharge: 2015-11-13 | Disposition: A | Discharge status: Home / Self Care | Payer: 59 | Source: Ambulatory Visit

## 2015-11-13 DIAGNOSIS — Z1231 Encounter for screening mammogram for malignant neoplasm of breast: Secondary | ICD-10-CM

## 2015-11-17 ENCOUNTER — Telehealth: Payer: Self-pay

## 2015-11-17 NOTE — Telephone Encounter (Signed)
Prior authorization for diclofenac done via cover my meds, approved.

## 2015-12-04 ENCOUNTER — Other Ambulatory Visit: Payer: Self-pay | Admitting: Internal Medicine

## 2015-12-04 DIAGNOSIS — R1310 Dysphagia, unspecified: Secondary | ICD-10-CM

## 2015-12-22 ENCOUNTER — Other Ambulatory Visit: Payer: Self-pay | Admitting: Family Medicine

## 2016-05-23 NOTE — Nursing Note (Signed)
Orthostatics - Text       Orthostatics Entered On:  05/23/2016 12:42 EDT    Performed On:  05/23/2016 12:40 EDT by Gayla DossJOYNER, RN, HAYLEY C               Orthostatics   Systolic/Diastolic  Supine BP :   130 mmHg   Systolic/Diastolic  Supine BP :   78 mmHg   Pulse Supine :   75 bpm   Systolic/Diastolic  Standing BP :   130 mmHg   Systolic/Diastolic  Standing BP :   73 mmHg   Pulse Standing :   86 bpm   JOYNER, RN, HAYLEY C - 05/23/2016 12:40 EDT

## 2016-10-10 ENCOUNTER — Other Ambulatory Visit: Payer: Self-pay | Admitting: Internal Medicine

## 2016-10-10 DIAGNOSIS — Z1231 Encounter for screening mammogram for malignant neoplasm of breast: Secondary | ICD-10-CM

## 2016-11-25 ENCOUNTER — Ambulatory Visit: Payer: 59

## 2016-12-02 ENCOUNTER — Ambulatory Visit
Admission: RE | Admit: 2016-12-02 | Discharge: 2016-12-02 | Disposition: A | Discharge status: Home / Self Care | Payer: 59 | Source: Ambulatory Visit | Attending: Internal Medicine | Admitting: Internal Medicine

## 2016-12-02 DIAGNOSIS — Z1231 Encounter for screening mammogram for malignant neoplasm of breast: Secondary | ICD-10-CM

## 2016-12-26 HISTORY — PX: OTHER SURGICAL HISTORY: SHX169

## 2017-06-04 ENCOUNTER — Encounter (HOSPITAL_COMMUNITY): Payer: Self-pay | Admitting: Emergency Medicine

## 2017-06-04 ENCOUNTER — Emergency Department (HOSPITAL_COMMUNITY): Payer: 59

## 2017-06-04 ENCOUNTER — Emergency Department (HOSPITAL_COMMUNITY)
Admission: EM | Admit: 2017-06-04 | Discharge: 2017-06-04 | Disposition: A | Discharge status: Home / Self Care | Payer: 59 | Attending: Emergency Medicine | Admitting: Emergency Medicine

## 2017-06-04 DIAGNOSIS — T148XXA Other injury of unspecified body region, initial encounter: Secondary | ICD-10-CM | POA: Insufficient documentation

## 2017-06-04 DIAGNOSIS — W19XXXA Unspecified fall, initial encounter: Secondary | ICD-10-CM | POA: Insufficient documentation

## 2017-06-04 DIAGNOSIS — Y929 Unspecified place or not applicable: Secondary | ICD-10-CM | POA: Diagnosis not present

## 2017-06-04 DIAGNOSIS — Z888 Allergy status to other drugs, medicaments and biological substances status: Secondary | ICD-10-CM | POA: Insufficient documentation

## 2017-06-04 DIAGNOSIS — Z96642 Presence of left artificial hip joint: Secondary | ICD-10-CM | POA: Diagnosis not present

## 2017-06-04 DIAGNOSIS — Z91012 Allergy to eggs: Secondary | ICD-10-CM | POA: Diagnosis not present

## 2017-06-04 DIAGNOSIS — Z79891 Long term (current) use of opiate analgesic: Secondary | ICD-10-CM | POA: Insufficient documentation

## 2017-06-04 DIAGNOSIS — R0781 Pleurodynia: Secondary | ICD-10-CM | POA: Diagnosis present

## 2017-06-04 DIAGNOSIS — I10 Essential (primary) hypertension: Secondary | ICD-10-CM | POA: Insufficient documentation

## 2017-06-04 DIAGNOSIS — Y939 Activity, unspecified: Secondary | ICD-10-CM | POA: Insufficient documentation

## 2017-06-04 DIAGNOSIS — Y999 Unspecified external cause status: Secondary | ICD-10-CM | POA: Insufficient documentation

## 2017-06-04 DIAGNOSIS — Z79899 Other long term (current) drug therapy: Secondary | ICD-10-CM | POA: Insufficient documentation

## 2017-06-04 DIAGNOSIS — Z9109 Other allergy status, other than to drugs and biological substances: Secondary | ICD-10-CM | POA: Insufficient documentation

## 2017-06-04 NOTE — Discharge Instructions (Signed)
Take Tylenol as for pain. X-rays today showed nothing broken. He do have arthritis knees as well as low bone density. Wash the abrasions on your knees daily with soap and water. Signs of infection including redness around the wounds, drainage from the wounds, swelling or more pain or fever. See an urgent care center or your doctor if you think you may be developing an infection. Return if you feel worse for any reason. Your blood pressure should be rechecked within the next 3 weeks. Today's was mildly elevated at 156/84

## 2017-06-04 NOTE — ED Provider Notes (Signed)
[facility] DEPT Provider Note   CSN: [id number] Arrival date & time: 06/04/17  9470     History   Chief Complaint Chief Complaint  Patient presents with  . Fall    HPI [name] is a 66 y.o. female.  HPI Patient tripped and fell 3 days ago injuring both knees and left ribs. Left rib pain is worse when she moves her left arm or with deep inspiration. She denies any shortness of breath. Pain in knees is worse with walking improved with remaining still. No other injuries. She's treated herself with Tylenol without adequate relief. No other associated symptoms denies lightheadedness denies striking her head Past Medical History:  Diagnosis Date  . Arthritis    Bilateral in the Knees and fingers (04/22/2015)  . Chickenpox   . Complication of anesthesia    "hard to wake up:  Marland Kitchen Environmental allergies   . Family history of adverse reaction to anesthesia    "sister & mom are hard to wake up:  . Genital warts   . Heart murmur   . HTN (hypertension)   . Hyperlipidemia   . Hypothyroidism   . Thyroid disease   . Thyroid nodule     Patient Active Problem List   Diagnosis Date Noted  . Gluten intolerance 04/27/2015  . Postoperative anemia due to acute blood loss 04/27/2015  . Closed hip fracture ([place]) 04/22/2015  . Leg edema, left 04/22/2015  . Acute blood loss anemia 12/23/2014  . Fracture, intertrochanteric, left femur ([place]) 12/18/2014  . Fall   . Subtrochanteric fracture of femur ([place]) 12/16/2014  . UTI (urinary tract infection) 12/16/2014  . Leukocytosis 12/16/2014  . Hip fracture requiring operative repair ([place])   . Closed left subtrochanteric femur fracture ([place]) 12/15/2014  . Lesion of right humerus 12/15/2014  . Osteoporosis, unspecified 10/15/2013  . HTN (hypertension) 07/16/2013  . Hypothyroidism 07/16/2013  . Osteopenia 07/16/2013  . Environmental allergies 07/16/2013  . Obesity (BMI 30-39.9) 07/16/2013    Past Surgical History:  Procedure  Laterality Date  . CARPAL TUNNEL RELEASE Bilateral 1990's  . CYSTECTOMY     From the cervix  . FRACTURE SURGERY    . INTRAMEDULLARY (IM) NAIL INTERTROCHANTERIC Left 12/15/2014   Procedure: INTRAMEDULLARY (IM) NAIL INTERTROCHANTRIC;  Surgeon: Augustin Schooling, MD;  Location: [facility];  Service: Orthopedics;  Laterality: Left;  . TOTAL HIP ARTHROPLASTY Left 04/24/2015   Procedure:  TOTAL HIP ARTHROPLASTY AND REMOVAL OF IM AFFIXUS NAIL ;  Surgeon: Paralee Cancel, MD;  Location: [facility];  Service: Orthopedics;  Laterality: Left;    OB History    No data available       Home Medications    Prior to Admission medications   Medication Sig Start Date End Date Taking? Authorizing Provider  acetaminophen (TYLENOL) 325 MG tablet Take 2 tablets (650 mg total) by mouth every 6 (six) hours as needed. Patient not taking: Reported on 04/22/2015 12/16/14   Netta Cedars, MD  azelastine (ASTELIN) 0.1 % nasal spray Place 1 spray into both nostrils 2 (two) times daily. Use in each nostril as directed 08/04/14   Carollee Herter, Alferd Apa, DO  bisacodyl (DULCOLAX) 10 MG suppository Place 1 suppository (10 mg total) rectally daily as needed for moderate constipation. 04/28/15   Delfina Redwood, MD  Cyanocobalamin (VITAMIN B-12 PO) Take 1 tablet by mouth daily.    [provider]  diclofenac sodium (VOLTAREN) 1 % GEL Apply 2 g topically 4 (four) times daily. 05/04/15   [place],  Lavon Paganini, PA-C  docusate sodium (COLACE) 100 MG capsule Take 1 capsule (100 mg total) by mouth 2 (two) times daily. 04/28/15   Delfina Redwood, MD  famotidine (PEPCID) 10 MG tablet Take 1 tablet (10 mg total) by mouth 2 (two) times daily. 05/04/15   Angiulli, Lavon Paganini, PA-C  ferrous sulfate 325 (65 FE) MG tablet Take 1 tablet (325 mg total) by mouth daily with breakfast. 05/04/15   Angiulli, Lavon Paganini, PA-C  fluticasone (FLONASE) 50 MCG/ACT nasal spray Place 2 sprays into both nostrils daily. 08/04/14   Ann Held, DO    HYDROcodone-acetaminophen (NORCO/VICODIN) 5-325 MG per tablet Take 1-2 tablets by mouth every 6 (six) hours as needed for moderate pain. 05/04/15   Angiulli, Lavon Paganini, PA-C  levothyroxine (SYNTHROID) 88 MCG tablet Take 1 tablet (88 mcg total) by mouth daily. 05/04/15   Angiulli, Lavon Paganini, PA-C  loratadine (CLARITIN) 10 MG tablet Take 1 tablet (10 mg total) by mouth daily. 05/04/15   Angiulli, Lavon Paganini, PA-C  methocarbamol (ROBAXIN) 500 MG tablet Take 1 tablet (500 mg total) by mouth every 6 (six) hours as needed for muscle spasms. 05/04/15   Angiulli, Lavon Paganini, PA-C  ondansetron (ZOFRAN) 4 MG tablet Take 1 tablet (4 mg total) by mouth every 8 (eight) hours as needed for nausea or vomiting. 04/28/15   Delfina Redwood, MD  polyethylene glycol (MIRALAX / Floria Raveling) packet Take 17 g by mouth daily as needed for mild constipation. 04/28/15   Delfina Redwood, MD  valsartan (DIOVAN) 320 MG tablet TAKE 1 TABLET DAILY 08/04/14   Ann Held, DO  Vitamin D, Ergocalciferol, (DRISDOL) 50000 UNITS CAPS capsule Take 50,000 Units by mouth 3 (three) times a week.    [provider]    Family History Family History  Problem Relation Age of Onset  . Arthritis Mother   . Hyperlipidemia Mother   . High blood pressure Mother   . Diabetes Mother   . Myasthenia gravis Mother   . Heart disease Father   . Stroke Father        x's 2  . High blood pressure Father     Social History Social History  Substance Use Topics  . Smoking status: Never Smoker  . Smokeless tobacco: Never Used  . Alcohol use Yes     Comment: 04/22/2015 "a gtlass of wine maybe a couple times/month"     Allergies   Gluten meal; Soy allergy; Statins; and Eggs or egg-derived products   Review of Systems Review of Systems  Constitutional: Negative.   HENT: Negative.   Respiratory: Negative.   Cardiovascular: Positive for chest pain.       Left rib pain  Gastrointestinal: Negative.   Musculoskeletal: Positive for  arthralgias.       Bilateral knee pain  Skin: Negative.   Allergic/Immunologic:       Current on tetanus immunizations  Neurological: Negative.   Psychiatric/Behavioral: Negative.   All other systems reviewed and are negative.    Physical Exam Updated Vital Signs BP (!) 177/82 (BP Location: Left Arm)   Pulse 81   Temp 98 F (36.7 C) (Oral)   Resp 18   SpO2 100%   Physical Exam  Constitutional: She is oriented to person, place, and time. She appears well-developed and well-nourished. No distress.  Alert Glasgow Coma Score 15  HENT:  Head: Normocephalic and atraumatic.  Eyes: Conjunctivae are normal. Pupils are equal, round, and reactive to light.  Neck: Neck supple. No tracheal deviation present. No thyromegaly present.  Cardiovascular: Normal rate, regular rhythm, normal heart sounds and intact distal pulses.   No murmur heard. Pulmonary/Chest: Effort normal and breath sounds normal. She exhibits tenderness.  Tender at left lateral chest no ecchymosis or flail  Abdominal: Soft. Bowel sounds are normal. She exhibits no distension. There is no tenderness.  Musculoskeletal: Normal range of motion. She exhibits no edema or tenderness.  Left lower extremity with several 2-3 mm well-healing abrasions over anterior knee with corresponding tenderness. No deformity. DP pulse 2+. Right lower extremity ecchymotic at anterior knee with corresponding tenderness, skin intact. DP pulse 2+ good capillary refill. Bilateral upper extremities without contusion abrasion or tenderness neurovascularly intact pelvis stable nontender. Entire spine nontender.  Neurological: She is alert and oriented to person, place, and time. No cranial nerve deficit. Coordination normal.  Gait normal motor strength 5 over 5 overall  Skin: Skin is warm and dry. No rash noted.  Psychiatric: She has a normal mood and affect.  Nursing note and vitals reviewed.    ED Treatments / Results  Labs (all labs ordered are  listed, but only abnormal results are displayed) Labs Reviewed - No data to display  EKG  EKG Interpretation None       Radiology No results found.  Procedures Procedures (including critical care time)  Medications Ordered in ED Medications - No data to display Declines pain medicine here Results for orders placed or performed during the hospital encounter of 04/28/15  Urine culture  Result Value Ref Range   Specimen Description URINE, CLEAN CATCH    Special Requests bactrim    Colony Count      30,000 COLONIES/ML Performed at [facility]    Culture      Multiple bacterial morphotypes present, none predominant. Suggest appropriate recollection if clinically indicated. Performed at [facility]    Report Status 04/30/2015 FINAL   CBC WITH DIFFERENTIAL  Result Value Ref Range   WBC 14.6 (H) 4.0 - 10.5 K/uL   RBC 3.32 (L) 3.87 - 5.11 MIL/uL   Hemoglobin 9.0 (L) 12.0 - 15.0 g/dL   HCT 28.4 (L) 36.0 - 46.0 %   MCV 85.5 78.0 - 100.0 fL   MCH 27.1 26.0 - 34.0 pg   MCHC 31.7 30.0 - 36.0 g/dL   RDW 14.3 11.5 - 15.5 %   Platelets 585 (H) 150 - 400 K/uL   Neutrophils Relative % 69 43 - 77 %   Lymphocytes Relative 17 12 - 46 %   Monocytes Relative 9 3 - 12 %   Eosinophils Relative 4 0 - 5 %   Basophils Relative 1 0 - 1 %   Neutro Abs 10.1 (H) 1.7 - 7.7 K/uL   Lymphs Abs 2.5 0.7 - 4.0 K/uL   Monocytes Absolute 1.3 (H) 0.1 - 1.0 K/uL   Eosinophils Absolute 0.6 0.0 - 0.7 K/uL   Basophils Absolute 0.1 0.0 - 0.1 K/uL   RBC Morphology POLYCHROMASIA PRESENT    WBC Morphology MILD LEFT SHIFT (1-5% METAS, OCC MYELO, OCC BANDS)    Smear Review PLATELETS APPEAR INCREASED   Comprehensive metabolic panel  Result Value Ref Range   Sodium 132 (L) 135 - 145 mmol/L   Potassium 4.1 3.5 - 5.1 mmol/L   Chloride 95 (L) 101 - 111 mmol/L   CO2 24 22 - 32 mmol/L   Glucose, Bld 145 (H) 70 - 99 mg/dL   BUN 13 6 - 20  mg/dL   Creatinine, Ser 0.80 0.44 - 1.00 mg/dL   Calcium  9.0 8.9 - 10.3 mg/dL   Total Protein 6.3 (L) 6.5 - 8.1 g/dL   Albumin 2.8 (L) 3.5 - 5.0 g/dL   AST 33 15 - 41 U/L   ALT 51 14 - 54 U/L   Alkaline Phosphatase 88 38 - 126 U/L   Total Bilirubin 0.6 0.3 - 1.2 mg/dL   GFR calc non Af Amer >60 >60 mL/min   GFR calc Af Amer >60 >60 mL/min   Anion gap 13 5 - 15  Creatinine, serum  Result Value Ref Range   Creatinine, Ser 0.82 0.44 - 1.00 mg/dL   GFR calc non Af Amer >60 >60 mL/min   GFR calc Af Amer >60 >60 mL/min   Dg Ribs Unilateral W/chest Left  Result Date: 06/04/2017 CLINICAL DATA:  Pain following fall EXAM: LEFT RIBS AND CHEST - 3+ VIEW COMPARISON:  Chest radiographs April 22, 2015 and Apr 26, 2015 FINDINGS: Frontal chest as well as oblique and cone-down rib images were obtained. There is minimal atelectatic change in the left base. There is no edema or consolidation. Heart size and pulmonary vascularity are normal. There is aortic atherosclerosis. No adenopathy. 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 There is no evident pneumothorax. There is a minimal pleural effusion on the left. There is evidence of an old healed fracture of the anterior left eleventh rib. No acute fracture is demonstrated on this study. IMPRESSION: No acute fracture evident. Old healed fracture anterior left eleventh rib. Minimal left base atelectasis with minimal left pleural effusion. No pneumothorax. No edema or consolidation. 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 Bone infarct could present similarly. This lesion is stable and has a benign appearance. There is aortic atherosclerosis. Electronically Signed   By: Lowella Grip III M.D.   On: 06/04/2017 10:42   Dg Knee Complete 4 Views Left  Result Date: 06/04/2017 CLINICAL DATA:  Pain following fall EXAM: LEFT KNEE - COMPLETE 4+ VIEW COMPARISON:  November 11, 2013 FINDINGS: Frontal, lateral, and  bilateral oblique views were obtained. There is no fracture or dislocation. No joint effusion. There is marked narrowing medially, slightly progressed from prior study. There is milder narrowing laterally and in the patellofemoral joint regions. There is spurring in all compartments, most notably medially. Bones appear somewhat osteoporotic. IMPRESSION: Osteoarthritic change, most marked medially. Bones appear somewhat osteoporotic. No fracture or dislocation. No evident joint effusion. Electronically Signed   By: Lowella Grip III M.D.   On: 06/04/2017 10:38   Dg Knee Complete 4 Views Right  Result Date: 06/04/2017 CLINICAL DATA:  Pain following fall EXAM: RIGHT KNEE - COMPLETE 4+ VIEW COMPARISON:  None. FINDINGS: Frontal, lateral, and bilateral oblique views were obtained. There is no evident fracture or dislocation. There is no appreciable joint effusion. There is moderate narrowing medially. There is slight spurring in all compartments. There is a more prominent spur along the anterior superior patella. No erosive change. IMPRESSION: No fracture or dislocation. No joint effusion. Moderate narrowing in the medial compartment is noted. No erosive change. Spur along the anterior superior patella most likely represents distal quadriceps tendinosis. Electronically Signed   By: Lowella Grip III M.D.   On: 06/04/2017 10:39   Initial Impression / Assessment and Plan / ED Course  I have reviewed the triage vital signs and the nursing notes.  Pertinent  labs & imaging results that were available during my care of the patient were reviewed by me and considered in my medical decision making (see chart for details).     X-rays viewed by me.  Patient declines prescription for pain medicine she will continue to take Tylenol as directed for pain. Plan blood pressure recheck 3 weeks  Final Clinical Impressions(s) / ED Diagnoses  Diagnosis #1 fall Final diagnoses:  None  #2 contusions to multiple  sites #3 elevated blood pressure  New Prescriptions New Prescriptions   No medications on file     Orlie Dakin, MD 06/04/17 1127

## 2017-06-04 NOTE — ED Triage Notes (Signed)
Pt reports fall 3 days , left ribs pain, sts pain only when coughing or breathing in. denies shortness of breath, also reports bilateral knee pain post fall.

## 2017-07-04 ENCOUNTER — Ambulatory Visit (INDEPENDENT_AMBULATORY_CARE_PROVIDER_SITE_OTHER): Payer: 59 | Admitting: Allergy and Immunology

## 2017-07-04 ENCOUNTER — Encounter: Payer: Self-pay | Admitting: Allergy and Immunology

## 2017-07-04 VITALS — BP 132/76 | HR 72 | Temp 98.2°F | Resp 16 | Ht 60.95 in | Wt 190.0 lb

## 2017-07-04 DIAGNOSIS — J3089 Other allergic rhinitis: Secondary | ICD-10-CM | POA: Diagnosis not present

## 2017-07-04 DIAGNOSIS — H1045 Other chronic allergic conjunctivitis: Secondary | ICD-10-CM

## 2017-07-04 DIAGNOSIS — Z91013 Allergy to seafood: Secondary | ICD-10-CM

## 2017-07-04 DIAGNOSIS — Z91018 Allergy to other foods: Secondary | ICD-10-CM | POA: Diagnosis not present

## 2017-07-04 DIAGNOSIS — Z91012 Allergy to eggs: Secondary | ICD-10-CM

## 2017-07-04 DIAGNOSIS — H1010 Acute atopic conjunctivitis, unspecified eye: Secondary | ICD-10-CM

## 2017-07-04 MED ORDER — EPINEPHRINE 0.3 MG/0.3ML IJ SOAJ: INTRAMUSCULAR | 3 refills | Status: DC

## 2017-07-04 MED ORDER — MONTELUKAST SODIUM 10 MG PO TABS: 10.0000 mg | ORAL_TABLET | Freq: Every day | ORAL | 1 refills | Status: DC

## 2017-07-04 NOTE — Patient Instructions (Addendum)
  1. Allergen avoidance measures  2. Treat and prevent inflammation:   A. Flonase - 1-2 sprays each nostril one time per day  B. montelukast 10 mg tablet - 1 time per day  3. If needed:    A. Nasal saline wash  B. Azelastine 1-2 sprays each nostril one-2 times per day  C. OTC antihistamine - Claritin/Zyrtec/Allegra  D. EpiPen, Benadryl, M.D./ER evaluation for allergic reaction  4. Consider a course of immunotherapy   5. Obtain fall flu vaccine   6. Blood - wheat, egg with reflex, soy, shellfish panel  7. Return to clinic 6 months or earlier if problem

## 2017-07-04 NOTE — Progress Notes (Signed)
Dear Dr. Joylene Draft,  Thank you for referring [name] to the [facility] on 07/04/2017.   Below is a summation of this patient's evaluation and recommendations.  Thank you for your referral. I will keep you informed about this patient's response to treatment.   If you have any questions please do not hesitate to contact me.   Sincerely,  Jiles Prows, MD Allergy / Immunology [facility]   ______________________________________________________________________    NEW PATIENT NOTE  Referring Provider: Crist Infante, MD Primary Provider: Crist Infante, MD Date of office visit: 07/04/2017    Subjective:   Chief Complaint:  [name] (DOB: 12/12/1951) is a 66 y.o. female who presents to the clinic on 07/04/2017 with a chief complaint of Nasal Congestion .     HPI: [name] presents to this clinic in evaluation of long-standing allergic disease. Apparently she has a history of allergic rhinoconjunctivitis manifested as nasal congestion and sneezing and itchy eyes for which she underwent a course of immunotherapy for 7 years while living in [place] and discontinued this form of therapy for 4-1/2 years since moving to the [place] area. She believes that this form of therapy did help her regarding her respiratory tract symptoms. Even in the face of using Flonase she still has a springtime and fall time flare. Provoking factors appear to be exposure to pollen.  Recently she had a prolonged febrile illness associated with sinusitis for which she underwent a course of Augmentin. Her husband had a similar issue around the same point in time and this appeared to coincide with flying back from [place] and being in an airplane for 3 hours.  [name] also appears to have an issue with food allergies. Apparently after eating eggs she develops very significant vomiting and possibly a rash. She also  develops a issue with peanuts giving rise to diarrhea and shrimp giving rise to diarrhea and soy products giving rise to diarrhea. She may have developed a slight eczematous reaction on her arms when eating wheat. Apparently she has been evaluated for celiac disease in the past. She avoids peanuts and eggs and shellfish and wheat and soy at this point in time.  Past Medical History:  Diagnosis Date  . Arthritis    Bilateral in the Knees and fingers (04/22/2015)  . Chickenpox   . Complication of anesthesia    "hard to wake up:  Marland Kitchen Environmental allergies   . Family history of adverse reaction to anesthesia    "sister & mom are hard to wake up:  . Food allergy    Peanuts, eggs, shrimp/shellfish, wheat, soy  . Genital warts   . Heart murmur   . HTN (hypertension)   . Hyperlipidemia   . Hypothyroidism   . Thyroid disease   . Thyroid nodule     Past Surgical History:  Procedure Laterality Date  . CARPAL TUNNEL RELEASE Bilateral 1990's  . CYSTECTOMY     From the cervix  . FRACTURE SURGERY    . INTRAMEDULLARY (IM) NAIL INTERTROCHANTERIC Left 12/15/2014   Procedure: INTRAMEDULLARY (IM) NAIL INTERTROCHANTRIC;  Surgeon: Augustin Schooling, MD;  Location: [facility];  Service: Orthopedics;  Laterality: Left;  . OTHER SURGICAL HISTORY Left 12/2016   Bone spur on foot  . TOTAL HIP ARTHROPLASTY Left 04/24/2015   Procedure:  TOTAL HIP ARTHROPLASTY AND REMOVAL OF IM AFFIXUS NAIL ;  Surgeon: Paralee Cancel, MD;  Location: [facility];  Service: Orthopedics;  Laterality: Left;    Allergies as of 07/04/2017      Reactions   Gluten Meal    Peanut-containing Drug Products Diarrhea   Soy Allergy    Statins Diarrhea   Eggs Or Egg-derived Products Diarrhea, Rash   Shrimp [shellfish Allergy] Rash      Medication List      acetaminophen 325 MG tablet Commonly known as:  TYLENOL Take 2 tablets (650 mg total) by mouth every 6 (six) hours as needed.   amoxicillin-clavulanate 875-125 MG tablet Commonly known  as:  AUGMENTIN TK 1 T PO BID FOR 10 DAYS   azelastine 0.1 % nasal spray Commonly known as:  ASTELIN Place 1 spray into both nostrils 2 (two) times daily. Use in each nostril as directed   diclofenac sodium 1 % Gel Commonly known as:  VOLTAREN Apply 2 g topically 4 (four) times daily.   Evolocumab 140 MG/ML Soaj Inject 140 mg into the skin every 14 (fourteen) days.   fexofenadine 180 MG tablet Commonly known as:  ALLEGRA Take 180 mg by mouth daily.   fluticasone 50 MCG/ACT nasal spray Commonly known as:  FLONASE Place 2 sprays into both nostrils daily.   levothyroxine 100 MCG tablet Commonly known as:  SYNTHROID, LEVOTHROID Take 100 mcg by mouth daily before breakfast.   valsartan 320 MG tablet Commonly known as:  DIOVAN TAKE 1 TABLET DAILY   Vitamin D (Ergocalciferol) 50000 units Caps capsule Commonly known as:  DRISDOL Take 50,000 Units by mouth 3 (three) times a week.       Review of systems negative except as noted in HPI / PMHx or noted below:  Review of Systems  Constitutional: Negative.   HENT: Negative.   Eyes: Negative.   Respiratory: Negative.   Cardiovascular: Negative.   Gastrointestinal: Negative.   Genitourinary: Negative.   Musculoskeletal: Negative.   Skin: Negative.   Neurological: Negative.   Endo/Heme/Allergies: Negative.   Psychiatric/Behavioral: Negative.     Family History  Problem Relation Age of Onset  . Arthritis Mother   . Hyperlipidemia Mother   . High blood pressure Mother   . Diabetes Mother   . Myasthenia gravis Mother   . Heart disease Father   . Stroke Father        x's 2  . High blood pressure Father   . Diabetes Maternal Grandmother   . Heart disease Paternal Grandmother   . Heart disease Paternal Grandfather     Social History   Social History  . Marital status: Married    Spouse name: N/A  . Number of children: N/A  . Years of education: N/A   Occupational History  . Not on file.   Social History Main  Topics  . Smoking status: Never Smoker  . Smokeless tobacco: Never Used  . Alcohol use Yes     Comment: 04/22/2015 "a gtlass of wine maybe a couple times/month"  . Drug use: No  . Sexual activity: Yes    Partners: Male   Other Topics Concern  . Not on file   Social History Narrative   Exercise-- walk-- 4-7 x a week    Environmental and Social history  Lives in a house with a dry environment, no animals located inside the household, hardwoods in the bedroom, no plastic on the bed, no plastic on the pillow, and no smokers located inside the household.  Objective:   Vitals:   07/04/17 0911  BP: 132/76  Pulse: 72  Resp:  16  Temp: 98.2 F (36.8 C)   Height: 5' 0.95" (154.8 cm) Weight: 190 lb (86.2 kg)  Physical Exam  Constitutional: She is well-developed, well-nourished, and in no distress.  HENT:  Head: Normocephalic. Head is without right periorbital erythema and without left periorbital erythema.  Right Ear: Tympanic membrane, external ear and ear canal normal.  Left Ear: Tympanic membrane, external ear and ear canal normal.  Nose: Nose normal. No mucosal edema or rhinorrhea.  Mouth/Throat: Uvula is midline, oropharynx is clear and moist and mucous membranes are normal. No oropharyngeal exudate.  Eyes: Conjunctivae and lids are normal. Pupils are equal, round, and reactive to light.  Neck: Trachea normal. No tracheal tenderness present. No tracheal deviation present. No thyromegaly present.  Cardiovascular: Normal rate, regular rhythm, S1 normal, S2 normal and normal heart sounds.   No murmur heard. Pulmonary/Chest: Effort normal and breath sounds normal. No stridor. No tachypnea. No respiratory distress. She has no wheezes. She has no rales. She exhibits no tenderness.  Abdominal: Soft. She exhibits no distension and no mass. There is no hepatosplenomegaly. There is no tenderness. There is no rebound and no guarding.  Musculoskeletal: She exhibits no edema or tenderness.    Lymphadenopathy:       Head (right side): No tonsillar adenopathy present.       Head (left side): No tonsillar adenopathy present.    She has no cervical adenopathy.    She has no axillary adenopathy.  Neurological: She is alert. Gait normal.  Skin: No rash noted. She is not diaphoretic. No erythema. No pallor. Nails show no clubbing.  Psychiatric: Mood and affect normal.    Diagnostics: Allergy skin tests were performed. She demonstrated hypersensitivity to house dust mite, weeds, and molds.  Assessment and Plan:    1. Other allergic rhinitis   2. Seasonal allergic conjunctivitis   3. Egg allergy   4. Shellfish allergy   5. Soy allergy   6. Wheat allergy     1. Allergen avoidance measures  2. Treat and prevent inflammation:   A. Flonase - 1-2 sprays each nostril one time per day  B. montelukast 10 mg tablet - 1 time per day  3. If needed:    A. Nasal saline wash  B. Azelastine 1-2 sprays each nostril one-2 times per day  C. OTC antihistamine - Claritin/Zyrtec/Allegra  D. EpiPen, Benadryl, M.D./ER evaluation for allergic reaction  4. Consider a course of immunotherapy   5. Obtain fall flu vaccine   6. Blood - wheat, egg with reflex, soy, shellfish panel  7. Return to clinic 6 months or earlier if problem   Sidonie has atopic disease affecting her respiratory tract and she will now perform allergen avoidance measures directed against specific aeroallergens and consistently use anti-inflammatory medications for her respiratory tract as noted above. She would definitely be a candidate for immunotherapy and I have given her literature on this form of treatment. Concerning her possible food allergy, we will check antigen specific IgE antibodies directed against these food products and make a decision about how to proceed based upon these results.  Jiles Prows, MD [facility]

## 2017-08-01 ENCOUNTER — Ambulatory Visit: Payer: 59 | Admitting: Allergy and Immunology

## 2017-10-27 DIAGNOSIS — G8929 Other chronic pain: Secondary | ICD-10-CM | POA: Diagnosis not present

## 2017-11-01 DIAGNOSIS — M7918 Myalgia, other site: Secondary | ICD-10-CM | POA: Diagnosis not present

## 2017-11-01 DIAGNOSIS — M5136 Other intervertebral disc degeneration, lumbar region: Secondary | ICD-10-CM | POA: Diagnosis not present

## 2017-11-01 DIAGNOSIS — M5416 Radiculopathy, lumbar region: Secondary | ICD-10-CM | POA: Diagnosis not present

## 2017-11-21 DIAGNOSIS — M5442 Lumbago with sciatica, left side: Secondary | ICD-10-CM | POA: Diagnosis not present

## 2017-11-21 DIAGNOSIS — M5441 Lumbago with sciatica, right side: Secondary | ICD-10-CM | POA: Diagnosis not present

## 2017-11-21 DIAGNOSIS — M5136 Other intervertebral disc degeneration, lumbar region: Secondary | ICD-10-CM | POA: Diagnosis not present

## 2017-11-23 DIAGNOSIS — M545 Low back pain: Secondary | ICD-10-CM | POA: Diagnosis not present

## 2017-11-23 DIAGNOSIS — G8929 Other chronic pain: Secondary | ICD-10-CM | POA: Diagnosis not present

## 2017-11-27 DIAGNOSIS — M545 Low back pain: Secondary | ICD-10-CM | POA: Diagnosis not present

## 2017-11-27 DIAGNOSIS — G8929 Other chronic pain: Secondary | ICD-10-CM | POA: Diagnosis not present

## 2017-12-01 DIAGNOSIS — G8929 Other chronic pain: Secondary | ICD-10-CM | POA: Diagnosis not present

## 2017-12-01 DIAGNOSIS — M545 Low back pain: Secondary | ICD-10-CM | POA: Diagnosis not present

## 2017-12-06 DIAGNOSIS — M545 Low back pain: Secondary | ICD-10-CM | POA: Diagnosis not present

## 2017-12-06 DIAGNOSIS — G8929 Other chronic pain: Secondary | ICD-10-CM | POA: Diagnosis not present

## 2017-12-08 DIAGNOSIS — M545 Low back pain: Secondary | ICD-10-CM | POA: Diagnosis not present

## 2017-12-08 DIAGNOSIS — G8929 Other chronic pain: Secondary | ICD-10-CM | POA: Diagnosis not present

## 2017-12-11 DIAGNOSIS — M545 Low back pain: Secondary | ICD-10-CM | POA: Diagnosis not present

## 2017-12-11 DIAGNOSIS — G8929 Other chronic pain: Secondary | ICD-10-CM | POA: Diagnosis not present

## 2017-12-14 DIAGNOSIS — M545 Low back pain: Secondary | ICD-10-CM | POA: Diagnosis not present

## 2017-12-14 DIAGNOSIS — G8929 Other chronic pain: Secondary | ICD-10-CM | POA: Diagnosis not present

## 2017-12-15 DIAGNOSIS — M5136 Other intervertebral disc degeneration, lumbar region: Secondary | ICD-10-CM | POA: Diagnosis not present

## 2017-12-15 DIAGNOSIS — M5416 Radiculopathy, lumbar region: Secondary | ICD-10-CM | POA: Diagnosis not present

## 2017-12-15 DIAGNOSIS — M7918 Myalgia, other site: Secondary | ICD-10-CM | POA: Diagnosis not present

## 2017-12-22 DIAGNOSIS — G8929 Other chronic pain: Secondary | ICD-10-CM | POA: Diagnosis not present

## 2017-12-22 DIAGNOSIS — M545 Low back pain: Secondary | ICD-10-CM | POA: Diagnosis not present

## 2017-12-27 DIAGNOSIS — M545 Low back pain: Secondary | ICD-10-CM | POA: Diagnosis not present

## 2017-12-29 DIAGNOSIS — M545 Low back pain: Secondary | ICD-10-CM | POA: Diagnosis not present

## 2018-01-08 DIAGNOSIS — R7301 Impaired fasting glucose: Secondary | ICD-10-CM | POA: Diagnosis not present

## 2018-01-08 DIAGNOSIS — I10 Essential (primary) hypertension: Secondary | ICD-10-CM | POA: Diagnosis not present

## 2018-01-08 DIAGNOSIS — E041 Nontoxic single thyroid nodule: Secondary | ICD-10-CM | POA: Diagnosis not present

## 2018-01-08 DIAGNOSIS — K900 Celiac disease: Secondary | ICD-10-CM | POA: Diagnosis not present

## 2018-01-08 DIAGNOSIS — E559 Vitamin D deficiency, unspecified: Secondary | ICD-10-CM | POA: Diagnosis not present

## 2018-01-10 DIAGNOSIS — M47816 Spondylosis without myelopathy or radiculopathy, lumbar region: Secondary | ICD-10-CM | POA: Diagnosis not present

## 2018-01-15 DIAGNOSIS — E559 Vitamin D deficiency, unspecified: Secondary | ICD-10-CM | POA: Diagnosis not present

## 2018-01-15 DIAGNOSIS — M810 Age-related osteoporosis without current pathological fracture: Secondary | ICD-10-CM | POA: Diagnosis not present

## 2018-01-15 DIAGNOSIS — R1319 Other dysphagia: Secondary | ICD-10-CM | POA: Diagnosis not present

## 2018-01-15 DIAGNOSIS — Z6835 Body mass index (BMI) 35.0-35.9, adult: Secondary | ICD-10-CM | POA: Diagnosis not present

## 2018-01-15 DIAGNOSIS — K900 Celiac disease: Secondary | ICD-10-CM | POA: Diagnosis not present

## 2018-01-15 DIAGNOSIS — E041 Nontoxic single thyroid nodule: Secondary | ICD-10-CM | POA: Diagnosis not present

## 2018-01-15 DIAGNOSIS — I10 Essential (primary) hypertension: Secondary | ICD-10-CM | POA: Diagnosis not present

## 2018-01-15 DIAGNOSIS — R350 Frequency of micturition: Secondary | ICD-10-CM | POA: Diagnosis not present

## 2018-01-15 DIAGNOSIS — Z0000 Encounter for general adult medical examination without abnormal findings: Secondary | ICD-10-CM | POA: Diagnosis not present

## 2018-01-15 DIAGNOSIS — J3089 Other allergic rhinitis: Secondary | ICD-10-CM | POA: Diagnosis not present

## 2018-01-15 DIAGNOSIS — R7301 Impaired fasting glucose: Secondary | ICD-10-CM | POA: Diagnosis not present

## 2018-01-15 DIAGNOSIS — Z1389 Encounter for screening for other disorder: Secondary | ICD-10-CM | POA: Diagnosis not present

## 2018-01-16 ENCOUNTER — Other Ambulatory Visit: Payer: Self-pay | Admitting: Internal Medicine

## 2018-01-16 ENCOUNTER — Other Ambulatory Visit (HOSPITAL_COMMUNITY): Payer: Self-pay | Admitting: Diagnostic Radiology

## 2018-01-16 DIAGNOSIS — Z1231 Encounter for screening mammogram for malignant neoplasm of breast: Secondary | ICD-10-CM

## 2018-01-23 DIAGNOSIS — E785 Hyperlipidemia, unspecified: Secondary | ICD-10-CM | POA: Diagnosis not present

## 2018-01-23 DIAGNOSIS — M47816 Spondylosis without myelopathy or radiculopathy, lumbar region: Secondary | ICD-10-CM | POA: Diagnosis not present

## 2018-01-23 DIAGNOSIS — R7303 Prediabetes: Secondary | ICD-10-CM | POA: Diagnosis not present

## 2018-01-23 DIAGNOSIS — E039 Hypothyroidism, unspecified: Secondary | ICD-10-CM | POA: Diagnosis not present

## 2018-01-23 DIAGNOSIS — E041 Nontoxic single thyroid nodule: Secondary | ICD-10-CM | POA: Diagnosis not present

## 2018-01-26 DIAGNOSIS — Z1212 Encounter for screening for malignant neoplasm of rectum: Secondary | ICD-10-CM | POA: Diagnosis not present

## 2018-01-30 ENCOUNTER — Other Ambulatory Visit (HOSPITAL_COMMUNITY): Payer: Self-pay | Admitting: *Deleted

## 2018-01-31 ENCOUNTER — Ambulatory Visit (HOSPITAL_COMMUNITY)
Admission: RE | Admit: 2018-01-31 | Discharge: 2018-01-31 | Disposition: A | Discharge status: Home / Self Care | Payer: Medicare Other | Source: Ambulatory Visit | Attending: Internal Medicine | Admitting: Internal Medicine

## 2018-01-31 DIAGNOSIS — M810 Age-related osteoporosis without current pathological fracture: Secondary | ICD-10-CM | POA: Diagnosis not present

## 2018-01-31 MED ORDER — DENOSUMAB 60 MG/ML ~~LOC~~ SOLN
60.0000 mg | Freq: Once | SUBCUTANEOUS | Status: AC
  Administered 2018-01-31: 60 mg via SUBCUTANEOUS
  Filled 2018-01-31: qty 1

## 2018-02-05 ENCOUNTER — Ambulatory Visit
Admission: RE | Admit: 2018-02-05 | Discharge: 2018-02-05 | Disposition: A | Discharge status: Home / Self Care | Payer: Medicare Other | Source: Ambulatory Visit | Attending: Internal Medicine | Admitting: Internal Medicine

## 2018-02-05 DIAGNOSIS — Z1231 Encounter for screening mammogram for malignant neoplasm of breast: Secondary | ICD-10-CM

## 2018-02-06 DIAGNOSIS — M47816 Spondylosis without myelopathy or radiculopathy, lumbar region: Secondary | ICD-10-CM | POA: Diagnosis not present

## 2018-02-22 DIAGNOSIS — R350 Frequency of micturition: Secondary | ICD-10-CM | POA: Diagnosis not present

## 2018-02-22 DIAGNOSIS — N390 Urinary tract infection, site not specified: Secondary | ICD-10-CM | POA: Diagnosis not present

## 2018-02-24 ENCOUNTER — Emergency Department (HOSPITAL_COMMUNITY)
Admission: EM | Admit: 2018-02-24 | Discharge: 2018-02-24 | Disposition: A | Discharge status: Home / Self Care | Payer: Medicare Other | Attending: Emergency Medicine | Admitting: Emergency Medicine

## 2018-02-24 ENCOUNTER — Encounter (HOSPITAL_COMMUNITY): Payer: Self-pay | Admitting: Emergency Medicine

## 2018-02-24 ENCOUNTER — Other Ambulatory Visit: Payer: Self-pay

## 2018-02-24 ENCOUNTER — Emergency Department (HOSPITAL_COMMUNITY): Payer: Medicare Other

## 2018-02-24 DIAGNOSIS — E039 Hypothyroidism, unspecified: Secondary | ICD-10-CM | POA: Diagnosis not present

## 2018-02-24 DIAGNOSIS — Z79899 Other long term (current) drug therapy: Secondary | ICD-10-CM | POA: Diagnosis not present

## 2018-02-24 DIAGNOSIS — I10 Essential (primary) hypertension: Secondary | ICD-10-CM

## 2018-02-24 DIAGNOSIS — R030 Elevated blood-pressure reading, without diagnosis of hypertension: Secondary | ICD-10-CM | POA: Diagnosis not present

## 2018-02-24 DIAGNOSIS — Z96642 Presence of left artificial hip joint: Secondary | ICD-10-CM | POA: Diagnosis not present

## 2018-02-24 LAB — CBC
HCT: 40.1 % (ref 36.0–46.0)
Hemoglobin: 13.3 g/dL (ref 12.0–15.0)
MCH: 29.3 pg (ref 26.0–34.0)
MCHC: 33.2 g/dL (ref 30.0–36.0)
MCV: 88.3 fL (ref 78.0–100.0)
PLATELETS: 276 10*3/uL (ref 150–400)
RBC: 4.54 MIL/uL (ref 3.87–5.11)
RDW: 13.6 % (ref 11.5–15.5)
WBC: 6.6 10*3/uL (ref 4.0–10.5)

## 2018-02-24 LAB — URINALYSIS, ROUTINE W REFLEX MICROSCOPIC
Bilirubin Urine: NEGATIVE
Glucose, UA: NEGATIVE mg/dL
Ketones, ur: NEGATIVE mg/dL
Leukocytes, UA: NEGATIVE
NITRITE: NEGATIVE
PROTEIN: NEGATIVE mg/dL
SPECIFIC GRAVITY, URINE: 1.018 (ref 1.005–1.030)
pH: 5 (ref 5.0–8.0)

## 2018-02-24 LAB — I-STAT TROPONIN, ED: TROPONIN I, POC: 0 ng/mL (ref 0.00–0.08)

## 2018-02-24 LAB — BASIC METABOLIC PANEL
Anion gap: 9 (ref 5–15)
BUN: 15 mg/dL (ref 6–20)
CALCIUM: 9.4 mg/dL (ref 8.9–10.3)
CHLORIDE: 105 mmol/L (ref 101–111)
CO2: 25 mmol/L (ref 22–32)
CREATININE: 0.69 mg/dL (ref 0.44–1.00)
GFR calc non Af Amer: >60 mL/min (ref >60)
Glucose, Bld: 112 mg/dL — ABNORMAL HIGH (ref 65–99)
Potassium: 4.1 mmol/L (ref 3.5–5.1)
Sodium: 139 mmol/L (ref 135–145)

## 2018-02-24 NOTE — ED Triage Notes (Addendum)
Pt c/o hypertension -- had been on Valsarten, switched to a different "sartin" in January- has been notifying private MD of BPs - following for 2 months. This am when pt took BP this am it was over 200/over 100 per pt.  Pt took BP med last night.  Pt states she saw dr yesterday -- with c/o urinary frequency. Was treated for 5 days w Bactrim beginning of February for same.

## 2018-02-24 NOTE — ED Notes (Signed)
Patient transported to X-ray 

## 2018-02-24 NOTE — ED Provider Notes (Signed)
[facility] EMERGENCY DEPARTMENT Provider Note   CSN: [id number] Arrival date & time: 02/24/18  1229     History   Chief Complaint Chief Complaint  Patient presents with  . Hypertension    HPI [name] is a 67 y.o. female.  Patient is a 67 year old female who presents with elevated blood pressure.  She states she recently changed blood pressure medicines in January.  She was taking valsartan.  She is now taking talmistartan.  She states her blood pressure has not been as well-controlled since that time.  She states today she just decided to take her blood pressure noted to be 200/100 on 2 different readings.  She denies any symptoms with it.  No chest pain or shortness of breath.  No headache.  No dizziness.  No vision changes.  No gait disturbances.  No numbness or weakness to her extremities.  No speech deficits.  She was concerned because it was very elevated and decided to come in to get checked out.        Past Medical History:  Diagnosis Date  . Arthritis    Bilateral in the Knees and fingers (04/22/2015)  . Chickenpox   . Complication of anesthesia    "hard to wake up:  Marland Kitchen Environmental allergies   . Family history of adverse reaction to anesthesia    "sister & mom are hard to wake up:  . Food allergy    Peanuts, eggs, shrimp/shellfish, wheat, soy  . Genital warts   . Heart murmur   . HTN (hypertension)   . Hyperlipidemia   . Hypothyroidism   . Thyroid disease   . Thyroid nodule     Patient Active Problem List   Diagnosis Date Noted  . Gluten intolerance 04/27/2015  . Postoperative anemia due to acute blood loss 04/27/2015  . Closed hip fracture ([place]) 04/22/2015  . Leg edema, left 04/22/2015  . Acute blood loss anemia 12/23/2014  . Fracture, intertrochanteric, left femur ([place]) 12/18/2014  . Fall   . Subtrochanteric fracture of femur ([place]) 12/16/2014  . UTI (urinary tract infection) 12/16/2014  . Leukocytosis 12/16/2014  . Hip  fracture requiring operative repair ([place])   . Closed left subtrochanteric femur fracture ([place]) 12/15/2014  . Lesion of right humerus 12/15/2014  . Osteoporosis, unspecified 10/15/2013  . HTN (hypertension) 07/16/2013  . Hypothyroidism 07/16/2013  . Osteopenia 07/16/2013  . Environmental allergies 07/16/2013  . Obesity (BMI 30-39.9) 07/16/2013    Past Surgical History:  Procedure Laterality Date  . CARPAL TUNNEL RELEASE Bilateral 1990's  . CYSTECTOMY     From the cervix  . FRACTURE SURGERY    . INTRAMEDULLARY (IM) NAIL INTERTROCHANTERIC Left 12/15/2014   Procedure: INTRAMEDULLARY (IM) NAIL INTERTROCHANTRIC;  Surgeon: Augustin Schooling, MD;  Location: [facility];  Service: Orthopedics;  Laterality: Left;  . OTHER SURGICAL HISTORY Left 12/2016   Bone spur on foot  . TOTAL HIP ARTHROPLASTY Left 04/24/2015   Procedure:  TOTAL HIP ARTHROPLASTY AND REMOVAL OF IM AFFIXUS NAIL ;  Surgeon: Paralee Cancel, MD;  Location: [facility];  Service: Orthopedics;  Laterality: Left;    OB History    No data available       Home Medications    Prior to Admission medications   Medication Sig Start Date End Date Taking? Authorizing Provider  acetaminophen (TYLENOL) 325 MG tablet Take 2 tablets (650 mg total) by mouth every 6 (six) hours as needed. 12/16/14   Netta Cedars, MD  Alirocumab (Zeb)  75 MG/ML SOPN Inject 75 Units into the skin every 14 (fourteen) days.    [provider]  amoxicillin-clavulanate (AUGMENTIN) 875-125 MG tablet TK 1 T PO BID FOR 10 DAYS 06/26/17   [provider]  azelastine (ASTELIN) 0.1 % nasal spray Place 1 spray into both nostrils 2 (two) times daily. Use in each nostril as directed 08/04/14   Carollee Herter, Kendrick Fries R, DO  diclofenac sodium (VOLTAREN) 1 % GEL Apply 2 g topically 4 (four) times daily. 05/04/15   Angiulli, Lavon Paganini, PA-C  EPINEPHrine 0.3 mg/0.3 mL IJ SOAJ injection Use as directed for life-threatening allergic reaction. 07/04/17   Kozlow, Donnamarie Poag, MD    Evolocumab 140 MG/ML SOAJ Inject 140 mg into the skin every 14 (fourteen) days. 05/01/17   [provider]  fexofenadine (ALLEGRA) 180 MG tablet Take 180 mg by mouth daily.    [provider]  fluticasone (FLONASE) 50 MCG/ACT nasal spray Place 2 sprays into both nostrils daily. 08/04/14   Ann Held, DO  levothyroxine (SYNTHROID, LEVOTHROID) 100 MCG tablet Take 100 mcg by mouth daily before breakfast.    [provider]  montelukast (SINGULAIR) 10 MG tablet Take 1 tablet (10 mg total) by mouth at bedtime. 07/04/17   Kozlow, Donnamarie Poag, MD  telmisartan (MICARDIS) 40 MG tablet Take 40 mg by mouth daily.    [provider]  valsartan (DIOVAN) 320 MG tablet TAKE 1 TABLET DAILY 08/04/14   Carollee Herter, Alferd Apa, DO  Vitamin D, Ergocalciferol, (DRISDOL) 50000 UNITS CAPS capsule Take 50,000 Units by mouth 3 (three) times a week.    [provider]    Family History Family History  Problem Relation Age of Onset  . Arthritis Mother   . Hyperlipidemia Mother   . High blood pressure Mother   . Diabetes Mother   . Myasthenia gravis Mother   . Heart disease Father   . Stroke Father        x's 2  . High blood pressure Father   . Diabetes Maternal Grandmother   . Heart disease Paternal Grandmother   . Heart disease Paternal Grandfather     Social History Social History   Tobacco Use  . Smoking status: Never Smoker  . Smokeless tobacco: Never Used  Substance Use Topics  . Alcohol use: Yes    Comment: 04/22/2015 "a gtlass of wine maybe a couple times/month"  . Drug use: No     Allergies   Gluten meal; Peanut-containing drug products; Soy allergy; Statins; Eggs or egg-derived products; and Shrimp [shellfish allergy]   Review of Systems Review of Systems  Constitutional: Negative for chills, diaphoresis, fatigue and fever.  HENT: Negative for congestion, rhinorrhea and sneezing.   Eyes: Negative.   Respiratory: Negative for cough, chest  tightness and shortness of breath.   Cardiovascular: Negative for chest pain and leg swelling.  Gastrointestinal: Negative for abdominal pain, blood in stool, diarrhea, nausea and vomiting.  Genitourinary: Negative for difficulty urinating, flank pain, frequency and hematuria.  Musculoskeletal: Negative for arthralgias and back pain.  Skin: Negative for rash.  Neurological: Negative for dizziness, speech difficulty, weakness, numbness and headaches.     Physical Exam Updated Vital Signs BP 130/79   Pulse 67   Temp 99.3 F (37.4 C) (Oral)   Resp 18   Ht 5\' 2"  (1.575 m)   Wt 80.3 kg (177 lb)   SpO2 98%   BMI 32.37 kg/m   Physical Exam  Constitutional: She is  oriented to person, place, and time. She appears well-developed and well-nourished.  HENT:  Head: Normocephalic and atraumatic.  Eyes: Pupils are equal, round, and reactive to light.  Neck: Normal range of motion. Neck supple.  Cardiovascular: Normal rate, regular rhythm and normal heart sounds.  Pulmonary/Chest: Effort normal and breath sounds normal. No respiratory distress. She has no wheezes. She has no rales. She exhibits no tenderness.  Abdominal: Soft. Bowel sounds are normal. There is no tenderness. There is no rebound and no guarding.  Musculoskeletal: Normal range of motion. She exhibits no edema.  Lymphadenopathy:    She has no cervical adenopathy.  Neurological: She is alert and oriented to person, place, and time.  Motor 5/5 all extremities Sensation grossly intact to LT all extremities Finger to Nose intact, no pronator drift CN II-XII grossly intact    Skin: Skin is warm and dry. No rash noted.  Psychiatric: She has a normal mood and affect.     ED Treatments / Results  Labs (all labs ordered are listed, but only abnormal results are displayed) Labs Reviewed  BASIC METABOLIC PANEL - Abnormal; Notable for the following components:      Result Value   Glucose, Bld 112 (*)    All other components  within normal limits  URINALYSIS, ROUTINE W REFLEX MICROSCOPIC - Abnormal; Notable for the following components:   Hgb urine dipstick SMALL (*)    Bacteria, UA RARE (*)    Squamous Epithelial / LPF 0-5 (*)    All other components within normal limits  CBC  I-STAT TROPONIN, ED    EKG  EKG Interpretation  Date/Time:  Saturday February 24 2018 12:52:12 EST Ventricular Rate:  71 PR Interval:  170 QRS Duration: 88 QT Interval:  388 QTC Calculation: 421 R Axis:   17 Text Interpretation:  Normal sinus rhythm Possible Anterior infarct , age undetermined Abnormal ECG since last tracing no significant change Confirmed by Malvin Johns [phone]) on 02/24/2018 1:28:19 PM       Radiology Dg Chest 2 View  Result Date: 02/24/2018 CLINICAL DATA:  Pt came to ED after taking bp reading which was 204/100. Pt has a hx of high bp. Pt recently changed bp med in January, bp has been elevated ever since. EXAM: CHEST  2 VIEW COMPARISON:  06/04/2017 and older exams. FINDINGS: Cardiac silhouette is normal in size and configuration. No mediastinal or hilar masses. No evidence of adenopathy. Clear lungs.  No pleural effusion or pneumothorax. Skeletal structures are demineralized. There is a mixed sclerotic and lucent lesion in the proximal right humerus which has been stable for multiple years. IMPRESSION: No active cardiopulmonary disease. Electronically Signed   By: Lajean Manes M.D.   On: 02/24/2018 13:55    Procedures Procedures (including critical care time)  Medications Ordered in ED Medications - No data to display   Initial Impression / Assessment and Plan / ED Course  I have reviewed the triage vital signs and the nursing notes.  Pertinent labs & imaging results that were available during my care of the patient were reviewed by me and considered in my medical decision making (see chart for details).     Patient is a 67 year old female who presents with elevated blood pressure.  She is  asymptomatic.  Her EKG does not show any ischemic changes.  Her creatinine is normal.  She had labs ordered in triage which appeared normal.  Her chest x-ray does not show any acute abnormalities.  Her blood pressure has  improved without treatment in the emergency department.  Her discharge blood pressure is 130/79.  She was discharged home in good condition.  She was encouraged to follow-up with her PCP.  Return precautions were given.  Final Clinical Impressions(s) / ED Diagnoses   Final diagnoses:  Essential hypertension    ED Discharge Orders    None       Malvin Johns, MD 02/24/18 1451

## 2018-03-26 DIAGNOSIS — F4323 Adjustment disorder with mixed anxiety and depressed mood: Secondary | ICD-10-CM | POA: Diagnosis not present

## 2018-04-02 DIAGNOSIS — F4323 Adjustment disorder with mixed anxiety and depressed mood: Secondary | ICD-10-CM | POA: Diagnosis not present

## 2018-04-09 DIAGNOSIS — F4323 Adjustment disorder with mixed anxiety and depressed mood: Secondary | ICD-10-CM | POA: Diagnosis not present

## 2018-04-11 DIAGNOSIS — M25552 Pain in left hip: Secondary | ICD-10-CM | POA: Diagnosis not present

## 2018-04-11 DIAGNOSIS — M25562 Pain in left knee: Secondary | ICD-10-CM | POA: Diagnosis not present

## 2018-04-11 DIAGNOSIS — M1612 Unilateral primary osteoarthritis, left hip: Secondary | ICD-10-CM | POA: Diagnosis not present

## 2018-04-16 DIAGNOSIS — F4323 Adjustment disorder with mixed anxiety and depressed mood: Secondary | ICD-10-CM | POA: Diagnosis not present

## 2018-04-23 DIAGNOSIS — F4323 Adjustment disorder with mixed anxiety and depressed mood: Secondary | ICD-10-CM | POA: Diagnosis not present

## 2018-04-30 DIAGNOSIS — F4323 Adjustment disorder with mixed anxiety and depressed mood: Secondary | ICD-10-CM | POA: Diagnosis not present

## 2018-05-07 DIAGNOSIS — F4323 Adjustment disorder with mixed anxiety and depressed mood: Secondary | ICD-10-CM | POA: Diagnosis not present

## 2018-05-12 DIAGNOSIS — M47816 Spondylosis without myelopathy or radiculopathy, lumbar region: Secondary | ICD-10-CM | POA: Diagnosis not present

## 2018-05-14 DIAGNOSIS — F4323 Adjustment disorder with mixed anxiety and depressed mood: Secondary | ICD-10-CM | POA: Diagnosis not present

## 2018-05-28 DIAGNOSIS — F4323 Adjustment disorder with mixed anxiety and depressed mood: Secondary | ICD-10-CM | POA: Diagnosis not present

## 2018-06-04 DIAGNOSIS — F4323 Adjustment disorder with mixed anxiety and depressed mood: Secondary | ICD-10-CM | POA: Diagnosis not present

## 2018-06-11 DIAGNOSIS — F4323 Adjustment disorder with mixed anxiety and depressed mood: Secondary | ICD-10-CM | POA: Diagnosis not present

## 2018-06-18 DIAGNOSIS — F4323 Adjustment disorder with mixed anxiety and depressed mood: Secondary | ICD-10-CM | POA: Diagnosis not present

## 2018-06-25 DIAGNOSIS — F4323 Adjustment disorder with mixed anxiety and depressed mood: Secondary | ICD-10-CM | POA: Diagnosis not present

## 2018-07-02 DIAGNOSIS — F4323 Adjustment disorder with mixed anxiety and depressed mood: Secondary | ICD-10-CM | POA: Diagnosis not present

## 2018-07-10 DIAGNOSIS — F4323 Adjustment disorder with mixed anxiety and depressed mood: Secondary | ICD-10-CM | POA: Diagnosis not present

## 2018-07-16 DIAGNOSIS — F4323 Adjustment disorder with mixed anxiety and depressed mood: Secondary | ICD-10-CM | POA: Diagnosis not present

## 2018-07-20 DIAGNOSIS — R7301 Impaired fasting glucose: Secondary | ICD-10-CM | POA: Diagnosis not present

## 2018-07-20 DIAGNOSIS — E7849 Other hyperlipidemia: Secondary | ICD-10-CM | POA: Diagnosis not present

## 2018-07-20 DIAGNOSIS — E038 Other specified hypothyroidism: Secondary | ICD-10-CM | POA: Diagnosis not present

## 2018-07-20 DIAGNOSIS — I10 Essential (primary) hypertension: Secondary | ICD-10-CM | POA: Diagnosis not present

## 2018-07-20 DIAGNOSIS — M810 Age-related osteoporosis without current pathological fracture: Secondary | ICD-10-CM | POA: Diagnosis not present

## 2018-07-20 DIAGNOSIS — Z6835 Body mass index (BMI) 35.0-35.9, adult: Secondary | ICD-10-CM | POA: Diagnosis not present

## 2018-07-23 DIAGNOSIS — F4323 Adjustment disorder with mixed anxiety and depressed mood: Secondary | ICD-10-CM | POA: Diagnosis not present

## 2018-07-24 DIAGNOSIS — E039 Hypothyroidism, unspecified: Secondary | ICD-10-CM | POA: Diagnosis not present

## 2018-07-24 DIAGNOSIS — E7801 Familial hypercholesterolemia: Secondary | ICD-10-CM | POA: Diagnosis not present

## 2018-07-26 DIAGNOSIS — M47816 Spondylosis without myelopathy or radiculopathy, lumbar region: Secondary | ICD-10-CM | POA: Diagnosis not present

## 2018-07-31 DIAGNOSIS — F4323 Adjustment disorder with mixed anxiety and depressed mood: Secondary | ICD-10-CM | POA: Diagnosis not present

## 2018-08-02 ENCOUNTER — Encounter (HOSPITAL_COMMUNITY): Payer: Medicare Other

## 2018-08-21 ENCOUNTER — Other Ambulatory Visit (HOSPITAL_COMMUNITY): Payer: Self-pay

## 2018-08-22 ENCOUNTER — Ambulatory Visit (HOSPITAL_COMMUNITY)
Admission: RE | Admit: 2018-08-22 | Discharge: 2018-08-22 | Disposition: A | Discharge status: Home / Self Care | Payer: Medicare Other | Source: Ambulatory Visit | Attending: Internal Medicine | Admitting: Internal Medicine

## 2018-08-22 DIAGNOSIS — M810 Age-related osteoporosis without current pathological fracture: Secondary | ICD-10-CM | POA: Diagnosis not present

## 2018-08-22 MED ORDER — DENOSUMAB 60 MG/ML ~~LOC~~ SOSY
PREFILLED_SYRINGE | SUBCUTANEOUS | Status: AC
  Administered 2018-08-22: 14:00:00 60 mg
  Filled 2018-08-22: qty 1

## 2018-08-22 MED ORDER — DENOSUMAB 60 MG/ML ~~LOC~~ SOSY
60.0000 mg | PREFILLED_SYRINGE | Freq: Once | SUBCUTANEOUS | Status: DC
Start: 2018-08-22 — End: 2018-08-23

## 2018-10-30 DIAGNOSIS — M47816 Spondylosis without myelopathy or radiculopathy, lumbar region: Secondary | ICD-10-CM | POA: Diagnosis not present

## 2018-11-30 DIAGNOSIS — L821 Other seborrheic keratosis: Secondary | ICD-10-CM | POA: Diagnosis not present

## 2018-11-30 DIAGNOSIS — L820 Inflamed seborrheic keratosis: Secondary | ICD-10-CM | POA: Diagnosis not present

## 2018-12-27 ENCOUNTER — Other Ambulatory Visit: Payer: Self-pay | Admitting: Internal Medicine

## 2018-12-27 DIAGNOSIS — Z1231 Encounter for screening mammogram for malignant neoplasm of breast: Secondary | ICD-10-CM

## 2019-01-10 ENCOUNTER — Encounter (HOSPITAL_COMMUNITY): Payer: Medicare Other

## 2019-01-17 DIAGNOSIS — M47816 Spondylosis without myelopathy or radiculopathy, lumbar region: Secondary | ICD-10-CM | POA: Diagnosis not present

## 2019-01-29 DIAGNOSIS — R7303 Prediabetes: Secondary | ICD-10-CM | POA: Diagnosis not present

## 2019-01-29 DIAGNOSIS — M810 Age-related osteoporosis without current pathological fracture: Secondary | ICD-10-CM | POA: Diagnosis not present

## 2019-01-29 DIAGNOSIS — E041 Nontoxic single thyroid nodule: Secondary | ICD-10-CM | POA: Diagnosis not present

## 2019-01-29 DIAGNOSIS — E039 Hypothyroidism, unspecified: Secondary | ICD-10-CM | POA: Diagnosis not present

## 2019-01-29 DIAGNOSIS — E7801 Familial hypercholesterolemia: Secondary | ICD-10-CM | POA: Diagnosis not present

## 2019-02-11 ENCOUNTER — Other Ambulatory Visit (HOSPITAL_COMMUNITY): Payer: Self-pay | Admitting: *Deleted

## 2019-02-12 ENCOUNTER — Ambulatory Visit (HOSPITAL_COMMUNITY)
Admission: RE | Admit: 2019-02-12 | Discharge: 2019-02-12 | Disposition: A | Discharge status: Home / Self Care | Payer: Medicare Other | Source: Ambulatory Visit | Attending: Internal Medicine | Admitting: Internal Medicine

## 2019-02-12 DIAGNOSIS — I10 Essential (primary) hypertension: Secondary | ICD-10-CM | POA: Diagnosis not present

## 2019-02-12 DIAGNOSIS — E7849 Other hyperlipidemia: Secondary | ICD-10-CM | POA: Diagnosis not present

## 2019-02-12 DIAGNOSIS — R82998 Other abnormal findings in urine: Secondary | ICD-10-CM | POA: Diagnosis not present

## 2019-02-12 DIAGNOSIS — M810 Age-related osteoporosis without current pathological fracture: Secondary | ICD-10-CM | POA: Diagnosis not present

## 2019-02-12 MED ORDER — DENOSUMAB 60 MG/ML ~~LOC~~ SOSY: 60.0000 mg | PREFILLED_SYRINGE | Freq: Once | SUBCUTANEOUS | Status: DC

## 2019-02-12 MED ORDER — DENOSUMAB 60 MG/ML ~~LOC~~ SOSY
PREFILLED_SYRINGE | SUBCUTANEOUS | Status: AC
  Administered 2019-02-12: 60 mg
  Filled 2019-02-12: qty 1

## 2019-02-13 ENCOUNTER — Ambulatory Visit
Admission: RE | Admit: 2019-02-13 | Discharge: 2019-02-13 | Disposition: A | Discharge status: Home / Self Care | Payer: Medicare Other | Source: Ambulatory Visit | Attending: Internal Medicine | Admitting: Internal Medicine

## 2019-02-13 DIAGNOSIS — Z1231 Encounter for screening mammogram for malignant neoplasm of breast: Secondary | ICD-10-CM | POA: Diagnosis not present

## 2019-02-15 ENCOUNTER — Other Ambulatory Visit: Payer: Self-pay | Admitting: Internal Medicine

## 2019-02-15 DIAGNOSIS — H33301 Unspecified retinal break, right eye: Secondary | ICD-10-CM | POA: Diagnosis not present

## 2019-02-15 DIAGNOSIS — H33312 Horseshoe tear of retina without detachment, left eye: Secondary | ICD-10-CM | POA: Diagnosis not present

## 2019-02-15 DIAGNOSIS — H43813 Vitreous degeneration, bilateral: Secondary | ICD-10-CM | POA: Diagnosis not present

## 2019-02-15 DIAGNOSIS — R928 Other abnormal and inconclusive findings on diagnostic imaging of breast: Secondary | ICD-10-CM

## 2019-02-15 DIAGNOSIS — H2513 Age-related nuclear cataract, bilateral: Secondary | ICD-10-CM | POA: Diagnosis not present

## 2019-02-19 DIAGNOSIS — E559 Vitamin D deficiency, unspecified: Secondary | ICD-10-CM | POA: Diagnosis not present

## 2019-02-19 DIAGNOSIS — Z1212 Encounter for screening for malignant neoplasm of rectum: Secondary | ICD-10-CM | POA: Diagnosis not present

## 2019-02-19 DIAGNOSIS — K900 Celiac disease: Secondary | ICD-10-CM | POA: Diagnosis not present

## 2019-02-19 DIAGNOSIS — E7849 Other hyperlipidemia: Secondary | ICD-10-CM | POA: Diagnosis not present

## 2019-02-19 DIAGNOSIS — Z0000 Encounter for general adult medical examination without abnormal findings: Secondary | ICD-10-CM | POA: Diagnosis not present

## 2019-02-19 DIAGNOSIS — Z6834 Body mass index (BMI) 34.0-34.9, adult: Secondary | ICD-10-CM | POA: Diagnosis not present

## 2019-02-19 DIAGNOSIS — R7301 Impaired fasting glucose: Secondary | ICD-10-CM | POA: Diagnosis not present

## 2019-02-19 DIAGNOSIS — I10 Essential (primary) hypertension: Secondary | ICD-10-CM | POA: Diagnosis not present

## 2019-02-19 DIAGNOSIS — Z1339 Encounter for screening examination for other mental health and behavioral disorders: Secondary | ICD-10-CM | POA: Diagnosis not present

## 2019-02-19 DIAGNOSIS — J309 Allergic rhinitis, unspecified: Secondary | ICD-10-CM | POA: Diagnosis not present

## 2019-02-19 DIAGNOSIS — Z1331 Encounter for screening for depression: Secondary | ICD-10-CM | POA: Diagnosis not present

## 2019-02-19 DIAGNOSIS — E038 Other specified hypothyroidism: Secondary | ICD-10-CM | POA: Diagnosis not present

## 2019-02-19 DIAGNOSIS — M810 Age-related osteoporosis without current pathological fracture: Secondary | ICD-10-CM | POA: Diagnosis not present

## 2019-02-20 ENCOUNTER — Ambulatory Visit: Payer: Medicare Other

## 2019-02-20 ENCOUNTER — Ambulatory Visit
Admission: RE | Admit: 2019-02-20 | Discharge: 2019-02-20 | Disposition: A | Discharge status: Home / Self Care | Payer: Medicare Other | Source: Ambulatory Visit | Attending: Internal Medicine | Admitting: Internal Medicine

## 2019-02-20 DIAGNOSIS — R928 Other abnormal and inconclusive findings on diagnostic imaging of breast: Secondary | ICD-10-CM

## 2019-03-06 ENCOUNTER — Institutional Professional Consult (permissible substitution): Payer: 59 | Admitting: Neurology

## 2019-05-21 DIAGNOSIS — H33301 Unspecified retinal break, right eye: Secondary | ICD-10-CM | POA: Diagnosis not present

## 2019-05-21 DIAGNOSIS — H2513 Age-related nuclear cataract, bilateral: Secondary | ICD-10-CM | POA: Diagnosis not present

## 2019-05-21 DIAGNOSIS — H43813 Vitreous degeneration, bilateral: Secondary | ICD-10-CM | POA: Diagnosis not present

## 2019-05-21 DIAGNOSIS — H33312 Horseshoe tear of retina without detachment, left eye: Secondary | ICD-10-CM | POA: Diagnosis not present

## 2019-06-20 DIAGNOSIS — M47816 Spondylosis without myelopathy or radiculopathy, lumbar region: Secondary | ICD-10-CM | POA: Diagnosis not present

## 2019-07-16 DIAGNOSIS — M533 Sacrococcygeal disorders, not elsewhere classified: Secondary | ICD-10-CM | POA: Diagnosis not present

## 2019-07-16 DIAGNOSIS — M545 Low back pain: Secondary | ICD-10-CM | POA: Diagnosis not present

## 2019-08-16 ENCOUNTER — Other Ambulatory Visit (HOSPITAL_COMMUNITY): Payer: Self-pay | Admitting: *Deleted

## 2019-08-19 ENCOUNTER — Other Ambulatory Visit: Payer: Self-pay

## 2019-08-19 ENCOUNTER — Ambulatory Visit (HOSPITAL_COMMUNITY)
Admission: RE | Admit: 2019-08-19 | Discharge: 2019-08-19 | Disposition: A | Discharge status: Home / Self Care | Payer: Medicare Other | Source: Ambulatory Visit | Attending: Internal Medicine | Admitting: Internal Medicine

## 2019-08-19 DIAGNOSIS — M810 Age-related osteoporosis without current pathological fracture: Secondary | ICD-10-CM | POA: Diagnosis not present

## 2019-08-19 MED ORDER — DENOSUMAB 60 MG/ML ~~LOC~~ SOSY
60.0000 mg | PREFILLED_SYRINGE | Freq: Once | SUBCUTANEOUS | Status: AC
  Administered 2019-08-19: 14:00:00 60 mg via SUBCUTANEOUS

## 2019-08-19 MED ORDER — DENOSUMAB 60 MG/ML ~~LOC~~ SOSY
PREFILLED_SYRINGE | SUBCUTANEOUS | Status: AC
  Administered 2019-08-19: 14:00:00 60 mg via SUBCUTANEOUS
  Filled 2019-08-19: qty 1

## 2019-08-21 DIAGNOSIS — M533 Sacrococcygeal disorders, not elsewhere classified: Secondary | ICD-10-CM | POA: Diagnosis not present

## 2019-08-28 DIAGNOSIS — E7801 Familial hypercholesterolemia: Secondary | ICD-10-CM | POA: Diagnosis not present

## 2019-08-28 DIAGNOSIS — M810 Age-related osteoporosis without current pathological fracture: Secondary | ICD-10-CM | POA: Diagnosis not present

## 2019-08-28 DIAGNOSIS — Z23 Encounter for immunization: Secondary | ICD-10-CM | POA: Diagnosis not present

## 2019-08-28 DIAGNOSIS — E890 Postprocedural hypothyroidism: Secondary | ICD-10-CM | POA: Diagnosis not present

## 2019-08-28 DIAGNOSIS — E119 Type 2 diabetes mellitus without complications: Secondary | ICD-10-CM | POA: Diagnosis not present

## 2019-10-01 DIAGNOSIS — E7801 Familial hypercholesterolemia: Secondary | ICD-10-CM | POA: Diagnosis not present

## 2019-10-01 DIAGNOSIS — E039 Hypothyroidism, unspecified: Secondary | ICD-10-CM | POA: Diagnosis not present

## 2019-10-01 DIAGNOSIS — E119 Type 2 diabetes mellitus without complications: Secondary | ICD-10-CM | POA: Diagnosis not present

## 2019-10-01 DIAGNOSIS — E041 Nontoxic single thyroid nodule: Secondary | ICD-10-CM | POA: Diagnosis not present

## 2019-10-01 DIAGNOSIS — M810 Age-related osteoporosis without current pathological fracture: Secondary | ICD-10-CM | POA: Diagnosis not present

## 2019-10-07 DIAGNOSIS — E119 Type 2 diabetes mellitus without complications: Secondary | ICD-10-CM | POA: Diagnosis not present

## 2019-10-08 DIAGNOSIS — F4323 Adjustment disorder with mixed anxiety and depressed mood: Secondary | ICD-10-CM | POA: Diagnosis not present

## 2019-10-21 DIAGNOSIS — E119 Type 2 diabetes mellitus without complications: Secondary | ICD-10-CM | POA: Diagnosis not present

## 2019-10-22 DIAGNOSIS — F4323 Adjustment disorder with mixed anxiety and depressed mood: Secondary | ICD-10-CM | POA: Diagnosis not present

## 2019-10-29 DIAGNOSIS — F4323 Adjustment disorder with mixed anxiety and depressed mood: Secondary | ICD-10-CM | POA: Diagnosis not present

## 2019-11-05 DIAGNOSIS — F4323 Adjustment disorder with mixed anxiety and depressed mood: Secondary | ICD-10-CM | POA: Diagnosis not present

## 2019-11-12 DIAGNOSIS — F4323 Adjustment disorder with mixed anxiety and depressed mood: Secondary | ICD-10-CM | POA: Diagnosis not present

## 2019-11-19 DIAGNOSIS — F4323 Adjustment disorder with mixed anxiety and depressed mood: Secondary | ICD-10-CM | POA: Diagnosis not present

## 2019-11-26 DIAGNOSIS — F4323 Adjustment disorder with mixed anxiety and depressed mood: Secondary | ICD-10-CM | POA: Diagnosis not present

## 2020-01-01 DIAGNOSIS — F4323 Adjustment disorder with mixed anxiety and depressed mood: Secondary | ICD-10-CM | POA: Diagnosis not present

## 2020-01-07 DIAGNOSIS — F4323 Adjustment disorder with mixed anxiety and depressed mood: Secondary | ICD-10-CM | POA: Diagnosis not present

## 2020-01-14 DIAGNOSIS — F4323 Adjustment disorder with mixed anxiety and depressed mood: Secondary | ICD-10-CM | POA: Diagnosis not present

## 2020-01-15 DIAGNOSIS — M533 Sacrococcygeal disorders, not elsewhere classified: Secondary | ICD-10-CM | POA: Diagnosis not present

## 2020-01-21 ENCOUNTER — Ambulatory Visit: Payer: Medicare Other

## 2020-01-21 DIAGNOSIS — F4323 Adjustment disorder with mixed anxiety and depressed mood: Secondary | ICD-10-CM | POA: Diagnosis not present

## 2020-01-28 DIAGNOSIS — F4323 Adjustment disorder with mixed anxiety and depressed mood: Secondary | ICD-10-CM | POA: Diagnosis not present

## 2020-01-30 ENCOUNTER — Ambulatory Visit: Payer: Medicare Other

## 2020-02-01 ENCOUNTER — Ambulatory Visit: Payer: Medicare Other | Attending: Internal Medicine

## 2020-02-01 DIAGNOSIS — Z23 Encounter for immunization: Secondary | ICD-10-CM | POA: Insufficient documentation

## 2020-02-01 NOTE — Progress Notes (Signed)
   Covid-19 Vaccination Clinic  Name:  [name]    MRN: [id number] DOB: 1951/07/31  02/01/2020  [name] was observed post Covid-19 immunization for 15 minutes without incidence. She was provided with Vaccine Information Sheet and instruction to access the V-Safe system.   [name] was instructed to call 911 with any severe reactions post vaccine: Marland Kitchen Difficulty breathing  . Swelling of your face and throat  . A fast heartbeat  . A bad rash all over your body  . Dizziness and weakness    Immunizations Administered    Name Date Dose VIS Date Route   Pfizer COVID-19 Vaccine 02/01/2020  1:42 PM 0.3 mL 12/06/2019 Intramuscular   Manufacturer: [place]   Lot: [id number]   [place]: [id number]

## 2020-02-04 DIAGNOSIS — F4323 Adjustment disorder with mixed anxiety and depressed mood: Secondary | ICD-10-CM | POA: Diagnosis not present

## 2020-02-11 ENCOUNTER — Ambulatory Visit: Payer: Medicare Other

## 2020-02-11 DIAGNOSIS — F4323 Adjustment disorder with mixed anxiety and depressed mood: Secondary | ICD-10-CM | POA: Diagnosis not present

## 2020-02-13 ENCOUNTER — Other Ambulatory Visit (HOSPITAL_COMMUNITY): Payer: Self-pay | Admitting: *Deleted

## 2020-02-14 ENCOUNTER — Other Ambulatory Visit: Payer: Self-pay

## 2020-02-14 ENCOUNTER — Ambulatory Visit (HOSPITAL_COMMUNITY)
Admission: RE | Admit: 2020-02-14 | Discharge: 2020-02-14 | Disposition: A | Discharge status: Home / Self Care | Payer: Medicare Other | Source: Ambulatory Visit | Attending: Family Medicine | Admitting: Family Medicine

## 2020-02-14 DIAGNOSIS — M810 Age-related osteoporosis without current pathological fracture: Secondary | ICD-10-CM | POA: Insufficient documentation

## 2020-02-14 DIAGNOSIS — Z87310 Personal history of (healed) osteoporosis fracture: Secondary | ICD-10-CM | POA: Insufficient documentation

## 2020-02-14 MED ORDER — DENOSUMAB 60 MG/ML ~~LOC~~ SOSY
PREFILLED_SYRINGE | SUBCUTANEOUS | Status: AC
  Filled 2020-02-14: qty 1

## 2020-02-14 MED ORDER — DENOSUMAB 60 MG/ML ~~LOC~~ SOSY
60.0000 mg | PREFILLED_SYRINGE | Freq: Once | SUBCUTANEOUS | Status: AC
  Administered 2020-02-14: 60 mg via SUBCUTANEOUS

## 2020-02-18 DIAGNOSIS — F4323 Adjustment disorder with mixed anxiety and depressed mood: Secondary | ICD-10-CM | POA: Diagnosis not present

## 2020-02-20 ENCOUNTER — Encounter (HOSPITAL_COMMUNITY): Payer: Medicare Other

## 2020-02-26 ENCOUNTER — Ambulatory Visit: Payer: Medicare Other | Attending: Internal Medicine

## 2020-02-26 DIAGNOSIS — Z23 Encounter for immunization: Secondary | ICD-10-CM | POA: Insufficient documentation

## 2020-02-26 NOTE — Progress Notes (Signed)
   Covid-19 Vaccination Clinic  Name:  [name]    MRN: [id number] DOB: 1951/07/02  02/26/2020  [name] was observed post Covid-19 immunization for 15 minutes without incident. She was provided with Vaccine Information Sheet and instruction to access the V-Safe system.   [name] was instructed to call 911 with any severe reactions post vaccine: Marland Kitchen Difficulty breathing  . Swelling of face and throat  . A fast heartbeat  . A bad rash all over body  . Dizziness and weakness   Immunizations Administered    Name Date Dose VIS Date Route   Pfizer COVID-19 Vaccine 02/26/2020  8:55 AM 0.3 mL 12/06/2019 Intramuscular   Manufacturer: [place]   Lot: [id number]   [place]: [id number]

## 2020-03-19 DIAGNOSIS — M47816 Spondylosis without myelopathy or radiculopathy, lumbar region: Secondary | ICD-10-CM | POA: Diagnosis not present

## 2020-03-23 ENCOUNTER — Other Ambulatory Visit: Payer: Self-pay | Admitting: Family Medicine

## 2020-03-23 DIAGNOSIS — Z1231 Encounter for screening mammogram for malignant neoplasm of breast: Secondary | ICD-10-CM

## 2020-03-31 DIAGNOSIS — E041 Nontoxic single thyroid nodule: Secondary | ICD-10-CM | POA: Diagnosis not present

## 2020-03-31 DIAGNOSIS — E7801 Familial hypercholesterolemia: Secondary | ICD-10-CM | POA: Diagnosis not present

## 2020-03-31 DIAGNOSIS — E039 Hypothyroidism, unspecified: Secondary | ICD-10-CM | POA: Diagnosis not present

## 2020-03-31 DIAGNOSIS — E1165 Type 2 diabetes mellitus with hyperglycemia: Secondary | ICD-10-CM | POA: Diagnosis not present

## 2020-03-31 DIAGNOSIS — M810 Age-related osteoporosis without current pathological fracture: Secondary | ICD-10-CM | POA: Diagnosis not present

## 2020-04-06 DIAGNOSIS — E119 Type 2 diabetes mellitus without complications: Secondary | ICD-10-CM | POA: Diagnosis not present

## 2020-04-06 DIAGNOSIS — E039 Hypothyroidism, unspecified: Secondary | ICD-10-CM | POA: Diagnosis not present

## 2020-04-06 DIAGNOSIS — E7801 Familial hypercholesterolemia: Secondary | ICD-10-CM | POA: Diagnosis not present

## 2020-04-13 ENCOUNTER — Ambulatory Visit
Admission: RE | Admit: 2020-04-13 | Discharge: 2020-04-13 | Disposition: A | Discharge status: Home / Self Care | Payer: Medicare Other | Source: Ambulatory Visit | Attending: Family Medicine | Admitting: Family Medicine

## 2020-04-13 ENCOUNTER — Other Ambulatory Visit: Payer: Self-pay

## 2020-04-13 DIAGNOSIS — Z1231 Encounter for screening mammogram for malignant neoplasm of breast: Secondary | ICD-10-CM

## 2020-05-13 DIAGNOSIS — H6983 Other specified disorders of Eustachian tube, bilateral: Secondary | ICD-10-CM | POA: Diagnosis not present

## 2020-05-13 DIAGNOSIS — E041 Nontoxic single thyroid nodule: Secondary | ICD-10-CM | POA: Diagnosis not present

## 2020-05-13 DIAGNOSIS — E7801 Familial hypercholesterolemia: Secondary | ICD-10-CM | POA: Diagnosis not present

## 2020-05-13 DIAGNOSIS — M810 Age-related osteoporosis without current pathological fracture: Secondary | ICD-10-CM | POA: Diagnosis not present

## 2020-05-13 DIAGNOSIS — E669 Obesity, unspecified: Secondary | ICD-10-CM | POA: Diagnosis not present

## 2020-05-13 DIAGNOSIS — E890 Postprocedural hypothyroidism: Secondary | ICD-10-CM | POA: Diagnosis not present

## 2020-05-13 DIAGNOSIS — I10 Essential (primary) hypertension: Secondary | ICD-10-CM | POA: Diagnosis not present

## 2020-05-13 DIAGNOSIS — Z9109 Other allergy status, other than to drugs and biological substances: Secondary | ICD-10-CM | POA: Diagnosis not present

## 2020-05-13 DIAGNOSIS — E119 Type 2 diabetes mellitus without complications: Secondary | ICD-10-CM | POA: Diagnosis not present

## 2020-05-13 DIAGNOSIS — Z0000 Encounter for general adult medical examination without abnormal findings: Secondary | ICD-10-CM | POA: Diagnosis not present

## 2020-05-13 DIAGNOSIS — M47816 Spondylosis without myelopathy or radiculopathy, lumbar region: Secondary | ICD-10-CM | POA: Diagnosis not present

## 2020-05-26 DIAGNOSIS — H33301 Unspecified retinal break, right eye: Secondary | ICD-10-CM | POA: Diagnosis not present

## 2020-05-26 DIAGNOSIS — H2513 Age-related nuclear cataract, bilateral: Secondary | ICD-10-CM | POA: Diagnosis not present

## 2020-05-26 DIAGNOSIS — H33312 Horseshoe tear of retina without detachment, left eye: Secondary | ICD-10-CM | POA: Diagnosis not present

## 2020-05-26 DIAGNOSIS — H43813 Vitreous degeneration, bilateral: Secondary | ICD-10-CM | POA: Diagnosis not present

## 2020-07-09 DIAGNOSIS — M47816 Spondylosis without myelopathy or radiculopathy, lumbar region: Secondary | ICD-10-CM | POA: Diagnosis not present

## 2020-07-16 DIAGNOSIS — M545 Low back pain: Secondary | ICD-10-CM | POA: Diagnosis not present

## 2020-07-30 DIAGNOSIS — M545 Low back pain: Secondary | ICD-10-CM | POA: Diagnosis not present

## 2020-08-07 DIAGNOSIS — M48062 Spinal stenosis, lumbar region with neurogenic claudication: Secondary | ICD-10-CM | POA: Diagnosis not present

## 2020-08-07 DIAGNOSIS — M5136 Other intervertebral disc degeneration, lumbar region: Secondary | ICD-10-CM | POA: Diagnosis not present

## 2020-08-07 DIAGNOSIS — M545 Low back pain: Secondary | ICD-10-CM | POA: Diagnosis not present

## 2020-08-07 DIAGNOSIS — M810 Age-related osteoporosis without current pathological fracture: Secondary | ICD-10-CM | POA: Diagnosis not present

## 2020-09-12 DIAGNOSIS — M5136 Other intervertebral disc degeneration, lumbar region: Secondary | ICD-10-CM | POA: Diagnosis not present

## 2020-09-29 DIAGNOSIS — E7801 Familial hypercholesterolemia: Secondary | ICD-10-CM | POA: Diagnosis not present

## 2020-09-29 DIAGNOSIS — E041 Nontoxic single thyroid nodule: Secondary | ICD-10-CM | POA: Diagnosis not present

## 2020-09-29 DIAGNOSIS — E119 Type 2 diabetes mellitus without complications: Secondary | ICD-10-CM | POA: Diagnosis not present

## 2020-09-29 DIAGNOSIS — M810 Age-related osteoporosis without current pathological fracture: Secondary | ICD-10-CM | POA: Diagnosis not present

## 2020-09-29 DIAGNOSIS — E039 Hypothyroidism, unspecified: Secondary | ICD-10-CM | POA: Diagnosis not present

## 2020-09-29 DIAGNOSIS — E669 Obesity, unspecified: Secondary | ICD-10-CM | POA: Diagnosis not present

## 2020-10-06 ENCOUNTER — Ambulatory Visit: Payer: Medicare Other | Attending: Internal Medicine

## 2020-10-06 ENCOUNTER — Other Ambulatory Visit (HOSPITAL_BASED_OUTPATIENT_CLINIC_OR_DEPARTMENT_OTHER): Payer: Self-pay | Admitting: Internal Medicine

## 2020-10-06 DIAGNOSIS — Z23 Encounter for immunization: Secondary | ICD-10-CM

## 2020-10-06 NOTE — Progress Notes (Signed)
   Covid-19 Vaccination Clinic  Name:  [name]    MRN: [id number] DOB: [birth year]  10/06/2020  [name] was observed post Covid-19 immunization for 15 minutes without incident. She was provided with Vaccine Information Sheet and instruction to access the V-Safe system. Vaccinated by [employer].  [name] was instructed to call 911 with any severe reactions post vaccine: Marland Kitchen Difficulty breathing  . Swelling of face and throat  . A fast heartbeat  . A bad rash all over body  . Dizziness and weakness

## 2020-10-09 ENCOUNTER — Ambulatory Visit: Payer: Medicare Other

## 2020-10-14 MED FILL — PFIZER-BIONTECH COVID-19 VA: 30 | 1 days supply | Qty: 0 | Fill #0

## 2020-10-16 DIAGNOSIS — M25561 Pain in right knee: Secondary | ICD-10-CM | POA: Diagnosis not present

## 2020-10-21 DIAGNOSIS — Z23 Encounter for immunization: Secondary | ICD-10-CM | POA: Diagnosis not present

## 2020-11-13 DIAGNOSIS — S82001D Unspecified fracture of right patella, subsequent encounter for closed fracture with routine healing: Secondary | ICD-10-CM | POA: Diagnosis not present

## 2020-11-25 DIAGNOSIS — H2513 Age-related nuclear cataract, bilateral: Secondary | ICD-10-CM | POA: Diagnosis not present

## 2020-11-25 DIAGNOSIS — H18523 Epithelial (juvenile) corneal dystrophy, bilateral: Secondary | ICD-10-CM | POA: Diagnosis not present

## 2021-04-02 ENCOUNTER — Ambulatory Visit: Payer: Medicare Other | Attending: Internal Medicine

## 2021-04-02 DIAGNOSIS — Z23 Encounter for immunization: Secondary | ICD-10-CM

## 2021-04-05 NOTE — Progress Notes (Signed)
   Covid-19 Vaccination Clinic  Name:  [name]    MRN: [id number] DOB: [birth year]  04/05/2021  [name] was observed post Covid-19 immunization for 15 minutes without incident. She was provided with Vaccine Information Sheet and instruction to access the V-Safe system.   [name] was instructed to call 911 with any severe reactions post vaccine: Marland Kitchen Difficulty breathing  . Swelling of face and throat  . A fast heartbeat  . A bad rash all over body  . Dizziness and weakness   Immunizations Administered    Name Date Dose VIS Date Route   Moderna Covid-19 Booster Vaccine 04/02/2021  2:21 PM 0.25 mL 10/14/2020 Intramuscular   Manufacturer: [place]   Lot: [id number]   [place]: [id number]

## 2021-04-09 ENCOUNTER — Other Ambulatory Visit (HOSPITAL_BASED_OUTPATIENT_CLINIC_OR_DEPARTMENT_OTHER): Payer: Self-pay

## 2021-04-09 MED ORDER — COVID-19 MRNA VACC (MODERNA) 100 MCG/0.5ML IM SUSP
INTRAMUSCULAR | 0 refills | Status: DC
  Filled 2021-04-09: qty 0.25, 1d supply, fill #0

## 2021-09-28 ENCOUNTER — Ambulatory Visit: Payer: Medicare Other | Attending: Internal Medicine

## 2021-09-28 DIAGNOSIS — Z23 Encounter for immunization: Secondary | ICD-10-CM

## 2021-09-28 NOTE — Progress Notes (Signed)
   Covid-19 Vaccination Clinic  Name:  [name]    MRN: [id number] DOB: January 10, 1951  09/28/2021  [name] was observed post Covid-19 immunization for 15 minutes without incident. She was provided with Vaccine Information Sheet and instruction to access the V-Safe system.   [name] was instructed to call 911 with any severe reactions post vaccine: Difficulty breathing  Swelling of face and throat  A fast heartbeat  A bad rash all over body  Dizziness and weakness

## 2021-10-08 ENCOUNTER — Other Ambulatory Visit (HOSPITAL_BASED_OUTPATIENT_CLINIC_OR_DEPARTMENT_OTHER): Payer: Self-pay

## 2021-10-08 MED ORDER — COVID-19MRNA BIVAL VACC PFIZER 30 MCG/0.3ML IM SUSP
INTRAMUSCULAR | 0 refills | Status: DC
  Filled 2021-10-08: qty 0.3, 1d supply, fill #0

## 2021-10-14 ENCOUNTER — Other Ambulatory Visit (HOSPITAL_BASED_OUTPATIENT_CLINIC_OR_DEPARTMENT_OTHER): Payer: Self-pay

## 2021-10-14 MED ORDER — INFLUENZA VAC A&B SA ADJ QUAD 0.5 ML IM PRSY
PREFILLED_SYRINGE | INTRAMUSCULAR | 0 refills | Status: DC
  Filled 2021-10-14: qty 0.5, 1d supply, fill #0

## 2021-10-22 ENCOUNTER — Other Ambulatory Visit (HOSPITAL_BASED_OUTPATIENT_CLINIC_OR_DEPARTMENT_OTHER): Payer: Self-pay

## 2022-03-28 ENCOUNTER — Other Ambulatory Visit: Payer: Self-pay | Admitting: Family Medicine

## 2022-03-28 DIAGNOSIS — Z1231 Encounter for screening mammogram for malignant neoplasm of breast: Secondary | ICD-10-CM

## 2022-03-29 ENCOUNTER — Ambulatory Visit
Admission: RE | Admit: 2022-03-29 | Discharge: 2022-03-29 | Disposition: A | Discharge status: Home / Self Care | Payer: Medicare Other | Source: Ambulatory Visit | Attending: Family Medicine | Admitting: Family Medicine

## 2022-03-29 DIAGNOSIS — Z1231 Encounter for screening mammogram for malignant neoplasm of breast: Secondary | ICD-10-CM

## 2022-04-14 ENCOUNTER — Ambulatory Visit: Payer: Medicare Other

## 2023-06-13 ENCOUNTER — Encounter (HOSPITAL_COMMUNITY): Payer: Self-pay

## 2023-06-13 ENCOUNTER — Other Ambulatory Visit: Payer: Self-pay

## 2023-06-13 ENCOUNTER — Emergency Department (HOSPITAL_COMMUNITY)
Admission: EM | Admit: 2023-06-13 | Discharge: 2023-06-13 | Disposition: A | Discharge status: Home / Self Care | Payer: Medicare Other | Attending: Emergency Medicine | Admitting: Emergency Medicine

## 2023-06-13 DIAGNOSIS — U071 COVID-19: Secondary | ICD-10-CM | POA: Diagnosis not present

## 2023-06-13 DIAGNOSIS — I10 Essential (primary) hypertension: Secondary | ICD-10-CM | POA: Insufficient documentation

## 2023-06-13 DIAGNOSIS — R059 Cough, unspecified: Secondary | ICD-10-CM | POA: Diagnosis present

## 2023-06-13 DIAGNOSIS — Z79899 Other long term (current) drug therapy: Secondary | ICD-10-CM | POA: Diagnosis not present

## 2023-06-13 DIAGNOSIS — Z91010 Allergy to peanuts: Secondary | ICD-10-CM | POA: Insufficient documentation

## 2023-06-13 LAB — SARS CORONAVIRUS 2 BY RT PCR: SARS Coronavirus 2 by RT PCR: POSITIVE — AB

## 2023-06-13 MED ORDER — BENZONATATE 100 MG PO CAPS: 100.0000 mg | ORAL_CAPSULE | Freq: Three times a day (TID) | ORAL | 0 refills | Status: DC

## 2023-06-13 NOTE — ED Triage Notes (Signed)
Patient is here for evaluation covid symptoms. Pt reports headaches, cough, sore throat, ear congestion. Husband also has COVID and they just returned from [place] last night.

## 2023-06-13 NOTE — ED Provider Notes (Signed)
[place] EMERGENCY DEPARTMENT AT [facility] Provider Note   CSN: [id number] Arrival date & time: 06/13/23  1211     History  Chief Complaint  Patient presents with   Cough    [name] is a 72 y.o. female.  Patient with a history focal seizures presents to the ED with cough, sore throat, sinus congestion, HA and hoarseness for 5 days. Patient describes her cough as dry and nonproductive. Patient states her husband tested positive for COVID 2 days ago. Patient says they got back from [place] last night. Patient endorses fever, chills, anorexia and denies chest pain, SOB, abdominal pain, bowel or urinary sxs. Patient says she's been taking Zyrtec, Azelastine nasal spray, tylenol and pseudofed with minimal relief. Patient denies history of asthma or COPD.   HPI     Home Medications Prior to Admission medications   Medication Sig Start Date End Date Taking? Authorizing Provider  acetaminophen (TYLENOL) 325 MG tablet Take 2 tablets (650 mg total) by mouth every 6 (six) hours as needed. 12/16/14   Beverely Low, MD  Alirocumab (PRALUENT) 75 MG/ML SOPN Inject 75 Units into the skin every 14 (fourteen) days.    [provider]  amoxicillin-clavulanate (AUGMENTIN) 875-125 MG tablet TK 1 T PO BID FOR 10 DAYS 06/26/17   [provider]  azelastine (ASTELIN) 0.1 % nasal spray Place 1 spray into both nostrils 2 (two) times daily. Use in each nostril as directed 08/04/14   Zola Button, Myrene Buddy R, DO  COVID-19 mRNA bivalent vaccine, Pfizer, injection Inject into the muscle. 09/28/21   Judyann Munson, MD  COVID-19 mRNA vaccine, Moderna, 100 MCG/0.5ML injection Inject into the muscle. 04/02/21   Judyann Munson, MD  diclofenac sodium (VOLTAREN) 1 % GEL Apply 2 g topically 4 (four) times daily. 05/04/15   Angiulli, Mcarthur Rossetti, PA-C  EPINEPHrine 0.3 mg/0.3 mL IJ SOAJ injection Use as directed for life-threatening allergic reaction. 07/04/17   Kozlow, Alvira Philips, MD   Evolocumab 140 MG/ML SOAJ Inject 140 mg into the skin every 14 (fourteen) days. 05/01/17   [provider]  fexofenadine (ALLEGRA) 180 MG tablet Take 180 mg by mouth daily.    [provider]  fluticasone (FLONASE) 50 MCG/ACT nasal spray Place 2 sprays into both nostrils daily. 08/04/14   Seabron Spates R, DO  influenza vaccine adjuvanted (FLUAD) 0.5 ML injection Inject into the muscle. 10/14/21   Judyann Munson, MD  levothyroxine (SYNTHROID, LEVOTHROID) 100 MCG tablet Take 100 mcg by mouth daily before breakfast.    [provider]  montelukast (SINGULAIR) 10 MG tablet Take 1 tablet (10 mg total) by mouth at bedtime. 07/04/17   Kozlow, Alvira Philips, MD  telmisartan (MICARDIS) 40 MG tablet Take 40 mg by mouth daily.    [provider]  valsartan (DIOVAN) 320 MG tablet TAKE 1 TABLET DAILY 08/04/14   Zola Button, Grayling Congress, DO  Vitamin D, Ergocalciferol, (DRISDOL) 50000 UNITS CAPS capsule Take 50,000 Units by mouth 3 (three) times a week.    [provider]      Allergies    Gluten meal, Peanut-containing drug products, Soy allergy, Statins, Egg-derived products, and Shrimp [shellfish allergy]    Review of Systems   Review of Systems  Physical Exam Updated Vital Signs BP (!) 159/78 (BP Location: Left Arm)   Pulse 90   Temp 99.8 F (37.7 C) (Oral)   Resp 18   Ht 5\' 2"  (1.575 m)   Wt 79.4 kg  SpO2 97%   BMI 32.01 kg/m  Physical Exam Vitals and nursing note reviewed.  Constitutional:      General: She is not in acute distress.    Appearance: She is well-developed.  HENT:     Head: Normocephalic and atraumatic.     Right Ear: Tympanic membrane normal.     Left Ear: Tympanic membrane normal.     Nose: Congestion present.     Mouth/Throat:     Pharynx: No posterior oropharyngeal erythema.  Eyes:     Conjunctiva/sclera: Conjunctivae normal.  Cardiovascular:     Rate and Rhythm: Normal rate and regular rhythm.     Heart sounds: No murmur  heard. Pulmonary:     Effort: Pulmonary effort is normal. No respiratory distress.     Breath sounds: Normal breath sounds.  Abdominal:     Palpations: Abdomen is soft.     Tenderness: There is no abdominal tenderness.  Musculoskeletal:        General: No swelling.     Cervical back: Neck supple.  Skin:    General: Skin is warm and dry.     Capillary Refill: Capillary refill takes less than 2 seconds.  Neurological:     Mental Status: She is alert.  Psychiatric:        Mood and Affect: Mood normal.     ED Results / Procedures / Treatments   Labs (all labs ordered are listed, but only abnormal results are displayed) Labs Reviewed  SARS CORONAVIRUS 2 BY RT PCR - Abnormal; Notable for the following components:      Result Value   SARS Coronavirus 2 by RT PCR POSITIVE (*)    All other components within normal limits    EKG None  Radiology No results found.  Procedures Procedures    Medications Ordered in ED Medications - No data to display  ED Course/ Medical Decision Making/ A&P                             Medical Decision Making  Patient presents to the emergency department with symptoms consistent with respiratory infection.  Differential diagnosis includes but is not limited to COVID-19, influenza, RSV, other viruses, and others  The patient has comorbidities including hypertension  I ordered and reviewed labs.  Patient tested positive for COVID-19  Patient is on day 5 or 6 of symptoms.  I discussed possibly initiating Paxlovid and participated in shared decision-making with the patient.  We will not proceed with Paxlovid at this time.  She is on outside window of the medication.  Her symptoms seem mild to moderate in nature.  Plan to have patient continue with over-the-counter medications for symptom management.  I will prescribe Tessalon Perles for cough.  There is no indication at this time for admission.  Discharge home.        Final Clinical  Impression(s) / ED Diagnoses Final diagnoses:  COVID-19    Rx / DC Orders ED Discharge Orders     None         Pamala Duffel 06/13/23 1336    Pricilla Loveless, MD 06/15/23 [phone]

## 2023-06-13 NOTE — Discharge Instructions (Addendum)
You were diagnosed today with COVID-19.  I have prescribed Tessalon Perles for cough.  You may continue to take over-the-counter medications for symptom management.  Please take the prescription medication as directed.  Please follow-up with your primary care provider as needed.  If you develop any life-threatening symptoms please return to the emergency department.

## 2024-05-15 ENCOUNTER — Telehealth (HOSPITAL_COMMUNITY): Payer: Self-pay

## 2024-05-15 ENCOUNTER — Encounter (HOSPITAL_COMMUNITY): Payer: Self-pay | Admitting: Interventional Radiology

## 2024-05-15 DIAGNOSIS — S32050A Wedge compression fracture of fifth lumbar vertebra, initial encounter for closed fracture: Secondary | ICD-10-CM

## 2024-05-15 NOTE — Telephone Encounter (Signed)
 Ok per Dr. Alvira Josephs for L5 VP/KP. AB

## 2024-05-16 ENCOUNTER — Other Ambulatory Visit (HOSPITAL_COMMUNITY): Payer: Self-pay | Admitting: Interventional Radiology

## 2024-05-16 DIAGNOSIS — S32050A Wedge compression fracture of fifth lumbar vertebra, initial encounter for closed fracture: Secondary | ICD-10-CM

## 2024-05-21 ENCOUNTER — Other Ambulatory Visit: Payer: Self-pay | Admitting: Student

## 2024-05-21 ENCOUNTER — Other Ambulatory Visit: Payer: Self-pay | Admitting: Radiology

## 2024-05-21 DIAGNOSIS — S32050A Wedge compression fracture of fifth lumbar vertebra, initial encounter for closed fracture: Secondary | ICD-10-CM

## 2024-05-21 NOTE — H&P (Signed)
 Chief Complaint: Patient was seen in consultation today for compression fracture of L5   Procedure: Vertebroplasty of L5  Referring Physician(s): Dr. Adelaide Adjutant  Supervising Physician: Luellen Sages  Patient Status: [facility] - Out-pt  History of Present Illness: [name] is a 73 y.o. female with a history of bladder cancer with no known metastases, osteoporosis on Prolia , and ongoing back and leg pain that prevents her from bending forward and causes difficulty walking. Reports that her pain occasionally radiates down to her right ankle and causes her pain while sitting. She has been taking Hydrocodone  for pain management with some reported improvement in pain. Patient was referred to IR previously to evaluate for candidacy of vertebroplasty which was subsequently approved by Dr. Jory Ng.  Patient presents today for her procedure stating that ***. Labs *** VSS ***. All questions and concerns answered at the bedside.    Code Status: ***  Past Medical History:  Diagnosis Date   Arthritis    Bilateral in the Knees and fingers (04/22/2015)   Chickenpox    Complication of anesthesia    "hard to wake up:   Environmental allergies    Family history of adverse reaction to anesthesia    "sister & mom are hard to wake up:   Food allergy    Peanuts, eggs, shrimp/shellfish, wheat, soy   Genital warts    Heart murmur    HTN (hypertension)    Hyperlipidemia    Hypothyroidism    Thyroid  disease    Thyroid  nodule     Past Surgical History:  Procedure Laterality Date   CARPAL TUNNEL RELEASE Bilateral 1990's   CYSTECTOMY     From the cervix   FRACTURE SURGERY     INTRAMEDULLARY (IM) NAIL INTERTROCHANTERIC Left 12/15/2014   Procedure: INTRAMEDULLARY (IM) NAIL INTERTROCHANTRIC;  Surgeon: Lorriane Rote, MD;  Location: [facility] ORS;  Service: Orthopedics;  Laterality: Left;   OTHER SURGICAL HISTORY Left 12/2016   Bone spur on foot   TOTAL HIP ARTHROPLASTY Left  04/24/2015   Procedure:  TOTAL HIP ARTHROPLASTY AND REMOVAL OF IM AFFIXUS NAIL ;  Surgeon: Claiborne Crew, MD;  Location: [facility] OR;  Service: Orthopedics;  Laterality: Left;    Allergies: Gluten meal, Peanut-containing drug products, Soy allergy (obsolete), Statins, Egg-derived products, and Shrimp [shellfish allergy]  Medications: Prior to Admission medications   Medication Sig Start Date End Date Taking? Authorizing Provider  acetaminophen  (TYLENOL ) 325 MG tablet Take 2 tablets (650 mg total) by mouth every 6 (six) hours as needed. 12/16/14   Winston Hawking, MD  Alirocumab (PRALUENT) 75 MG/ML SOPN Inject 75 Units into the skin every 14 (fourteen) days.    [provider]  amoxicillin-clavulanate (AUGMENTIN) 875-125 MG tablet TK 1 T PO BID FOR 10 DAYS 06/26/17   [provider]  azelastine  (ASTELIN ) 0.1 % nasal spray Place 1 spray into both nostrils 2 (two) times daily. Use in each nostril as directed 08/04/14   Crecencio Dodge, Adel Holt R, DO  benzonatate  (TESSALON ) 100 MG capsule Take 1 capsule (100 mg total) by mouth every 8 (eight) hours. 06/13/23   Elisa Guest, PA-C  COVID-19 mRNA bivalent vaccine, Pfizer, injection Inject into the muscle. 09/28/21   Liane Redman, MD  COVID-19 mRNA vaccine, Moderna, 100 MCG/0.5ML injection Inject into the muscle. 04/02/21   Liane Redman, MD  diclofenac  sodium (VOLTAREN ) 1 % GEL Apply 2 g topically 4 (four) times daily. 05/04/15   Angiulli, Everlyn Hockey, PA-C  EPINEPHrine  0.3 mg/0.3 mL  IJ SOAJ injection Use as directed for life-threatening allergic reaction. 07/04/17   Kozlow, Rema Care, MD  Evolocumab 140 MG/ML SOAJ Inject 140 mg into the skin every 14 (fourteen) days. 05/01/17   [provider]  fexofenadine (ALLEGRA) 180 MG tablet Take 180 mg by mouth daily.    [provider]  fluticasone  (FLONASE ) 50 MCG/ACT nasal spray Place 2 sprays into both nostrils daily. 08/04/14   Roel Clarity R, DO  influenza vaccine adjuvanted (FLUAD)  0.5 ML injection Inject into the muscle. 10/14/21   Liane Redman, MD  levothyroxine  (SYNTHROID , LEVOTHROID) 100 MCG tablet Take 100 mcg by mouth daily before breakfast.    [provider]  montelukast  (SINGULAIR ) 10 MG tablet Take 1 tablet (10 mg total) by mouth at bedtime. 07/04/17   Kozlow, Rema Care, MD  telmisartan (MICARDIS) 40 MG tablet Take 40 mg by mouth daily.    [provider]  valsartan  (DIOVAN ) 320 MG tablet TAKE 1 TABLET DAILY 08/04/14   Roel Clarity R, DO  Vitamin D , Ergocalciferol , (DRISDOL ) 50000 UNITS CAPS capsule Take 50,000 Units by mouth 3 (three) times a week.    [provider]     Family History  Problem Relation Age of Onset   Arthritis Mother    Hyperlipidemia Mother    High blood pressure Mother    Diabetes Mother    Myasthenia gravis Mother    Heart disease Father    Stroke Father        x's 2   High blood pressure Father    Diabetes Maternal Grandmother    Heart disease Paternal Grandmother    Heart disease Paternal Grandfather     Social History   Socioeconomic History   Marital status: Married    Spouse name: Not on file   Number of children: Not on file   Years of education: Not on file   Highest education level: Not on file  Occupational History   Not on file  Tobacco Use   Smoking status: Never   Smokeless tobacco: Never  Substance and Sexual Activity   Alcohol use: Yes    Comment: 04/22/2015 "a gtlass of wine maybe a couple times/month"   Drug use: No   Sexual activity: Yes    Partners: Male  Other Topics Concern   Not on file  Social History Narrative   Exercise-- walk-- 4-7 x a week   Social Drivers of Corporate investment banker Strain: Low Risk  (12/01/2022)   Received [facility]   Overall Financial Resource Strain (CARDIA)    Difficulty of Paying Living Expenses: Not hard at all  Food Insecurity: Low Risk  (04/22/2024)   Received from [facility]   Hunger Vital Sign    Worried About  Running Out of Food in the Last Year: Never true    Ran Out of Food in the Last Year: Never true  Transportation Needs: No Transportation Needs (04/22/2024)   Received from [facility]    In the past 12 months, has lack of reliable transportation kept you from medical appointments, meetings, work or from getting things needed for daily living? : No  Physical Activity: Sufficiently Active (12/01/2022)   Received from [facility]   Exercise Vital Sign    Days of Exercise per Week: 5 days    Minutes of Exercise per Session: 30 min  Stress: No Stress Concern Present (07/26/2022)   [facility]  [employer] of Occupational Health - Occupational Stress Questionnaire    Feeling of Stress : Not at all  Social Connections: Unknown (08/07/2023)   Received from [facility]   Social Network    Social Network: Not on file    Review of Systems Denies any N/V, chest pain, shortness of breath, fevers/chills. All other ROS negative.  Vital Signs: There were no vitals taken for this visit.  Advance Care Plan: {Advance Care VOZD:66440}    Physical Exam  Imaging: No results found.  Labs:  CBC: No results for input(s): "WBC", "HGB", "HCT", "PLT" in the last 8760 hours.  COAGS: No results for input(s): "INR", "APTT" in the last 8760 hours.  BMP: No results for input(s): "NA", "K", "CL", "CO2", "GLUCOSE", "BUN", "CALCIUM", "CREATININE", "GFRNONAA", "GFRAA" in the last 8760 hours.  Invalid input(s): "CMP"  LIVER FUNCTION TESTS: No results for input(s): "BILITOT", "AST", "ALT", "ALKPHOS", "PROT", "ALBUMIN " in the last 8760 hours.  TUMOR MARKERS: No results for input(s): "AFPTM", "CEA", "CA199", "CHROMGRNA" in the last 8760 hours.  Assessment and Plan:  Compression fracture of L5: Saretta Dahlem is a 73 y.o. female with a history of osteoporosis on Prolia , bladder cancer, and ongoing lower back pain who presents to [facility] Interventional Radiology department for an image-guided L5 vertebroplasty with Dr. Jory Ng on 05/22/24. Procedure to be performed under moderate sedation.  Risks and benefits of L5 vertebroplasty were discussed with the patient including, but not limited to education regarding the natural healing process of compression fractures without intervention, bleeding, infection, cement migration which may cause spinal cord damage, paralysis, pulmonary embolism or even death.  This interventional procedure involves the use of X-rays and because of the nature of the planned procedure, it is possible that we will have prolonged use of X-ray fluoroscopy.  Potential radiation risks to you include (but are not limited to) the following: - A slightly elevated risk for cancer  several years later in life. This risk is typically less than 0.5% percent. This risk is low in comparison to the normal incidence of human cancer, which is 33% for women and 50% for men according to the American Cancer Society. - Radiation induced injury can include skin redness, resembling a rash, tissue breakdown / ulcers and hair loss (which can be temporary or permanent).   The likelihood of either of these occurring depends on the difficulty of the procedure and whether you are sensitive to radiation due to previous procedures, disease, or genetic conditions.   IF your procedure requires a prolonged use of radiation, you will be notified and given written instructions for further action.  It is your responsibility to monitor the irradiated area for the 2 weeks following the procedure and to notify your physician if you are concerned that you have suffered a radiation induced injury.    All of the patient's questions were answered, patient is agreeable to proceed.  Consent signed and in chart.   Thank you for this interesting consult. I greatly enjoyed meeting Andilynn Georgeann Mazur and look forward to participating in  their care. A copy of this report was sent to the requesting provider on this date.  Electronically Signed: Orson Blalock, PA-C 05/21/2024, 12:38 PM   I spent a total of  {New Out-Pt:[id number]}  in face to face clinical consultation, greater than 50% of which was counseling/coordinating care for L5 vertebroplasty.

## 2024-05-22 ENCOUNTER — Other Ambulatory Visit (HOSPITAL_COMMUNITY): Payer: Self-pay | Admitting: Interventional Radiology

## 2024-05-22 ENCOUNTER — Ambulatory Visit (HOSPITAL_COMMUNITY)
Admission: RE | Admit: 2024-05-22 | Discharge: 2024-05-22 | Disposition: A | Discharge status: Home / Self Care | Source: Ambulatory Visit | Attending: Interventional Radiology | Admitting: Interventional Radiology

## 2024-05-22 ENCOUNTER — Other Ambulatory Visit: Payer: Self-pay

## 2024-05-22 DIAGNOSIS — S32050A Wedge compression fracture of fifth lumbar vertebra, initial encounter for closed fracture: Secondary | ICD-10-CM

## 2024-05-22 DIAGNOSIS — Z8551 Personal history of malignant neoplasm of bladder: Secondary | ICD-10-CM | POA: Insufficient documentation

## 2024-05-22 DIAGNOSIS — Z79899 Other long term (current) drug therapy: Secondary | ICD-10-CM | POA: Insufficient documentation

## 2024-05-22 DIAGNOSIS — M8008XA Age-related osteoporosis with current pathological fracture, vertebra(e), initial encounter for fracture: Secondary | ICD-10-CM | POA: Diagnosis present

## 2024-05-22 DIAGNOSIS — X58XXXA Exposure to other specified factors, initial encounter: Secondary | ICD-10-CM | POA: Insufficient documentation

## 2024-05-22 HISTORY — PX: IR KYPHO LUMBAR INC FX REDUCE BONE BX UNI/BIL CANNULATION INC/IMAGING: IMG5519

## 2024-05-22 LAB — CBC WITH DIFFERENTIAL/PLATELET
Abs Immature Granulocytes: 0.06 10*3/uL (ref 0.00–0.07)
Basophils Absolute: 0.1 10*3/uL (ref 0.0–0.1)
Basophils Relative: 1 %
Eosinophils Absolute: 0.2 10*3/uL (ref 0.0–0.5)
Eosinophils Relative: 3 %
HCT: 38 % (ref 36.0–46.0)
Hemoglobin: 12.3 g/dL (ref 12.0–15.0)
Immature Granulocytes: 1 %
Lymphocytes Relative: 22 %
Lymphs Abs: 1.9 10*3/uL (ref 0.7–4.0)
MCH: 28.7 pg (ref 26.0–34.0)
MCHC: 32.4 g/dL (ref 30.0–36.0)
MCV: 88.6 fL (ref 80.0–100.0)
Monocytes Absolute: 0.7 10*3/uL (ref 0.1–1.0)
Monocytes Relative: 8 %
Neutro Abs: 5.5 10*3/uL (ref 1.7–7.7)
Neutrophils Relative %: 65 %
Platelets: 288 10*3/uL (ref 150–400)
RBC: 4.29 MIL/uL (ref 3.87–5.11)
RDW: 12.5 % (ref 11.5–15.5)
WBC: 8.4 10*3/uL (ref 4.0–10.5)
nRBC: 0 % (ref 0.0–0.2)

## 2024-05-22 LAB — PROTIME-INR
INR: 1 (ref 0.8–1.2)
Prothrombin Time: 13 s (ref 11.4–15.2)

## 2024-05-22 LAB — BASIC METABOLIC PANEL WITH GFR
Anion gap: 4 — ABNORMAL LOW (ref 5–15)
BUN: 26 mg/dL — ABNORMAL HIGH (ref 8–23)
CO2: 25 mmol/L (ref 22–32)
Calcium: 9 mg/dL (ref 8.9–10.3)
Chloride: 105 mmol/L (ref 98–111)
Creatinine, Ser: 0.93 mg/dL (ref 0.44–1.00)
GFR, Estimated: >60 mL/min (ref >60)
Glucose, Bld: 146 mg/dL — ABNORMAL HIGH (ref 70–99)
Potassium: 4.6 mmol/L (ref 3.5–5.1)
Sodium: 134 mmol/L — ABNORMAL LOW (ref 135–145)

## 2024-05-22 MED ORDER — BUPIVACAINE HCL (PF) 0.25 % IJ SOLN
INTRAMUSCULAR | Status: AC
  Filled 2024-05-22: qty 30

## 2024-05-22 MED ORDER — CEFAZOLIN SODIUM-DEXTROSE 2-4 GM/100ML-% IV SOLN: 2.0000 g | INTRAVENOUS | Status: AC

## 2024-05-22 MED ORDER — FENTANYL CITRATE (PF) 100 MCG/2ML IJ SOLN
INTRAMUSCULAR | Status: AC | PRN
  Administered 2024-05-22 (×2): 25 ug via INTRAVENOUS

## 2024-05-22 MED ORDER — BUPIVACAINE HCL 0.25 % IJ SOLN: 30.0000 mL | Freq: Once | INTRAMUSCULAR | Status: DC

## 2024-05-22 MED ORDER — HYDROMORPHONE HCL 1 MG/ML IJ SOLN
INTRAMUSCULAR | Status: AC | PRN
  Administered 2024-05-22: 1 mg via INTRAVENOUS

## 2024-05-22 MED ORDER — CEFAZOLIN SODIUM-DEXTROSE 2-4 GM/100ML-% IV SOLN
INTRAVENOUS | Status: AC | PRN
  Administered 2024-05-22: 2 g via INTRAVENOUS

## 2024-05-22 MED ORDER — MIDAZOLAM HCL 2 MG/2ML IJ SOLN
INTRAMUSCULAR | Status: AC
  Filled 2024-05-22: qty 2

## 2024-05-22 MED ORDER — POTASSIUM CHLORIDE IN NACL 20-0.9 MEQ/L-% IV SOLN: INTRAVENOUS | Status: DC

## 2024-05-22 MED ORDER — FENTANYL CITRATE (PF) 100 MCG/2ML IJ SOLN
INTRAMUSCULAR | Status: AC
  Filled 2024-05-22: qty 2

## 2024-05-22 MED ORDER — TOBRAMYCIN SULFATE 1.2 G IJ SOLR
INTRAMUSCULAR | Status: AC
  Filled 2024-05-22: qty 1.2

## 2024-05-22 MED ORDER — MIDAZOLAM HCL 2 MG/2ML IJ SOLN
INTRAMUSCULAR | Status: AC | PRN
  Administered 2024-05-22 (×3): 1 mg via INTRAVENOUS

## 2024-05-22 MED ORDER — HYDROMORPHONE HCL 1 MG/ML IJ SOLN
INTRAMUSCULAR | Status: AC
  Filled 2024-05-22: qty 1

## 2024-05-22 MED ORDER — SODIUM CHLORIDE 0.9 % IV SOLN: INTRAVENOUS | Status: DC

## 2024-05-22 MED ORDER — CEFAZOLIN SODIUM-DEXTROSE 2-4 GM/100ML-% IV SOLN
INTRAVENOUS | Status: AC
  Filled 2024-05-22: qty 100

## 2024-05-22 NOTE — Discharge Instructions (Signed)
 INR.  No stooping bending or lifting weights above 10 pounds for 2 weeks.  Use a walker to ambulate for 2 weeks.  No driving for 2 weeks.  See  referring MD in 2 weeks as needed.

## 2024-05-22 NOTE — Progress Notes (Signed)
 Patient ambulated with walker to bathroom. Both dressing have slight amt bloody drainage. Areas marked.

## 2024-05-22 NOTE — Procedures (Signed)
 INR.  S/P L 5 balloon KP with fluoroscopic guidance. Bilateral approach.  No acute complications.   Patient tolerated the procedure  well.  S.Saleah Rishel MD

## 2024-06-22 ENCOUNTER — Encounter (HOSPITAL_COMMUNITY): Payer: Self-pay | Admitting: Interventional Radiology

## 2024-06-27 ENCOUNTER — Other Ambulatory Visit: Payer: Self-pay | Admitting: Family Medicine

## 2024-06-27 DIAGNOSIS — Z1231 Encounter for screening mammogram for malignant neoplasm of breast: Secondary | ICD-10-CM

## 2024-07-09 ENCOUNTER — Ambulatory Visit

## 2024-08-22 ENCOUNTER — Ambulatory Visit
Admission: RE | Admit: 2024-08-22 | Discharge: 2024-08-22 | Disposition: A | Discharge status: Home / Self Care | Source: Ambulatory Visit | Attending: Family Medicine | Admitting: Family Medicine

## 2024-08-22 DIAGNOSIS — Z1231 Encounter for screening mammogram for malignant neoplasm of breast: Secondary | ICD-10-CM

## 2024-11-08 NOTE — Progress Notes (Signed)
 Patient reports to clinic for 2 of 6 bcg. Urine obtained via clean catch. Bacteria count of 30-49.  Per Dr. Steen obtain via in and out. If bacteria count is less can go ahead and continue with BCG. If bacteria count remains the same or higher to reschedule BCG.   Patient placed in supine position. Cleansed patient with wet wipe and chlorhexidine  due to betadine allergy.  18f in and out catheter inserted and obtained 20cc of dark cloudy urine and sent to lab for processing. New bacteria count of >50. Per Tyronne Hasty, NP send urine out for culture. Patient rescheduled. Educated patient on importance of adequate fluid intake as patient admits she does not drink enough water. Patient verbalized understanding. No further questions.    Jamila Hinton Carver, NP

## 2024-11-12 NOTE — Progress Notes (Signed)
 Surgical Instructions   Your procedure is scheduled on November 19, 2024. Report to [facility] Main Entrance A at 5:30 A.M., then check in with the Admitting office. Any questions or running late day of surgery: call [phone]  Questions prior to your surgery date: call [phone], Monday-Friday, 8am-4pm. If you experience any cold or flu symptoms such as cough, fever, chills, shortness of breath, etc. between now and your scheduled surgery, please notify us  at the above number.     Remember:  Do not eat after midnight the night before your surgery   You may drink clear liquids until 4:30 the morning of your surgery.   Clear liquids allowed are: Water, Non-Citrus Juices (without pulp), Carbonated Beverages, Clear Tea (no milk, honey, etc.), Black Coffee Only (NO MILK, CREAM OR POWDERED CREAMER of any kind), and Gatorade.    Take these medicines the morning of surgery with A SIP OF WATER  amLODipine (NORVASC) levETIRAcetam (KEPPRA)  levothyroxine  (SYNTHROID , LEVOTHROID)  mirabegron ER (MYRBETRIQ)    May take these medicines IF NEEDED: acetaminophen  (TYLENOL )   azelastine  (ASTELIN )  morphine  (MSIR)    One week prior to surgery, STOP taking any Aspirin (unless otherwise instructed by your surgeon) Aleve, Naproxen, Ibuprofen , Motrin , Advil , Goody's, BC's, all herbal medications, fish oil, and non-prescription vitamins.                     Do NOT Smoke (Tobacco/Vaping) for 24 hours prior to your procedure.  If you use a CPAP at night, you may bring your mask/headgear for your overnight stay.   You will be asked to remove any contacts, glasses, piercing's, hearing aid's, dentures/partials prior to surgery. Please bring cases for these items if needed.    Patients discharged the day of surgery will not be allowed to drive home, and someone needs to stay with them for 24 hours.  SURGICAL WAITING ROOM VISITATION Patients may have no more than 2 support people in the waiting  area - these visitors may rotate.   Pre-op nurse will coordinate an appropriate time for 1 ADULT support person, who may not rotate, to accompany patient in pre-op.  Children under the age of 70 must have an adult with them who is not the patient and must remain in the main waiting area with an adult.  If the patient needs to stay at the hospital during part of their recovery, the visitor guidelines for inpatient rooms apply.  Please refer to the [facility] website for the visitor guidelines for any additional information.   If you received a COVID test during your pre-op visit  it is requested that you wear a mask when out in public, stay away from anyone that may not be feeling well and notify your surgeon if you develop symptoms. If you have been in contact with anyone that has tested positive in the last 10 days please notify you surgeon.      Pre-operative CHG Bathing Instructions   You can play a key role in reducing the risk of infection after surgery. Your skin needs to be as free of germs as possible. You can reduce the number of germs on your skin by washing with CHG (chlorhexidine  gluconate) soap before surgery. CHG is an antiseptic soap that kills germs and continues to kill germs even after washing.   DO NOT use if you have an allergy to chlorhexidine /CHG or antibacterial soaps. If your skin becomes reddened or irritated, stop using the CHG and notify one  of our RNs at [phone].              TAKE A SHOWER THE NIGHT BEFORE SURGERY   Please keep in mind the following:  DO NOT shave, including legs and underarms, 48 hours prior to surgery.   You may shave your face before/day of surgery.  Place clean sheets on your bed the night before surgery Use a clean washcloth (not used since being washed) for shower. DO NOT sleep with pet's night before surgery.  CHG Shower Instructions:  Wash your face and private area with normal soap. If you choose to wash your hair, wash first  with your normal shampoo.  After you use shampoo/soap, rinse your hair and body thoroughly to remove shampoo/soap residue.  Turn the water OFF and apply half the bottle of CHG soap to a CLEAN washcloth.  Apply CHG soap ONLY FROM YOUR NECK DOWN TO YOUR TOES (washing for 3-5 minutes)  DO NOT use CHG soap on face, private areas, open wounds, or sores.  Pay special attention to the area where your surgery is being performed.  If you are having back surgery, having someone wash your back for you may be helpful. Wait 2 minutes after CHG soap is applied, then you may rinse off the CHG soap.  Pat dry with a clean towel  Put on clean pajamas    Additional instructions for the day of surgery: If you choose, you may shower the morning of surgery with an antibacterial soap.  DO NOT APPLY any lotions, deodorants, cologne, or perfumes.   Do not wear jewelry or makeup Do not wear nail polish, gel polish, artificial nails, or any other type of covering on natural nails (fingers and toes) Do not bring valuables to the hospital. [facility] is not responsible for valuables/personal belongings. Put on clean/comfortable clothes.  Please brush your teeth.  Ask your nurse before applying any prescription medications to the skin.

## 2024-11-13 ENCOUNTER — Other Ambulatory Visit: Payer: Self-pay

## 2024-11-13 ENCOUNTER — Ambulatory Visit: Payer: Self-pay

## 2024-11-13 ENCOUNTER — Encounter (HOSPITAL_COMMUNITY): Payer: Self-pay

## 2024-11-13 ENCOUNTER — Encounter (HOSPITAL_COMMUNITY)
Admission: RE | Admit: 2024-11-13 | Discharge: 2024-11-13 | Disposition: A | Discharge status: Home / Self Care | Source: Ambulatory Visit

## 2024-11-13 VITALS — BP 125/67 | HR 91 | Temp 97.8°F | Resp 17 | Ht 61.0 in | Wt 166.8 lb

## 2024-11-13 DIAGNOSIS — Z01812 Encounter for preprocedural laboratory examination: Secondary | ICD-10-CM | POA: Insufficient documentation

## 2024-11-13 DIAGNOSIS — E039 Hypothyroidism, unspecified: Secondary | ICD-10-CM | POA: Insufficient documentation

## 2024-11-13 DIAGNOSIS — G4733 Obstructive sleep apnea (adult) (pediatric): Secondary | ICD-10-CM | POA: Insufficient documentation

## 2024-11-13 DIAGNOSIS — E785 Hyperlipidemia, unspecified: Secondary | ICD-10-CM | POA: Diagnosis not present

## 2024-11-13 DIAGNOSIS — I10 Essential (primary) hypertension: Secondary | ICD-10-CM | POA: Diagnosis not present

## 2024-11-13 DIAGNOSIS — M4856XA Collapsed vertebra, not elsewhere classified, lumbar region, initial encounter for fracture: Secondary | ICD-10-CM

## 2024-11-13 DIAGNOSIS — R569 Unspecified convulsions: Secondary | ICD-10-CM | POA: Diagnosis not present

## 2024-11-13 DIAGNOSIS — Z01818 Encounter for other preprocedural examination: Secondary | ICD-10-CM

## 2024-11-13 DIAGNOSIS — E119 Type 2 diabetes mellitus without complications: Secondary | ICD-10-CM | POA: Diagnosis not present

## 2024-11-13 HISTORY — DX: Malignant (primary) neoplasm, unspecified: C80.1

## 2024-11-13 HISTORY — DX: Presence of other cardiac implants and grafts: Z95.818

## 2024-11-13 LAB — CBC
HCT: 38.9 % (ref 36.0–46.0)
Hemoglobin: 12.5 g/dL (ref 12.0–15.0)
MCH: 28.5 pg (ref 26.0–34.0)
MCHC: 32.1 g/dL (ref 30.0–36.0)
MCV: 88.8 fL (ref 80.0–100.0)
Platelets: 429 K/uL — ABNORMAL HIGH (ref 150–400)
RBC: 4.38 MIL/uL (ref 3.87–5.11)
RDW: 13.2 % (ref 11.5–15.5)
WBC: 12.9 K/uL — ABNORMAL HIGH (ref 4.0–10.5)
nRBC: 0 % (ref 0.0–0.2)

## 2024-11-13 LAB — BASIC METABOLIC PANEL WITH GFR
Anion gap: 11 (ref 5–15)
BUN: 16 mg/dL (ref 8–23)
CO2: 28 mmol/L (ref 22–32)
Calcium: 12.5 mg/dL — ABNORMAL HIGH (ref 8.9–10.3)
Chloride: 99 mmol/L (ref 98–111)
Creatinine, Ser: 1.17 mg/dL — ABNORMAL HIGH (ref 0.44–1.00)
GFR, Estimated: 50 mL/min — ABNORMAL LOW (ref >60)
Glucose, Bld: 153 mg/dL — ABNORMAL HIGH (ref 70–99)
Potassium: 5 mmol/L (ref 3.5–5.1)
Sodium: 138 mmol/L (ref 135–145)

## 2024-11-13 LAB — SURGICAL PCR SCREEN
MRSA, PCR: NEGATIVE
Staphylococcus aureus: NEGATIVE

## 2024-11-13 NOTE — Progress Notes (Signed)
 PCP - Rockey Mangle, PA-C  Cardiologist - Hildegard Rinks, MD  Urology -Redell Lynwood Napoleon, MD   PPM/ICD - Loop recorder Device Orders - n/a Rep Notified - n/a  Chest x-ray - denies EKG - requested  Stress Test - denies ECHO - denies Cardiac Cath - denies  Sleep Study - 2 + years ago CPAP - n/a  Dm - denies  Blood Thinner Instructions:denies Aspirin Instructions: Last dose 11-10-24  ERAS Protcol - clear liquids until  4:30 am.  COVID TEST- n/a  Anesthesia review: Yes Htn, Loop recorder  Patient denies shortness of breath, fever, cough and chest pain at PAT appointment   All instructions explained to the patient, with a verbal understanding of the material. Patient agrees to go over the instructions while at home for a better understanding. Patient also instructed to self quarantine after being tested for COVID-19. The opportunity to ask questions was provided.

## 2024-11-14 NOTE — Anesthesia Preprocedure Evaluation (Addendum)
 Anesthesia Evaluation  Patient identified by MRN, date of birth, ID band Patient awake    Reviewed: Allergy & Precautions, H&P , NPO status , Patient's Chart, lab work & pertinent test results  History of Anesthesia Complications Negative for: history of anesthetic complications  Airway Mallampati: II  TM Distance: >3 FB Neck ROM: Full    Dental no notable dental hx.    Pulmonary neg pulmonary ROS   Pulmonary exam normal breath sounds clear to auscultation       Cardiovascular hypertension, (-) angina (-) Past MI Normal cardiovascular exam Rhythm:Regular Rate:Normal     Neuro/Psych Seizures -,  CVA, No Residual Symptoms  negative psych ROS   GI/Hepatic negative GI ROS, Neg liver ROS,neg GERD  ,,  Endo/Other  diabetesHypothyroidism    Renal/GU negative Renal ROS  negative genitourinary   Musculoskeletal  (+) Arthritis ,  L1  fx   Abdominal   Peds negative pediatric ROS (+)  Hematology Hg 12.5   Anesthesia Other Findings   Reproductive/Obstetrics negative OB ROS                              Anesthesia Physical Anesthesia Plan  ASA: 3  Anesthesia Plan: General   Post-op Pain Management:    Induction: Intravenous  PONV Risk Score and Plan: 3 and Ondansetron  and Dexamethasone   Airway Management Planned: Oral ETT  Additional Equipment: None  Intra-op Plan:   Post-operative Plan: Extubation in OR  Informed Consent: I have reviewed the patients History and Physical, chart, labs and discussed the procedure including the risks, benefits and alternatives for the proposed anesthesia with the patient or authorized representative who has indicated his/her understanding and acceptance.     Dental advisory given  Plan Discussed with: CRNA  Anesthesia Plan Comments: (PAT note by Lynwood Hope, PA-C: 73 year old female follows with cardiology Dr. Meldon at [facility] for history of HTN,  HLD, syncope/transient alteration of awareness s/p [employer] ILR with no significant findings to date.  Echo 10/2023 showed LVEF 60 to 65%, normal wall motion, normal RV, no significant valvular abnormalities.  Follows with neurologist Dr. Marcela at [facility] for history of OSA intolerant to CPAP, seizure-like activity, possible TIA, episodes of transient alteration of awareness.  Per notes, she has had 2 EEGs done; 1 normal in the second with abnormalities that prompted initiation of Keppra.  MRI showed a cerebellar stroke and a small meningioma.  Last seen in follow-up 06/04/2024.  Per note, 73 yo woman with multiple episodes, at least one of which appeared to have been a TIA (expressive aphasia for 45 minutes followed by complete resolution). She has evidence of at least one infarct on MRI. Risk factors include pre-diabetes, hypertension, and poorly controlled hyperlipidemia despite the PCSK9 inhibitor. She has other complaints that include brief episodes of slurred speech (not always detected by her husband), and forgetting words or names. Lastly, she has microvascular disease and poor balance but no falls. CTA head and neck just showed scattered atherosclerosis but no stenoses. No afib noted on her implanted loop monitor thus far. She is sleeping better s/p septoplasty, so perhaps poor sleep was contributing to her episodes of staring/unresponsiveness during the day. Recommend keeping her on Keppra for now.  Follows with endocrinology at [facility].  History of non-insulin-dependent DM2 and postablative hypothyroidism.  DM2 is well-controlled with dietary measures alone, A1c 6.3 on 05/21/2024.  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  She is scheduled for parathyroidectomy on 12/27/2024.  Follows with urology at [facility] for history of bladder cancer s/p TURBT, right URS, right 8 Fr ureteral stent for right hydronephrosis after previous TURBT  09/27/2024.  Preop labs reviewed, creatinine mildly elevated 1.17, moderate hypercalcemia with calcium 12.5 consistent with history of hyperparathyroidism, otherwise unremarkable.  EKG 9-25 (Care Everywhere, tracing requested): Sinus rhythm with sinus arrhythmia.  Rate 66.  TTE 11/10/2023 (Care Everywhere): SUMMARY  LV ejection fraction = 60-65%.  The left ventricular wall motion is normal.  Left ventricular filling pattern is prolonged relaxation.  The right ventricle is normal in size and function.  There is no significant valvular stenosis or regurgitation.  IVC size was normal.  There is no pericardial effusion.  Injection of agitated saline showed no right-to-left shunt.   )         Anesthesia Quick Evaluation

## 2024-11-14 NOTE — Progress Notes (Signed)
 Anesthesia Chart Review:  73 year old female follows with cardiology Dr. Meldon at [facility] for history of HTN, HLD, syncope/transient alteration of awareness s/p [employer] ILR with no significant findings to date.  Echo 10/2023 showed LVEF 60 to 65%, normal wall motion, normal RV, no significant valvular abnormalities.  Follows with neurologist Dr. Marcela at [facility] for history of OSA intolerant to CPAP, seizure-like activity, possible TIA, episodes of transient alteration of awareness.  Per notes, she has had 2 EEGs done; 1 normal in the second with abnormalities that prompted initiation of Keppra.  MRI showed a cerebellar stroke and a small meningioma.  Last seen in follow-up 06/04/2024.  Per note, 73 yo woman with multiple episodes, at least one of which appeared to have been a TIA (expressive aphasia for 45 minutes followed by complete resolution). She has evidence of at least one infarct on MRI. Risk factors include pre-diabetes, hypertension, and poorly controlled hyperlipidemia despite the PCSK9 inhibitor. She has other complaints that include brief episodes of slurred speech (not always detected by her husband), and forgetting words or names. Lastly, she has microvascular disease and poor balance but no falls. CTA head and neck just showed scattered atherosclerosis but no stenoses. No afib noted on her implanted loop monitor thus far. She is sleeping better s/p septoplasty, so perhaps poor sleep was contributing to her episodes of staring/unresponsiveness during the day. Recommend keeping her on Keppra for now.  Follows with endocrinology at [facility].  History of non-insulin-dependent DM2 and postablative hypothyroidism.  DM2 is well-controlled with dietary measures alone, A1c 6.3 on 05/21/2024.  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  She is scheduled for parathyroidectomy on 12/27/2024.  Follows with urology at [facility] for history of bladder  cancer s/p TURBT, right URS, right 8 Fr ureteral stent for right hydronephrosis after previous TURBT 09/27/2024.  Preop labs reviewed, creatinine mildly elevated 1.17, moderate hypercalcemia with calcium 12.5 consistent with history of hyperparathyroidism, otherwise unremarkable.  EKG 9-25 (Care Everywhere, tracing requested): Sinus rhythm with sinus arrhythmia.  Rate 66.  TTE 11/10/2023 (Care Everywhere): SUMMARY  LV ejection fraction = 60-65%.  The left ventricular wall motion is normal.  Left ventricular filling pattern is prolonged relaxation.  The right ventricle is normal in size and function.  There is no significant valvular stenosis or regurgitation.  IVC size was normal.  There is no pericardial effusion.  Injection of agitated saline showed no right-to-left shunt.     Lynwood Geofm RIGGERS [facility] Short Stay Center/Anesthesiology Phone [phone] 11/14/2024 3:28 PM

## 2024-11-19 ENCOUNTER — Encounter (HOSPITAL_COMMUNITY): Payer: Self-pay

## 2024-11-19 ENCOUNTER — Encounter (HOSPITAL_COMMUNITY): Admission: RE | Disposition: A | Discharge status: Home / Self Care | Payer: Self-pay | Source: Home / Self Care

## 2024-11-19 ENCOUNTER — Ambulatory Visit (HOSPITAL_COMMUNITY)

## 2024-11-19 ENCOUNTER — Ambulatory Visit (HOSPITAL_COMMUNITY): Payer: Self-pay | Admitting: Physician Assistant

## 2024-11-19 ENCOUNTER — Ambulatory Visit (HOSPITAL_COMMUNITY): Admission: RE | Admit: 2024-11-19 | Discharge: 2024-11-19 | Disposition: A | Discharge status: Home / Self Care

## 2024-11-19 ENCOUNTER — Other Ambulatory Visit: Payer: Self-pay

## 2024-11-19 DIAGNOSIS — E119 Type 2 diabetes mellitus without complications: Secondary | ICD-10-CM

## 2024-11-19 DIAGNOSIS — E039 Hypothyroidism, unspecified: Secondary | ICD-10-CM | POA: Diagnosis not present

## 2024-11-19 DIAGNOSIS — M4856XA Collapsed vertebra, not elsewhere classified, lumbar region, initial encounter for fracture: Secondary | ICD-10-CM

## 2024-11-19 DIAGNOSIS — M8088XA Other osteoporosis with current pathological fracture, vertebra(e), initial encounter for fracture: Secondary | ICD-10-CM | POA: Diagnosis not present

## 2024-11-19 DIAGNOSIS — M1990 Unspecified osteoarthritis, unspecified site: Secondary | ICD-10-CM | POA: Diagnosis not present

## 2024-11-19 DIAGNOSIS — Z8673 Personal history of transient ischemic attack (TIA), and cerebral infarction without residual deficits: Secondary | ICD-10-CM | POA: Insufficient documentation

## 2024-11-19 DIAGNOSIS — M8008XA Age-related osteoporosis with current pathological fracture, vertebra(e), initial encounter for fracture: Secondary | ICD-10-CM | POA: Diagnosis present

## 2024-11-19 DIAGNOSIS — I10 Essential (primary) hypertension: Secondary | ICD-10-CM | POA: Insufficient documentation

## 2024-11-19 DIAGNOSIS — R569 Unspecified convulsions: Secondary | ICD-10-CM | POA: Diagnosis not present

## 2024-11-19 HISTORY — DX: Type 2 diabetes mellitus without complications: E11.9

## 2024-11-19 HISTORY — PX: KYPHOPLASTY: SHX5884

## 2024-11-19 HISTORY — DX: Cerebral infarction, unspecified: I63.9

## 2024-11-19 LAB — GLUCOSE, CAPILLARY
Glucose-Capillary: 137 mg/dL — ABNORMAL HIGH (ref 70–99)
Glucose-Capillary: 132 mg/dL — ABNORMAL HIGH (ref 70–99)

## 2024-11-19 LAB — TYPE AND SCREEN
ABO/RH(D): A POS
Antibody Screen: NEGATIVE

## 2024-11-19 SURGERY — KYPHOPLASTY
Anesthesia: General | Site: Back

## 2024-11-19 MED ORDER — BUPIVACAINE-EPINEPHRINE (PF) 0.25% -1:200000 IJ SOLN
INTRAMUSCULAR | Status: AC
  Filled 2024-11-19: qty 30

## 2024-11-19 MED ORDER — GLYCOPYRROLATE 0.2 MG/ML IJ SOLN
INTRAMUSCULAR | Status: DC | PRN
  Administered 2024-11-19: .2 mg via INTRAVENOUS

## 2024-11-19 MED ORDER — ORAL CARE MOUTH RINSE: 15.0000 mL | Freq: Once | OROMUCOSAL | Status: AC

## 2024-11-19 MED ORDER — FENTANYL CITRATE (PF) 100 MCG/2ML IJ SOLN
25.0000 ug | INTRAMUSCULAR | Status: DC | PRN
  Administered 2024-11-19: 50 ug via INTRAVENOUS

## 2024-11-19 MED ORDER — CEFAZOLIN SODIUM-DEXTROSE 2-4 GM/100ML-% IV SOLN
2.0000 g | INTRAVENOUS | Status: AC
  Administered 2024-11-19: 2 g via INTRAVENOUS
  Filled 2024-11-19: qty 100

## 2024-11-19 MED ORDER — MIDAZOLAM HCL (PF) 2 MG/2ML IJ SOLN
INTRAMUSCULAR | Status: DC | PRN
  Administered 2024-11-19: 2 mg via INTRAVENOUS

## 2024-11-19 MED ORDER — PHENYLEPHRINE 80 MCG/ML (10ML) SYRINGE FOR IV PUSH (FOR BLOOD PRESSURE SUPPORT)
PREFILLED_SYRINGE | INTRAVENOUS | Status: AC
  Filled 2024-11-19: qty 10

## 2024-11-19 MED ORDER — PHENYLEPHRINE HCL-NACL 20-0.9 MG/250ML-% IV SOLN
INTRAVENOUS | Status: DC | PRN
Start: 2024-11-19 — End: 2024-11-19
  Administered 2024-11-19: 20 ug/min via INTRAVENOUS

## 2024-11-19 MED ORDER — PROPOFOL 10 MG/ML IV BOLUS
INTRAVENOUS | Status: AC
  Filled 2024-11-19: qty 20

## 2024-11-19 MED ORDER — FENTANYL CITRATE (PF) 100 MCG/2ML IJ SOLN
INTRAMUSCULAR | Status: AC
  Filled 2024-11-19: qty 2

## 2024-11-19 MED ORDER — DEXAMETHASONE SOD PHOSPHATE PF 10 MG/ML IJ SOLN
INTRAMUSCULAR | Status: DC | PRN
  Administered 2024-11-19: 5 mg via INTRAVENOUS

## 2024-11-19 MED ORDER — ONDANSETRON HCL 4 MG/2ML IJ SOLN
INTRAMUSCULAR | Status: AC
Start: 2024-11-19 — End: 2024-11-19
  Filled 2024-11-19: qty 2

## 2024-11-19 MED ORDER — SUGAMMADEX SODIUM 200 MG/2ML IV SOLN
INTRAVENOUS | Status: DC | PRN
  Administered 2024-11-19: 200 mg via INTRAVENOUS

## 2024-11-19 MED ORDER — ROCURONIUM BROMIDE 10 MG/ML (PF) SYRINGE
PREFILLED_SYRINGE | INTRAVENOUS | Status: AC
Start: 2024-11-19 — End: 2024-11-19
  Filled 2024-11-19: qty 10

## 2024-11-19 MED ORDER — DROPERIDOL 2.5 MG/ML IJ SOLN
0.6250 mg | Freq: Once | INTRAMUSCULAR | Status: AC | PRN
  Administered 2024-11-19: 0.625 mg via INTRAVENOUS

## 2024-11-19 MED ORDER — BUPIVACAINE-EPINEPHRINE (PF) 0.25% -1:200000 IJ SOLN
INTRAMUSCULAR | Status: DC | PRN
  Administered 2024-11-19: 10 mL via PERINEURAL

## 2024-11-19 MED ORDER — ROCURONIUM BROMIDE 10 MG/ML (PF) SYRINGE
PREFILLED_SYRINGE | INTRAVENOUS | Status: DC | PRN
  Administered 2024-11-19: 50 mg via INTRAVENOUS

## 2024-11-19 MED ORDER — 0.9 % SODIUM CHLORIDE (POUR BTL) OPTIME
TOPICAL | Status: DC | PRN
  Administered 2024-11-19: 1000 mL

## 2024-11-19 MED ORDER — LIDOCAINE 2% (20 MG/ML) 5 ML SYRINGE
INTRAMUSCULAR | Status: AC
  Filled 2024-11-19: qty 5

## 2024-11-19 MED ORDER — CHLORHEXIDINE GLUCONATE 0.12 % MT SOLN
15.0000 mL | Freq: Once | OROMUCOSAL | Status: AC
  Administered 2024-11-19: 15 mL via OROMUCOSAL
  Filled 2024-11-19: qty 15

## 2024-11-19 MED ORDER — EPHEDRINE SULFATE-NACL 50-0.9 MG/10ML-% IV SOSY
PREFILLED_SYRINGE | INTRAVENOUS | Status: DC | PRN
  Administered 2024-11-19: 5 mg via INTRAVENOUS

## 2024-11-19 MED ORDER — ONDANSETRON HCL 4 MG/2ML IJ SOLN
INTRAMUSCULAR | Status: DC | PRN
  Administered 2024-11-19: 4 mg via INTRAVENOUS

## 2024-11-19 MED ORDER — LIDOCAINE HCL (PF) 1 % IJ SOLN
INTRAMUSCULAR | Status: DC | PRN
  Administered 2024-11-19: 10 mL

## 2024-11-19 MED ORDER — LIDOCAINE 2% (20 MG/ML) 5 ML SYRINGE
INTRAMUSCULAR | Status: DC | PRN
  Administered 2024-11-19: 60 mg via INTRAVENOUS

## 2024-11-19 MED ORDER — PHENYLEPHRINE 80 MCG/ML (10ML) SYRINGE FOR IV PUSH (FOR BLOOD PRESSURE SUPPORT)
PREFILLED_SYRINGE | INTRAVENOUS | Status: DC | PRN
  Administered 2024-11-19: 240 ug via INTRAVENOUS
  Administered 2024-11-19 (×2): 160 ug via INTRAVENOUS

## 2024-11-19 MED ORDER — PROPOFOL 10 MG/ML IV BOLUS
INTRAVENOUS | Status: DC | PRN
  Administered 2024-11-19: 100 mg via INTRAVENOUS

## 2024-11-19 MED ORDER — IOPAMIDOL (ISOVUE-300) INJECTION 61%
INTRAVENOUS | Status: DC | PRN
  Administered 2024-11-19: 10 mL

## 2024-11-19 MED ORDER — BACITRACIN ZINC 500 UNIT/GM EX OINT
TOPICAL_OINTMENT | CUTANEOUS | Status: AC
  Filled 2024-11-19: qty 28.35

## 2024-11-19 MED ORDER — EPHEDRINE 5 MG/ML INJ
INTRAVENOUS | Status: AC
  Filled 2024-11-19: qty 5

## 2024-11-19 MED ORDER — LACTATED RINGERS IV SOLN: INTRAVENOUS | Status: DC

## 2024-11-19 MED ORDER — MIDAZOLAM HCL 2 MG/2ML IJ SOLN
INTRAMUSCULAR | Status: AC
  Filled 2024-11-19: qty 2

## 2024-11-19 MED ORDER — FENTANYL CITRATE (PF) 250 MCG/5ML IJ SOLN
INTRAMUSCULAR | Status: DC | PRN
  Administered 2024-11-19: 75 ug via INTRAVENOUS
  Administered 2024-11-19: 25 ug via INTRAVENOUS

## 2024-11-19 MED ORDER — DROPERIDOL 2.5 MG/ML IJ SOLN
INTRAMUSCULAR | Status: AC
  Filled 2024-11-19: qty 2

## 2024-11-19 SURGICAL SUPPLY — 38 items
ALCOHOL 70% 16 OZ (MISCELLANEOUS) ×1 IMPLANT
BAG COUNTER SPONGE SURGICOUNT (BAG) ×1 IMPLANT
BLADE SURG 15 STRL LF DISP TIS (BLADE) ×1 IMPLANT
BNDG ADH 1X3 SHEER STRL LF (GAUZE/BANDAGES/DRESSINGS) ×1 IMPLANT
CEMENT KYPHON C01A KIT/MIXER (Cement) IMPLANT
CHLORAPREP W/TINT 26 (MISCELLANEOUS) ×1 IMPLANT
COVER MAYO STAND STRL (DRAPES) ×1 IMPLANT
COVER SURGICAL LIGHT HANDLE (MISCELLANEOUS) ×1 IMPLANT
DERMABOND ADVANCED .7 DNX12 (GAUZE/BANDAGES/DRESSINGS) IMPLANT
DERMABOND ADVANCED .7 DNX6 (GAUZE/BANDAGES/DRESSINGS) ×1 IMPLANT
DRAPE C-ARM 42X72 X-RAY (DRAPES) ×1 IMPLANT
DRAPE HALF SHEET 40X57 (DRAPES) IMPLANT
DRAPE INCISE IOBAN 66X45 STRL (DRAPES) ×1 IMPLANT
DRAPE LAPAROTOMY T 102X78X121 (DRAPES) ×1 IMPLANT
DRAPE SURG 17X23 STRL (DRAPES) ×4 IMPLANT
DRAPE WARM FLUID 44X44 (DRAPES) ×1 IMPLANT
GAUZE 4X4 16PLY ~~LOC~~+RFID DBL (SPONGE) ×1 IMPLANT
GAUZE SPONGE 2X2 8PLY STRL LF (GAUZE/BANDAGES/DRESSINGS) ×1 IMPLANT
GAUZE SPONGE 4X4 12PLY STRL (GAUZE/BANDAGES/DRESSINGS) ×1 IMPLANT
GLOVE SURG SYN 8.0 PF PI (GLOVE) ×2 IMPLANT
GOWN STRL REUS W/ TWL LRG LVL3 (GOWN DISPOSABLE) ×2 IMPLANT
GOWN STRL REUS W/ TWL XL LVL3 (GOWN DISPOSABLE) ×1 IMPLANT
KIT BASIN OR (CUSTOM PROCEDURE TRAY) ×1 IMPLANT
KIT TURNOVER KIT B (KITS) ×1 IMPLANT
NDL 22X1.5 STRL (OR ONLY) (MISCELLANEOUS) IMPLANT
NDL HYPO 25X1 1.5 SAFETY (NEEDLE) ×1 IMPLANT
NDL SPNL 18GX3.5 QUINCKE PK (NEEDLE) ×2 IMPLANT
PACK UNIVERSAL I (CUSTOM PROCEDURE TRAY) ×1 IMPLANT
PAD ARMBOARD POSITIONER FOAM (MISCELLANEOUS) ×2 IMPLANT
SOLN 0.9% NACL POUR BTL 1000ML (IV SOLUTION) ×1 IMPLANT
SOLN STERILE WATER BTL 1000 ML (IV SOLUTION) ×1 IMPLANT
SUT MNCRL AB 4-0 PS2 18 (SUTURE) ×1 IMPLANT
SYR BULB IRRIG 60ML STRL (SYRINGE) ×1 IMPLANT
SYR CONTROL 10ML LL (SYRINGE) ×1 IMPLANT
TOWEL GREEN STERILE (TOWEL DISPOSABLE) ×1 IMPLANT
TOWEL GREEN STERILE FF (TOWEL DISPOSABLE) ×1 IMPLANT
TRAY KYPHOPAK 15/3 ONESTEP 1ST (MISCELLANEOUS) IMPLANT
TRAY KYPHOPAK 20/3 ONESTEP 1ST (MISCELLANEOUS) IMPLANT

## 2024-11-19 NOTE — Discharge Summary (Signed)
 The patient was noted with history of osteoporotic compression fractures.  She was seen in the preoperative holding area and then taken the operative the same day for kyphoplasty.  Please see the operative note for details on the surgical procedure.  Following the kyphoplasty, she was transferred to the cart room and discharged home.

## 2024-11-19 NOTE — H&P (Signed)
 Orthopaedic Service H&P/Consult     Patient ID: [name] MRN: [id number] DOB/AGE: 72-02-1951 72 y.o.  Chief Complaint: Back pain HPI: [name] is an 73 y.o. female.  With this history of back pain felt related to acute compression fractures.  MRI scan demonstrated acute compression of L1, L3 and L4 with an old fracture at L5.  The patient severe pain.  She has failed extensive conservative care including activity modification bracing and pain medications.  These fractures were felt to be related to osteoporosis.  Given the failure conservative care, we will proceed with kyphoplasty  Past Medical History:  Diagnosis Date   Arthritis    Bilateral in the Knees and fingers (04/22/2015)   Cancer (HCC)    Chickenpox    Complication of anesthesia    hard to wake up:   Diabetes mellitus without complication (HCC)    Environmental allergies    Family history of adverse reaction to anesthesia    sister & mom are hard to wake up:   Food allergy    Peanuts, eggs, shrimp/shellfish, wheat, soy   Genital warts    Heart murmur    HTN (hypertension)    Hyperlipidemia    Hypothyroidism    Implantable loop recorder present    Stroke (HCC)    Thyroid  disease    Thyroid  nodule     Past Surgical History:  Procedure Laterality Date   CARPAL TUNNEL RELEASE Bilateral 1990's   CYSTECTOMY     From the cervix   FRACTURE SURGERY     INTRAMEDULLARY (IM) NAIL INTERTROCHANTERIC Left 12/15/2014   Procedure: INTRAMEDULLARY (IM) NAIL INTERTROCHANTRIC;  Surgeon: Elspeth JONELLE Her, MD;  Location: [facility] ORS;  Service: Orthopedics;  Laterality: Left;   IR KYPHO LUMBAR INC FX REDUCE BONE BX UNI/BIL CANNULATION INC/IMAGING  05/22/2024   OTHER SURGICAL HISTORY Left 12/2016   Bone spur on foot   TOTAL HIP ARTHROPLASTY Left 04/24/2015   Procedure:  TOTAL HIP ARTHROPLASTY AND REMOVAL OF IM AFFIXUS NAIL ;  Surgeon: Donnice Car, MD;  Location: [facility] OR;  Service: Orthopedics;  Laterality:  Left;    Family History  Problem Relation Age of Onset   Arthritis Mother    Hyperlipidemia Mother    High blood pressure Mother    Diabetes Mother    Myasthenia gravis Mother    Heart disease Father    Stroke Father        x's 2   High blood pressure Father    Diabetes Maternal Grandmother    Heart disease Paternal Grandmother    Heart disease Paternal Grandfather    Social History:  reports that she has never smoked. She has never used smokeless tobacco. She reports current alcohol use. She reports that she does not use drugs.  Allergies:  Allergies  Allergen Reactions   Alirocumab Itching and Swelling    Big red spot and itching after injection.   Gluten Meal    Peanut-Containing Drug Products Diarrhea   Soy Allergy (Obsolete)    Statins Diarrhea   Egg Protein-Containing Drug Products Diarrhea and Rash   Shrimp [Shellfish Allergy] Rash    Medications Prior to Admission  Medication Sig Dispense Refill   acetaminophen  (TYLENOL ) 325 MG tablet Take 2 tablets (650 mg total) by mouth every 6 (six) hours as needed. 60 tablet 1   amLODipine (NORVASC) 10 MG tablet Take 10 mg by mouth daily.     aspirin EC 325 MG tablet Take 325 mg by mouth daily.  azelastine  (ASTELIN ) 0.1 % nasal spray Place 1 spray into both nostrils 2 (two) times daily. Use in each nostril as directed 90 mL 3   Cholecalciferol  (VITAMIN D ) 50 MCG (2000 UT) tablet Take 2,000 Units by mouth daily.     estradiol (ESTRACE) 0.01 % CREA vaginal cream Place 1 Applicatorful vaginally 3 (three) times a week.     Evolocumab (REPATHA SURECLICK) 140 MG/ML SOAJ Inject 140 mg into the skin every 14 (fourteen) days.     folic acid (FOLVITE) 1 MG tablet Take 1 mg by mouth daily.     levETIRAcetam (KEPPRA) 500 MG tablet Take 500 mg by mouth 2 (two) times daily.     levothyroxine  (SYNTHROID , LEVOTHROID) 100 MCG tablet Take 100 mcg by mouth See admin instructions. Take 100 mcg by mouth daily except on Saturday take 50 mcg      mirabegron ER (MYRBETRIQ) 25 MG TB24 tablet Take 25 mg by mouth daily.     morphine  (MSIR) 15 MG tablet Take 15 mg by mouth every 6 (six) hours as needed for moderate pain (pain score 4-6).     Romosozumab-aqqg (EVENITY) 105 MG/1. SOSY injection Inject 210 mg into the skin every 30 (thirty) days.     telmisartan (MICARDIS) 80 MG tablet Take 80 mg by mouth daily.     benzonatate  (TESSALON ) 100 MG capsule Take 1 capsule (100 mg total) by mouth every 8 (eight) hours. (Patient not taking: Reported on 11/11/2024) 21 capsule 0   COVID-19 mRNA bivalent vaccine, Pfizer, injection Inject into the muscle. (Patient not taking: Reported on 11/11/2024) 0.3 mL 0   COVID-19 mRNA vaccine, Moderna, 100 MCG/0.5ML injection Inject into the muscle. (Patient not taking: Reported on 11/11/2024) 0.25 mL 0   diclofenac  sodium (VOLTAREN ) 1 % GEL Apply 2 g topically 4 (four) times daily. (Patient not taking: Reported on 11/11/2024) 1 Tube 1   EPINEPHrine  0.3 mg/0.3 mL IJ SOAJ injection Use as directed for life-threatening allergic reaction. (Patient not taking: Reported on 11/11/2024) 2 Device 3   fluticasone  (FLONASE ) 50 MCG/ACT nasal spray Place 2 sprays into both nostrils daily. (Patient not taking: Reported on 11/11/2024) 48 g 3   influenza vaccine adjuvanted (FLUAD) 0.5 ML injection Inject into the muscle. (Patient not taking: Reported on 11/11/2024) 0.5 mL 0   montelukast  (SINGULAIR ) 10 MG tablet Take 1 tablet (10 mg total) by mouth at bedtime. (Patient not taking: Reported on 11/11/2024) 30 tablet 1   valsartan  (DIOVAN ) 320 MG tablet TAKE 1 TABLET DAILY (Patient not taking: Reported on 11/11/2024) 90 tablet 3    Results for orders placed or performed during the hospital encounter of 11/19/24 (from the past 48 hours)  Glucose, capillary     Status: Abnormal   Collection Time: 11/19/24  5:55 AM  Result Value Ref Range   Glucose-Capillary 137 (H) 70 - 99 mg/dL    Comment: Glucose reference range applies only to  samples taken after fasting for at least 8 hours.   Comment 1 Notify RN   Type and screen     Status: None (Preliminary result)   Collection Time: 11/19/24  6:12 AM  Result Value Ref Range   ABO/RH(D) PENDING    Antibody Screen PENDING    Sample Expiration      11/22/2024,2359 Performed at [facility], [address], [place], [place] [address]    No results found.    Blood pressure (!) 146/71, pulse 80, temperature 98.2 F (36.8 C), resp. rate 18, height  5' 1 (1.549 m), weight 73.9 kg, SpO2 97%.  Patient is resting comfortably in bed No acute distress Alert and oriented Mood is calm Heart rate is regular on the monitor Breathing comfortably room air No abdominal distention Neurovascular intact    Assessment/Plan  Acute osteoporotic compression fractures of L1, L3, and L4  Patient does have mild multiple osteoporotic compression fractures failed conservative care.  Will previous kyphoplasty today as previously discussed   Cordella Rhein, MD, MS [facility] [phone]

## 2024-11-19 NOTE — Transfer of Care (Signed)
 Immediate Anesthesia Transfer of Care Note  Patient: [name]  Procedure(s) Performed: KYPHOPLASTY OF LUMBAR ONE, THREE, AND FOUR (Back)  Patient Location: PACU  Anesthesia Type:General  Level of Consciousness: drowsy  Airway & Oxygen Therapy: Patient Spontanous Breathing and Patient connected to face mask oxygen  Post-op Assessment: Report given to RN and Post -op Vital signs reviewed and stable  Post vital signs: Reviewed and stable  Last Vitals:  Vitals Value Taken Time  BP 126/63 11/19/24 08:45  Temp    Pulse 99 11/19/24 08:47  Resp 26 11/19/24 08:47  SpO2 94 % 11/19/24 08:47  Vitals shown include unfiled device data.  Last Pain:  Vitals:   11/19/24 0608  PainSc: 10-Worst pain ever      Patients Stated Pain Goal: 2 (11/19/24 9391)  Complications: No notable events documented.

## 2024-11-19 NOTE — Anesthesia Procedure Notes (Signed)
 Procedure Name: Intubation Date/Time: 11/19/2024 7:53 AM  Performed by: Elby Raelene SAUNDERS, CRNAPre-anesthesia Checklist: Patient identified, Emergency Drugs available, Suction available and Patient being monitored Patient Re-evaluated:Patient Re-evaluated prior to induction Oxygen Delivery Method: Circle System Utilized Preoxygenation: Pre-oxygenation with 100% oxygen Induction Type: IV induction Ventilation: Mask ventilation without difficulty Laryngoscope Size: Miller and 2 Grade View: Grade I Tube type: Oral Tube size: 7.0 mm Number of attempts: 1 Airway Equipment and Method: Stylet and Bite block Placement Confirmation: ETT inserted through vocal cords under direct vision, positive ETCO2 and breath sounds checked- equal and bilateral Secured at: 22 cm Tube secured with: Tape Dental Injury: Teeth and Oropharynx as per pre-operative assessment

## 2024-11-19 NOTE — Op Note (Signed)
 PATIENT NAME: [name] [facility]   MEDICAL RECORD NO.:   [id number]    DATE OF BIRTH: DOB@    DATE OF PROCEDURE: TD@                               OPERATIVE REPORT   PREOPERATIVE DIAGNOSIS:  Osteoporotic L1, L3, and L4 compression fracture (M80.88XA)  POSTOPERATIVE DIAGNOSIS: L1, L3, and L4 osteoporotic compression fracture (M80.88XA)  PROCEDURE:  L1, L3, and L4  kyphoplasty.  SURGEON:  Cordella Rhein, MD, MS  ANESTHESIA: General Endotracheal.  COMPLICATIONS:  None.  DISPOSITION:  Stable.  ESTIMATED BLOOD LOSS:  Minimal.  INDICATIONS FOR SURGERY:   [name] is a very pleasant 73 y.o.- year-old , who did have an onset of severe pain in the back.  Her pain was rather severe.  The patient's imaging studies did clearly reveal L1, L3, and L4 osteoporotic compression fractures.  This was felt to be related to osteoporosis.   We did attempt a trial of nonoperative treatment, but the patient continued to feel rather debilitated as a result of her ongoing pain.  Given her ongoing pain and dysfunction, we did discuss proceeding with the procedure noted above.  I did fully discuss the procedure with the patient, and she did wish to proceed.  The risks include but are not limited to bleeding, infection, injury to the nerves, cement extravasation, additional fractures, risks of anesthesia, failure to resolve the pain and need for additional surgery.  OPERATIVE DETAILS:  On 11/19/24, the patient was brought to surgery and general endotracheal anesthesia was administered.  The patient was placed prone on a well-padded flat OR bed with gel rolls placed under the patient's chest and hips.  Antibiotics were given.  AP and lateral fluoroscopy was brought into the field.  After a time-out procedure was performed, the area just lateral to the pedicles was anesthetized with lidocaine  and marcaine .  Two small stan incisions were made with a 10 blade next each level.  Jamshidi needeles were advanced  through the pedicles with care not to breach the medial wall of the pedicle under entering the vertebral body on the lateral view.   This was done on both the right and left sides. Drills were placed through the Jamshidis.  Balloons were placed and inflated until adequate fill was achieved.  The balloons were deflated then removed.  At this point, after the cement was mixed and injected.  Excellent interdigitation of cement was identified.  No abnormal extravasation was noted. The cement was then allowed to harden, after which point the Jamshidis were removed.  Final fluoroscopic images demonstrate acceptable addition of kyphoplasty's at L1, L3, and L4.  The wound was then irrigated and closed using 3-0 Monocryl.  And dermabond.  Bandaids were placed,   The patient was then awoken from general endotracheal anesthesia and transferred to recovery in stable condition.  Cordella Rhein, MD, MS [facility] Specialist / [facility] [phone]

## 2024-11-19 NOTE — Discharge Instructions (Signed)
 DISCHARGE INSTRUCTIONS KYPHOPLASTY       INCISION  Please make sure your incisions are checked at least twice daily for signs and symptoms of infection: If any of the below should occur, please call the office. Drainage from incisional site Opening of incisions Fevers greater than 101.3 Flu-like symptoms Increased redness and/or tenderness   If you have staples or sutures (not tape) in your incision they may be removed 2 weeks following  your surgery.  This is usually done in the office.  SHOWERING You may shower 2 days after your surgery.  The band aids can be removed and then replaced after you shower.  Hair washing is permissible while in the shower.  No tub baths, hot tubs or whirlpools until seen in the office.     EXERCISE Lift objects weighing less than 10-15 lbs Limit your sitting to 20-30 minute intervals.  You should lie down or walk in between sitting periods.  There are no limitations for sitting in a recliner chair. Walk as much as possible-let discomfort be your guide.  You may also go up and down stairs as much as you can tolerate.  Walking outside (as long as it is nice weather) or walking on a treadmill is permitted (no incline).    PAIN Over-the-counter Extra Strength Tylenol , non-steroidal anti-inflammatory medications (NSAIDs) such as Advil , Aleve or Motrin  should be able to help with any residual pain.    DRIVING  You may not drive a car until told otherwise by your physician (usually at your first office visit).   You may be a passenger for short distances (20-30 minutes).       FOLLOW-UP APPOINTMENTS A follow up appointment should already have been scheduled.  If it wasn't or if you have any questions, please CALL [phone]   Cordella Rhein, MD, MS [facility] / [place] [phone]

## 2024-11-19 NOTE — Anesthesia Postprocedure Evaluation (Signed)
 Anesthesia Post Note  Patient: [name]  Procedure(s) Performed: KYPHOPLASTY OF LUMBAR ONE, THREE, AND FOUR (Back)     Patient location during evaluation: PACU Anesthesia Type: General Level of consciousness: awake and alert Pain management: pain level controlled Vital Signs Assessment: post-procedure vital signs reviewed and stable Respiratory status: spontaneous breathing, nonlabored ventilation, respiratory function stable and patient connected to nasal cannula oxygen Cardiovascular status: blood pressure returned to baseline and stable Postop Assessment: no apparent nausea or vomiting Anesthetic complications: no   No notable events documented.  Last Vitals:  Vitals:   11/19/24 0900 11/19/24 0915  BP: (!) 147/75 (!) 151/75  Pulse: (!) 104 (!) 101  Resp: 18 20  Temp:  36.6 C  SpO2: 95% 96%    Last Pain:  Vitals:   11/19/24 0858  PainSc: 6                  Thom JONELLE Peoples

## 2024-11-20 ENCOUNTER — Encounter (HOSPITAL_COMMUNITY): Payer: Self-pay

## 2025-01-17 NOTE — Progress Notes (Signed)
 Assessment:  Encounter Diagnoses  Name Primary?   Malignant neoplasm of overlapping sites of bladder    (CMD) Yes   Other hydronephrosis       Plan: 1.  BCG 6/6 today 2.  Plan for cystoscopy, right retrograde pyelogram, right ureteral stent removal versus TRIA exchange, possible transurethral section of bladder tumor in 4 to 6 weeks 3.  Continue Myrbetriq   Referring Physician:  No referring provider defined for this encounter.  PCP:  Rockey Mangle, PA-C  No chief complaint on file.   HPI:  74 year old female with history of gross hematuria and recurrent UTIs presents follow-up.  Has had multiple BCG is held for suspicious urinalysis with eventual negative urine culture.  Due for BCG 6/6.  s/p TURBT, right URS, right 8 Fr ureteral stent for right hydronephrosis after previous TURBT.  Pathology negative.  Multiple ESBL E. coli UTIs (sensitive only to ertapenem, gentamicin, meropenem, Augmentin).   Has significant irritative symptoms, dysuria, gross hematuria.  Recently finished Augmentin.  No nephrolithiasis or smoking history.  No gross hematuria/recurrent UTIs prior to 8/24.  No history of breast or endometrial cancer.  PVR is 3.  CT without stones.  Cystoscopy with below bladder tumors.  Pelvic exam with vaginal atrophy.  On vaginal estrogen cream.  Stopped lithotomy due to OAB symptoms while on med.    Urinary urgency with UUI is worsening.  Has ureteral stent though UUI was present prior.  Oxybutynin XL did not help.  On Myrbetriq.  PVR is 6.  Bladder Cancer History: s/p TURBT (6/25) pT1 HG UC - focal invasion Muscle present and not involved Repeat TURBT (7/25) Small foci of CIS No invasive carcinoma  Developed right hydronephrosis after TURBT.  Right UO was completely obliterated during TURBT.  Now s/p right antegrade ureteral stent placement.  Right UO wide open after repeat TURBT in 7/25.  Developed recurrent right hydronephrosis down to UVJ.  Right URS benign.  S/p  8 Fr x 22 cm stent.     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 2022   Arthritis    Bladder cancer    (CMD) 05/01/2024   Carotid artery occlusion 11 15 2024   Cerebral meningioma    (CMD) 11/30/2022   CTS (carpal tunnel syndrome) 1996   Surgery both wrists   Degeneration of lumbar intervertebral disc 08/07/2020   Delayed emergence from general anesthesia    Environmental allergies 07/16/2013   Epilepsy    (CMD)    ETD (Eustachian tube dysfunction), right 05/17/2021   Familial hypercholesterolemia 03/02/2016   Fractures 12/16/13   femur  compressed fracture back   Gluten intolerance 04/27/2015   History of fall since date above   instability   History of fractured kneecap    HL (hearing loss) 12/06/2022   To be explained   HTN (hypertension) 07/16/2013   Hyperlipidemia    Hypothyroidism    Lumbar radiculopathy 06/08/2021   Lumbar spondylosis 01/10/2018   Motion sickness since childhood   Obesity    PVD (posterior vitreous detachment), bilateral 02/15/2019   Rectal bleeding 05/30/2016   Remote history of stroke 08/28/2022   chronic infarct in the right corona radiata on MRI 08/28/2022  Retinal break of right eye 01/15/2019   Dr. Anselm Fairly   Retinal tear of left eye 01/15/2019   Dr. Anselm Fairly   Retinal tear of left eye 2017   Seasonal allergic rhinitis 12/28/2022   Seizure    (CMD) 11/30/2022   Seizure-like activity    (CMD) 09/21/2022   Senile nuclear sclerosis, bilateral 02/15/2019   Dr. Adina Sickles   Spinal stenosis of lumbar region 08/07/2020   Thyroid  nodule    TIA (transient ischemic attack)    Type 2  diabetes mellitus with microalbuminuria, without long-term current use of insulin    (CMD) 04/28/2022   A1C 6.4 on 10/14/2022   Vitamin D  deficiency 05/14/2021     PSH: Past Surgical History:  Procedure Laterality Date   CARDIAC ELECTROPHYSIOLOGY PROCEDURE N/A 01/23/2024   CV IMPLANT LOOP RECORDER performed by Lauraine Almarie Elnor Athena, PA-C at [facility] EP Lab   CARPAL TUNNEL RELEASE Bilateral 1996   Procedure: CARPAL TUNNEL RELEASE   CATARACT EXTRACTION Right 11/02/2022   Procedure: YAG CAPSULOTOMY; Dr. Adina Sickles   CATARACT EXTRACTION W/  INTRAOCULAR LENS IMPLANT Right 09/16/2021   Procedure: PHACOEMULSIFICATION PC / IOL TOPICAL;  Surgeon: Donnice Sickles, MD;  Location: [facility] MAIN OR;  Service: Ophthalmology;  Laterality: Right;   CATARACT EXTRACTION W/  INTRAOCULAR LENS IMPLANT Left 01/05/2023   Procedure: PHACOEMULSIFICATION PC / IOL TOPICAL;  Surgeon: Sickles Donnice, MD;  Location: [phone] MAIN OR;  Service: Ophthalmology;  Laterality: Left;   COLONOSCOPY  2017   no problems   CYSTOSCOPY W/ LASER LITHOTRIPSY Right 09/27/2024   cystoscopy, right retrograde pyelogram, right ureteroscopy, right ureteral stent performed by Redell Lynwood Napoleon, MD at [facility] OR   CYSTOSCOPY W/ RETROGRADES Right 07/23/2024   cystoscopy, right ureteroscopy, right ureteral stent exchange performed by Redell Lynwood Napoleon, MD at [facility] OR   FEMUR FRACTURE SURGERY  12/18/2013   FEMUR SURGERY Left 12/15/2014   Procedure: FEMUR SURGERY   FLEXIBLE SIGMOIDOSCOPY  2001   i had reaction to medication   FOOT SURGERY  2014   Bone Spur removal   JOINT REPLACEMENT  04/19/2014   LASER RETINOPEXY Left 05/19/2016   Procedure: LASER RETINOPEXY   LASER RETINOPEXY Left 12/16/2016   Procedure: LASER RETINOPEXY   OTHER SURGICAL HISTORY  04/19/13   hip replacement   PARATHYROIDECTOMY Bilateral 12/27/2024   PARATHYROIDECTOMY performed by Lamar Tamar Billet II, MD at [facility] OR   RETINAL CRYOABLATION  Bilateral 01/15/2019   Procedure: RETINAL CRYOABLATION; Dr. Anselm Fairly   SEPTOPLASTY Bilateral 11/21/2023   SEPTOPLASTY WITH RESECTION TURBINATES performed by Bard Jakie Ill, MD at [facility] OR   SUPERFICIAL KERATECTOMY Right 04/26/2021   Procedure: SUPERFICIAL KERATECTOMY; Dr. Adina Sickles   SUPERFICIAL KERATECTOMY Left 11/01/2021   Procedure: SUPERFICIAL KERATECTOMY; Dr. Adina Sickles   TOTAL HIP ARTHROPLASTY Left 04/21/2015   Procedure: TOTAL HIP REPLACEMENT   TRANSURETHRAL RESECTION OF BLADDER TUMOR N/A 06/18/2024   CYSTOSCOPY TRANSURETHRAL RESECTION BLADDER TUMOR performed by Redell Lynwood Napoleon, MD at [facility] OR   TRANSURETHRAL RESECTION OF BLADDER TUMOR N/A 07/23/2024   CYSTOSCOPY TRANSURETHRAL RESECTION BLADDER TUMOR performed by Redell Lynwood Napoleon, MD at [facility] OR   TRANSURETHRAL RESECTION OF BLADDER TUMOR N/A 09/27/2024   POSSIBLE TRANSURETHRAL RESECTION BLADDER TUMOR performed by Redell Lynwood Napoleon, MD at [facility] OR       Medications: Current Outpatient Medications  Medication Sig Dispense Refill   amLODIPine (NORVASC) 10 mg tablet Take 1 tablet (10 mg total) by mouth daily. 90 tablet 3  azelastine  (ASTELIN ) 137 mcg (0.1 %) nasal spray Administer 1 spray into each nostril 2 (two) times a day. Use in each nostril as directed 30 mL 3   betamethasone dipropionate (DIPROLENE) 0.05 % ointment Apply to affected area twice daily as needed for eczema flares.     cholecalciferol  (Vitamin D3) 2,000 unit cap capsule Take 2,000 Units by mouth daily.     estradioL (ESTRACE) 0.01 % (0.1 mg/gram) vaginal cream Apply pea-sized amount intravaginally 3 times per week at night 42.5 g 3   evolocumab (Repatha SureClick) 140 mg/mL pnij Inject 1 mL (140 mg total) under the skin every 14 (fourteen) days. 6 mL 5   folic acid (FOLVITE) 1 mg tablet Take 1 mg by mouth daily.     levETIRAcetam (KEPPRA) 500 mg tablet Take 1 tablet (500 mg total) by mouth 2 (two) times a day Indications: additional  medication to treat partial seizures. 180 tablet 3   levothyroxine  (Synthroid ) 100 mcg tablet 1 tablet by mouth six days a week, 1/2 (one-half) tablet by mouth one day a week. 84 tablet 4   mirabegron (Myrbetriq) 25 mg Tb24 Take 1 tablet (25 mg total) by mouth daily. 90 tablet 3   ondansetron  (ZOFRAN -ODT) 8 mg disintegrating tablet Dissolve 1 tablet (8 mg total) on tongue every 8 (eight) hours as needed for nausea or vomiting. 60 tablet 0   romosozumab-aqqg (Evenity) 105 mg/1.17 mL injection Inject under the skin once.     telmisartan (MICARDIS) 80 mg tab tablet TAKE 1 TABLET DAILY (Patient taking differently: Take 80 mg by mouth daily.) 90 tablet 3   tiZANidine (ZANAFLEX) 4 mg tablet Take 1 tablet by mouth in the morning and 1 tablet at noon and 1 tablet in the evening. Take with meals.     Current Facility-Administered Medications  Medication Dose Route Frequency Provider Last Rate Last Admin   gentamicin (GARAMYCIN) injection 80 mg  80 mg intramuscular Once Redell Lynwood Napoleon, MD        Allergies: Alirocumab, Egg extract, Peanut, Soybean, Peanut oil, Shellfish containing products, Soy, Rosuvastatin calcium, Egg, Gluten, Shrimp, and Statins-hmg-coa reductase inhibitors   Social History: Patient  reports that she has never smoked. She has never used smokeless tobacco. She reports that she does not currently use alcohol. She reports that she does not use drugs.   Family History: The patient's family history includes Diabetes in her maternal grandmother and mother; Heart disease in her father, maternal grandfather, paternal grandfather, and paternal grandmother; Hyperlipidemia in her mother; Hypertension in her father and mother; Osteoporosis in her mother and sister; Sleep apnea in her sister; Stroke in her father; Thyroid  disease in her mother and sister.  Review of Systems: A complete review of systems was obtained including: Constitutional, Eyes, ENT, Cardiovascular, Respiratory, GI,  GU, Musculoskeletal, Skin, Neurological, Psychiatric, Endocrine, Heme/Lymphatic, and Allergic/Immunologic systems. It is negative or non-contributory to the patients management except for as stated in this note.   Physical Exam: There were no vitals taken for this visit. GENERAL APPEARANCE:  Well appearing, well developed, well nourished, NAD HEENT: Atraumatic, Normocephalic NECK: Supple without lymphadenopathy or masses. LUNGS: Clear to auscultation bilaterally, Normal  respiratory effort HEART: no obvious cyanosis ABDOMEN: Soft, non-tender, No Masses. EXTREMITIES: Moves all extremities well.  Without edema. NEUROLOGIC:  Alert and oriented x 3, CN II-XII grossly intact.  MENTAL STATUS:  Appropriate. BACK:  Non-tender to palpation.  No CVAT SKIN:  Warm, dry and intact.   GENT:  Results:   Urinalysis and  laboratory results, if present, reviewed and analyzed. No results found for this or any previous visit (from the past 24 hours).

## 2025-01-20 NOTE — Progress Notes (Unsigned)
 [facility] CONSULT NOTE  Patient Care Team: Patient, No Pcp Per as PCP - General (General Practice)  ASSESSMENT & PLAN:  [name] is a 74 y.o.female with history of gross hematuria and recurrent UTIs, NMIBC s/p TURBT (6/25), BCG x 6, right double-J ureteral stent for right hydronephrosis, Carotid artery occlusion, DDD, L1 fracture, HLD, parathyroid adenoma s/p parathyroidectomy, meningioma  [facility].  Primary Urologist: Dr. Redell Lynwood Napoleon. Diagnosis: NMIBC. AUA high risk HG T1/TIS. S/p BCG induction   Discussed various stages, management regarding bladder cancer. She is at stage I and currently undergoing standard of care treatment. Given AUA high risk, BCG induction plus maintenance is the category 1 preferred treatment. BCG therapy consists of 6 weekly instillations for induction, initiated 3-4 weeks after TURBT, followed by maintenance therapy is recommended. Continue follow up with her Urologist.  Report of persistent LLQ/groin pain that is ongoing. Not related to her chronic back pain. Will obtain CT for evaluation. Assessment & Plan Malignant neoplasm of urinary bladder, unspecified site [facility]) Continue follow up with Urology Recommend ongoing cystoscopy surveillance Next cysto in March Left lower quadrant abdominal pain of unknown etiology CT scan ordered Return to discuss result next months  Orders Placed This Encounter  Procedures   CT ABDOMEN PELVIS W WO CONTRAST    Standing Status:   Future    Expected Date:   02/04/2025    Expiration Date:   01/21/2026    If indicated for the ordered procedure, I authorize the administration of contrast media per Radiology protocol:   Yes    Does the patient have a contrast media/X-ray dye allergy?:   No    Preferred imaging location?:   [facility]    If indicated for the ordered procedure, I authorize the administration of oral contrast media per Radiology protocol:    Yes   CBC with Differential (Cancer Center Only)    Standing Status:   Future    Expiration Date:   01/21/2026   CMP (Cancer Center only)    Standing Status:   Future    Expiration Date:   01/21/2026   All questions were answered. The patient knows to call the clinic with any problems, questions or concerns. No barriers to learning was detected.  [name], MD 1/27/202612:50 PM  CHIEF COMPLAINTS/PURPOSE OF CONSULTATION:  Bladder cancer  HISTORY OF PRESENTING ILLNESS:  [name] 74 y.o. female is here because of bladder cancer. I have reviewed her chart and materials related to her cancer extensively and collaborated history with the patient. Summary of oncologic history is as follows:  Bladder Cancer History:  Patient report having hematuria starting August 2024 and multiple UTIs.  She was referred to urology for evaluation.  She was subsequently found to have bladder cancer TURBT.  She was started on BCG induction and just finished #6 on January 17 2025.  There were delays from UTI.  She has groin pain after BCG and urinary frequency.  06/18/24 TURBT  pT1 HG UC - focal invasion. Muscle present and not involved Final Diagnosis   BLADDER TUMOR, TRANSURETHRAL RESECTION:              Invasive high grade papillary urothelial carcinoma.               Tumor is focally invading into lamina propria pT1.               Smooth muscle is present.  07/23/24 TURBT Small foci of CIS. No invasive carcinoma Final Diagnosis   BLADDER, TRANSURETHRAL RESECTION:              Small scattered foci of urothelial carcinoma in situ.               Negative for invasive carcinoma.               Smooth muscle present.  Background shows extensive necrosis, acute inflammation and reactive changes.    09/27/24 TURBT Final Diagnosis   BLADDER, TRANSURETHRAL RESECTION:              ABUNDANT NECROSIS WITH DYSTROPHIC CALCIFICATIONS AND RARE FRAGMENTS              OF UROTHELIUM WITH MARKED  INFLAMMATION.              NO EVIDENCE OF MALIGNANCY.    MEDICAL HISTORY:  Past Medical History:  Diagnosis Date   Arthritis    Bilateral in the Knees and fingers (04/22/2015)   Cancer (HCC)    Chickenpox    Complication of anesthesia    hard to wake up:   Diabetes mellitus without complication (HCC)    Environmental allergies    Family history of adverse reaction to anesthesia    sister & mom are hard to wake up:   Food allergy    Peanuts, eggs, shrimp/shellfish, wheat, soy   Genital warts    Heart murmur    HTN (hypertension)    Hyperlipidemia    Hypothyroidism    Implantable loop recorder present    Stroke (HCC)    Thyroid  disease    Thyroid  nodule     SURGICAL HISTORY: Past Surgical History:  Procedure Laterality Date   CARPAL TUNNEL RELEASE Bilateral 1990's   CYSTECTOMY     From the cervix   FRACTURE SURGERY     INTRAMEDULLARY (IM) NAIL INTERTROCHANTERIC Left 12/15/2014   Procedure: INTRAMEDULLARY (IM) NAIL INTERTROCHANTRIC;  Surgeon: Elspeth JONELLE Her, MD;  Location: [facility] ORS;  Service: Orthopedics;  Laterality: Left;   IR KYPHO LUMBAR INC FX REDUCE BONE BX UNI/BIL CANNULATION INC/IMAGING  05/22/2024   KYPHOPLASTY N/A 11/19/2024   Procedure: KYPHOPLASTY OF LUMBAR ONE, THREE, AND FOUR;  Surgeon: Reyne Cordella SQUIBB, MD;  Location: [facility] OR;  Service: Orthopedics;  Laterality: N/A;  L1, L3, L4   OTHER SURGICAL HISTORY Left 12/2016   Bone spur on foot   TOTAL HIP ARTHROPLASTY Left 04/24/2015   Procedure:  TOTAL HIP ARTHROPLASTY AND REMOVAL OF IM AFFIXUS NAIL ;  Surgeon: Donnice Car, MD;  Location: [facility] OR;  Service: Orthopedics;  Laterality: Left;    SOCIAL HISTORY: Social History   Socioeconomic History   Marital status: Married    Spouse name: Not on file   Number of children: Not on file   Years of education: Not on file   Highest education level: Not on file  Occupational History   Not on file  Tobacco Use   Smoking status: Never   Smokeless tobacco: Never   Substance and Sexual Activity   Alcohol use: Yes    Comment: 04/22/2015 a gtlass of wine maybe a couple times/month   Drug use: No   Sexual activity: Yes    Partners: Male  Other Topics Concern   Not on file  Social History Narrative   Exercise-- walk-- 4-7 x a week   Social Drivers of Health   Tobacco Use: Low Risk (01/17/2025)   Received from [facility]  Patient History    Smoking Tobacco Use: Never    Smokeless Tobacco Use: Never    Passive Exposure: Not on file  Financial Resource Strain: Low Risk (12/27/2024)   Received from [facility]   Overall Financial Resource Strain (CARDIA)    How hard is it for you to pay for the very basics like food, housing, medical care, and heating?: Not hard at all  Food Insecurity: No Food Insecurity (01/21/2025)   Epic    Worried About Radiation Protection Practitioner of Food in the Last Year: Never true    Ran Out of Food in the Last Year: Never true  Transportation Needs: No Transportation Needs (01/21/2025)   Epic    Lack of Transportation (Medical): No    Lack of Transportation (Non-Medical): No  Physical Activity: Sufficiently Active (12/01/2022)   Received from [facility]   Exercise Vital Sign    Days of Exercise per Week: 5 days    Minutes of Exercise per Session: 30 min  Stress: No Stress Concern Present (07/26/2022)   Received from [facility] of Occupational Health - Occupational Stress Questionnaire    Feeling of Stress : Not at all  Social Connections: Unknown (12/27/2024)   Received from [facility]   Social Connection and Isolation Panel    In a typical week, how many times do you talk on the phone with family, friends, or neighbors?: More than three times a week    Frequency of Social Gatherings with Friends and Family: Not on file    Attends Religious Services: Not on file    Active Member of Clubs or Organizations: Not on file    Attends Banker Meetings: Not on file    Marital Status: Not on file   Intimate Partner Violence: Not At Risk (01/21/2025)   Epic    Fear of Current or Ex-Partner: No    Emotionally Abused: No    Physically Abused: No    Sexually Abused: No  Depression (PHQ2-9): Low Risk (01/21/2025)   Depression (PHQ2-9)    PHQ-2 Score: 0  Alcohol Screen: Not on file  Housing: Low Risk (01/21/2025)   Epic    Unable to Pay for Housing in the Last Year: No    Number of Times Moved in the Last Year: 0    Homeless in the Last Year: No  Utilities: Not At Risk (01/21/2025)   Epic    Threatened with loss of utilities: No  Health Literacy: Not on file    FAMILY HISTORY: Family History  Problem Relation Age of Onset   Arthritis Mother    Hyperlipidemia Mother    High blood pressure Mother    Diabetes Mother    Myasthenia gravis Mother    Heart disease Father    Stroke Father        x's 2   High blood pressure Father    Diabetes Maternal Grandmother    Heart disease Paternal Grandmother    Heart disease Paternal Grandfather     ALLERGIES:  is allergic to alirocumab, gluten meal, peanut-containing drug products, soy allergy (obsolete), statins, egg protein-containing drug products, and shrimp [shellfish allergy].  MEDICATIONS:  Current Outpatient Medications  Medication Sig Dispense Refill   acetaminophen  (TYLENOL ) 325 MG tablet Take 2 tablets (650 mg total) by mouth every 6 (six) hours as needed. 60 tablet 1   amLODipine (NORVASC) 10 MG tablet Take 10 mg by mouth daily.     aspirin EC 325 MG  tablet Take 325 mg by mouth daily.     azelastine  (ASTELIN ) 0.1 % nasal spray Place 1 spray into both nostrils 2 (two) times daily. Use in each nostril as directed 90 mL 3   buprenorphine (BUTRANS) 5 MCG/HR PTWK 1 patch once a week.     Cholecalciferol  (VITAMIN D ) 50 MCG (2000 UT) tablet Take 2,000 Units by mouth daily.     estradiol (ESTRACE) 0.01 % CREA vaginal cream Place 1 Applicatorful vaginally 3 (three) times a week.     Evolocumab (REPATHA SURECLICK) 140 MG/ML SOAJ  Inject 140 mg into the skin every 14 (fourteen) days.     folic acid (FOLVITE) 1 MG tablet Take 1 mg by mouth daily.     levETIRAcetam (KEPPRA) 500 MG tablet Take 500 mg by mouth 2 (two) times daily.     levothyroxine  (SYNTHROID , LEVOTHROID) 100 MCG tablet Take 100 mcg by mouth See admin instructions. Take 100 mcg by mouth daily except on Saturday take 50 mcg     mirabegron ER (MYRBETRIQ) 25 MG TB24 tablet Take 25 mg by mouth daily.     ondansetron  (ZOFRAN -ODT) 8 MG disintegrating tablet Take 8 mg by mouth.     Romosozumab-aqqg (EVENITY) 105 MG/1. SOSY injection Inject 210 mg into the skin every 30 (thirty) days.     telmisartan (MICARDIS) 80 MG tablet Take 80 mg by mouth daily.     tiZANidine (ZANAFLEX) 4 MG tablet Take 4 mg by mouth 3 (three) times daily.     No current facility-administered medications for this visit.    REVIEW OF SYSTEMS:   All relevant systems were reviewed with the patient and are negative.  PHYSICAL EXAMINATION: ECOG PERFORMANCE STATUS: 1  Vitals:   01/21/25 1128  BP: (!) 151/65  Pulse: 86  Resp: 17  Temp: (!) 97.5 F (36.4 C)  SpO2: 97%   Filed Weights   01/21/25 1128  Weight: 164 lb 6.4 oz (74.6 kg)    GENERAL: alert, no distress and comfortable SKIN: skin color is normal LUNGS: Effort normal, no respiratory distress.   ABDOMEN: soft, non-tender and nondistended   LABORATORY DATA:  I have reviewed the data as listed Lab Results  Component Value Date   WBC 12.9 (H) 11/13/2024   HGB 12.5 11/13/2024   HCT 38.9 11/13/2024   MCV 88.8 11/13/2024   PLT 429 (H) 11/13/2024   Recent Labs    05/22/24 0723 11/13/24 1500  NA 134* 138  K 4.6 5.0  CL 105 99  CO2 25 28  GLUCOSE 146* 153*  BUN 26* 16  CREATININE 0.93 1.17*  CALCIUM 9.0 12.5*  GFRNONAA >60 50*    RADIOGRAPHIC STUDIES: I have personally reviewed the radiological images as listed and agreed with the findings in the report.

## 2025-01-21 ENCOUNTER — Inpatient Hospital Stay

## 2025-01-21 VITALS — BP 151/65 | HR 86 | Temp 97.5°F | Resp 17 | Wt 164.4 lb

## 2025-01-21 DIAGNOSIS — Z8349 Family history of other endocrine, nutritional and metabolic diseases: Secondary | ICD-10-CM

## 2025-01-21 DIAGNOSIS — Z833 Family history of diabetes mellitus: Secondary | ICD-10-CM

## 2025-01-21 DIAGNOSIS — Z86011 Personal history of benign neoplasm of the brain: Secondary | ICD-10-CM | POA: Diagnosis not present

## 2025-01-21 DIAGNOSIS — Z91010 Allergy to peanuts: Secondary | ICD-10-CM | POA: Diagnosis not present

## 2025-01-21 DIAGNOSIS — Z8249 Family history of ischemic heart disease and other diseases of the circulatory system: Secondary | ICD-10-CM

## 2025-01-21 DIAGNOSIS — Z86018 Personal history of other benign neoplasm: Secondary | ICD-10-CM | POA: Insufficient documentation

## 2025-01-21 DIAGNOSIS — Z91012 Allergy to eggs, unspecified: Secondary | ICD-10-CM | POA: Diagnosis not present

## 2025-01-21 DIAGNOSIS — Z87440 Personal history of urinary (tract) infections: Secondary | ICD-10-CM

## 2025-01-21 DIAGNOSIS — Z888 Allergy status to other drugs, medicaments and biological substances status: Secondary | ICD-10-CM | POA: Insufficient documentation

## 2025-01-21 DIAGNOSIS — R1032 Left lower quadrant pain: Secondary | ICD-10-CM | POA: Insufficient documentation

## 2025-01-21 DIAGNOSIS — Z8261 Family history of arthritis: Secondary | ICD-10-CM

## 2025-01-21 DIAGNOSIS — Z7982 Long term (current) use of aspirin: Secondary | ICD-10-CM | POA: Insufficient documentation

## 2025-01-21 DIAGNOSIS — Z8673 Personal history of transient ischemic attack (TIA), and cerebral infarction without residual deficits: Secondary | ICD-10-CM

## 2025-01-21 DIAGNOSIS — Z820 Family history of epilepsy and other diseases of the nervous system: Secondary | ICD-10-CM | POA: Diagnosis not present

## 2025-01-21 DIAGNOSIS — Z83438 Family history of other disorder of lipoprotein metabolism and other lipidemia: Secondary | ICD-10-CM | POA: Insufficient documentation

## 2025-01-21 DIAGNOSIS — Z91018 Allergy to other foods: Secondary | ICD-10-CM

## 2025-01-21 DIAGNOSIS — C679 Malignant neoplasm of bladder, unspecified: Secondary | ICD-10-CM | POA: Insufficient documentation

## 2025-01-21 DIAGNOSIS — Z823 Family history of stroke: Secondary | ICD-10-CM

## 2025-01-21 DIAGNOSIS — I10 Essential (primary) hypertension: Secondary | ICD-10-CM | POA: Diagnosis not present

## 2025-01-21 DIAGNOSIS — Z79899 Other long term (current) drug therapy: Secondary | ICD-10-CM | POA: Diagnosis not present

## 2025-01-21 LAB — CBC WITH DIFFERENTIAL (CANCER CENTER ONLY)
Abs Immature Granulocytes: 0.12 10*3/uL — ABNORMAL HIGH (ref 0.00–0.07)
Basophils Absolute: 0.1 10*3/uL (ref 0.0–0.1)
Basophils Relative: 1 %
Eosinophils Absolute: 0.7 10*3/uL — ABNORMAL HIGH (ref 0.0–0.5)
Eosinophils Relative: 8 %
HCT: 33.7 % — ABNORMAL LOW (ref 36.0–46.0)
Hemoglobin: 10.9 g/dL — ABNORMAL LOW (ref 12.0–15.0)
Immature Granulocytes: 1 %
Lymphocytes Relative: 20 %
Lymphs Abs: 1.9 10*3/uL (ref 0.7–4.0)
MCH: 28.2 pg (ref 26.0–34.0)
MCHC: 32.3 g/dL (ref 30.0–36.0)
MCV: 87.1 fL (ref 80.0–100.0)
Monocytes Absolute: 0.8 10*3/uL (ref 0.1–1.0)
Monocytes Relative: 8 %
Neutro Abs: 5.9 10*3/uL (ref 1.7–7.7)
Neutrophils Relative %: 62 %
Platelet Count: 378 10*3/uL (ref 150–400)
RBC: 3.87 MIL/uL (ref 3.87–5.11)
RDW: 12.5 % (ref 11.5–15.5)
WBC Count: 9.6 10*3/uL (ref 4.0–10.5)
nRBC: 0 % (ref 0.0–0.2)

## 2025-01-21 LAB — CMP (CANCER CENTER ONLY)
ALT: 43 U/L (ref 0–44)
AST: 24 U/L (ref 15–41)
Albumin: 4.1 g/dL (ref 3.5–5.0)
Alkaline Phosphatase: 106 U/L (ref 38–126)
Anion gap: 10 (ref 5–15)
BUN: 25 mg/dL — ABNORMAL HIGH (ref 8–23)
CO2: 27 mmol/L (ref 22–32)
Calcium: 9.4 mg/dL (ref 8.9–10.3)
Chloride: 104 mmol/L (ref 98–111)
Creatinine: 1.11 mg/dL — ABNORMAL HIGH (ref 0.44–1.00)
GFR, Estimated: 53 mL/min — ABNORMAL LOW
Glucose, Bld: 103 mg/dL — ABNORMAL HIGH (ref 70–99)
Potassium: 4.4 mmol/L (ref 3.5–5.1)
Sodium: 140 mmol/L (ref 135–145)
Total Bilirubin: <0.2 mg/dL (ref 0.0–1.2)
Total Protein: 7.2 g/dL (ref 6.5–8.1)

## 2025-01-21 NOTE — Assessment & Plan Note (Addendum)
 Continue follow up with Urology Recommend ongoing cystoscopy surveillance Next cysto in March

## 2025-01-31 ENCOUNTER — Telehealth: Payer: Self-pay | Admitting: *Deleted

## 2025-01-31 NOTE — Telephone Encounter (Signed)
 RN called to request CT be loaded into our system on 01/22/25.   Called again and had to leave message to call us  to see how we can get this loaded.

## 2025-02-05 ENCOUNTER — Ambulatory Visit (HOSPITAL_COMMUNITY)

## 2025-02-14 ENCOUNTER — Inpatient Hospital Stay
